# Patient Record
Sex: Male | Born: 1960
Health system: Southern US, Community
[De-identification: ages and names within clinical notes are randomized; demographics above are authoritative.]

## PROBLEM LIST (undated history)

## (undated) DIAGNOSIS — IMO0001 Reserved for inherently not codable concepts without codable children: Secondary | ICD-10-CM

## (undated) DIAGNOSIS — F419 Anxiety disorder, unspecified: Secondary | ICD-10-CM

## (undated) DIAGNOSIS — Z21 Asymptomatic human immunodeficiency virus [HIV] infection status: Secondary | ICD-10-CM

## (undated) DIAGNOSIS — R0600 Dyspnea, unspecified: Secondary | ICD-10-CM

## (undated) DIAGNOSIS — D649 Anemia, unspecified: Secondary | ICD-10-CM

## (undated) DIAGNOSIS — F32A Depression, unspecified: Secondary | ICD-10-CM

## (undated) DIAGNOSIS — W3400XA Accidental discharge from unspecified firearms or gun, initial encounter: Secondary | ICD-10-CM

## (undated) DIAGNOSIS — S7291XA Unspecified fracture of right femur, initial encounter for closed fracture: Secondary | ICD-10-CM

## (undated) DIAGNOSIS — L03039 Cellulitis of unspecified toe: Secondary | ICD-10-CM

## (undated) DIAGNOSIS — G009 Bacterial meningitis, unspecified: Secondary | ICD-10-CM

## (undated) DIAGNOSIS — I639 Cerebral infarction, unspecified: Secondary | ICD-10-CM

## (undated) DIAGNOSIS — I38 Endocarditis, valve unspecified: Secondary | ICD-10-CM

## (undated) DIAGNOSIS — B20 Human immunodeficiency virus [HIV] disease: Secondary | ICD-10-CM

## (undated) DIAGNOSIS — A4902 Methicillin resistant Staphylococcus aureus infection, unspecified site: Secondary | ICD-10-CM

## (undated) DIAGNOSIS — R05 Cough: Secondary | ICD-10-CM

## (undated) DIAGNOSIS — R21 Rash and other nonspecific skin eruption: Secondary | ICD-10-CM

## (undated) DIAGNOSIS — L02619 Cutaneous abscess of unspecified foot: Secondary | ICD-10-CM

## (undated) HISTORY — PX: FRACTURE SURGERY: SHX138

## (undated) HISTORY — PX: LEG SURGERY: SHX1003

## (undated) HISTORY — DX: Rash and other nonspecific skin eruption: R21

## (undated) HISTORY — DX: Cough: R05

## (undated) HISTORY — DX: Methicillin resistant Staphylococcus aureus infection, unspecified site: A49.02

## (undated) HISTORY — DX: Reserved for inherently not codable concepts without codable children: IMO0001

---

## 2007-08-06 LAB — CONVERTED CEMR LAB
CD4 Count: 1882 microliters
CD4 T Helper %: 36.9 %

## 2008-02-14 LAB — CONVERTED CEMR LAB
CD4 T Helper %: 1494 %
HIV 1 RNA Quant: <48 copies/mL
Platelets: 207 10*3/uL
WBC: 7.7 10*3/uL

## 2008-07-31 LAB — CONVERTED CEMR LAB: Platelets: 243 10*3/uL

## 2008-08-25 ENCOUNTER — Ambulatory Visit: Payer: Self-pay | Admitting: Infectious Disease

## 2008-08-25 DIAGNOSIS — B009 Herpesviral infection, unspecified: Secondary | ICD-10-CM

## 2008-08-25 DIAGNOSIS — B20 Human immunodeficiency virus [HIV] disease: Secondary | ICD-10-CM

## 2008-08-25 LAB — CONVERTED CEMR LAB
AST: 28 units/L (ref 0–37)
Albumin: 4.4 g/dL (ref 3.5–5.2)
Alkaline Phosphatase: 62 units/L (ref 39–117)
BUN: 16 mg/dL (ref 6–23)
Bilirubin Urine: NEGATIVE
Eosinophils Absolute: 0.1 10*3/uL (ref 0.0–0.7)
Eosinophils Relative: 2 % (ref 0–5)
GC Probe Amp, Urine: NEGATIVE
HCT: 39.7 % (ref 39.0–52.0)
HDL: 34 mg/dL — ABNORMAL LOW (ref >39)
HIV 1 RNA Quant: 135 copies/mL — ABNORMAL HIGH (ref <48)
HIV-1 antibody: POSITIVE — AB
HIV-2 Ab: UNDETERMINED — AB
Hemoglobin: 13.6 g/dL (ref 13.0–17.0)
Hepatitis B Surface Ag: NEGATIVE
LDL Cholesterol: 9 mg/dL (ref 0–99)
Lymphocytes Relative: 45 % (ref 12–46)
Lymphs Abs: 3.5 10*3/uL (ref 0.7–4.0)
MCV: 104.5 fL — ABNORMAL HIGH (ref 78.0–100.0)
Monocytes Absolute: 0.4 10*3/uL (ref 0.1–1.0)
Nitrite: NEGATIVE
Platelets: 287 10*3/uL (ref 150–400)
Potassium: 4.4 meq/L (ref 3.5–5.3)
Protein, ur: NEGATIVE mg/dL
Sodium: 143 meq/L (ref 135–145)
Specific Gravity, Urine: 1.03 (ref 1.005–1.030)
Total Bilirubin: 0.4 mg/dL (ref 0.3–1.2)
Total CHOL/HDL Ratio: 2.8
Total Protein: 6.9 g/dL (ref 6.0–8.3)
Triglycerides: 259 mg/dL — ABNORMAL HIGH (ref <150)
Urobilinogen, UA: 0.2 (ref 0.0–1.0)
VLDL: 52 mg/dL — ABNORMAL HIGH (ref 0–40)
WBC: 7.8 10*3/uL (ref 4.0–10.5)

## 2008-09-23 ENCOUNTER — Encounter (INDEPENDENT_AMBULATORY_CARE_PROVIDER_SITE_OTHER): Payer: Self-pay | Admitting: *Deleted

## 2008-09-29 ENCOUNTER — Ambulatory Visit: Payer: Self-pay | Admitting: Infectious Disease

## 2008-09-29 DIAGNOSIS — F17200 Nicotine dependence, unspecified, uncomplicated: Secondary | ICD-10-CM | POA: Insufficient documentation

## 2008-09-29 LAB — CONVERTED CEMR LAB
AST: 23 units/L (ref 0–37)
Albumin: 4.6 g/dL (ref 3.5–5.2)
Alkaline Phosphatase: 57 units/L (ref 39–117)
Basophils Absolute: 0 10*3/uL (ref 0.0–0.1)
Basophils Relative: 0 % (ref 0–1)
Eosinophils Absolute: 0.1 10*3/uL (ref 0.0–0.7)
Glucose, Bld: 102 mg/dL — ABNORMAL HIGH (ref 70–99)
HIV 1 RNA Quant: 14500 copies/mL — ABNORMAL HIGH (ref <48)
HIV-1 RNA Quant, Log: 4.16 — ABNORMAL HIGH (ref <1.68)
Hemoglobin: 14.8 g/dL (ref 13.0–17.0)
MCHC: 32.6 g/dL (ref 30.0–36.0)
MCV: 103.7 fL — ABNORMAL HIGH (ref 78.0–100.0)
Monocytes Absolute: 0.7 10*3/uL (ref 0.1–1.0)
Monocytes Relative: 10 % (ref 3–12)
Neutrophils Relative %: 53 % (ref 43–77)
Potassium: 4 meq/L (ref 3.5–5.3)
RBC: 4.38 M/uL (ref 4.22–5.81)
RDW: 13.5 % (ref 11.5–15.5)
Sodium: 140 meq/L (ref 135–145)
Total Bilirubin: 0.3 mg/dL (ref 0.3–1.2)
Total CHOL/HDL Ratio: 2.2
Total Protein: 6.9 g/dL (ref 6.0–8.3)
VLDL: 10 mg/dL (ref 0–40)

## 2008-11-17 ENCOUNTER — Ambulatory Visit: Payer: Self-pay | Admitting: Infectious Disease

## 2008-11-17 LAB — CONVERTED CEMR LAB
ALT: 20 units/L (ref 0–53)
AST: 25 units/L (ref 0–37)
Albumin: 4.5 g/dL (ref 3.5–5.2)
Alkaline Phosphatase: 71 units/L (ref 39–117)
BUN: 21 mg/dL (ref 6–23)
Basophils Absolute: 0 10*3/uL (ref 0.0–0.1)
Basophils Relative: 0 % (ref 0–1)
CO2: 22 meq/L (ref 19–32)
Calcium: 9.3 mg/dL (ref 8.4–10.5)
Chloride: 105 meq/L (ref 96–112)
Creatinine, Ser: 1.01 mg/dL (ref 0.40–1.50)
Eosinophils Absolute: 0.1 10*3/uL (ref 0.0–0.7)
Eosinophils Relative: 1 % (ref 0–5)
Glucose, Bld: 87 mg/dL (ref 70–99)
HCT: 40.2 % (ref 39.0–52.0)
Hemoglobin: 14.5 g/dL (ref 13.0–17.0)
Lymphocytes Relative: 41 % (ref 12–46)
Lymphs Abs: 3.6 10*3/uL (ref 0.7–4.0)
MCHC: 36.1 g/dL — ABNORMAL HIGH (ref 30.0–36.0)
MCV: 94.1 fL (ref >=78.0)
Monocytes Absolute: 0.5 10*3/uL (ref 0.1–1.0)
Monocytes Relative: 6 % (ref 3–12)
Neutro Abs: 4.5 10*3/uL (ref 1.7–7.7)
Neutrophils Relative %: 52 % (ref 43–77)
Platelets: 233 10*3/uL (ref 150–400)
Potassium: 4 meq/L (ref 3.5–5.3)
RBC: 4.27 M/uL (ref 4.22–5.81)
RDW: 13.6 % (ref 11.5–15.5)
Sodium: 142 meq/L (ref 135–145)
Total Bilirubin: 0.3 mg/dL (ref 0.3–1.2)
Total Protein: 7.1 g/dL (ref 6.0–8.3)
WBC: 8.7 10*3/uL (ref 4.0–10.5)

## 2008-12-01 ENCOUNTER — Encounter: Payer: Self-pay | Admitting: Licensed Clinical Social Worker

## 2008-12-01 ENCOUNTER — Ambulatory Visit: Payer: Self-pay | Admitting: Infectious Disease

## 2008-12-01 DIAGNOSIS — IMO0001 Reserved for inherently not codable concepts without codable children: Secondary | ICD-10-CM

## 2008-12-01 HISTORY — DX: Reserved for inherently not codable concepts without codable children: IMO0001

## 2009-03-26 ENCOUNTER — Ambulatory Visit: Payer: Self-pay | Admitting: Infectious Disease

## 2009-03-27 ENCOUNTER — Encounter: Payer: Self-pay | Admitting: Infectious Disease

## 2009-03-27 LAB — CONVERTED CEMR LAB
AST: 17 units/L (ref 0–37)
BUN: 12 mg/dL (ref 6–23)
Basophils Relative: 0 % (ref 0–1)
CO2: 26 meq/L (ref 19–32)
Calcium: 9.2 mg/dL (ref 8.4–10.5)
Chloride: 108 meq/L (ref 96–112)
Cholesterol: 99 mg/dL (ref 0–200)
Creatinine, Ser: 1.01 mg/dL (ref 0.40–1.50)
Eosinophils Absolute: 0.1 10*3/uL (ref 0.0–0.7)
Eosinophils Relative: 1 % (ref 0–5)
HCT: 45.1 % (ref 39.0–52.0)
HDL: 43 mg/dL (ref >39)
Lymphs Abs: 3.8 10*3/uL (ref 0.7–4.0)
MCHC: 32.8 g/dL (ref 30.0–36.0)
MCV: 95.8 fL (ref >=78.0)
Platelets: 267 10*3/uL (ref 150–400)
Total Bilirubin: 0.3 mg/dL (ref 0.3–1.2)
Total CHOL/HDL Ratio: 2.3
VLDL: 52 mg/dL — ABNORMAL HIGH (ref 0–40)
WBC: 8.8 10*3/uL (ref 4.0–10.5)

## 2009-04-09 ENCOUNTER — Ambulatory Visit: Payer: Self-pay | Admitting: Infectious Disease

## 2009-04-22 ENCOUNTER — Encounter: Payer: Self-pay | Admitting: Infectious Disease

## 2009-05-27 ENCOUNTER — Encounter (INDEPENDENT_AMBULATORY_CARE_PROVIDER_SITE_OTHER): Payer: Self-pay | Admitting: *Deleted

## 2009-07-09 ENCOUNTER — Ambulatory Visit: Payer: Self-pay | Admitting: Infectious Disease

## 2009-07-09 LAB — CONVERTED CEMR LAB
BUN: 15 mg/dL (ref 6–23)
Basophils Relative: 0 % (ref 0–1)
CO2: 25 meq/L (ref 19–32)
Cholesterol: 113 mg/dL (ref 0–200)
Creatinine, Ser: 1.1 mg/dL (ref 0.40–1.50)
Eosinophils Absolute: 0.1 10*3/uL (ref 0.0–0.7)
Eosinophils Relative: 1 % (ref 0–5)
Glucose, Bld: 117 mg/dL — ABNORMAL HIGH (ref 70–99)
Hemoglobin: 14 g/dL (ref 13.0–17.0)
MCHC: 32.6 g/dL (ref 30.0–36.0)
MCV: 93.9 fL (ref 78.0–100.0)
Monocytes Absolute: 0.3 10*3/uL (ref 0.1–1.0)
Monocytes Relative: 3 % (ref 3–12)
RBC: 4.58 M/uL (ref 4.22–5.81)
Total Bilirubin: 0.2 mg/dL — ABNORMAL LOW (ref 0.3–1.2)
Total Protein: 6.6 g/dL (ref 6.0–8.3)
Triglycerides: 105 mg/dL (ref <150)
VLDL: 21 mg/dL (ref 0–40)

## 2009-07-20 ENCOUNTER — Ambulatory Visit: Payer: Self-pay | Admitting: Infectious Disease

## 2009-08-25 ENCOUNTER — Encounter (INDEPENDENT_AMBULATORY_CARE_PROVIDER_SITE_OTHER): Payer: Self-pay | Admitting: *Deleted

## 2009-10-19 ENCOUNTER — Telehealth (INDEPENDENT_AMBULATORY_CARE_PROVIDER_SITE_OTHER): Payer: Self-pay | Admitting: *Deleted

## 2009-12-08 ENCOUNTER — Ambulatory Visit: Payer: Self-pay | Admitting: Infectious Disease

## 2009-12-08 LAB — CONVERTED CEMR LAB
Alkaline Phosphatase: 82 units/L (ref 39–117)
BUN: 12 mg/dL (ref 6–23)
Creatinine, Ser: 0.87 mg/dL (ref 0.40–1.50)
Eosinophils Absolute: 0.1 10*3/uL (ref 0.0–0.7)
Eosinophils Relative: 1 % (ref 0–5)
Glucose, Bld: 103 mg/dL — ABNORMAL HIGH (ref 70–99)
HCT: 42.8 % (ref 39.0–52.0)
HDL: 53 mg/dL (ref >39)
LDL Cholesterol: 38 mg/dL (ref 0–99)
Lymphs Abs: 4 10*3/uL (ref 0.7–4.0)
MCV: 94.5 fL (ref 78.0–100.0)
Monocytes Relative: 6 % (ref 3–12)
Platelets: 272 10*3/uL (ref 150–400)
Sodium: 142 meq/L (ref 135–145)
Total Bilirubin: 0.3 mg/dL (ref 0.3–1.2)
Total CHOL/HDL Ratio: 2
Triglycerides: 75 mg/dL (ref <150)
VLDL: 15 mg/dL (ref 0–40)
WBC: 10.8 10*3/uL — ABNORMAL HIGH (ref 4.0–10.5)

## 2009-12-22 ENCOUNTER — Encounter: Payer: Self-pay | Admitting: Infectious Disease

## 2009-12-22 ENCOUNTER — Ambulatory Visit: Payer: Self-pay | Admitting: Internal Medicine

## 2010-04-06 NOTE — Letter (Signed)
Summary: Juanell Fairly: Income Verification  Juanell Fairly: Income Verification   Imported By: Florinda Marker 05/01/2009 16:07:44  _____________________________________________________________________  External Attachment:    Type:   Image     Comment:   External Document

## 2010-04-06 NOTE — Progress Notes (Signed)
Summary: [facility] can't reach pt to set up Aug delivery  Phone Note From Pharmacy   Caller: [facility] Summary of Call: received a phone call from [facility] trying to reach patient to set up their delivery for August medication.  The number we have in our system is the same number they have.  They said it just rings and you are unable to leave a message. Initial call taken by: Paulo Fruit  BS,CPht II,MPH,  October 19, 2009 12:17 PM

## 2010-04-06 NOTE — Assessment & Plan Note (Signed)
Summary: 3 MONTH RECHECK/CH   Visit Type:  Follow-up Primary Provider:  Paulette Blanch Dam MD  CC:  f/u.  History of Present Illness: 50 year old AA former prisoner, with HIV (dx in 1997)  perfectly suppressed on atripla and with healthy CD4 count. He denies being sexually active recently. 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 He denies any physical problems and feels his mood is "good" too. He is down to 7 cigarettes per day. He is currently living with his mother.   Problems Prior to Update: 1)  Inadequate Material Resources  (ICD-V60.2) 2)  Legal Circumstance  (ICD-V62.5) 3)  Encounter For Long-term Use of Other Medications  (ICD-V58.69) 4)  Smoker  (ICD-305.1) 5)  Hsv  (ICD-054.9) 6)  HIV Infection  (ICD-042)  Medications Prior to Update: 1)  Atripla 600-200-300 Mg Tabs (Efavirenz-Emtricitab-Tenofovir) .... Take 1 Tablet By Mouth Once A Day  Current Medications (verified): 1)  Atripla 600-200-300 Mg Tabs (Efavirenz-Emtricitab-Tenofovir) .... Take 1 Tablet By Mouth Once A Day  Allergies (verified): No Known Drug Allergies   Preventive Screening-Counseling & Management  Alcohol-Tobacco     Alcohol drinks/day: 1     Smoking Status: current     Packs/Day: 0.25      Drug Use:  No.     Current Allergies (reviewed today): No known allergies  Past History:  Past Medical History: Last updated: 10/26/08 HIV without AIDS per pt dx in 1997 started sustiva, combivir in 1999 HSV  Past Surgical History: Last updated: 10/26/2008 none  Family History: Last updated: 2008/10/26 Dad passed died two years ago from a cancer (throat) Mom healthy in 38s One brother and 3 sisters all healthy  Social History: Last updated: 04/09/2009 smoker 5 cigarettes per day, occ alcohol. Was incarcerated for murder (he claims was in self defense)  Risk Factors: Alcohol Use: 1 (07/20/2009) Caffeine Use: sodas (04/09/2009) Exercise: yes  (04/09/2009)  Risk Factors: Smoking Status: current (07/20/2009) Packs/Day: 0.25 (07/20/2009)  Review of Systems  The patient denies anorexia, fever, weight loss, weight gain, vision loss, decreased hearing, hoarseness, chest pain, syncope, dyspnea on exertion, peripheral edema, prolonged cough, headaches, hemoptysis, abdominal pain, melena, hematochezia, severe indigestion/heartburn, hematuria, incontinence, genital sores, muscle weakness, suspicious skin lesions, transient blindness, difficulty walking, depression, unusual weight change, abnormal bleeding, and enlarged lymph nodes.    Vital Signs:  Patient profile:   50 year old male Height:      67 inches (170.18 cm) Weight:      143 pounds (65 kg) BMI:     22.48 Temp:     98.5 degrees F (36.94 degrees C) oral BP sitting:   127 / 89  (left arm)  Vitals Entered By: Starleen Arms CMA (Jul 20, 2009 9:19 AM) CC: f/u Is Patient Diabetic? No Pain Assessment Patient in pain? no      Nutritional Status BMI of 19 -24 = normal Nutritional Status Detail nl  Does patient need assistance? Functional Status Self care Ambulation Normal   Physical Exam  General:  alert and well-developed.  well-nourished.   Head:  normocephalic and atraumatic.   Eyes:  vision grossly intact, pupils equal, and pupils round.   Ears:  no external deformities.   Nose:  no external deformity.  no external erythema.   Mouth:  pharynx pink and moist, no erythema, and no exudates.   Neck:  supple and full ROM.   Lungs:  normal respiratory effort, no crackles,  and no wheezes.   Heart:  normal rate, regular rhythm, no murmur, and no gallop.   Abdomen:  soft, non-tender, normal bowel sounds, no hepatomegaly, and no splenomegaly.   Extremities:  No clubbing, cyanosis, edema, or deformity noted with normal full range of motion of all joints.   Neurologic:  alert & oriented X3.  strength normal in all extremities and gait normal.   Skin:  no rashes and no  ecchymoses.   Psych:  Oriented X3Oriented X3, memory intact for recent and remote, normally interactive, and good eye contact.          Medication Adherence: 07/20/2009   Adherence to medications reviewed with patient. Counseling to provide adequate adherence provided   Prevention For Positives: 07/20/2009   Safe sex practices discussed with patient. Condoms offered.   Education Materials Provided: 07/20/2009 Safe sex practices discussed with patient. Condoms offered.                          Impression & Recommendations:  Problem # 1:  HIV INFECTION (ICD-042) Assessment Improved  Superb control! Orders: Est. Patient Level IV (99214)Future Orders: T-HIV Viral Load ([id number]) ... 11/09/2009 T-CBC w/Diff ([id number]) ... 11/09/2009 T-Comprehensive Metabolic Panel [id number]) ... 11/09/2009 T-Lipid Profile [phone]) ... 11/09/2009 T-RPR (Syphilis) [id number]) ... 11/09/2009 T-CD4SP ([facility]) (CD4SP) ... 11/09/2009  Diagnostics Reviewed:  HIV: CDC-defined AIDS (04/09/2009)   HIV-Western blot: Positive (08/25/2008)   CD4: 910 (07/09/2009)   WBC: 10.2 (07/09/2009)   Hgb: 14.0 (07/09/2009)   HCT: 43.0 (07/09/2009)   Platelets: 252 (07/09/2009) HIV-1 RNA: <48 copies/mL (07/09/2009)   HBSAg: NEG (08/25/2008)  Problem # 2:  INADEQUATE MATERIAL RESOURCES (ICD-V60.2)  referred to case management for continued support  Orders: Est. Patient Level IV (02725)  Problem # 3:  SMOKER (ICD-305.1)  still working on cessation  Orders: Est. Patient Level IV (36644)  Problem # 4:  HSV (ICD-054.9)  has not flared recently  Orders: Est. Patient Level IV (03474)  Patient Instructions: 1)  Please make appt to meet with one of our case managers 2)  Make sure that you are plugged in with Byrd Hesselbach as well (very important) 3)  Rtc to see Dr. Daiva Eves in 4 m onths    PPD Skin Test (to be given today)

## 2010-04-06 NOTE — Assessment & Plan Note (Signed)
Summary: F/U [MKJ]   Visit Type:  Follow-up Primary Christopher Farrell:  Christopher Blanch Dam MD  CC:  6 month follow up.  History of Present Illness: 50year old AA former prisoner, with HIV (dx in 1997)  perfectly suppressed on atripla and with healthy CD4 count. He is sexually active with girlfriend how is HIV negative and who knows his status. He claims to use condoms with all forms of sexual activity. He is smoking 5 cigarettes per day.  He is currently living with his mother. Pt has new problem. 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 SInce then he has had sever pain up to 10/10 when standing with a lump in his lower abdomen.  Problems Prior to Update: 1)  Inadequate Material Resources  (ICD-V60.2) 2)  Legal Circumstance  (ICD-V62.5) 3)  Encounter For Long-term Use of Other Medications  (ICD-V58.69) 4)  Smoker  (ICD-305.1) 5)  Hsv  (ICD-054.9) 6)  HIV Infection  (ICD-042)  Medications Prior to Update: 1)  Atripla 600-200-300 Mg Tabs (Efavirenz-Emtricitab-Tenofovir) .... Take 1 Tablet By Mouth Once A Day  Current Medications (verified): 1)  Atripla 600-200-300 Mg Tabs (Efavirenz-Emtricitab-Tenofovir) .... Take 1 Tablet By Mouth Once A Day 2)  Vicodin 5-500 Mg Tabs (Hydrocodone-Acetaminophen) .... Take One Tablet Up To Twice Daily As Needed For Pain  Allergies (verified): No Known Drug Allergies   Preventive Screening-Counseling & Management  Alcohol-Tobacco     Alcohol drinks/day: ocassionally     Smoking Status: current     Packs/Day: <0.25  Caffeine-Diet-Exercise     Caffeine use/day: sodas     Does Patient Exercise: yes     Type of exercise: lifting weights     Exercise (avg: min/session): >60     Times/week: 3  Safety-Violence-Falls     Seat Belt Use: yes   Current Allergies (reviewed today): No known allergies  Past History:  Past Medical History: Last updated: 2008/09/30 HIV without AIDS per pt dx in 1997  started sustiva, combivir in 1999 HSV  Past Surgical History: Last updated: 09/30/08 none  Family History: Last updated: 09/30/08 Dad passed died two years ago from a cancer (throat) Mom healthy in 78s One brother and 3 sisters all healthy  Social History: Last updated: 04/09/2009 smoker 5 cigarettes per day, occ alcohol. Was incarcerated for murder (he claims was in self defense)  Risk Factors: Alcohol Use: ocassionally (12/22/2009) Caffeine Use: sodas (12/22/2009) Exercise: yes (12/22/2009)  Risk Factors: Smoking Status: current (12/22/2009) Packs/Day: <0.25 (12/22/2009)  Family History: Reviewed history from 2008-09-30 and no changes required. Dad passed died two years ago from a cancer (throat) Mom healthy in 33s One brother and 3 sisters all healthy  Social History: Reviewed history from 04/09/2009 and no changes required. smoker 5 cigarettes per day, occ alcohol. Was incarcerated for murder (he claims was in self defense)  Review of Systems  The patient denies anorexia, fever, weight loss, weight gain, vision loss, decreased hearing, hoarseness, chest pain, syncope, dyspnea on exertion, peripheral edema, prolonged cough, headaches, hemoptysis, abdominal pain, melena, hematochezia, severe indigestion/heartburn, hematuria, incontinence, genital sores, muscle weakness, suspicious skin lesions, transient blindness, difficulty walking, depression, unusual weight change, abnormal bleeding, and enlarged lymph nodes.    Vital Signs:  Patient profile:   50 year old male Height:      67 inches (170.18 cm) Weight:      133.0 pounds (60.45 kg) BMI:     20.91 Temp:  98.4 degrees F (36.89 degrees C) oral Pulse rate:   93 / minute BP sitting:   123 / 80  (right arm)  Vitals Entered By: Baxter Hire) (December 22, 2009 8:36 AM) CC: 6 month follow up Pain Assessment Patient in pain? no      Nutritional Status BMI of 19 -24 = normal Nutritional Status Detail  appetite is so-so per patient  Have you ever been in a relationship where you felt threatened, hurt or afraid?No   Does patient need assistance? Functional Status Self care Ambulation Normal   Physical Exam  General:  alert and well-developed.  well-nourished.   Head:  normocephalic and atraumatic.   Eyes:  vision grossly intact, pupils equal, and pupils round.   Ears:  no external deformities.   Nose:  no external deformity.  no external erythema.   Mouth:  pharynx pink and moist, no erythema, and no exudates.   Neck:  supple and full ROM.   Lungs:  normal respiratory effort, no crackles, and no wheezes.   Heart:  normal rate, regular rhythm, no murmur, and no gallop.   Abdomen:  soft, he has reducible hernia in left inguinal area that is tender to palption when he stands Genitalia:  circumcised and no cutaneous lesions.   Msk:  No deformity or scoliosis noted of thoracic or lumbar spine.   Extremities:  No clubbing, cyanosis, edema, or deformity noted with normal full range of motion of all joints.   Neurologic:  alert & oriented X3.  strength normal in all extremities and gait normal.   Skin:  no rashes and no ecchymoses.  color normal and no rashes.   Psych:  Oriented X3, memory intact for recent and remote, and normally interactive.          Medication Adherence: 12/21/2009   Adherence to medications reviewed with patient. Counseling to provide adequate adherence provided                                Impression & Recommendations:  Problem # 1:  HIV INFECTION (ICD-042) Excellent control! Orders: T-GC Probe, urine [id number]) T-Chlamydia  Probe, urine ([id number] Orders: T-CD4SP ([facility]) (CD4SP) ... 06/20/2010 T-HIV Viral Load [id number]) ... 06/20/2010 T-CBC w/Diff ([id number]) ... 06/20/2010 T-Comprehensive Metabolic Panel [id number]) ... 06/20/2010 T-Lipid Profile [id number]) ... 06/20/2010 T-RPR (Syphilis) [id number]) ...  06/20/2010  Diagnostics Reviewed:  HIV: CDC-defined AIDS (04/09/2009)   HIV-Western blot: Positive (08/25/2008)   CD4: 1380 (12/10/2009)   WBC: 10.8 (12/08/2009)   Hgb: 14.3 (12/08/2009)   HCT: 42.8 (12/08/2009)   Platelets: 272 (12/08/2009) HIV-1 RNA: <20 copies/mL (12/08/2009)   HBSAg: NEG (08/25/2008)  Problem # 2:  SMOKER (ICD-305.1) still cutting down on cigarettes  Problem # 3:  HSV (ICD-054.9) not active  Problem # 4:  INGUINAL HERNIA, LEFT (ICD-550.90) referring to [facility] and gave vicodin for pain if becomes severe Orders: Surgical Referral (Surgery) Est. Patient Level IV (33295)  Medications Added to Medication List This Visit: 1)  Vicodin 5-500 Mg Tabs (Hydrocodone-acetaminophen) .... Take one tablet up to twice daily as needed for pain  Patient Instructions: 1)  Please schedule a follow-up appointment in 6 months. 2)  Advised not to eat any food or drink any liquids after 10 PM the night before procedure. 3)  Be sure to return for lab work one (1) week before your next appointment as scheduled.    PPD Skin Test (to be given  today) Prescriptions: VICODIN 5-500 MG TABS (HYDROCODONE-ACETAMINOPHEN) take one tablet up to twice daily as needed for pain  #60 x 0   Entered and Authorized by:   Acey Lav MD   Signed by:   Christopher Blanch Dam MD on 12/22/2009   Method used:   Print then Give to Patient   RxID:   [id number]

## 2010-04-06 NOTE — Miscellaneous (Signed)
Summary: clinical update/ryan white  Clinical Lists Changes  Observations: Added new observation of PCTFPL: 90.75  (08/25/2009 11:20) Added new observation of INCOMESOURCE: wages  (08/25/2009 11:20) Added new observation of HOUSEINCOME: 9828  (08/25/2009 11:20) Added new observation of FINASSESSDT: 08/25/2009  (08/25/2009 11:20)

## 2010-04-06 NOTE — Miscellaneous (Signed)
Summary: Orders Update  Clinical Lists Changes  Orders: Added new Test order of T-CD4SP ([facility]) (CD4SP) - Signed 

## 2010-04-06 NOTE — Miscellaneous (Signed)
Summary: clinical update/ryan white NCADAP appr til 06/05/10  Clinical Lists Changes  Observations: Added new observation of AIDSDAP: Yes 2011 (05/27/2009 9:12)

## 2010-04-06 NOTE — Assessment & Plan Note (Signed)
Summary: EST-CK/FU/MEDS/CFB   CC:  f/u labs.  History of Present Illness: 50 year old AA former prisoner, with HIV (dx in 1997) currently relatively well suppressed on atripla and with healthy CD4 count. He endorses being occasionally sexually active 1.5 months ago but claims to have informed recent partner and to have used condoms with intercourse. He is currently living with his mother. He is seeking employment. IHe denies depression or any other significant symptoms.  Problems Prior to Update: 1)  Legal Circumstance  (ICD-V62.5) 2)  Encounter For Long-term Use of Other Medications  (ICD-V58.69) 3)  Smoker  (ICD-305.1) 4)  Hsv  (ICD-054.9) 5)  HIV Infection  (ICD-042)  Medications Prior to Update: 1)  Atripla 600-200-300 Mg Tabs (Efavirenz-Emtricitab-Tenofovir) .... Take 1 Tablet By Mouth Once A Day  Current Medications (verified): 1)  Atripla 600-200-300 Mg Tabs (Efavirenz-Emtricitab-Tenofovir) .... Take 1 Tablet By Mouth Once A Day  Allergies (verified): No Known Drug Allergies   Preventive Screening-Counseling & Management  Alcohol-Tobacco     Alcohol drinks/day: 0     Smoking Status: current     Packs/Day: 0.25  Caffeine-Diet-Exercise     Caffeine use/day: sodas     Does Patient Exercise: yes     Type of exercise: lifting weights     Exercise (avg: min/session): >60     Times/week: 3      Drug Use:  No.     Current Allergies (reviewed today): No known allergies  Past History:  Past Medical History: Last updated: 10/08/08 HIV without AIDS per pt dx in 1997 started sustiva, combivir in 1999 HSV  Past Surgical History: Last updated: 2008-10-08 none  Family History: Last updated: 10-08-08 Dad passed died two years ago from a cancer (throat) Mom healthy in 32s One brother and 3 sisters all healthy  Social History: Last updated: 04/09/2009 smoker 5 cigarettes per day, occ alcohol. Was incarcerated for murder (he claims was in self defense)  Risk  Factors: Alcohol Use: 0 (04/09/2009) Caffeine Use: sodas (04/09/2009) Exercise: yes (04/09/2009)  Risk Factors: Smoking Status: current (04/09/2009) Packs/Day: 0.25 (04/09/2009)  Family History: Reviewed history from Oct 08, 2008 and no changes required. Dad passed died two years ago from a cancer (throat) Mom healthy in 61s One brother and 3 sisters all healthy  Social History: Reviewed history from 10/08/08 and no changes required. smoker 5 cigarettes per day, occ alcohol. Was incarcerated for murder (he claims was in self defense)  Review of Systems  The patient denies anorexia, fever, weight loss, weight gain, vision loss, decreased hearing, hoarseness, chest pain, syncope, dyspnea on exertion, peripheral edema, prolonged cough, headaches, hemoptysis, abdominal pain, melena, hematochezia, severe indigestion/heartburn, hematuria, incontinence, genital sores, muscle weakness, suspicious skin lesions, transient blindness, difficulty walking, depression, unusual weight change, abnormal bleeding, enlarged lymph nodes, angioedema, and breast masses.    Vital Signs:  Patient profile:   50 year old male Height:      67 inches (170.18 cm) Weight:      150 pounds (68.18 kg) BMI:     23.58 Temp:     98.3 degrees F (36.83 degrees C) oral Pulse rate:   79 / minute BP sitting:   122 / 87  (left arm)  Vitals Entered By: Starleen Arms CMA (April 09, 2009 3:50 PM) CC: f/u labs Is Patient Diabetic? No Pain Assessment Patient in pain? no      Nutritional Status BMI of 19 -24 = normal Nutritional Status Detail nl  Does patient need assistance? Functional Status  Self care Ambulation Normal   Physical Exam  General:  alert and well-developed.   Head:  normocephalic and atraumatic.   Eyes:  vision grossly intact, pupils equal, and pupils round.   Ears:  no external deformities.   Nose:  no external deformity.   Mouth:  pharynx pink and moist, no erythema, and no exudates.    Neck:  supple and full ROM.   Lungs:  normal respiratory effort, no crackles, and no wheezes.   Heart:  normal rate, regular rhythm, no murmur, and no gallop.   Abdomen:  soft, non-tender, normal bowel sounds, no hepatomegaly, and no splenomegaly.   Msk:  normal ROM.  no joint deformities.   Extremities:  No clubbing, cyanosis, edema, or deformity noted with normal full range of motion of all joints.   Neurologic:  alert & oriented X3.  strength normal in all extremities and gait normal.   Skin:  no rashes and no ecchymoses.   Psych:  Oriented X3Oriented X3, memory intact for recent and remote, normally interactive, and good eye contact.          Medication Adherence: 04/09/2009   Adherence to medications reviewed with patient. Counseling to provide adequate adherence provided   Prevention For Positives: 04/09/2009   Safe sex practices discussed with patient. Condoms offered.   Education Materials Provided: 04/09/2009 Safe sex practices discussed with patient. Condoms offered.                          Impression & Recommendations:  Problem # 1:  HIV INFECTION (ICD-042) Assessment Comment Only Fairly good virological suppression, emphasized need for strict adherence and issues of sustivas low threshold to resistance if compliance became a problem Orders: Est. Patient Level IV (99214)Future Orders: T-CD4SP ([facility]) (CD4SP) ... 06/08/2009 T-HIV Viral Load [id number]) ... 06/08/2009 T-CBC w/Diff ([id number]) ... 06/08/2009 T-Comprehensive Metabolic Panel [id number]) ... 06/08/2009  Problem # 2:  SMOKER (ICD-305.1) Assessment: Comment Only working on quitting, counselled gain to quit. Orders: Est. Patient Level IV (99214)Future Orders: T-Lipid Profile ([id number]) ... 06/08/2009  Problem # 3:  HSV (ICD-054.9) Assessment: Comment Only  Not active at present  Orders: Est. Patient Level IV (28413)  Problem # 4:  INADEQUATE MATERIAL RESOURCES  (ICD-V60.2)  Referred to Jamison Neighbor again for assistance. Currently out of work again.  Orders: Est. Patient Level IV (24401)  Other Orders: Hepatitis B Vaccine >20yrs (02725) Admin 1st Vaccine (36644) Pneumococcal Vaccine (03474) Admin of Any Addtl Vaccine (25956)  Patient Instructions: 1)  fu appt in 2-3 months  2)  come in fasting for labs    Immunizations Administered:  Hepatitis B Vaccine # 3:    Vaccine Type: HepB Adult    Site: left deltoid    Mfr: Merck    Dose: 0.5 ml    Route: IM    Given by: Starleen Arms CMA    Exp. Date: 02/16/2012    Lot #: [id number]    VIS given: 09/21/05 version given April 09, 2009.  Pneumonia Vaccine:    Vaccine Type: Pneumovax    Site: left deltoid    Mfr: Merck    Dose: 0.5 ml    Route: IM    Given by: Starleen Arms CMA    Exp. Date: 06/25/2010    Lot #: [id number]    VIS given: 10/03/95 version given April 09, 2009.  PPD Skin Test (to be given today) Process Orders Check Orders Results:  [facility]: ABN not required for this insurance Tests Sent for requisitioning (April 10, 2009 9:12 AM):     06/08/2009: [facility] -- T-HIV Viral Load [id number] (signed)     06/08/2009: [facility] -- T-CBC w/Diff [[id number]] (signed)     06/08/2009: [facility] -- T-Lipid Profile [id number] (signed)     06/08/2009: [facility] -- T-Comprehensive Metabolic Panel [phone] (signed)

## 2010-04-21 ENCOUNTER — Encounter (INDEPENDENT_AMBULATORY_CARE_PROVIDER_SITE_OTHER): Payer: Self-pay | Admitting: *Deleted

## 2010-04-28 NOTE — Miscellaneous (Signed)
Summary: ADAP Update   Clinical Lists Changes  Observations: Added new observation of AIDSDAP: Pending-approval 2012 (04/21/2010 11:23)

## 2010-05-11 ENCOUNTER — Encounter (INDEPENDENT_AMBULATORY_CARE_PROVIDER_SITE_OTHER): Payer: Self-pay | Admitting: *Deleted

## 2010-05-18 NOTE — Miscellaneous (Signed)
Summary: ADAP APPROVED TIL 12/05/10  Clinical Lists Changes  Observations: Added new observation of FINASSESSDT: 04/13/2009 (05/11/2010 17:19) Added new observation of PCTFPL: 59.70  (05/11/2010 17:19) Added new observation of HOUSEINCOME: 6465  (05/11/2010 17:19) Added new observation of AIDSDAP: Yes 2012  (05/11/2010 17:19)

## 2010-05-20 LAB — T-HELPER CELL (CD4) - (RCID CLINIC ONLY): CD4 % Helper T Cell: 38 % (ref 33–55)

## 2010-05-23 LAB — T-HELPER CELL (CD4) - (RCID CLINIC ONLY): CD4 T Cell Abs: 1090 uL (ref 400–2700)

## 2010-05-25 LAB — T-HELPER CELL (CD4) - (RCID CLINIC ONLY)
CD4 % Helper T Cell: 31 % — ABNORMAL LOW (ref 33–55)
CD4 T Cell Abs: 910 uL (ref 400–2700)

## 2010-06-03 ENCOUNTER — Other Ambulatory Visit: Payer: Self-pay | Admitting: *Deleted

## 2010-06-03 DIAGNOSIS — B20 Human immunodeficiency virus [HIV] disease: Secondary | ICD-10-CM

## 2010-06-03 MED ORDER — EFAVIRENZ-EMTRICITAB-TENOFOVIR 600-200-300 MG PO TABS: 1.0000 | ORAL_TABLET | Freq: Every day | ORAL | Status: DC

## 2010-06-11 LAB — T-HELPER CELL (CD4) - (RCID CLINIC ONLY): CD4 % Helper T Cell: 31 % — ABNORMAL LOW (ref 33–55)

## 2010-06-13 LAB — T-HELPER CELL (CD4) - (RCID CLINIC ONLY)
CD4 % Helper T Cell: 34 % (ref 33–55)
CD4 T Cell Abs: 840 uL (ref 400–2700)

## 2010-06-14 LAB — T-HELPER CELL (CD4) - (RCID CLINIC ONLY)
CD4 % Helper T Cell: 36 % (ref 33–55)
CD4 T Cell Abs: 1220 uL (ref 400–2700)

## 2010-10-20 DIAGNOSIS — Z79899 Other long term (current) drug therapy: Secondary | ICD-10-CM

## 2010-10-20 DIAGNOSIS — Z113 Encounter for screening for infections with a predominantly sexual mode of transmission: Secondary | ICD-10-CM

## 2010-10-20 DIAGNOSIS — B20 Human immunodeficiency virus [HIV] disease: Secondary | ICD-10-CM

## 2010-10-21 ENCOUNTER — Other Ambulatory Visit (INDEPENDENT_AMBULATORY_CARE_PROVIDER_SITE_OTHER): Payer: Self-pay

## 2010-10-21 DIAGNOSIS — Z79899 Other long term (current) drug therapy: Secondary | ICD-10-CM

## 2010-10-21 DIAGNOSIS — Z113 Encounter for screening for infections with a predominantly sexual mode of transmission: Secondary | ICD-10-CM

## 2010-10-21 DIAGNOSIS — B20 Human immunodeficiency virus [HIV] disease: Secondary | ICD-10-CM

## 2010-10-21 LAB — LIPID PANEL
Cholesterol: 116 mg/dL (ref 0–200)
HDL: 62 mg/dL (ref >39)
Triglycerides: 102 mg/dL (ref <150)

## 2010-10-21 LAB — CBC WITH DIFFERENTIAL/PLATELET
Eosinophils Absolute: 0.2 10*3/uL (ref 0.0–0.7)
Eosinophils Relative: 2 % (ref 0–5)
Hemoglobin: 13.7 g/dL (ref 13.0–17.0)
Lymphs Abs: 2.5 10*3/uL (ref 0.7–4.0)
MCH: 32.1 pg (ref 26.0–34.0)
MCHC: 34.3 g/dL (ref 30.0–36.0)
MCV: 93.7 fL (ref 78.0–100.0)
Monocytes Absolute: 0.6 10*3/uL (ref 0.1–1.0)
Monocytes Relative: 7 % (ref 3–12)
RBC: 4.27 MIL/uL (ref 4.22–5.81)

## 2010-10-21 LAB — RPR

## 2010-10-21 LAB — COMPLETE METABOLIC PANEL WITH GFR
BUN: 26 mg/dL — ABNORMAL HIGH (ref 6–23)
CO2: 27 mEq/L (ref 19–32)
Creat: 1.03 mg/dL (ref 0.50–1.35)
GFR, Est African American: >60 mL/min (ref >60)
GFR, Est Non African American: >60 mL/min (ref >60)
Glucose, Bld: 90 mg/dL (ref 70–99)
Total Bilirubin: 0.3 mg/dL (ref 0.3–1.2)
Total Protein: 6.5 g/dL (ref 6.0–8.3)

## 2010-10-22 LAB — T-HELPER CELL (CD4) - (RCID CLINIC ONLY): CD4 T Cell Abs: 1070 uL (ref 400–2700)

## 2010-10-25 ENCOUNTER — Ambulatory Visit: Payer: Self-pay

## 2010-11-01 ENCOUNTER — Ambulatory Visit: Payer: Self-pay

## 2010-11-03 ENCOUNTER — Encounter: Payer: Self-pay | Admitting: Infectious Disease

## 2010-11-03 ENCOUNTER — Ambulatory Visit (INDEPENDENT_AMBULATORY_CARE_PROVIDER_SITE_OTHER): Payer: Self-pay | Admitting: Infectious Disease

## 2010-11-03 VITALS — BP 109/71 | HR 123 | Temp 99.0°F | Wt 129.2 lb

## 2010-11-03 DIAGNOSIS — Z23 Encounter for immunization: Secondary | ICD-10-CM

## 2010-11-03 DIAGNOSIS — F17200 Nicotine dependence, unspecified, uncomplicated: Secondary | ICD-10-CM

## 2010-11-03 DIAGNOSIS — B20 Human immunodeficiency virus [HIV] disease: Secondary | ICD-10-CM

## 2010-11-03 NOTE — Progress Notes (Signed)
  Subjective:    Patient ID: Christopher Farrell, male    DOB: [birth year], 50 y.o.   MRN: [id number]  HPI  50 year old [place] American man with perfectly suppressed HIV undetecable viral load and cd4 >1000, returns for followup. He has absolutely no complaints. He is smoking one pack every 3 days. Emphasized smoking cessation.  Review of Systems  Constitutional: Negative for fever, chills, diaphoresis, activity change, appetite change, fatigue and unexpected weight change.  HENT: Negative for congestion, sore throat, rhinorrhea, sneezing, trouble swallowing and sinus pressure.   Eyes: Negative for photophobia and visual disturbance.  Respiratory: Negative for cough, chest tightness, shortness of breath, wheezing and stridor.   Cardiovascular: Negative for chest pain, palpitations and leg swelling.  Gastrointestinal: Negative for nausea, vomiting, abdominal pain, diarrhea, constipation, blood in stool, abdominal distention and anal bleeding.  Genitourinary: Negative for dysuria, hematuria, flank pain and difficulty urinating.  Musculoskeletal: Negative for myalgias, back pain, joint swelling, arthralgias and gait problem.  Skin: Negative for color change, pallor, rash and wound.  Neurological: Negative for dizziness, tremors, weakness and light-headedness.  Hematological: Negative for adenopathy. Does not bruise/bleed easily.  Psychiatric/Behavioral: Negative for behavioral problems, confusion, sleep disturbance, dysphoric mood, decreased concentration and agitation.       Objective:   Physical Exam  Constitutional: He is oriented to person, place, and time. He appears well-developed and well-nourished. No distress.  HENT:  Head: Normocephalic and atraumatic.  Mouth/Throat: Oropharynx is clear and moist. No oropharyngeal exudate.  Eyes: Conjunctivae and EOM are normal. Pupils are equal, round, and reactive to light. No scleral icterus.  Neck: Normal range of motion. Neck supple. No JVD  present.  Cardiovascular: Normal rate, regular rhythm and normal heart sounds.  Exam reveals no gallop and no friction rub.   No murmur heard. Pulmonary/Chest: Effort normal and breath sounds normal. No respiratory distress. He has no wheezes. He has no rales. He exhibits no tenderness.  Abdominal: He exhibits no distension and no mass. There is no tenderness. There is no rebound and no guarding.  Musculoskeletal: He exhibits no edema and no tenderness.  Lymphadenopathy:    He has no cervical adenopathy.  Neurological: He is alert and oriented to person, place, and time. He has normal reflexes. He exhibits normal muscle tone. Coordination normal.  Skin: Skin is warm and dry. He is not diaphoretic. No erythema. No pallor.  Psychiatric: He has a normal mood and affect. His behavior is normal. Judgment and thought content normal.          Assessment & Plan:

## 2010-11-03 NOTE — Patient Instructions (Signed)
Let Tamika know you want to be seen in Dental clinic

## 2010-11-03 NOTE — Assessment & Plan Note (Signed)
Perfect control 

## 2010-11-03 NOTE — Assessment & Plan Note (Signed)
Continue to work on cessation, suggested electronic cigarettes nicotine patch

## 2011-01-12 ENCOUNTER — Other Ambulatory Visit: Payer: Self-pay | Admitting: *Deleted

## 2011-01-12 DIAGNOSIS — B20 Human immunodeficiency virus [HIV] disease: Secondary | ICD-10-CM

## 2011-01-12 MED ORDER — EFAVIRENZ-EMTRICITAB-TENOFOVIR 600-200-300 MG PO TABS: 1.0000 | ORAL_TABLET | Freq: Every day | ORAL | Status: DC

## 2011-05-09 ENCOUNTER — Ambulatory Visit: Payer: Self-pay

## 2011-05-11 ENCOUNTER — Ambulatory Visit: Payer: Self-pay

## 2011-08-22 ENCOUNTER — Telehealth: Payer: Self-pay | Admitting: *Deleted

## 2011-08-22 NOTE — Telephone Encounter (Signed)
I left a message on his phone asking him to call & make an appt

## 2011-09-14 ENCOUNTER — Other Ambulatory Visit: Payer: Self-pay | Admitting: *Deleted

## 2011-09-14 DIAGNOSIS — B20 Human immunodeficiency virus [HIV] disease: Secondary | ICD-10-CM

## 2011-09-14 MED ORDER — EFAVIRENZ-EMTRICITAB-TENOFOVIR 600-200-300 MG PO TABS: 1.0000 | ORAL_TABLET | Freq: Every day | ORAL | Status: DC

## 2011-10-20 ENCOUNTER — Other Ambulatory Visit: Payer: Self-pay | Admitting: Infectious Disease

## 2011-10-20 DIAGNOSIS — Z79899 Other long term (current) drug therapy: Secondary | ICD-10-CM

## 2011-10-20 DIAGNOSIS — Z113 Encounter for screening for infections with a predominantly sexual mode of transmission: Secondary | ICD-10-CM

## 2011-10-31 ENCOUNTER — Other Ambulatory Visit (INDEPENDENT_AMBULATORY_CARE_PROVIDER_SITE_OTHER): Payer: Self-pay

## 2011-10-31 DIAGNOSIS — Z79899 Other long term (current) drug therapy: Secondary | ICD-10-CM

## 2011-10-31 DIAGNOSIS — B20 Human immunodeficiency virus [HIV] disease: Secondary | ICD-10-CM

## 2011-10-31 DIAGNOSIS — Z113 Encounter for screening for infections with a predominantly sexual mode of transmission: Secondary | ICD-10-CM

## 2011-10-31 LAB — CBC WITH DIFFERENTIAL/PLATELET
Basophils Absolute: 0.1 10*3/uL (ref 0.0–0.1)
Eosinophils Absolute: 0.2 10*3/uL (ref 0.0–0.7)
Eosinophils Relative: 3 % (ref 0–5)
HCT: 39.4 % (ref 39.0–52.0)
MCH: 31.9 pg (ref 26.0–34.0)
MCV: 89.1 fL (ref 78.0–100.0)
Monocytes Absolute: 0.6 10*3/uL (ref 0.1–1.0)
Platelets: 239 10*3/uL (ref 150–400)
RDW: 13.2 % (ref 11.5–15.5)

## 2011-10-31 LAB — COMPLETE METABOLIC PANEL WITH GFR
AST: 19 U/L (ref 0–37)
Albumin: 4.2 g/dL (ref 3.5–5.2)
BUN: 14 mg/dL (ref 6–23)
Calcium: 8.9 mg/dL (ref 8.4–10.5)
Chloride: 108 mEq/L (ref 96–112)
Creat: 0.86 mg/dL (ref 0.50–1.35)
GFR, Est Non African American: >89 mL/min
Glucose, Bld: 95 mg/dL (ref 70–99)

## 2011-10-31 LAB — LIPID PANEL
HDL: 59 mg/dL (ref >39)
LDL Cholesterol: 35 mg/dL (ref 0–99)

## 2011-11-01 LAB — HIV-1 RNA QUANT-NO REFLEX-BLD
HIV 1 RNA Quant: 41 copies/mL — ABNORMAL HIGH (ref <20)
HIV-1 RNA Quant, Log: 1.61 {Log} — ABNORMAL HIGH (ref <1.30)

## 2011-11-01 LAB — T-HELPER CELL (CD4) - (RCID CLINIC ONLY)
CD4 % Helper T Cell: 39 % (ref 33–55)
CD4 T Cell Abs: 1070 uL (ref 400–2700)

## 2011-11-14 ENCOUNTER — Ambulatory Visit (INDEPENDENT_AMBULATORY_CARE_PROVIDER_SITE_OTHER): Payer: Self-pay | Admitting: Infectious Disease

## 2011-11-14 ENCOUNTER — Encounter: Payer: Self-pay | Admitting: Infectious Disease

## 2011-11-14 ENCOUNTER — Ambulatory Visit: Payer: Self-pay

## 2011-11-14 VITALS — BP 110/76 | HR 73 | Temp 98.1°F | Ht 66.0 in | Wt 128.0 lb

## 2011-11-14 DIAGNOSIS — F17200 Nicotine dependence, unspecified, uncomplicated: Secondary | ICD-10-CM

## 2011-11-14 DIAGNOSIS — Z21 Asymptomatic human immunodeficiency virus [HIV] infection status: Secondary | ICD-10-CM

## 2011-11-14 DIAGNOSIS — B20 Human immunodeficiency virus [HIV] disease: Secondary | ICD-10-CM

## 2011-11-14 MED ORDER — EFAVIRENZ-EMTRICITAB-TENOFOVIR 600-200-300 MG PO TABS: 1.0000 | ORAL_TABLET | Freq: Every day | ORAL | Status: DC

## 2011-11-14 MED ORDER — VARENICLINE TARTRATE 1 MG PO TABS: 1.0000 mg | ORAL_TABLET | Freq: Two times a day (BID) | ORAL | Status: DC

## 2011-11-14 MED ORDER — VARENICLINE TARTRATE 0.5 MG X 11 & 1 MG X 42 PO MISC: ORAL | Status: DC

## 2011-11-14 NOTE — Progress Notes (Signed)
  Subjective:    Patient ID: Christopher Farrell, male    DOB: [birth year], 51 y.o.   MRN: [id number]  HPI  Christopher Farrell is a 51 y.o. male who is doing superbly well on his  antiviral regimen, atripla  with nearly undetectable viral load,-40 and health cd4 count. He is still smoking a half a pack per day and is going to try Chantix to help him with his smoking cessation. He has had a fair amount of stress related to his nephew's legal problems. Otherwise he is doing well.   Review of Systems  Constitutional: Negative for fever, chills, diaphoresis, activity change, appetite change, fatigue and unexpected weight change.  HENT: Negative for congestion, sore throat, rhinorrhea, sneezing, trouble swallowing and sinus pressure.   Eyes: Negative for photophobia and visual disturbance.  Respiratory: Negative for cough, chest tightness, shortness of breath, wheezing and stridor.   Cardiovascular: Negative for chest pain, palpitations and leg swelling.  Gastrointestinal: Negative for nausea, vomiting, abdominal pain, diarrhea, constipation, blood in stool, abdominal distention and anal bleeding.  Genitourinary: Negative for dysuria, hematuria, flank pain and difficulty urinating.  Musculoskeletal: Negative for myalgias, back pain, joint swelling, arthralgias and gait problem.  Skin: Negative for color change, pallor, rash and wound.  Neurological: Negative for dizziness, tremors, weakness and light-headedness.  Hematological: Negative for adenopathy. Does not bruise/bleed easily.  Psychiatric/Behavioral: Negative for behavioral problems, confusion, disturbed wake/sleep cycle, dysphoric mood, decreased concentration and agitation.       Objective:   Physical Exam  Constitutional: He is oriented to person, place, and time. He appears well-developed and well-nourished. No distress.  HENT:  Head: Normocephalic and atraumatic.  Mouth/Throat: Oropharynx is clear and moist. No oropharyngeal exudate.    Eyes: Conjunctivae and EOM are normal. Pupils are equal, round, and reactive to light. No scleral icterus.  Neck: Normal range of motion. Neck supple. No JVD present.  Cardiovascular: Normal rate, regular rhythm and normal heart sounds.  Exam reveals no gallop and no friction rub.   No murmur heard. Pulmonary/Chest: Effort normal and breath sounds normal. No respiratory distress. He has no wheezes. He has no rales. He exhibits no tenderness.  Abdominal: He exhibits no distension and no mass. There is no tenderness. There is no rebound and no guarding.  Musculoskeletal: He exhibits no edema and no tenderness.  Lymphadenopathy:    He has no cervical adenopathy.  Neurological: He is alert and oriented to person, place, and time. He has normal reflexes. He exhibits normal muscle tone. Coordination normal.  Skin: Skin is warm and dry. He is not diaphoretic. No erythema. No pallor.  Psychiatric: He has a normal mood and affect. His behavior is normal. Judgment and thought content normal.          Assessment & Plan:  HIV INFECTION Near Perfect control. VL of 40 likely a blip  SMOKER Try Chantix

## 2011-11-14 NOTE — Patient Instructions (Signed)
Excellent job!  I have also sent rx for chantix starter pack to help you stop smoking  After you finish the "starter pack" proceed to the continuation pack

## 2011-11-14 NOTE — Assessment & Plan Note (Signed)
Try Chantix 

## 2011-11-14 NOTE — Assessment & Plan Note (Signed)
Near Perfect control. VL of 40 likely a blip

## 2011-11-20 ENCOUNTER — Encounter (HOSPITAL_COMMUNITY): Payer: Self-pay | Admitting: Physical Medicine and Rehabilitation

## 2011-11-20 ENCOUNTER — Emergency Department (HOSPITAL_COMMUNITY)
Admission: EM | Admit: 2011-11-20 | Discharge: 2011-11-20 | Disposition: A | Discharge status: Home / Self Care | Payer: Self-pay | Attending: Emergency Medicine | Admitting: Emergency Medicine

## 2011-11-20 ENCOUNTER — Emergency Department (HOSPITAL_COMMUNITY): Payer: Self-pay

## 2011-11-20 DIAGNOSIS — J189 Pneumonia, unspecified organism: Secondary | ICD-10-CM

## 2011-11-20 DIAGNOSIS — F17200 Nicotine dependence, unspecified, uncomplicated: Secondary | ICD-10-CM | POA: Insufficient documentation

## 2011-11-20 DIAGNOSIS — D72829 Elevated white blood cell count, unspecified: Secondary | ICD-10-CM

## 2011-11-20 DIAGNOSIS — R509 Fever, unspecified: Secondary | ICD-10-CM

## 2011-11-20 DIAGNOSIS — B20 Human immunodeficiency virus [HIV] disease: Secondary | ICD-10-CM | POA: Insufficient documentation

## 2011-11-20 HISTORY — DX: Human immunodeficiency virus (HIV) disease: B20

## 2011-11-20 HISTORY — DX: Asymptomatic human immunodeficiency virus (hiv) infection status: Z21

## 2011-11-20 LAB — BASIC METABOLIC PANEL
Chloride: 98 mEq/L (ref 96–112)
Creatinine, Ser: 0.77 mg/dL (ref 0.50–1.35)
GFR calc Af Amer: >90 mL/min (ref >90)

## 2011-11-20 LAB — CBC
MCV: 91.6 fL (ref 78.0–100.0)
Platelets: 247 10*3/uL (ref 150–400)
RDW: 12.8 % (ref 11.5–15.5)
WBC: 16.4 10*3/uL — ABNORMAL HIGH (ref 4.0–10.5)

## 2011-11-20 MED ORDER — AZITHROMYCIN 250 MG PO TABS
500.0000 mg | ORAL_TABLET | Freq: Once | ORAL | Status: AC
  Administered 2011-11-20: 500 mg via ORAL
  Filled 2011-11-20: qty 2

## 2011-11-20 MED ORDER — LEVOFLOXACIN 500 MG PO TABS: 500.0000 mg | ORAL_TABLET | Freq: Every day | ORAL | Status: AC

## 2011-11-20 MED ORDER — CEFTRIAXONE SODIUM 1 G IJ SOLR
1.0000 g | INTRAMUSCULAR | Status: DC
  Administered 2011-11-20: 1 g via INTRAVENOUS
  Filled 2011-11-20: qty 10

## 2011-11-20 MED ORDER — ALBUTEROL SULFATE HFA 108 (90 BASE) MCG/ACT IN AERS
2.0000 | INHALATION_SPRAY | RESPIRATORY_TRACT | Status: DC | PRN
  Administered 2011-11-20: 2 via RESPIRATORY_TRACT
  Filled 2011-11-20: qty 6.7

## 2011-11-20 NOTE — ED Notes (Signed)
Pt presents to department for evaluation of sinus congestion, dry cough and nausea. Ongoing x4 days. Pt states pain to chest with coughing. Denies pain at the time. Respirations unlabored. Pt is alert and oriented x4. No signs of distress noted.

## 2011-11-20 NOTE — Consult Note (Signed)
Requesting physician: Dr. Jerelyn Scott, EDP  Primary Care Physician: Acey Lav, MD  Reason for consultation: Cough, chest pain   History of Present Illness: 51 y/o man with h/o well controlled HIV (CD4 >1000), who last saw Dr. Daiva Eves on 11/14/11, presents to the hospital with about a 5 day history of cough, chest pain with coughing and sinus congestion. CXR in the ED is c/w PNA and we are asked to assess him for possible admission. He has not had temps at home but was found to have a temp of 100.5 in the ED.  Allergies:  No Known Allergies    Past Medical History  Diagnosis Date  . Legal circumstances 12/01/2008    felony murder conviction on his record  . HIV (human immunodeficiency virus infection)     No past surgical history on file.  Scheduled Meds:    . azithromycin  500 mg Oral Once  . cefTRIAXone (ROCEPHIN)  IV  1 g Intravenous Q24H   Continuous Infusions:  PRN Meds:.albuterol  Social History:  reports that he has been smoking Cigarettes.  He has been smoking about .5 packs per day. He does not have any smokeless tobacco history on file. He reports that he does not drink alcohol or use illicit drugs.  History reviewed. No pertinent family history.  Review of Systems:  Constitutional: Denies chills, diaphoresis, appetite change and fatigue.  HEENT: Denies photophobia, eye pain, redness, hearing loss, ear pain, rhinorrhea, sneezing, mouth sores, trouble swallowing, neck pain, neck stiffness and tinnitus.   Respiratory: Denies chest tightness,  and wheezing.   Cardiovascular: Denies chest pain, palpitations and leg swelling.  Gastrointestinal: Denies nausea, vomiting, abdominal pain, diarrhea, constipation, blood in stool and abdominal distention.  Genitourinary: Denies dysuria, urgency, frequency, hematuria, flank pain and difficulty urinating.  Musculoskeletal: Denies myalgias, back pain, joint swelling, arthralgias and gait problem.  Skin: Denies pallor,  rash and wound.  Neurological: Denies dizziness, seizures, syncope, weakness, light-headedness, numbness and headaches.  Hematological: Denies adenopathy. Easy bruising, personal or family bleeding history  Psychiatric/Behavioral: Denies suicidal ideation, mood changes, confusion, nervousness, sleep disturbance and agitation   Physical Exam: Blood pressure 109/74, pulse 93, temperature 100.7 F (38.2 C), temperature source Oral, resp. rate 20, SpO2 95.00%. Gen: AA Ox3 HEENt: [place]/AT/ PERRL/EOMI, moist mucous membranes. Neck: supple, no JVD, no LAD, no bruits, no goiter. CV: RRR, no M/R/G Lungs: CTA B Abd: S/NT/ND/+BS/ no masses or organomegaly noted. Ext: no C/C/E Neuro: intact and non-focal Skin: no rashes seen.  Labs on Admission:  Results for orders placed during the hospital encounter of 11/20/11 (from the past 48 hour(s))  CBC     Status: Abnormal   Collection Time   11/20/11  3:52 PM      Component Value Range Comment   WBC 16.4 (*) 4.0 - 10.5 K/uL    RBC 4.27  4.22 - 5.81 MIL/uL    Hemoglobin 14.0  13.0 - 17.0 g/dL    HCT 16.1  09.6 - 04.5 %    MCV 91.6  78.0 - 100.0 fL    MCH 32.8  26.0 - 34.0 pg    MCHC 35.8  30.0 - 36.0 g/dL    RDW 40.9  81.1 - 91.4 %    Platelets 247  150 - 400 K/uL   BASIC METABOLIC PANEL     Status: Abnormal   Collection Time   11/20/11  3:52 PM      Component Value Range Comment   Sodium 138  135 - 145 mEq/L    Potassium 4.1  3.5 - 5.1 mEq/L    Chloride 98  96 - 112 mEq/L    CO2 29  19 - 32 mEq/L    Glucose, Bld 120 (*) 70 - 99 mg/dL    BUN 7  6 - 23 mg/dL    Creatinine, Ser 1.61  0.50 - 1.35 mg/dL    Calcium 9.7  8.4 - 09.6 mg/dL    GFR calc non Af Amer >90  >90 mL/min    GFR calc Af Amer >90  >90 mL/min     Radiological Exams on Admission: Dg Chest 2 View  11/20/2011  *RADIOLOGY REPORT*  Clinical Data: Cough.  Congestion.  Upper respiratory infection.  CHEST - 2 VIEW  Comparison: None.  Findings: Abnormal confluent airspace opacity in  the right middle lobe is best visualized on the lateral projection.  Large lung volumes noted with flattening of hemidiaphragms suggesting air trapping.  Cardiac and mediastinal contours appear unremarkable.  IMPRESSION:  1.  Confluent but ill-defined density in the right middle lobe favoring pneumonia.  Given the patient's age, I recommend follow-up two-view chest radiography in 4 weeks time in order to ensure complete resolution and exclude underlying lung cancer. 2.  Large lung volumes favoring emphysema.   Original Report Authenticated By: Dellia Cloud, M.D.     Assessment/Plan Principal Problem:  *CAP (community acquired pneumonia) Active Problems:  HIV INFECTION  Leukocytosis  Fever   CAP -Fever/leukocytosis/cough plus an infiltrate on CXR. -He has an oxygen sat of 95% on RA. -Seems reliable and is willing to try a trial of PO antibiotics at home. -I think it is safe to discharge him with levaquin 500 mg PO daily for 7 days. -This has been discussed with EDP Dr. Karma Ganja.  HIV -Follows with Dr. Daiva Eves. -Well controlled with a recent CD4 count of >1000. -Continue HAART.  Disposition -Discharge home from ED with prescription for 7 days of Levaquin. -Have asked him to try and follow up with Dr. Daiva Eves in 2-3 weeks. -Has been advised to return to the ED if he develops high fever, or inability to tolerate PO antibiotic.   Time Spent on Consultation: 40 minutes  HERNANDEZ ACOSTA,ESTELA [facility]  [phone] 11/20/2011, 6:22 PM

## 2011-11-20 NOTE — ED Notes (Signed)
Admitting @ bedside

## 2011-11-20 NOTE — ED Notes (Signed)
MD at bedside. 

## 2011-11-20 NOTE — ED Provider Notes (Signed)
History  This chart was scribed for Ethelda Chick, MD by Ladona Ridgel Day. This patient was seen in room TR04C/TR04C and the patient's care was started at 1251.   CSN: [id number]  Arrival date & time 11/20/11  1251   None     Chief Complaint  Patient presents with  . Nasal Congestion  . URI  . Cough   The history is provided by the patient. No language interpreter was used.   Christopher Farrell is a 51 y.o. male who presents to the Emergency Department complaining of constant gradually worsening sinus congestion and dry cough for the past 4 days. He states some chest tightness from coughing and had 1 emesis episode last PM, 2 days ago and 3 days ago with some associated abdominal pain. He had fever of 100.5 at triage but did not check his temperature at home. He denies any urinary symptoms and is a smoker.  Past Medical History  Diagnosis Date  . Legal circumstances 12/01/2008    felony murder conviction on his record  . HIV (human immunodeficiency virus infection)     No past surgical history on file.  History reviewed. No pertinent family history.  History  Substance Use Topics  . Smoking status: Current Every Day Smoker -- 0.5 packs/day    Types: Cigarettes  . Smokeless tobacco: Not on file  . Alcohol Use: No      Review of Systems  Constitutional: Positive for fever. Negative for chills.  HENT: Negative for nosebleeds.   Respiratory: Positive for cough. Negative for shortness of breath.   Gastrointestinal: Positive for vomiting and abdominal pain. Negative for nausea.  Genitourinary: Negative for dysuria and difficulty urinating.  Musculoskeletal: Negative for back pain.  Neurological: Negative for weakness.  All other systems reviewed and are negative.    Allergies  Review of patient's allergies indicates no known allergies.  Home Medications   Current Outpatient Rx  Name Route Sig Dispense Refill  . EFAVIRENZ-EMTRICITAB-TENOFOVIR 600-200-300 MG PO TABS  Oral Take 1 tablet by mouth daily.    [name] LEVOFLOXACIN 500 MG PO TABS Oral Take 1 tablet (500 mg total) by mouth daily. 7 tablet 0    Triage Vitals: BP 104/67  Pulse 114  Temp 100.5 F (38.1 C) (Oral)  Resp 16  SpO2 93%  Physical Exam  Nursing note and vitals reviewed. Constitutional: He is oriented to person, place, and time. He appears well-developed and well-nourished. No distress.  HENT:  Head: Normocephalic and atraumatic.  Eyes: EOM are normal.  Neck: Neck supple. No tracheal deviation present.  Cardiovascular: Normal rate, regular rhythm and normal heart sounds.   Pulmonary/Chest: Effort normal. No respiratory distress. He has wheezes (bilateral faint expiratory wheezing). He has no rales.  Abdominal: Soft. He exhibits no distension. There is no tenderness. There is no rebound and no guarding.  Musculoskeletal: Normal range of motion.  Neurological: He is alert and oriented to person, place, and time.  Skin: Skin is warm and dry.  Psychiatric: He has a normal mood and affect. His behavior is normal.    ED Course  Procedures (including critical care time) DIAGNOSTIC STUDIES: Oxygen Saturation is 93% on room air, adequate by my interpretation.    COORDINATION OF CARE: At 250 PM Discussed treatment plan with patient which includes CXR, ibuprofen, and breathing treatment. Patient agrees.   At 550 PM Spoke with hospitalist who feels that patient may be well enough for outpatient care but will provide consultation here in ED to  further assess patients condition.   6:18 PM  [facility] has evaluated patient in the ED.  She recommends outpatient treatment- pt has received zithromax and rocephin in ED.  She recommends levaquin 500mg  po qD x 7 days, pt to f/u with Dr. Daiva Eves.    Labs Reviewed  CBC - Abnormal; Notable for the following:    WBC 16.4 (*)     All other components within normal limits  BASIC METABOLIC PANEL - Abnormal; Notable for the following:    Glucose,  Bld 120 (*)     All other components within normal limits  LAB REPORT - SCANNED   No results found.   1. Community acquired pneumonia   2. Fever   3. Human immunodeficiency virus (HIV) disease   4. Leukocytosis       MDM  Chart reviewed- he has had recent evaluation by ID for his HIV with low viral load and good CD4 counts.  Pt has been seen in consultation by hospitalist service- they advise outpatient management, started on antiboitocs in ED, given strict return precautions.  Pt is agreeable with the plan for discharge.     I personally performed the services described in this documentation, which was scribed in my presence. The recorded information has been reviewed and considered.          Ethelda Chick, MD 11/23/11 [phone]

## 2011-11-20 NOTE — ED Notes (Signed)
Pt to xray at this time.

## 2011-11-21 ENCOUNTER — Ambulatory Visit: Payer: Self-pay

## 2011-11-22 ENCOUNTER — Ambulatory Visit: Payer: Self-pay

## 2011-11-24 ENCOUNTER — Ambulatory Visit (INDEPENDENT_AMBULATORY_CARE_PROVIDER_SITE_OTHER): Payer: Self-pay | Admitting: Infectious Disease

## 2011-11-24 ENCOUNTER — Encounter: Payer: Self-pay | Admitting: Infectious Disease

## 2011-11-24 VITALS — BP 108/75 | HR 92 | Temp 98.1°F | Wt 125.0 lb

## 2011-11-24 DIAGNOSIS — B20 Human immunodeficiency virus [HIV] disease: Secondary | ICD-10-CM

## 2011-11-24 DIAGNOSIS — F17200 Nicotine dependence, unspecified, uncomplicated: Secondary | ICD-10-CM

## 2011-11-24 DIAGNOSIS — J189 Pneumonia, unspecified organism: Secondary | ICD-10-CM

## 2011-11-24 DIAGNOSIS — Z23 Encounter for immunization: Secondary | ICD-10-CM

## 2011-11-24 NOTE — Progress Notes (Signed)
  Subjective:    Patient ID: Christopher Farrell, male    DOB: [birth year], 51 y.o.   MRN: [id number]  HPI  Christopher Farrell is a 51 y.o. male who is doing superbly well on his antiviral regimen, atripla with nearly undetectable viral load,-40 and health cd4 count whom I saw on the 9th of this month. That visit patient was seen in emergent department cough and fever and found on chest x-ray to have a possible right middle lobe pneumonia. He was treated with azithromycin and ceftriaxone in house and given 7days of levofloxacin.he feels much improved and he is stop smoking. We gave him an influenza vaccination today and will have patient followup with [place] at the clinic visit but is ordered arranged.   Review of Systems  Constitutional: Negative for fever, chills, diaphoresis, activity change, appetite change, fatigue and unexpected weight change.  HENT: Negative for congestion, sore throat, rhinorrhea, sneezing, trouble swallowing and sinus pressure.   Eyes: Negative for photophobia and visual disturbance.  Respiratory: Positive for cough. Negative for chest tightness, shortness of breath, wheezing and stridor.   Cardiovascular: Negative for chest pain, palpitations and leg swelling.  Gastrointestinal: Negative for nausea, vomiting, abdominal pain, diarrhea, constipation, blood in stool, abdominal distention and anal bleeding.  Genitourinary: Negative for dysuria, hematuria, flank pain and difficulty urinating.  Musculoskeletal: Negative for myalgias, back pain, joint swelling, arthralgias and gait problem.  Skin: Negative for color change, pallor, rash and wound.  Neurological: Negative for dizziness, tremors, weakness and light-headedness.  Hematological: Negative for adenopathy. Does not bruise/bleed easily.  Psychiatric/Behavioral: Negative for behavioral problems, confusion, disturbed wake/sleep cycle, dysphoric mood, decreased concentration and agitation.       Objective:   Physical Exam    Constitutional: He is oriented to person, place, and time. He appears well-developed and well-nourished. No distress.  HENT:  Head: Normocephalic and atraumatic.  Mouth/Throat: Oropharynx is clear and moist. No oropharyngeal exudate.  Eyes: Conjunctivae normal and EOM are normal. Pupils are equal, round, and reactive to light. No scleral icterus.  Neck: Normal range of motion. Neck supple. No JVD present.  Cardiovascular: Normal rate, regular rhythm and normal heart sounds.  Exam reveals no gallop and no friction rub.   No murmur heard. Pulmonary/Chest: Effort normal and breath sounds normal. No respiratory distress. He has no wheezes. He has no rales. He exhibits no tenderness.  Abdominal: He exhibits no distension and no mass. There is no tenderness. There is no rebound and no guarding.  Musculoskeletal: He exhibits no edema and no tenderness.  Lymphadenopathy:    He has no cervical adenopathy.  Neurological: He is alert and oriented to person, place, and time. He has normal reflexes. He exhibits normal muscle tone. Coordination normal.  Skin: Skin is warm and dry. He is not diaphoretic. No erythema. No pallor.  Psychiatric: He has a normal mood and affect. His behavior is normal. Judgment and thought content normal.          Assessment & Plan:  CAP (community acquired pneumonia) Resolving. Radiology recommended followup chest x-ray we will do so.  HIV INFECTION Excellent control continue Atripla.  SMOKER hopefullyhe will continue to abstain from tobacco

## 2011-11-24 NOTE — Assessment & Plan Note (Signed)
Excellent control continue Atripla.

## 2011-11-24 NOTE — Assessment & Plan Note (Signed)
Resolving. Radiology recommended followup chest x-ray we will do so.

## 2011-11-24 NOTE — Assessment & Plan Note (Signed)
hopefullyhe will continue to abstain from tobacco

## 2011-11-25 DIAGNOSIS — Z23 Encounter for immunization: Secondary | ICD-10-CM

## 2011-12-23 ENCOUNTER — Encounter (HOSPITAL_COMMUNITY): Payer: Self-pay | Admitting: Unknown Physician Specialty

## 2011-12-23 ENCOUNTER — Inpatient Hospital Stay (HOSPITAL_COMMUNITY)
Admission: EM | Admit: 2011-12-23 | Discharge: 2011-12-26 | DRG: 602 | Disposition: A | Discharge status: Home / Self Care | Payer: MEDICAID | Attending: Infectious Diseases | Admitting: Infectious Diseases

## 2011-12-23 DIAGNOSIS — B353 Tinea pedis: Secondary | ICD-10-CM | POA: Diagnosis present

## 2011-12-23 DIAGNOSIS — L02619 Cutaneous abscess of unspecified foot: Principal | ICD-10-CM | POA: Diagnosis present

## 2011-12-23 DIAGNOSIS — Z8701 Personal history of pneumonia (recurrent): Secondary | ICD-10-CM

## 2011-12-23 DIAGNOSIS — Z87891 Personal history of nicotine dependence: Secondary | ICD-10-CM

## 2011-12-23 DIAGNOSIS — L03039 Cellulitis of unspecified toe: Principal | ICD-10-CM | POA: Diagnosis present

## 2011-12-23 DIAGNOSIS — L03115 Cellulitis of right lower limb: Secondary | ICD-10-CM

## 2011-12-23 DIAGNOSIS — Z791 Long term (current) use of non-steroidal anti-inflammatories (NSAID): Secondary | ICD-10-CM

## 2011-12-23 DIAGNOSIS — A4902 Methicillin resistant Staphylococcus aureus infection, unspecified site: Secondary | ICD-10-CM | POA: Diagnosis present

## 2011-12-23 DIAGNOSIS — B20 Human immunodeficiency virus [HIV] disease: Secondary | ICD-10-CM | POA: Diagnosis present

## 2011-12-23 LAB — CBC WITH DIFFERENTIAL/PLATELET
Basophils Absolute: 0 10*3/uL (ref 0.0–0.1)
HCT: 37 % — ABNORMAL LOW (ref 39.0–52.0)
Lymphocytes Relative: 24 % (ref 12–46)
Monocytes Absolute: 0.5 10*3/uL (ref 0.1–1.0)
Neutro Abs: 6.8 10*3/uL (ref 1.7–7.7)
Neutrophils Relative %: 68 % (ref 43–77)
Platelets: 208 10*3/uL (ref 150–400)
RDW: 13.2 % (ref 11.5–15.5)
WBC: 9.9 10*3/uL (ref 4.0–10.5)

## 2011-12-23 LAB — BASIC METABOLIC PANEL
CO2: 26 mEq/L (ref 19–32)
Chloride: 104 mEq/L (ref 96–112)
Potassium: 3.9 mEq/L (ref 3.5–5.1)
Sodium: 140 mEq/L (ref 135–145)

## 2011-12-23 MED ORDER — ACETAMINOPHEN 325 MG PO TABS: 650.0000 mg | ORAL_TABLET | Freq: Four times a day (QID) | ORAL | Status: DC | PRN

## 2011-12-23 MED ORDER — CLINDAMYCIN PHOSPHATE 600 MG/50ML IV SOLN
600.0000 mg | Freq: Three times a day (TID) | INTRAVENOUS | Status: DC
  Administered 2011-12-23 – 2011-12-24 (×3): 600 mg via INTRAVENOUS
  Filled 2011-12-23 (×5): qty 50

## 2011-12-23 MED ORDER — TRAMADOL HCL 50 MG PO TABS: 100.0000 mg | ORAL_TABLET | Freq: Four times a day (QID) | ORAL | Status: DC | PRN

## 2011-12-23 MED ORDER — TOLNAFTATE 1 % EX POWD
Freq: Two times a day (BID) | CUTANEOUS | Status: DC
  Filled 2011-12-23 (×2): qty 45

## 2011-12-23 MED ORDER — SODIUM CHLORIDE 0.9 % IV SOLN
INTRAVENOUS | Status: DC
  Administered 2011-12-23 – 2011-12-25 (×4): via INTRAVENOUS

## 2011-12-23 MED ORDER — ALUM SULFATE-CA ACETATE EX PACK
1.0000 | PACK | Freq: Three times a day (TID) | CUTANEOUS | Status: DC
  Administered 2011-12-23: 1 via TOPICAL
  Filled 2011-12-23 (×7): qty 1

## 2011-12-23 NOTE — ED Notes (Signed)
Rt. Foot is red and swollen on the top of the foot. Area is marked  Yellowish drainage noted between his toes.

## 2011-12-23 NOTE — ED Provider Notes (Signed)
Cellulitis protocol, on clinda.  Give 2 dose and recheck.  Go home on same. Hx HIV, well controlled.  May need case management to help with medication cost prior to discharge.    5:45 PM Pt currently resting comfortably, on exam pt has cellulitic skin changes to dorsum of R feet extending up toward mid lower leg anteriorly.  Pustular exudates noted between 4th-5th toes, and evidence of skin maceration noted between webspace of toes.  Pedal pulse 2+.  R ankle with normal ROM.    Since pt has hx of HIV, will ensure pt has adequate IV abx here prior to d/c.  Pt sts he will be able to afford clindamycin abx at discharge.  Currently no significant worsening of erythema from drawn margin.  Pt is afebrile.    8:39 PM Pt resting comfortably.  No significant skin changes.  VSS, afebrile.  Will continue protocol.   12:12 AM Pt report given to Dr. Patria Mane, who will continue care over night and dispo tomorrow.  BP 125/74  Pulse 70  Temp 98.9 F (37.2 C) (Oral)  Resp 18  SpO2 98%  Nursing notes reviewed and considered in documentation  Previous records reviewed and considered  All labs/vitals reviewed and considered  xrays reviewed and considered  Results for orders placed during the hospital encounter of 12/23/11  CBC WITH DIFFERENTIAL      Component Value Range   WBC 9.9  4.0 - 10.5 K/uL   RBC 3.98 (*) 4.22 - 5.81 MIL/uL   Hemoglobin 12.8 (*) 13.0 - 17.0 g/dL   HCT 40.9 (*) 81.1 - 91.4 %   MCV 93.0  78.0 - 100.0 fL   MCH 32.2  26.0 - 34.0 pg   MCHC 34.6  30.0 - 36.0 g/dL   RDW 78.2  95.6 - 21.3 %   Platelets 208  150 - 400 K/uL   Neutrophils Relative 68  43 - 77 %   Neutro Abs 6.8  1.7 - 7.7 K/uL   Lymphocytes Relative 24  12 - 46 %   Lymphs Abs 2.4  0.7 - 4.0 K/uL   Monocytes Relative 5  3 - 12 %   Monocytes Absolute 0.5  0.1 - 1.0 K/uL   Eosinophils Relative 2  0 - 5 %   Eosinophils Absolute 0.2  0.0 - 0.7 K/uL   Basophils Relative 0  0 - 1 %   Basophils Absolute 0.0  0.0 - 0.1  K/uL  BASIC METABOLIC PANEL      Component Value Range   Sodium 140  135 - 145 mEq/L   Potassium 3.9  3.5 - 5.1 mEq/L   Chloride 104  96 - 112 mEq/L   CO2 26  19 - 32 mEq/L   Glucose, Bld 102 (*) 70 - 99 mg/dL   BUN 9  6 - 23 mg/dL   Creatinine, Ser 0.86  0.50 - 1.35 mg/dL   Calcium 8.8  8.4 - 57.8 mg/dL   GFR calc non Af Amer >90  >90 mL/min   GFR calc Af Amer >90  >90 mL/min   No results found.    Fayrene Helper, PA-C 12/24/11 0013

## 2011-12-23 NOTE — ED Provider Notes (Signed)
Patient seen by me and followed by PA in CDU, see CDU notes  Devoria Albe, MD, Franz Dell, MD 12/23/11 [phone]

## 2011-12-23 NOTE — ED Notes (Signed)
Patient states he has had swelling from his right foot since Sunday. Last night his right foot from between his forth and fifth digit busted and started draining.

## 2011-12-23 NOTE — ED Notes (Addendum)
Rt. Lower leg and foot swelling. Took ibuprofen. Did not fall. Believes it is r/t boots. Now, puss coming from the middle and 4th digit toe.

## 2011-12-23 NOTE — ED Provider Notes (Signed)
Patient to move to CDU under observation, cellulitis protocol.  This is a shared visit with Dr Charline Bills. Lynelle Doctor.   Pt with hx HIV, tinea pedis, now with cellulitis.    12:46 PM Patient reports pain is currently under control, no needs at this time.  Right foot is erythematous, edematous, warm over dorsal aspect, drainage from between all toes, there is an erythematous streak traveling up patient's right shin.  Patient currently wearing jeans, I have asked him to remove them and will continue exam and mark the erythematous area.  Per recent note from Dr Synthia Innocent (11/24/11), patient with excellent control of his HIV at this time.    2:11 PM Cellulitis is marked.  The obvious erythema is marked with a solid line while the very light suggestion of erythema is marked with dotted lines.    3:12 PM Discussed patient with Fayrene Helper, PA-C, who assumes care of patient at change of shift.    Results for orders placed during the hospital encounter of 12/23/11  CBC WITH DIFFERENTIAL      Component Value Range   WBC 9.9  4.0 - 10.5 K/uL   RBC 3.98 (*) 4.22 - 5.81 MIL/uL   Hemoglobin 12.8 (*) 13.0 - 17.0 g/dL   HCT 04.5 (*) 40.9 - 81.1 %   MCV 93.0  78.0 - 100.0 fL   MCH 32.2  26.0 - 34.0 pg   MCHC 34.6  30.0 - 36.0 g/dL   RDW 91.4  78.2 - 95.6 %   Platelets 208  150 - 400 K/uL   Neutrophils Relative 68  43 - 77 %   Neutro Abs 6.8  1.7 - 7.7 K/uL   Lymphocytes Relative 24  12 - 46 %   Lymphs Abs 2.4  0.7 - 4.0 K/uL   Monocytes Relative 5  3 - 12 %   Monocytes Absolute 0.5  0.1 - 1.0 K/uL   Eosinophils Relative 2  0 - 5 %   Eosinophils Absolute 0.2  0.0 - 0.7 K/uL   Basophils Relative 0  0 - 1 %   Basophils Absolute 0.0  0.0 - 0.1 K/uL  BASIC METABOLIC PANEL      Component Value Range   Sodium 140  135 - 145 mEq/L   Potassium 3.9  3.5 - 5.1 mEq/L   Chloride 104  96 - 112 mEq/L   CO2 26  19 - 32 mEq/L   Glucose, Bld 102 (*) 70 - 99 mg/dL   BUN 9  6 - 23 mg/dL   Creatinine, Ser 2.13  0.50 - 1.35 mg/dL   Calcium 8.8  8.4 - 08.6 mg/dL   GFR calc non Af Amer >90  >90 mL/min   GFR calc Af Amer >90  >90 mL/min   No results found.    [place], [place] 12/23/11 1514

## 2011-12-23 NOTE — ED Provider Notes (Cosign Needed)
History     CSN: [id number]  Arrival date & time 12/23/11  4098   First MD Initiated Contact with Patient 12/23/11 1059      Chief Complaint  Patient presents with  . Leg Swelling    (Consider location/radiation/quality/duration/timing/severity/associated sxs/prior treatment) The history is provided by the spouse.    5 days ago he walked to work from his house which took 2 hours. Patient states he has been wearing the same boots for several months. He wears white cotton socks. States that night he started having swelling in his right foot and he's been putting Neosporin on his foot. He states last night it started draining and his foot started getting more swollen and painful. He denies any fever or chills. He states he has had athlete's foot once before but that was 20 years ago and not as bad as today.  PCP Dr. Zenaida Niece dam  Past Medical History  Diagnosis Date  . Legal circumstances 12/01/2008    felony murder conviction on his record  . HIV (human immunodeficiency virus infection)     History reviewed. No pertinent past surgical history.  History reviewed. No pertinent family history.  History  Substance Use Topics  . Smoking status: Current Every Day Smoker -- 0.5 packs/day    Types: Cigarettes  . Smokeless tobacco: Not on file  . Alcohol Use: 3.6 oz/week    6 Cans of beer per day     Daily   Employed at [employer]   Review of Systems  All other systems reviewed and are negative.    Allergies  Review of patient's allergies indicates no known allergies.  Home Medications   Current Outpatient Rx  Name Route Sig Dispense Refill  . EFAVIRENZ-EMTRICITAB-TENOFOVIR 600-200-300 MG PO TABS Oral Take 1 tablet by mouth daily.    . IBUPROFEN 200 MG PO TABS Oral Take 400 mg by mouth every 8 (eight) hours as needed. For pain      BP 101/66  Temp 98.6 F (37 C) (Oral)  Resp 12  SpO2 99%  Vital signs normal      Physical Exam  Nursing note and vitals  reviewed. Constitutional: He is oriented to person, place, and time. He appears well-developed and well-nourished.  Non-toxic appearance. He does not appear ill. No distress.  HENT:  Head: Normocephalic and atraumatic.  Right Ear: External ear normal.  Left Ear: External ear normal.  Nose: Nose normal. No mucosal edema or rhinorrhea.  Mouth/Throat: Oropharynx is clear and moist and mucous membranes are normal. No dental abscesses or uvula swelling.  Eyes: Conjunctivae normal and EOM are normal. Pupils are equal, round, and reactive to light.  Neck: Normal range of motion and full passive range of motion without pain. Neck supple.  Pulmonary/Chest: Effort normal. No respiratory distress. He has no rhonchi. He exhibits no crepitus.  Abdominal: Normal appearance.  Musculoskeletal: Normal range of motion. He exhibits no edema and no tenderness.       Moves all extremities well.   On exam of patient's left foot he is noted to have white fluffy material in his web spaces between his toes. There is no swelling, cracked skin, redness in his left foot consistent with tenia pedis.  On exam of patient's right foot is noted to have diffuse swelling and redness of the dorsum of his foot that goes up to the malleoli. There is a red streak that goes up the anterior portion of the lower leg about three fourths of the way  up the leg. He's noted to have diffuse swelling of all of his toes with an area where the skin is cracked on the fourth toe laterally. Is also noted to have white material in his web spaces of these toes consistent with tinea pedis  Neurological: He is alert and oriented to person, place, and time. He has normal strength. No cranial nerve deficit.  Skin: Skin is warm, dry and intact. No rash noted. No erythema. No pallor.  Psychiatric: He has a normal mood and affect. His speech is normal and behavior is normal. His mood appears not anxious.    ED Course  Procedures (including critical care  time)  Medications  0.9 %  sodium chloride infusion (  Intravenous New Bag/Given 12/23/11 1215)  clindamycin (CLEOCIN) IVPB 600 mg (600 mg Intravenous New Bag/Given 12/23/11 1408)  acetaminophen (TYLENOL) tablet 650 mg (not administered)  traMADol (ULTRAM) tablet 100 mg (not administered)  aluminum sulfate-calcium acetate (DOMEBORO) packet 1 packet (not administered)  tolnaftate (TINACTIN) 1 % powder (not administered)    11:22 Trixie Dredge PA accepts to CDU   Results for orders placed during the hospital encounter of 12/23/11  CBC WITH DIFFERENTIAL      Component Value Range   WBC 9.9  4.0 - 10.5 K/uL   RBC 3.98 (*) 4.22 - 5.81 MIL/uL   Hemoglobin 12.8 (*) 13.0 - 17.0 g/dL   HCT 16.1 (*) 09.6 - 04.5 %   MCV 93.0  78.0 - 100.0 fL   MCH 32.2  26.0 - 34.0 pg   MCHC 34.6  30.0 - 36.0 g/dL   RDW 40.9  81.1 - 91.4 %   Platelets 208  150 - 400 K/uL   Neutrophils Relative 68  43 - 77 %   Neutro Abs 6.8  1.7 - 7.7 K/uL   Lymphocytes Relative 24  12 - 46 %   Lymphs Abs 2.4  0.7 - 4.0 K/uL   Monocytes Relative 5  3 - 12 %   Monocytes Absolute 0.5  0.1 - 1.0 K/uL   Eosinophils Relative 2  0 - 5 %   Eosinophils Absolute 0.2  0.0 - 0.7 K/uL   Basophils Relative 0  0 - 1 %   Basophils Absolute 0.0  0.0 - 0.1 K/uL  BASIC METABOLIC PANEL      Component Value Range   Sodium 140  135 - 145 mEq/L   Potassium 3.9  3.5 - 5.1 mEq/L   Chloride 104  96 - 112 mEq/L   CO2 26  19 - 32 mEq/L   Glucose, Bld 102 (*) 70 - 99 mg/dL   BUN 9  6 - 23 mg/dL   Creatinine, Ser 7.82  0.50 - 1.35 mg/dL   Calcium 8.8  8.4 - 95.6 mg/dL   GFR calc non Af Amer >90  >90 mL/min   GFR calc Af Amer >90  >90 mL/min   Laboratory interpretation all normal except mild anemia      1. Tinea pedis of left foot   2. Tinea pedis, right   3. Cellulitis of foot, right     Plan Cellulitis protocol and hopefully discharge  Devoria Albe, MD, FACEP   MDM          Ward Givens, MD 12/23/11 2130  Ward Givens,  MD 12/28/11 1501

## 2011-12-23 NOTE — ED Notes (Signed)
Family at bedside. 

## 2011-12-24 ENCOUNTER — Inpatient Hospital Stay (HOSPITAL_COMMUNITY): Payer: Self-pay

## 2011-12-24 ENCOUNTER — Encounter (HOSPITAL_COMMUNITY): Payer: Self-pay | Admitting: *Deleted

## 2011-12-24 DIAGNOSIS — L02619 Cutaneous abscess of unspecified foot: Secondary | ICD-10-CM

## 2011-12-24 DIAGNOSIS — L03039 Cellulitis of unspecified toe: Secondary | ICD-10-CM

## 2011-12-24 DIAGNOSIS — B353 Tinea pedis: Secondary | ICD-10-CM

## 2011-12-24 HISTORY — DX: Cutaneous abscess of unspecified foot: L02.619

## 2011-12-24 HISTORY — DX: Cutaneous abscess of unspecified foot: L03.039

## 2011-12-24 MED ORDER — EFAVIRENZ-EMTRICITAB-TENOFOVIR 600-200-300 MG PO TABS
1.0000 | ORAL_TABLET | Freq: Every day | ORAL | Status: DC
  Administered 2011-12-26 (×2): 1 via ORAL
  Filled 2011-12-24 (×4): qty 1

## 2011-12-24 MED ORDER — VANCOMYCIN HCL 1000 MG IV SOLR
750.0000 mg | Freq: Two times a day (BID) | INTRAVENOUS | Status: DC
  Administered 2011-12-24 – 2011-12-26 (×4): 750 mg via INTRAVENOUS
  Filled 2011-12-24 (×6): qty 750

## 2011-12-24 MED ORDER — EFAVIRENZ-EMTRICITAB-TENOFOVIR 600-200-300 MG PO TABS: 1.0000 | ORAL_TABLET | Freq: Every day | ORAL | Status: DC

## 2011-12-24 MED ORDER — ENOXAPARIN SODIUM 40 MG/0.4ML ~~LOC~~ SOLN
40.0000 mg | Freq: Every day | SUBCUTANEOUS | Status: DC
  Administered 2011-12-24 – 2011-12-25 (×2): 40 mg via SUBCUTANEOUS
  Filled 2011-12-24 (×3): qty 0.4

## 2011-12-24 NOTE — ED Provider Notes (Signed)
Pt with cellulitis of foot, extending to lower leg. Hiv. Iv abx. Unassigned med service called to see/admit.   Suzi Roots, MD 12/24/11 [phone]

## 2011-12-24 NOTE — H&P (Signed)
Hospital Admission Note Date: 12/24/2011  Patient name: Christopher Farrell Medical record number: [id number] Date of birth: [birth year] Age: 51 y.o. Gender: male PCP: Acey Lav, MD  Medical Service: Internal Medicine Teaching Service  Attending physician:  Dr. Ninetta Lights    1st Contact: Dr. Sherrine Maples   Pager: [phone] 2nd Contact: Dr. Anselm Jungling    Pager: [phone] After 5 pm or weekends: 1st Contact:      Pager: [phone] 2nd Contact:      Pager: [phone]  Chief Complaint: Right foot pain  History of Present Illness: 51yo M w/ h/p HIV, on Atripla with last CD4 count of 1070 and viral load 41, treated for CAP in September by Dr. Daiva Eves, who presents with cellulitis in his right foot x 1 week and recent abscess formation with rupture encompassing his 4-5th right toes since yesterday. He states that he was having some redness on the dorsum of his foot with some tenderness. He denies any pain in his toes until Friday, when he noticed blood and pus oozing from his right 4th and 5th toes. He states that the redness was beginning travel up his leg, so he decided to come to the ED to have his foot evaluated.  He denies any trauma to the foot, but endorses wearing steeled toed boots for work, which he walks 2 miles to and from. He does always wear socks. He denies any fevers, chills, other areas of erythema, joint pain, headaches, chest pain, difficulty breathing, SOB, N/V, diarrhea, or trouble urinating.  Meds: Current Outpatient Rx  Name Route Sig Dispense Refill  . EFAVIRENZ-EMTRICITAB-TENOFOVIR 600-200-300 MG PO TABS Oral Take 1 tablet by mouth daily.    . IBUPROFEN 200 MG PO TABS Oral Take 400 mg by mouth every 8 (eight) hours as needed. For pain      Allergies: Allergies as of 12/23/2011  . (No Known Allergies)   Past Medical History  Diagnosis Date  . Legal circumstances 12/01/2008    felony murder conviction on his record  . HIV (human immunodeficiency virus infection)    History reviewed.  No pertinent past surgical history. History reviewed. No pertinent family history. History   Social History  . Marital Status: Single    Spouse Name: N/A    Number of Children: N/A  . Years of Education: N/A   Occupational History  . Not on file.   Social History Main Topics  . Smoking status: Current Every Day Smoker -- 0.5 packs/day    Types: Cigarettes  . Smokeless tobacco: Not on file  . Alcohol Use: 3.6 oz/week    6 Cans of beer per week     Daily  . Drug Use: No  . Sexually Active: Not on file   Other Topics Concern  . Not on file   Social History Narrative  . No narrative on file    Review of Systems: Review of Systems  Constitutional: Denies fever, chills, diaphoresis, appetite change or fatigue.  HEENT: Denies photophobia, hearing loss, ear pain, congestion, sore throat, rhinorrhea, sneezing, mouth sores, trouble swallowing, or neck pain. Respiratory: Denies SOB, DOE, cough, chest tightness, or wheezing.  Cardiovascular: Denies chest pain, palpitations or leg swelling.  Gastrointestinal: Denies nausea, vomiting, abdominal pain, diarrhea, constipation, blood in stool and abdominal distention.  Genitourinary: Denies dysuria, urgency, frequency, hematuria, flank pain or difficulty urinating.  Musculoskeletal: Pain in right foot with ambulation. Denies myalgias, back pain, joint swelling, or arthralgias.  Skin: Erythema and pain on dorsum of right foot.  Right 4-5th open wound with purulent, sanguinous drainage.  Neurological: Denies dizziness, seizures, syncope, weakness, light-headedness, numbness or headaches.  Psychiatric/Behavioral: Denies suicidal ideation, mood changes, or agitation.  Physical Exam: Blood pressure 115/65, pulse 72, temperature 98.8 F (37.1 C), temperature source Oral, resp. rate 18, SpO2 99.00%. General: Well-developed, and cooperative to examination.  Head: Normocephalic and atraumatic.  Eyes: Vision grossly intact, PERRL, EOMI, and  anicteric.  Mouth: Pharynx pink and moist, no erythema, and no exudates.  Neck: Supple, no thyromegaly, no JVD, and no carotid bruits.  Lungs: CTAB, normal respiratory effort. Heart: RRR, no murmur, no gallop, and no rub.  Abdomen: Soft, non-tender, normal bowel sounds, no distention, no guarding, no rebound tenderness, no hepatosplenomegaly.  Msk: No joint swelling, edema, erythema, or pain.  Pulses: 2+ DP/PT pulses bilaterally Extremities: No cyanosis, clubbing, edema Neurologic: Alert & oriented X3, cranial nerves II-XII intact, strength normal in all extremities, sensation intact to light touch, and gait normal.  Skin: Erythema and warmth to dorsum of right foot, esp at the base of the 3-5th toes, extending superiorly just proximal to the ankle. Right 4th toe with open wound on the lateral portion with purulent, sanguious drainage. Right 5th toe also with purulent sanginous drainage, appears to have open area on dorsum of the toe.   Psych: Alert and oriented X3, memory intact for recent and remote, normally interactive, good eye contact, not anxious or depressed appearing.   Lab results: Basic Metabolic Panel:  Basename 12/23/11 1129  NA 140  K 3.9  CL 104  CO2 26  GLUCOSE 102*  BUN 9  CREATININE 0.73  CALCIUM 8.8  MG --  PHOS --   CBC:  Basename 12/23/11 1129  WBC 9.9  NEUTROABS 6.8  HGB 12.8*  HCT 37.0*  MCV 93.0  PLT 208   Imaging results:  Right foot X-ray pending  Other results: CXR pending  Assessment & Plan by Problem: 51yo M with h/o HIV who presents with right foot cellulitis x 1 week  and 4-5th toe abscesses noticed last night.  1. Cellulitis and abscess of toe: Cellulitis x 1 week with subsequent abscess development. Possible from trauma due to walking distances in steel toes boots, denies any other trauma. Abscesses possible from the 4th and 5th toes rubbing together. Pt started on Clindamycin in the ED, which did seem to improve the cellulitis.  Cultures of the wound were sent. Checking blood cultures to rule out dissemination. Starting Vancomycin, and will adjust abx once cultures return.  - IV Vancomycin per Pharmacy - F/u wound and blood cx - Wound care consult - Toradol for pain control  2. HIV: On Atripla, and endorses compliance with the medication. Last CD4 count 1070 and viral load 41.  - Continue Atripla daily  3. Previous CAP: Treated with Levaquin x 7 days, starting 11/20/11. Pt is a smoker, and was smoking 10 cigs.day, but states that he has not smoked in 2 weeks. He denies any cough, fever, or respiratory sx. Will obtain CXR to check for resolution of CAP. - CXR  4. F/E/N: - NS @ 75cc/hr - Regular diet  5. DVT PPx: [place] Lovenox    Signed: Genelle Gather 12/24/2011, 12:10 PM

## 2011-12-24 NOTE — ED Provider Notes (Signed)
Pt seen by me, followed by PA in CDU, see CDU notes  Ward Givens, MD 12/24/11 1503

## 2011-12-24 NOTE — Progress Notes (Signed)
ANTIBIOTIC CONSULT NOTE - INITIAL  Pharmacy Consult for vancomycin Indication: cellulits/abscess  No Known Allergies  Vital Signs: Temp: 98.8 F (37.1 C) (10/19 1127) Temp src: Oral (10/19 1127) BP: 115/65 mmHg (10/19 1127) Pulse Rate: 72  (10/19 1127) Intake/Output from previous day:   Intake/Output from this shift: Total I/O In: 1000 [I.V.:1000] Out: -   Labs:  Basename 12/23/11 1129  WBC 9.9  HGB 12.8*  PLT 208  LABCREA --  CREATININE 0.73   The CrCl is unknown because both a height and weight (above a minimum accepted value) are required for this calculation. No results found for this basename: VANCOTROUGH:2,VANCOPEAK:2,VANCORANDOM:2,GENTTROUGH:2,GENTPEAK:2,GENTRANDOM:2,TOBRATROUGH:2,TOBRAPEAK:2,TOBRARND:2,AMIKACINPEAK:2,AMIKACINTROU:2,AMIKACIN:2, in the last 72 hours   Microbiology: Recent Results (from the past 720 hour(s))  WOUND CULTURE     Status: Normal (Preliminary result)   Collection Time   12/23/11 12:04 PM      Component Value Range Status Comment   Specimen Description WOUND RIGHT FOOT   Final    Special Requests NONE   Final    Gram Stain     Final    Value: RARE WBC PRESENT, PREDOMINANTLY PMN     RARE SQUAMOUS EPITHELIAL CELLS PRESENT     MODERATE GRAM POSITIVE COCCI IN CLUSTERS     IN PAIRS   Culture Culture reincubated for better growth   Final    Report Status PENDING   Incomplete     Medical History: Past Medical History  Diagnosis Date  . Legal circumstances 12/01/2008    felony murder conviction on his record  . HIV (human immunodeficiency virus infection)     Medications:  Prescriptions prior to admission  Medication Sig Dispense Refill  . efavirenz-emtricitabine-tenofovir (ATRIPLA) 600-200-300 MG per tablet Take 1 tablet by mouth daily.      [name] ibuprofen (ADVIL,MOTRIN) 200 MG tablet Take 400 mg by mouth every 8 (eight) hours as needed. For pain       Assessment: 78 yom with history of HIV presented to the ED with right foot  pain. Pt is afebrile and WBC is WNL. He received 1x dose of clindamycin in the ED.   Vanc 10/19>>  10/18 Wound - pending  Goal of Therapy:  Vancomycin trough level 10-15 mcg/ml  Plan:  1. Vancomycin 750mg  IV Q12H 2. F/u renal fxn, C&S, clinical status and trough at [facility]  Carinne Brandenburger, Drake Leach 12/24/2011,1:36 PM

## 2011-12-25 LAB — CBC
HCT: 36.3 % — ABNORMAL LOW (ref 39.0–52.0)
MCHC: 34.2 g/dL (ref 30.0–36.0)
Platelets: 215 10*3/uL (ref 150–400)
RDW: 12.8 % (ref 11.5–15.5)
WBC: 8.2 10*3/uL (ref 4.0–10.5)

## 2011-12-25 MED ORDER — KETOROLAC TROMETHAMINE 10 MG PO TABS
10.0000 mg | ORAL_TABLET | Freq: Four times a day (QID) | ORAL | Status: DC
  Administered 2011-12-25 – 2011-12-26 (×5): 10 mg via ORAL
  Filled 2011-12-25 (×11): qty 1

## 2011-12-25 MED ORDER — KETOROLAC TROMETHAMINE 15 MG/ML IJ SOLN: 15.0000 mg | Freq: Four times a day (QID) | INTRAMUSCULAR | Status: DC

## 2011-12-25 NOTE — H&P (Signed)
Internal Medicine Attending Admission Note Date: 12/25/2011  Patient name: [name] Medical record number: [id number] Date of birth: 09/04/1960 Age: 51 y.o. Gender: male  I saw and evaluated the patient. I reviewed the resident's note and I agree with the resident's findings and plan as documented in the resident's note.  Chief Complaint(s): Right foot pain and redness  History - key components related to admission: Patient is a 51 year old man with past medical history most significant for well-controlled HIV who presented with right foot pain. Patient started having pain and redness about one week ago. The pain and redness progressively got worse over the week. One day prior to admission, he noticed discharge(pus and blood) oozing out from the right fourth and fifth toes and decided to come to ER. Currently he has pain only when he gets up on his feet.  Patient denies any fever, chills, similar infections in the past, joint pain, headaches, shortness of breath, nausea vomiting, diarrhea, difficulty urination.  12 point review of systems was done. Review of systems as per history of present illness.  Physical Exam - key components related to admission:  Filed Vitals:   12/24/11 1500 12/24/11 1819 12/24/11 2201 12/25/11 0622  BP:  109/70 116/67 100/55  Pulse:  71 68 69  Temp:  98.7 F (37.1 C) 98.9 F (37.2 C) 98.1 F (36.7 C)  TempSrc:  Oral Oral   Resp:  18 18 18   Height: 5\' 6"  (1.676 m)     Weight: 130 lb (58.968 kg)     SpO2:  99% 100% 100%   patient is oriented to time place and person and lying in bed comfortably. Cardiovascular exam: S1-S2 normal, no murmurs rubs or gallops Respiratory: Clear to auscultation bilaterally without any wheezing rhonchi or rales Abdomen: Soft nontender normal bowel sounds in all 4 quadrants Local exam: Patient's foot was covered with bandage. The cellulitis/redness seems much shrunken as compared to the outline drawn around the  cellulitis.   Lab results:   Basic Metabolic Panel:  [facility] 12/23/11 1129  NA 140  K 3.9  CL 104  CO2 26  GLUCOSE 102*  BUN 9  CREATININE 0.73  CALCIUM 8.8  MG --  PHOS --   CBC:  Basename 12/25/11 0720 12/23/11 1129  WBC 8.2 9.9  NEUTROABS -- 6.8  HGB 12.4* 12.8*  HCT 36.3* 37.0*  MCV 91.4 93.0  PLT 215 208     Imaging results:  X-ray Chest Pa And Lateral   12/24/2011  IMPRESSION: Pulmonary hyperinflation.  Complete clearing of right lobe middle lobe pneumonia since the prior study.   Original Report Authenticated By: Camelia Phenes, M.D.    Dg Foot Complete Right  12/24/2011  IMPRESSION: Negative for fracture or osteomyelitis.   Original Report Authenticated By: Camelia Phenes, M.D.       Assessment & Plan by Problem:  Principal Problem:  *Cellulitis and abscess of toe  HIV  Patient is a 51 year old man with past medical history most significant for well-controlled HIV who comes in with right foot cellulitis. Patient was given clindamycin in the ER which we have switched to vancomycin. There are no exacerbating/contributing factors to his infection at this time other than the mechanical injury by his steel toed boots. Patient will need to be on antibiotics for a total of 7-10 days along with frequent dressing changes. Patient's foot is already beginning to improve and I foresee patient to go home by tomorrow on oral antibiotics for MRSA coverage(  Bactrim or doxycycline). Patient does not want to go home today and requested to stay for one more day for pain control.  Rest of the medical management as per resident's note.  Lars Mage MD Pager [phone]

## 2011-12-25 NOTE — Progress Notes (Signed)
Subjective: Pt doing well today. Some pain with ambulation.   Objective: Vital signs in last 24 hours: Filed Vitals:   12/24/11 1500 12/24/11 1819 12/24/11 2201 12/25/11 0622  BP:  109/70 116/67 100/55  Pulse:  71 68 69  Temp:  98.7 F (37.1 C) 98.9 F (37.2 C) 98.1 F (36.7 C)  TempSrc:  Oral Oral   Resp:  18 18 18   Height: 5\' 6"  (1.676 m)     Weight: 130 lb (58.968 kg)     SpO2:  99% 100% 100%   Weight change:   Intake/Output Summary (Last 24 hours) at 12/25/11 1054 Last data filed at 12/25/11 0802  Gross per 24 hour  Intake    360 ml  Output    700 ml  Net   -340 ml   Vitals reviewed. General: Resting comfortably bed, NAD HEENT: PERRL, EOMI, no scleral icterus Cardiac: RRR, no rubs, murmurs or gallops Pulm: Clear to auscultation bilaterally, no wheezes, rales, or rhonchi Abd: Soft, nontender, nondistended Ext: Warm and well perfused, no pedal edema. Right foot bandaged, c/d/i Neuro: Alert and oriented X3, cranial nerves II-XII grossly intact, strength and sensation to light touch equal in bilateral upper and lower extremities. Lab Results: Basic Metabolic Panel:  Lab 12/23/11 1610  NA 140  K 3.9  CL 104  CO2 26  GLUCOSE 102*  BUN 9  CREATININE 0.73  CALCIUM 8.8  MG --  PHOS --   CBC:  Lab 12/25/11 0720 12/23/11 1129  WBC 8.2 9.9  NEUTROABS -- 6.8  HGB 12.4* 12.8*  HCT 36.3* 37.0*  MCV 91.4 93.0  PLT 215 208    Micro Results: Recent Results (from the past 240 hour(s))  WOUND CULTURE     Status: Normal (Preliminary result)   Collection Time   12/23/11 12:04 PM      Component Value Range Status Comment   Specimen Description WOUND RIGHT FOOT   Final    Special Requests NONE   Final    Gram Stain     Final    Value: RARE WBC PRESENT, PREDOMINANTLY PMN     RARE SQUAMOUS EPITHELIAL CELLS PRESENT     MODERATE GRAM POSITIVE COCCI IN CLUSTERS     IN PAIRS   Culture     Final    Value: ABUNDANT STAPHYLOCOCCUS AUREUS     Note: RIFAMPIN AND  GENTAMICIN SHOULD NOT BE USED AS SINGLE DRUGS FOR TREATMENT OF STAPH INFECTIONS.   Report Status PENDING   Incomplete    Studies/Results: X-ray Chest Pa And Lateral   12/24/2011  *RADIOLOGY REPORT*  Clinical Data: Follow up pneumonia  CHEST - 2 VIEW  Comparison: 11/20/2011  Findings: Right middle lobe pneumonia has completely cleared.  No residual infiltrate or effusion.  Hyperinflation of the lungs suggesting asthma/COPD.  Negative for mass lesion.  IMPRESSION: Pulmonary hyperinflation.  Complete clearing of right lobe middle lobe pneumonia since the prior study.   Original Report Authenticated By: Camelia Phenes, M.D.    Dg Foot Complete Right  12/24/2011  *RADIOLOGY REPORT*  Clinical Data: Cellulitis fourth and fifth toes  RIGHT FOOT COMPLETE - 3+ VIEW  Comparison: None.  Findings: Negative for fracture.  Negative for osteomyelitis.  No cortical erosion.  No significant arthropathy.  There is soft tissue swelling of the fourth and fifth toes.  IMPRESSION: Negative for fracture or osteomyelitis.   Original Report Authenticated By: Camelia Phenes, M.D.    Medications: I have reviewed the patient's current medications.  Scheduled Meds:   . efavirenz-emtricitabine-tenofovir  1 tablet Oral QHS  . enoxaparin (LOVENOX) injection  40 mg Subcutaneous Daily  . ketorolac  10 mg Oral Q6H  . vancomycin  750 mg Intravenous Q12H  . DISCONTD: aluminum sulfate-calcium acetate  1 packet Topical TID  . DISCONTD: clindamycin (CLEOCIN) IV  600 mg Intravenous Q8H  . DISCONTD: efavirenz-emtricitabine-tenofovir  1 tablet Oral Daily  . DISCONTD: ketorolac  15 mg Intravenous Q6H  . DISCONTD: tolnaftate   Topical BID   Continuous Infusions:   . sodium chloride 75 mL/hr at 12/25/11 0359   PRN Meds:.acetaminophen, DISCONTD: traMADol Assessment/Plan: 51yo M with h/o HIV who presents with right foot cellulitis x 1 week and 4-5th toe abscesses noticed last night.   1. Cellulitis and abscess of toe: Cellulitis x 1  week with subsequent abscess development. Possible from trauma due to walking distances in steel toes boots, denies any other trauma. Abscesses possible from the 4th and 5th toes rubbing together. Pt started on Clindamycin in the ED, which did seem to improve the cellulitis. Cultures of the wound were sent and + for abundant S. aureus. Blood cultures are pending. Pt on Vancomycin, and cellulitis appears to be improving.  - Continue IV Vancomycin per Pharmacy  - F/u wound and blood cx  - Wound care  - Toradol for pain control  2. HIV: On Atripla, and endorses compliance with the medication. Last CD4 count 1070 and viral load 41.  - Continue Atripla daily   3. Previous CAP: Treated with Levaquin x 7 days, starting 11/20/11. Pt is a smoker, and was smoking 10 cigs.day, but states that he has not smoked in 2 weeks. He denies any cough, fever, or respiratory sx. CXR shows complete resolution of CAP.   4. F/E/N:  - NS @ 75cc/hr  - Regular diet   5. DVT PPx: [place] Lovenox     LOS: 2 days   Genelle Gather 12/25/2011, 10:54 AM

## 2011-12-26 DIAGNOSIS — L02619 Cutaneous abscess of unspecified foot: Principal | ICD-10-CM

## 2011-12-26 DIAGNOSIS — A4902 Methicillin resistant Staphylococcus aureus infection, unspecified site: Secondary | ICD-10-CM

## 2011-12-26 DIAGNOSIS — B20 Human immunodeficiency virus [HIV] disease: Secondary | ICD-10-CM

## 2011-12-26 LAB — WOUND CULTURE

## 2011-12-26 LAB — CBC
Hemoglobin: 11.8 g/dL — ABNORMAL LOW (ref 13.0–17.0)
MCH: 31.3 pg (ref 26.0–34.0)
Platelets: 230 10*3/uL (ref 150–400)
RBC: 3.77 MIL/uL — ABNORMAL LOW (ref 4.22–5.81)
WBC: 8 10*3/uL (ref 4.0–10.5)

## 2011-12-26 MED ORDER — CLINDAMYCIN HCL 300 MG PO CAPS: 300.0000 mg | ORAL_CAPSULE | Freq: Three times a day (TID) | ORAL | Status: DC

## 2011-12-26 MED ORDER — KETOROLAC TROMETHAMINE 10 MG PO TABS: 10.0000 mg | ORAL_TABLET | Freq: Four times a day (QID) | ORAL | Status: DC

## 2011-12-26 MED ORDER — IBUPROFEN 600 MG PO TABS: 600.0000 mg | ORAL_TABLET | Freq: Four times a day (QID) | ORAL | Status: DC | PRN

## 2011-12-26 MED ORDER — DOXYCYCLINE HYCLATE 100 MG PO TABS: 100.0000 mg | ORAL_TABLET | Freq: Two times a day (BID) | ORAL | Status: DC

## 2011-12-26 MED ORDER — SULFAMETHOXAZOLE-TMP DS 800-160 MG PO TABS: 1.0000 | ORAL_TABLET | Freq: Two times a day (BID) | ORAL | Status: DC

## 2011-12-26 NOTE — Progress Notes (Addendum)
Subjective: Feeling much better, pain is significantly improved. No other complaints this morning.   Objective: Vital signs in last 24 hours: Filed Vitals:   12/25/11 0622 12/25/11 1413 12/25/11 2105 12/26/11 0613  BP: 100/55 121/75 113/75 106/66  Pulse: 69 62 73 61  Temp: 98.1 F (36.7 C) 98.6 F (37 C) 98.6 F (37 C) 98 F (36.7 C)  TempSrc:  Oral Oral   Resp: 18 16 18 18   Height:      Weight:      SpO2: 100% 100% 99% 100%   Weight change:   Intake/Output Summary (Last 24 hours) at 12/26/11 0806 Last data filed at 12/26/11 1610  Gross per 24 hour  Intake    480 ml  Output   1600 ml  Net  -1120 ml   Vitals reviewed.  General: Resting comfortably bed, NAD  HEENT: PERRL, EOMI, no scleral icterus  Cardiac: RRR, no rubs, murmurs or gallops  Pulm: Clear to auscultation bilaterally, no wheezes, rales, or rhonchi  Abd: Soft, nontender, nondistended  Ext: Warm and well perfused, no pedal edema. Right foot wrapped in curlex, clean and dry, no abscess noted between right 4 & 5th toes  Neuro: Alert and oriented X3, cranial nerves II-XII grossly intact, strength and sensation to light touch equal in bilateral upper and lower extremities.  Lab Results: Basic Metabolic Panel:  Lab 12/23/11 9604  NA 140  K 3.9  CL 104  CO2 26  GLUCOSE 102*  BUN 9  CREATININE 0.73  CALCIUM 8.8  MG --  PHOS --   CBC:  Lab 12/26/11 0542 12/25/11 0720 12/23/11 1129  WBC 8.0 8.2 --  NEUTROABS -- -- 6.8  HGB 11.8* 12.4* --  HCT 34.2* 36.3* --  MCV 90.7 91.4 --  PLT 230 215 --   Micro Results: Recent Results (from the past 240 hour(s))  WOUND CULTURE     Status: Normal (Preliminary result)   Collection Time   12/23/11 12:04 PM      Component Value Range Status Comment   Specimen Description WOUND RIGHT FOOT   Final    Special Requests NONE   Final    Gram Stain     Final    Value: RARE WBC PRESENT, PREDOMINANTLY PMN     RARE SQUAMOUS EPITHELIAL CELLS PRESENT     MODERATE GRAM  POSITIVE COCCI IN CLUSTERS     IN PAIRS   Culture     Final    Value: ABUNDANT STAPHYLOCOCCUS AUREUS     Note: RIFAMPIN AND GENTAMICIN SHOULD NOT BE USED AS SINGLE DRUGS FOR TREATMENT OF STAPH INFECTIONS.   Report Status PENDING   Incomplete   CULTURE, BLOOD (ROUTINE X 2)     Status: Normal (Preliminary result)   Collection Time   12/24/11  1:30 PM      Component Value Range Status Comment   Specimen Description BLOOD ARM RIGHT   Final    Special Requests BOTTLES DRAWN AEROBIC AND ANAEROBIC 10CC   Final    Culture  Setup Time 12/24/2011 18:52   Final    Culture     Final    Value:        BLOOD CULTURE RECEIVED NO GROWTH TO DATE CULTURE WILL BE HELD FOR 5 DAYS BEFORE ISSUING A FINAL NEGATIVE REPORT   Report Status PENDING   Incomplete   CULTURE, BLOOD (ROUTINE X 2)     Status: Normal (Preliminary result)   Collection Time   12/24/11  1:40  PM      Component Value Range Status Comment   Specimen Description BLOOD ARM RIGHT   Final    Special Requests     Final    Value: BOTTLES DRAWN AEROBIC AND ANAEROBIC 8CC BLUE, 4CC RED   Culture  Setup Time 12/24/2011 18:52   Final    Culture     Final    Value:        BLOOD CULTURE RECEIVED NO GROWTH TO DATE CULTURE WILL BE HELD FOR 5 DAYS BEFORE ISSUING A FINAL NEGATIVE REPORT   Report Status PENDING   Incomplete    Studies/Results: X-ray Chest Pa And Lateral   12/24/2011  *RADIOLOGY REPORT*  Clinical Data: Follow up pneumonia  CHEST - 2 VIEW  Comparison: 11/20/2011  Findings: Right middle lobe pneumonia has completely cleared.  No residual infiltrate or effusion.  Hyperinflation of the lungs suggesting asthma/COPD.  Negative for mass lesion.  IMPRESSION: Pulmonary hyperinflation.  Complete clearing of right lobe middle lobe pneumonia since the prior study.   Original Report Authenticated By: Camelia Phenes, M.D.    Dg Foot Complete Right  12/24/2011  *RADIOLOGY REPORT*  Clinical Data: Cellulitis fourth and fifth toes  RIGHT FOOT COMPLETE - 3+  VIEW  Comparison: None.  Findings: Negative for fracture.  Negative for osteomyelitis.  No cortical erosion.  No significant arthropathy.  There is soft tissue swelling of the fourth and fifth toes.  IMPRESSION: Negative for fracture or osteomyelitis.   Original Report Authenticated By: Camelia Phenes, M.D.    Medications: reviewed Scheduled Meds:   . efavirenz-emtricitabine-tenofovir  1 tablet Oral QHS  . enoxaparin (LOVENOX) injection  40 mg Subcutaneous Daily  . ketorolac  10 mg Oral Q6H  . vancomycin  750 mg Intravenous Q12H   Continuous Infusions:   . sodium chloride 75 mL/hr at 12/25/11 1924   PRN Meds:.acetaminophen Assessment/Plan: 51yo M with h/o HIV who presents with right foot cellulitis x 1 week and 4-5th toe abscesses noticed day prior to admission.   1. Cellulitis and abscess of toe: Cellulitis x 1 week with subsequent abscess development. Possible from trauma due to walking distances in steel toes boots, denies any other trauma. Abscesses possible from the 4th and 5th toes rubbing together. Pt started on Clindamycin in the ED, which did seem to improve the cellulitis. Xray of right foot neg for osteomyelitis.  Cultures of the wound were sent and + for abundant S. Aureus (MRSA). Blood cultures prelim result is neg. Pt on Vancomycin x 2 days, and cellulitis continues to improve.  - Will change to PO abx: doxycycline  x 10 more days to cover for MRSA since he wound culture showed resistant to Bactrim but is sensitive to tetracycline. - Wound care consult but they have not seen patient - Give Ibuprofen for pain as outpatient - He will follow up with outpatient clinic on Friday 10/25 @ 2:30PM   2. HIV: On Atripla, and endorses compliance with the medication. Last CD4 count 1070 and viral load 41 in 11/2011.  - Continue Atripla daily   3. Previous CAP: Treated with Levaquin x 7 days, starting 11/20/11. Pt is a smoker, and was smoking 10 cigs.day, but states that he has not smoked in  2 weeks. He denies any cough, fever, or respiratory sx. CXR shows complete resolution of CAP.   4. F/E/N:  - Regular diet   5. DVT PPx: [place] Lovenox   Dispo: home today and follow up with Dr. Manson Passey  on 10/25 @ 2:30PM Internal Medicine outpatient clinic    LOS: 3 days   Christopher Farrell 12/26/2011, 8:06 AM

## 2011-12-26 NOTE — Progress Notes (Signed)
Patient discharged to home in care of self. Medications provided to patient and reviewed as well as instructions and foot care. Assessment unchanged from this am. IV d/c'd with cath intact. Patient is to follow up with Dr. Manson Passey in the IM clinic on 10.25.13.

## 2011-12-26 NOTE — Consult Note (Signed)
Wound care:  Pt discharged prior to [facility] consult.  No longer in hospital at this time. Will not plan to follow further unless re-consulted.  [address] RN, MSN, [employer]  [phone]

## 2011-12-26 NOTE — Discharge Summary (Signed)
Internal Medicine Teaching [facility] Discharge Note  Name: Christopher Farrell MRN: [id number] DOB: 1961-01-14 51 y.o.  Date of Admission: 12/23/2011 10:41 AM Date of Discharge: 12/26/2011 Attending Physician: No att. providers found  Discharge Diagnosis: Principal Problem: Cellulitis and abscess of right 4 & 5th toes HIV   Discharge Medications:   Medication List     As of 12/26/2011  5:26 PM    TAKE these medications         doxycycline 100 MG tablet   Commonly known as: VIBRA-TABS   Take 1 tablet (100 mg total) by mouth 2 (two) times daily.      efavirenz-emtricitabine-tenofovir 600-200-300 MG per tablet   Commonly known as: ATRIPLA   Take 1 tablet by mouth daily.      ibuprofen 600 MG tablet   Commonly known as: ADVIL,MOTRIN   Take 1 tablet (600 mg total) by mouth every 6 (six) hours as needed for pain.        Disposition and follow-up:   Christopher Farrell was discharged from [facility] in improved and stable condition.  At the hospital follow up visit please address  1. Right LE Cellulitis and abscess of 4&5th toes- he needs to complete 10 days of Doxycycline for MRSA.  If no improvement or worsen, may need longer course of abx or MRI to look for osteomyelitis.     Follow-up Appointments:     Follow-up Information    Follow up with Janalyn Harder, MD. On 12/30/2011. (@ 2:30PM)    Contact information:   [address] [place] [address] [phone]         Discharge Orders    Future Appointments: Provider: Department: Dept Phone: Center:   12/30/2011 2:30 PM Linward Headland, MD [facility] [phone] [facility]   03/05/2012 10:00 AM [facility] [phone] [facility]   03/19/2012 10:00 AM Randall Hiss, MD [facility] [phone] [facility]     Future Orders Please Complete By Expires   Increase activity slowly      Discharge wound care:      Comments:   Keep wound clean and  change dressing daily, wet to dry      Consultations:    Procedures Performed:  X-ray Chest Pa And Lateral   12/24/2011  *RADIOLOGY REPORT*  Clinical Data: Follow up pneumonia  CHEST - 2 VIEW  Comparison: 11/20/2011  Findings: Right middle lobe pneumonia has completely cleared.  No residual infiltrate or effusion.  Hyperinflation of the lungs suggesting asthma/COPD.  Negative for mass lesion.  IMPRESSION: Pulmonary hyperinflation.  Complete clearing of right lobe middle lobe pneumonia since the prior study.   Original Report Authenticated By: Camelia Phenes, M.D.    Dg Foot Complete Right  12/24/2011  *RADIOLOGY REPORT*  Clinical Data: Cellulitis fourth and fifth toes  RIGHT FOOT COMPLETE - 3+ VIEW  Comparison: None.  Findings: Negative for fracture.  Negative for osteomyelitis.  No cortical erosion.  No significant arthropathy.  There is soft tissue swelling of the fourth and fifth toes.  IMPRESSION: Negative for fracture or osteomyelitis.   Original Report Authenticated By: Camelia Phenes, M.D.    Admission HPI: [name] is a 52yo M w/ h/p HIV, on Atripla with last CD4 count of 1070 and viral load 41, treated for CAP in September by Dr. Daiva Eves, who presents with cellulitis in his right foot x 1 week and recent  abscess formation with rupture encompassing his 4-5th right toes since yesterday. He states that he was having some redness on the dorsum of his foot with some tenderness. He denies any pain in his toes until Friday, when he noticed blood and pus oozing from his right 4th and 5th toes. He states that the redness was beginning travel up his leg, so he decided to come to the ED to have his foot evaluated. He denies any trauma to the foot, but endorses wearing steeled toed boots for work, which he walks 2 hours back home from work because his mother is unable to give him a ride late at night. He does always wear socks. He denies any fevers, chills, other areas of erythema, joint pain,  headaches, chest pain, difficulty breathing, SOB, N/V, diarrhea, or trouble urinating.  Hospital Course by problem list:  1. Cellulitis and abscess of toe: Cellulitis x 1 week with subsequent abscess development with spontaneous drainage of purulent material. Possible from trauma due to walking distances in steel toes boots, denies any other trauma. Abscesses possible from the 4th and 5th toes rubbing together. Pt started on Clindamycin in the ED, which did seem to improve the cellulitis.  Xray of right foot neg for osteomyelitis. Cultures of the wound were sent and + for abundant S. Aureus (MRSA) . Blood cultures prelim result is neg.  Pt on Vancomycin x 3 days. He was discharged on PO regimen of doxycycline x 10 more days to cover for MRSA since he wound culture showed resistant to Bactrim but is sensitive to tetracycline. We consulted wound care but they did not see the patient but his abscess and cellulitis improved significantly so nursing staff taught him how to do wet to dry dressing at home and to keep it clean.  I also asked him to not wear his old steel toed boot because he would get re-infected.  He will ask his work if they can buy plastic boot for him to wear at work.  As for pain control, we gave him Toradol for pain as well as inflammation which worked well for him.  I will send him home with 5 days of Toradol and also ibuprofen 600mg  PRN thereafter. He will follow up with outpatient clinic on Friday 10/25 @ 2:30PM with Dr. Manson Passey.  2. HIV: On Atripla, and endorses compliance with the medication. Last CD4 count 1070 and viral load 41 in 11/2011.  - Continue Atripla daily  3. Previous CAP: Treated with Levaquin x 7 days, starting 11/20/11. Pt is a smoker, and was smoking 10 cigs.day, but states that he has not smoked in 2 weeks. He denies any cough, fever, or respiratory sx. CXR shows complete resolution of CAP.   4. F/E/N:  - Regular diet    Discharge Vitals:  BP 116/82  Pulse 80  Temp  98 F (36.7 C) (Oral)  Resp 18  Ht 5\' 6"  (1.676 m)  Wt 130 lb (58.968 kg)  BMI 20.98 kg/m2  SpO2 100%  Discharge Labs:  Results for orders placed during the hospital encounter of 12/23/11 (from the past 24 hour(s))  CBC     Status: Abnormal   Collection Time   12/26/11  5:42 AM      Component Value Range   WBC 8.0  4.0 - 10.5 K/uL   RBC 3.77 (*) 4.22 - 5.81 MIL/uL   Hemoglobin 11.8 (*) 13.0 - 17.0 g/dL   HCT 16.1 (*) 09.6 - 04.5 %   MCV 90.7  78.0 - 100.0 fL   MCH 31.3  26.0 - 34.0 pg   MCHC 34.5  30.0 - 36.0 g/dL   RDW 16.1  09.6 - 04.5 %   Platelets 230  150 - 400 K/uL    Signed: Lavontay Farrell 12/26/2011, 5:26 PM   Time Spent on Discharge: <30 minutes

## 2011-12-26 NOTE — Progress Notes (Addendum)
Internal Medicine Teaching Service Attending Note Date: 12/26/2011  Patient name: Christopher Farrell  Medical record number: [id number]  Date of birth: [birth year]   I have seen and evaluated [name] and discussed their care with the Residency Team.   Without complaints HIV 1 RNA Quant (copies/mL)  Date Value  10/31/2011 41*  10/21/2010 <20   12/08/2009 <20 copies/mL      CD4 T Cell Abs (cmm)  Date Value  10/31/2011 1070   10/21/2010 1070   12/08/2009 1380        . efavirenz-emtricitabine-tenofovir  1 tablet Oral QHS  . enoxaparin (LOVENOX) injection  40 mg Subcutaneous Daily  . ketorolac  10 mg Oral Q6H  . vancomycin  750 mg Intravenous Q12H     Physical Exam: Blood pressure 125/74, pulse 73, temperature 98.4 F (36.9 C), temperature source Oral, resp. rate 19, height 5\' 6"  (1.676 m), weight 58.968 kg (130 lb), SpO2 99.00%. General appearance: alert, cooperative and no distress Resp: clear to auscultation bilaterally Cardio: regular rate and rhythm GI: normal findings: bowel sounds normal and soft, non-tender Extremities: wound between R 4th and 5th toes. clean. no d/c. minimal pain.  Lab results: Results for orders placed during the hospital encounter of 12/23/11 (from the past 24 hour(s))  CBC     Status: Abnormal   Collection Time   12/26/11  5:42 AM      Component Value Range   WBC 8.0  4.0 - 10.5 K/uL   RBC 3.77 (*) 4.22 - 5.81 MIL/uL   Hemoglobin 11.8 (*) 13.0 - 17.0 g/dL   HCT 40.9 (*) 81.1 - 91.4 %   MCV 90.7  78.0 - 100.0 fL   MCH 31.3  26.0 - 34.0 pg   MCHC 34.5  30.0 - 36.0 g/dL   RDW 78.2  95.6 - 21.3 %   Platelets 230  150 - 400 K/uL    Imaging results:  X-ray Chest Pa And Lateral   12/24/2011  *RADIOLOGY REPORT*  Clinical Data: Follow up pneumonia  CHEST - 2 VIEW  Comparison: 11/20/2011  Findings: Right middle lobe pneumonia has completely cleared.  No residual infiltrate or effusion.  Hyperinflation of the lungs suggesting asthma/COPD.   Negative for mass lesion.  IMPRESSION: Pulmonary hyperinflation.  Complete clearing of right lobe middle lobe pneumonia since the prior study.   Original Report Authenticated By: Camelia Phenes, M.D.    Dg Foot Complete Right  12/24/2011  *RADIOLOGY REPORT*  Clinical Data: Cellulitis fourth and fifth toes  RIGHT FOOT COMPLETE - 3+ VIEW  Comparison: None.  Findings: Negative for fracture.  Negative for osteomyelitis.  No cortical erosion.  No significant arthropathy.  There is soft tissue swelling of the fourth and fifth toes.  IMPRESSION: Negative for fracture or osteomyelitis.   Original Report Authenticated By: Camelia Phenes, M.D.     Assessment and Plan: I agree with the formulated Assessment and Plan with the following changes:   MRSA Abscess (R- erythro, Ox, Pen, Bactrim; I-lvq) HIV+  Will plan for him to be switched to oral doxy. His plain films did not show osteo.  He has f/u in IM clinic later this week, my great appreciation to them.

## 2011-12-27 ENCOUNTER — Telehealth: Payer: Self-pay | Admitting: Dietician

## 2011-12-27 NOTE — Telephone Encounter (Signed)
Left message

## 2011-12-28 NOTE — Telephone Encounter (Addendum)
Discharge date:12-26-11 Call date: 12-27-11 and 12-28-11 Hospital follow up appointment date: Friday 12-30-11 with Dr. Manson Passey at 2:30 PM  Calling to assist with transition of care from hospital to home.  Discharge medications reviewed:yes  Able to fill all prescriptions?had not run out and has them all and is taking them  Patient aware of hospital follow up appointments. Yes- stated it back   No problems with transportation. Patient denied  Other problems/concerns: patient denied

## 2011-12-30 ENCOUNTER — Ambulatory Visit (INDEPENDENT_AMBULATORY_CARE_PROVIDER_SITE_OTHER): Payer: Self-pay | Admitting: Internal Medicine

## 2011-12-30 ENCOUNTER — Encounter: Payer: Self-pay | Admitting: Internal Medicine

## 2011-12-30 VITALS — BP 102/66 | HR 91 | Temp 98.3°F | Ht 64.0 in | Wt 134.1 lb

## 2011-12-30 DIAGNOSIS — L03039 Cellulitis of unspecified toe: Secondary | ICD-10-CM

## 2011-12-30 LAB — CULTURE, BLOOD (ROUTINE X 2): Culture: NO GROWTH

## 2011-12-30 MED ORDER — KETOROLAC TROMETHAMINE 10 MG PO TABS: 10.0000 mg | ORAL_TABLET | Freq: Four times a day (QID) | ORAL | Status: DC | PRN

## 2011-12-30 NOTE — Patient Instructions (Signed)
Your foot is healing well.  I've renewed your prescription for Ketorolac. -continue to take doxycycline, twice per day  I'd like to see you back after you've completed your course of doxycycline, to make sure your wound has resolved.  Please return sometime Nov 4-8th for another 1-time visit.

## 2011-12-30 NOTE — Assessment & Plan Note (Signed)
The patient presents for a 1-time clinic hospital follow-up (otherwise followed at ID clinic).  Based on prior descriptions, his foot appears much improved, though still with some erythema and exudate present. -patient instructed to continue full course of doxycycline -I'd like to be cautious and have the patient return after completing his course of antibiotics, to ensure that the cellulitis/abscess has completely resolved.  If not, patient may warrant a longer course of antibiotics.  Patient to return for another 1-time visit in 1-2 weeks -toradol prescription refilled

## 2011-12-30 NOTE — Progress Notes (Signed)
HPI The patient is a 51 y.o. yo male with a history of HIV, presenting for a 1-time visit for hospital follow-up.  The patient was recently hospitalized 10/18-10/21 with acute cellulitis of the 4th-5th right toes with abscess formation, culture-positive for MRSA.  The patient was discharged on a 10-day course of doxycycline, currently day 4/10.  The patient notes that his foot is feeling better, and that it is no longer painful to walk on his foot.  The swelling and redness of his foot have also gone down.  He still notes exudate from the lesion between his toes.  No fevers/chills.  He asks for a refill of toradol for pain.  ROS: General: no fevers, chills, changes in weight, changes in appetite Skin: no rash HEENT: no blurry vision, hearing changes, sore throat Pulm: no dyspnea, coughing, wheezing CV: no chest pain, palpitations, shortness of breath Abd: no abdominal pain, nausea/vomiting, diarrhea/constipation GU: no dysuria, hematuria, polyuria Ext: see HPI Neuro: no weakness, numbness, or tingling  Filed Vitals:   12/30/11 1444  BP: 102/66  Pulse: 91  Temp: 98.3 F (36.8 C)    PEX General: alert, cooperative, and in no apparent distress HEENT: pupils equal round and reactive to light, vision grossly intact, oropharynx clear and non-erythematous  Neck: supple, no lymphadenopathy Lungs: clear to ascultation bilaterally, normal work of respiration, no wheezes, rales, ronchi Heart: regular rate and rhythm, no murmurs, gallops, or rubs Abdomen: soft, non-tender, non-distended, normal bowel sounds Extremities: right foot with no edema.  Mild erythema noted in interdigit space between right 4th and 5th toes, with white exudate present on toes appearing to originate from inter-toe web space.  No pain elicited on movement of 4th and 5th toes. Neurologic: alert & oriented X3, cranial nerves II-XII intact, strength grossly intact, sensation intact to light touch  Current Outpatient  Prescriptions on File Prior to Visit  Medication Sig Dispense Refill  . doxycycline (VIBRA-TABS) 100 MG tablet Take 1 tablet (100 mg total) by mouth 2 (two) times daily.  20 tablet  0  . efavirenz-emtricitabine-tenofovir (ATRIPLA) 600-200-300 MG per tablet Take 1 tablet by mouth daily.      [name] ibuprofen (ADVIL,MOTRIN) 600 MG tablet Take 1 tablet (600 mg total) by mouth every 6 (six) hours as needed for pain.  30 tablet  0    Assessment/Plan

## 2012-01-12 ENCOUNTER — Encounter: Payer: Self-pay | Admitting: Internal Medicine

## 2012-01-12 ENCOUNTER — Ambulatory Visit (INDEPENDENT_AMBULATORY_CARE_PROVIDER_SITE_OTHER): Payer: Self-pay | Admitting: Internal Medicine

## 2012-01-12 VITALS — BP 111/77 | HR 97 | Temp 98.2°F | Ht 64.0 in | Wt 130.8 lb

## 2012-01-12 DIAGNOSIS — L02619 Cutaneous abscess of unspecified foot: Secondary | ICD-10-CM

## 2012-01-12 DIAGNOSIS — B20 Human immunodeficiency virus [HIV] disease: Secondary | ICD-10-CM

## 2012-01-12 NOTE — Patient Instructions (Signed)
Your toe abscess and infection is totally healed. You do not need any more antibiotics or further treatment. Continue followup with Dr. Algis Liming at the ID clinic.

## 2012-01-12 NOTE — Progress Notes (Signed)
  Subjective:    Patient ID: Christopher Farrell, male    DOB: 1960-08-16, 51 y.o.   MRN: [id number]  HPI patient is a pleasant 51 year old man with past history of HIV infection, recent cellulitis and abscess of right fourth and fifth toe comes to the clinic for her second followup visit. His infection and abscess are completely resolved. There is no redness or drainage. He does not have any pain and has no problem walking. He denies any fever, chills, nausea vomiting, abdominal pain, chest pain. He is followed by Dr. Algis Liming at ID clinic.    Review of Systems    as per history of present illness, all other systems reviewed and negative. Objective:   Physical Exam  General: NAD HEENT: PERRL, EOMI, no scleral icterus Cardiac: S1, S2, RRR, no rubs, murmurs or gallops Pulm: clear to auscultation bilaterally, moving normal volumes of air Abd: soft, nontender, nondistended, BS present Ext: warm and well perfused, no pedal edema. Right fifth and fourth toe infection completely healed. Dry skin noted but no redness or discharge or pain. Neuro: alert and oriented X3, cranial nerves II-XII grossly intact       Assessment & Plan:

## 2012-01-12 NOTE — Assessment & Plan Note (Signed)
Completed resolved. No more intervention needed at this point of time.

## 2012-01-12 NOTE — Assessment & Plan Note (Signed)
Extremely well controlled. On Atripla. Continue followup with Dr. Algis Liming at ID clinic.

## 2012-03-05 ENCOUNTER — Other Ambulatory Visit (INDEPENDENT_AMBULATORY_CARE_PROVIDER_SITE_OTHER): Payer: Self-pay

## 2012-03-05 DIAGNOSIS — B20 Human immunodeficiency virus [HIV] disease: Secondary | ICD-10-CM

## 2012-03-05 LAB — COMPLETE METABOLIC PANEL WITH GFR
ALT: 16 U/L (ref 0–53)
Albumin: 4.6 g/dL (ref 3.5–5.2)
CO2: 24 mEq/L (ref 19–32)
Chloride: 107 mEq/L (ref 96–112)
GFR, Est African American: >89 mL/min
GFR, Est Non African American: >89 mL/min
Glucose, Bld: 87 mg/dL (ref 70–99)
Potassium: 4.1 mEq/L (ref 3.5–5.3)
Sodium: 140 mEq/L (ref 135–145)
Total Protein: 6.6 g/dL (ref 6.0–8.3)

## 2012-03-05 LAB — CBC WITH DIFFERENTIAL/PLATELET
Basophils Absolute: 0.1 10*3/uL (ref 0.0–0.1)
Eosinophils Relative: 8 % — ABNORMAL HIGH (ref 0–5)
HCT: 40.8 % (ref 39.0–52.0)
Lymphocytes Relative: 33 % (ref 12–46)
Lymphs Abs: 2.8 10*3/uL (ref 0.7–4.0)
MCV: 92.3 fL (ref 78.0–100.0)
Monocytes Absolute: 0.5 10*3/uL (ref 0.1–1.0)
Neutro Abs: 4.3 10*3/uL (ref 1.7–7.7)
RBC: 4.42 MIL/uL (ref 4.22–5.81)
RDW: 14.4 % (ref 11.5–15.5)
WBC: 8.3 10*3/uL (ref 4.0–10.5)

## 2012-03-05 LAB — LIPID PANEL
HDL: 58 mg/dL (ref >39)
LDL Cholesterol: 51 mg/dL (ref 0–99)

## 2012-03-05 LAB — RPR

## 2012-03-06 LAB — HIV-1 RNA QUANT-NO REFLEX-BLD: HIV-1 RNA Quant, Log: <1.3 {Log} (ref <1.30)

## 2012-03-06 LAB — T-HELPER CELL (CD4) - (RCID CLINIC ONLY): CD4 T Cell Abs: 640 uL (ref 400–2700)

## 2012-03-19 ENCOUNTER — Ambulatory Visit (INDEPENDENT_AMBULATORY_CARE_PROVIDER_SITE_OTHER): Payer: Self-pay | Admitting: Infectious Disease

## 2012-03-19 ENCOUNTER — Encounter: Payer: Self-pay | Admitting: Infectious Disease

## 2012-03-19 VITALS — BP 96/65 | HR 75 | Temp 98.4°F | Wt 128.0 lb

## 2012-03-19 DIAGNOSIS — B20 Human immunodeficiency virus [HIV] disease: Secondary | ICD-10-CM

## 2012-03-19 DIAGNOSIS — B9562 Methicillin resistant Staphylococcus aureus infection as the cause of diseases classified elsewhere: Secondary | ICD-10-CM

## 2012-03-19 DIAGNOSIS — A4902 Methicillin resistant Staphylococcus aureus infection, unspecified site: Secondary | ICD-10-CM

## 2012-03-19 DIAGNOSIS — Z87891 Personal history of nicotine dependence: Secondary | ICD-10-CM

## 2012-03-19 NOTE — Progress Notes (Signed)
  Subjective:    Patient ID: Christopher Farrell, male    DOB: [birth year], 52 y.o.   MRN: [id number]  HPI  Christopher Farrell is a 52 y.o. male who is doing superbly well on his  antiviral regimen, atripla with undetectable viral load and health cd4 count. He has quit smoking since October. He is not currently sexually active with anyone but counselled to use condoms to inform partners and know his partner's status. He has had no recurrence of MRSA infectionin th efoot or elsewhere.   Review of Systems  Constitutional: Negative for fever, chills, diaphoresis, activity change, appetite change, fatigue and unexpected weight change.  HENT: Negative for congestion, sore throat, rhinorrhea, sneezing, trouble swallowing and sinus pressure.   Eyes: Negative for photophobia and visual disturbance.  Respiratory: Negative for cough, chest tightness, shortness of breath, wheezing and stridor.   Cardiovascular: Negative for chest pain, palpitations and leg swelling.  Gastrointestinal: Negative for nausea, vomiting, abdominal pain, diarrhea, constipation, blood in stool, abdominal distention and anal bleeding.  Genitourinary: Negative for dysuria, hematuria, flank pain and difficulty urinating.  Musculoskeletal: Negative for myalgias, back pain, joint swelling, arthralgias and gait problem.  Skin: Negative for color change, pallor, rash and wound.  Neurological: Negative for dizziness, tremors, weakness and light-headedness.  Hematological: Negative for adenopathy. Does not bruise/bleed easily.  Psychiatric/Behavioral: Negative for behavioral problems, confusion, sleep disturbance, dysphoric mood, decreased concentration and agitation.       Objective:   Physical Exam  Constitutional: He is oriented to person, place, and time. He appears well-developed and well-nourished. No distress.  HENT:  Head: Normocephalic and atraumatic.  Mouth/Throat: Oropharynx is clear and moist. No oropharyngeal exudate.    Eyes: Conjunctivae normal and EOM are normal. Pupils are equal, round, and reactive to light. No scleral icterus.  Neck: Normal range of motion. Neck supple. No JVD present.  Cardiovascular: Normal rate, regular rhythm and normal heart sounds.  Exam reveals no gallop and no friction rub.   No murmur heard. Pulmonary/Chest: Effort normal and breath sounds normal. No respiratory distress. He has no wheezes. He has no rales. He exhibits no tenderness.  Abdominal: He exhibits no distension and no mass. There is no tenderness. There is no rebound and no guarding.  Musculoskeletal: He exhibits no edema and no tenderness.  Lymphadenopathy:    He has no cervical adenopathy.  Neurological: He is alert and oriented to person, place, and time. He exhibits normal muscle tone. Coordination normal.  Skin: Skin is warm and dry. He is not diaphoretic. No erythema. No pallor.  Psychiatric: He has a normal mood and affect. His behavior is normal. Judgment and thought content normal.          Assessment & Plan:  HIV: perfect control  Former smoker: quit since October  MRSA infection: no recurrence recently. If recurs decolonization regimen

## 2012-06-06 ENCOUNTER — Ambulatory Visit: Payer: Self-pay

## 2012-11-12 ENCOUNTER — Ambulatory Visit: Payer: Self-pay

## 2012-11-12 ENCOUNTER — Other Ambulatory Visit (INDEPENDENT_AMBULATORY_CARE_PROVIDER_SITE_OTHER): Payer: Self-pay

## 2012-11-12 DIAGNOSIS — B20 Human immunodeficiency virus [HIV] disease: Secondary | ICD-10-CM

## 2012-11-12 DIAGNOSIS — Z113 Encounter for screening for infections with a predominantly sexual mode of transmission: Secondary | ICD-10-CM

## 2012-11-12 DIAGNOSIS — Z79899 Other long term (current) drug therapy: Secondary | ICD-10-CM

## 2012-11-12 LAB — COMPLETE METABOLIC PANEL WITH GFR
AST: 19 U/L (ref 0–37)
Albumin: 3.9 g/dL (ref 3.5–5.2)
Alkaline Phosphatase: 64 U/L (ref 39–117)
BUN: 15 mg/dL (ref 6–23)
Potassium: 4.4 mEq/L (ref 3.5–5.3)
Sodium: 144 mEq/L (ref 135–145)
Total Bilirubin: 0.2 mg/dL — ABNORMAL LOW (ref 0.3–1.2)
Total Protein: 6.1 g/dL (ref 6.0–8.3)

## 2012-11-12 LAB — CBC WITH DIFFERENTIAL/PLATELET
Basophils Absolute: 0 10*3/uL (ref 0.0–0.1)
Eosinophils Relative: 2 % (ref 0–5)
HCT: 38 % — ABNORMAL LOW (ref 39.0–52.0)
Hemoglobin: 13 g/dL (ref 13.0–17.0)
Lymphocytes Relative: 36 % (ref 12–46)
MCV: 92.2 fL (ref 78.0–100.0)
Monocytes Absolute: 0.4 10*3/uL (ref 0.1–1.0)
Monocytes Relative: 5 % (ref 3–12)
Neutro Abs: 4.6 10*3/uL (ref 1.7–7.7)
RDW: 14 % (ref 11.5–15.5)
WBC: 8.1 10*3/uL (ref 4.0–10.5)

## 2012-11-12 LAB — LIPID PANEL
HDL: 64 mg/dL (ref >39)
Triglycerides: 52 mg/dL (ref <150)

## 2012-11-12 LAB — RPR

## 2012-11-13 LAB — HIV-1 RNA QUANT-NO REFLEX-BLD: HIV 1 RNA Quant: <20 copies/mL (ref <20)

## 2012-11-13 LAB — T-HELPER CELL (CD4) - (RCID CLINIC ONLY): CD4 % Helper T Cell: 37 % (ref 33–55)

## 2012-11-26 ENCOUNTER — Encounter: Payer: Self-pay | Admitting: Infectious Disease

## 2012-11-26 ENCOUNTER — Ambulatory Visit (INDEPENDENT_AMBULATORY_CARE_PROVIDER_SITE_OTHER): Payer: Self-pay | Admitting: Infectious Disease

## 2012-11-26 VITALS — BP 127/78 | HR 73 | Temp 98.5°F | Wt 127.0 lb

## 2012-11-26 DIAGNOSIS — B20 Human immunodeficiency virus [HIV] disease: Secondary | ICD-10-CM

## 2012-11-26 DIAGNOSIS — Z23 Encounter for immunization: Secondary | ICD-10-CM

## 2012-11-26 DIAGNOSIS — A4902 Methicillin resistant Staphylococcus aureus infection, unspecified site: Secondary | ICD-10-CM | POA: Insufficient documentation

## 2012-11-26 NOTE — Progress Notes (Signed)
  Subjective:    Patient ID: Christopher Farrell, male    DOB: 04-21-1960, 52 y.o.   MRN: [id number]  HPI   Christopher Farrell is a 52 y.o. male who is doing superbly well on his  antiviral regimen, atripla with undetectable viral load and health cd4 count. He has quit smoking since October 2013 He is not currently sexually active with anyone , He has started lifting weights again.  Review of Systems  Constitutional: Negative for fever, chills, diaphoresis, activity change, appetite change, fatigue and unexpected weight change.  HENT: Negative for congestion, sore throat, rhinorrhea, sneezing, trouble swallowing and sinus pressure.   Eyes: Negative for photophobia and visual disturbance.  Respiratory: Negative for cough, chest tightness, shortness of breath, wheezing and stridor.   Cardiovascular: Negative for chest pain, palpitations and leg swelling.  Gastrointestinal: Negative for nausea, vomiting, abdominal pain, diarrhea, constipation, blood in stool, abdominal distention and anal bleeding.  Genitourinary: Negative for dysuria, hematuria, flank pain and difficulty urinating.  Musculoskeletal: Negative for myalgias, back pain, joint swelling, arthralgias and gait problem.  Skin: Negative for color change, pallor, rash and wound.  Neurological: Negative for dizziness, tremors, weakness and light-headedness.  Hematological: Negative for adenopathy. Does not bruise/bleed easily.  Psychiatric/Behavioral: Negative for behavioral problems, confusion, sleep disturbance, dysphoric mood, decreased concentration and agitation.       Objective:   Physical Exam  Constitutional: He is oriented to person, place, and time. He appears well-developed and well-nourished. No distress.  HENT:  Head: Normocephalic and atraumatic.  Mouth/Throat: Oropharynx is clear and moist. No oropharyngeal exudate.  Eyes: Conjunctivae and EOM are normal. Pupils are equal, round, and reactive to light. No scleral  icterus.  Neck: Normal range of motion. Neck supple. No JVD present.  Cardiovascular: Normal rate, regular rhythm and normal heart sounds.  Exam reveals no gallop and no friction rub.   No murmur heard. Pulmonary/Chest: Effort normal and breath sounds normal. No respiratory distress. He has no wheezes. He has no rales. He exhibits no tenderness.  Abdominal: He exhibits no distension and no mass. There is no tenderness. There is no rebound and no guarding.  Musculoskeletal: He exhibits no edema and no tenderness.  Lymphadenopathy:    He has no cervical adenopathy.  Neurological: He is alert and oriented to person, place, and time. He exhibits normal muscle tone. Coordination normal.  Skin: Skin is warm and dry. He is not diaphoretic. No erythema. No pallor.  Psychiatric: He has a normal mood and affect. His behavior is normal. Judgment and thought content normal.          Assessment & Plan:  HIV: perfect control  Former smoker: quit since October 2013 commended him on this  MRSA infection: no recurrence recently. If recurs decolonization regimen  HCM: flu shot

## 2012-11-27 ENCOUNTER — Other Ambulatory Visit: Payer: Self-pay | Admitting: *Deleted

## 2012-11-27 DIAGNOSIS — B20 Human immunodeficiency virus [HIV] disease: Secondary | ICD-10-CM

## 2012-11-27 MED ORDER — EFAVIRENZ-EMTRICITAB-TENOFOVIR 600-200-300 MG PO TABS: 1.0000 | ORAL_TABLET | Freq: Every day | ORAL | Status: DC

## 2013-03-26 ENCOUNTER — Emergency Department (HOSPITAL_COMMUNITY): Payer: Self-pay

## 2013-03-26 ENCOUNTER — Encounter (HOSPITAL_COMMUNITY): Payer: Self-pay | Admitting: Emergency Medicine

## 2013-03-26 ENCOUNTER — Emergency Department (HOSPITAL_COMMUNITY)
Admission: EM | Admit: 2013-03-26 | Discharge: 2013-03-27 | Disposition: A | Discharge status: Home / Self Care | Payer: Self-pay | Attending: Emergency Medicine | Admitting: Emergency Medicine

## 2013-03-26 DIAGNOSIS — S7291XB Unspecified fracture of right femur, initial encounter for open fracture type I or II: Secondary | ICD-10-CM

## 2013-03-26 DIAGNOSIS — S0100XA Unspecified open wound of scalp, initial encounter: Secondary | ICD-10-CM | POA: Insufficient documentation

## 2013-03-26 DIAGNOSIS — Z8614 Personal history of Methicillin resistant Staphylococcus aureus infection: Secondary | ICD-10-CM | POA: Insufficient documentation

## 2013-03-26 DIAGNOSIS — S0191XA Laceration without foreign body of unspecified part of head, initial encounter: Secondary | ICD-10-CM

## 2013-03-26 DIAGNOSIS — F17200 Nicotine dependence, unspecified, uncomplicated: Secondary | ICD-10-CM | POA: Insufficient documentation

## 2013-03-26 DIAGNOSIS — S1093XA Contusion of unspecified part of neck, initial encounter: Secondary | ICD-10-CM

## 2013-03-26 DIAGNOSIS — Z21 Asymptomatic human immunodeficiency virus [HIV] infection status: Secondary | ICD-10-CM | POA: Insufficient documentation

## 2013-03-26 DIAGNOSIS — S7290XB Unspecified fracture of unspecified femur, initial encounter for open fracture type I or II: Secondary | ICD-10-CM | POA: Insufficient documentation

## 2013-03-26 DIAGNOSIS — S0083XA Contusion of other part of head, initial encounter: Secondary | ICD-10-CM

## 2013-03-26 DIAGNOSIS — Z79899 Other long term (current) drug therapy: Secondary | ICD-10-CM | POA: Insufficient documentation

## 2013-03-26 DIAGNOSIS — S0003XA Contusion of scalp, initial encounter: Secondary | ICD-10-CM | POA: Insufficient documentation

## 2013-03-26 MED ORDER — CEFAZOLIN SODIUM 1 G IJ SOLR
2.0000 g | Freq: Once | INTRAMUSCULAR | Status: AC
  Administered 2013-03-27: 2 g via INTRAMUSCULAR
  Filled 2013-03-26: qty 20
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
## 2013-03-26 NOTE — ED Notes (Signed)
Pt moved from hall to room 25  Pt clothes removed and complete assessment done  Pt has a 1inch lac noted to the left side of his head  Pt has bruising and abrasions noted to his right knee area  Pt has a small circular wound noted to his right anterior thigh

## 2013-03-26 NOTE — ED Notes (Signed)
Patient transported to X-ray 

## 2013-03-27 MED ORDER — CEPHALEXIN 500 MG PO CAPS: 500.0000 mg | ORAL_CAPSULE | Freq: Three times a day (TID) | ORAL | Status: DC

## 2013-03-27 MED ORDER — TRAMADOL HCL 50 MG PO TABS: 50.0000 mg | ORAL_TABLET | Freq: Four times a day (QID) | ORAL | Status: DC | PRN

## 2013-03-27 MED ORDER — STERILE WATER FOR INJECTION IJ SOLN
INTRAMUSCULAR | Status: AC
  Administered 2013-03-27: 5 mL
  Filled 2013-03-27: qty 10

## 2013-03-27 MED ORDER — OXYCODONE-ACETAMINOPHEN 5-325 MG PO TABS
2.0000 | ORAL_TABLET | Freq: Once | ORAL | Status: AC
  Administered 2013-03-27: 2 via ORAL
  Filled 2013-03-27: qty 2
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
## 2013-03-27 NOTE — Discharge Instructions (Signed)
Cast or Splint Care Casts and splints support injured limbs and keep bones from moving while they heal.  HOME CARE  Keep the cast or splint uncovered during the drying period.  A plaster cast can take 24 to 48 hours to dry.  A fiberglass cast will dry in less than 1 hour.  Do not rest the cast on anything harder than a pillow for 24 hours.  Do not put weight on your injured limb. Do not put pressure on the cast. Wait for your doctor's approval.  Keep the cast or splint dry.  Cover the cast or splint with a plastic bag during baths or wet weather.  If you have a cast over your chest and belly (trunk), take sponge baths until the cast is taken off.  If your cast gets wet, dry it with a towel or blow dryer. Use the cool setting on the blow dryer.  Keep your cast or splint clean. Wash a dirty cast with a damp cloth.  Do not put any objects under your cast or splint.  Do not scratch the skin under the cast with an object. If itching is a problem, use a blow dryer on a cool setting over the itchy area.  Do not trim or cut your cast.  Do not take out the padding from inside your cast.  Exercise your joints near the cast as told by your doctor.  Raise (elevate) your injured limb on 1 or 2 pillows for the first 1 to 3 days. GET HELP IF:  Your cast or splint cracks.  Your cast or splint is too tight or too loose.  You itch badly under the cast.  Your cast gets wet or has a soft spot.  You have a bad smell coming from the cast.  You get an object stuck under the cast.  Your skin around the cast becomes red or sore.  You have new or more pain after the cast is put on. GET HELP RIGHT AWAY IF:  You have fluid leaking through the cast.  You cannot move your fingers or toes.  Your fingers or toes turn blue or white or are cool, painful, or puffy (swollen).  You have tingling or lose feeling (numbness) around the injured area.  You have bad pain or pressure under the  cast.  You have trouble breathing or have shortness of breath.  You have chest pain. Document Released: 06/23/2010 Document Revised: 10/24/2012 Document Reviewed: 08/30/2012 [facility] Patient Information 2014 [place].  Assault, General Assault includes any behavior, whether intentional or reckless, which results in bodily injury to another person and/or damage to property. Included in this would be any behavior, intentional or reckless, that by its nature would be understood (interpreted) by a reasonable person as intent to harm another person or to damage his/her property. Threats may be oral or written. They may be communicated through regular mail, computer, fax, or phone. These threats may be direct or implied. FORMS OF ASSAULT INCLUDE:  Physically assaulting a person. This includes physical threats to inflict physical harm as well as:  Slapping.  Hitting.  Poking.  Kicking.  Punching.  Pushing.  Arson.  Sabotage.  Equipment vandalism.  Damaging or destroying property.  Throwing or hitting objects.  Displaying a weapon or an object that appears to be a weapon in a threatening manner.  Carrying a firearm of any kind.  Using a weapon to harm someone.  Using greater physical size/strength to intimidate another.  Making intimidating  or threatening gestures.  Bullying.  Hazing.  Intimidating, threatening, hostile, or abusive language directed toward another person.  It communicates the intention to engage in violence against that person. And it leads a reasonable person to expect that violent behavior may occur.  Stalking another person. IF IT HAPPENS AGAIN:  Immediately call for emergency help (911 in [place]  If someone poses clear and immediate danger to you, seek legal authorities to have a protective or restraining order put in place.  Less threatening assaults can at least be reported to authorities. STEPS TO TAKE IF A SEXUAL ASSAULT HAS  HAPPENED  Go to an area of safety. This may include a shelter or staying with a friend. Stay away from the area where you have been attacked. A large percentage of sexual assaults are caused by a friend, relative or associate.  If medications were given by your caregiver, take them as directed for the full length of time prescribed.  Only take over-the-counter or prescription medicines for pain, discomfort, or fever as directed by your caregiver.  If you have come in contact with a sexual disease, find out if you are to be tested again. If your caregiver is concerned about the HIV/AIDS virus, he/she may require you to have continued testing for several months.  For the protection of your privacy, test results can not be given over the phone. Make sure you receive the results of your test. If your test results are not back during your visit, make an appointment with your caregiver to find out the results. Do not assume everything is normal if you have not heard from your caregiver or the medical facility. It is important for you to follow up on all of your test results.  File appropriate papers with authorities. This is important in all assaults, even if it has occurred in a family or by a friend. SEEK MEDICAL CARE IF:  You have new problems because of your injuries.  You have problems that may be because of the medicine you are taking, such as:  Rash.  Itching.  Swelling.  Trouble breathing.  You develop belly (abdominal) pain, feel sick to your stomach (nausea) or are vomiting.  You begin to run a temperature.  You need supportive care or referral to a rape crisis center. These are centers with trained personnel who can help you get through this ordeal. SEEK IMMEDIATE MEDICAL CARE IF:  You are afraid of being threatened, beaten, or abused. In [place] call 911.  You receive new injuries related to abuse.  You develop severe pain in any area injured in the assault or have any  change in your condition that concerns you.  You faint or lose consciousness.  You develop chest pain or shortness of breath. Document Released: 02/21/2005 Document Revised: 05/16/2011 Document Reviewed: 10/10/2007 [facility] Patient Information 2014 [place].

## 2013-04-01 ENCOUNTER — Ambulatory Visit (INDEPENDENT_AMBULATORY_CARE_PROVIDER_SITE_OTHER): Payer: Self-pay | Admitting: Infectious Disease

## 2013-04-01 ENCOUNTER — Encounter: Payer: Self-pay | Admitting: Infectious Disease

## 2013-04-01 ENCOUNTER — Ambulatory Visit: Payer: Self-pay | Admitting: Infectious Disease

## 2013-04-01 VITALS — BP 127/77 | HR 92 | Temp 98.8°F | Wt 127.0 lb

## 2013-04-01 DIAGNOSIS — W3400XA Accidental discharge from unspecified firearms or gun, initial encounter: Secondary | ICD-10-CM

## 2013-04-01 DIAGNOSIS — S7290XA Unspecified fracture of unspecified femur, initial encounter for closed fracture: Secondary | ICD-10-CM

## 2013-04-01 DIAGNOSIS — S7291XA Unspecified fracture of right femur, initial encounter for closed fracture: Secondary | ICD-10-CM

## 2013-04-01 DIAGNOSIS — Z113 Encounter for screening for infections with a predominantly sexual mode of transmission: Secondary | ICD-10-CM

## 2013-04-01 DIAGNOSIS — B20 Human immunodeficiency virus [HIV] disease: Secondary | ICD-10-CM

## 2013-04-01 DIAGNOSIS — T148XXA Other injury of unspecified body region, initial encounter: Secondary | ICD-10-CM

## 2013-04-01 DIAGNOSIS — F17200 Nicotine dependence, unspecified, uncomplicated: Secondary | ICD-10-CM

## 2013-04-01 HISTORY — DX: Accidental discharge from unspecified firearms or gun, initial encounter: W34.00XA

## 2013-04-01 HISTORY — DX: Unspecified fracture of right femur, initial encounter for closed fracture: S72.91XA

## 2013-04-01 LAB — CBC WITH DIFFERENTIAL/PLATELET
Basophils Absolute: 0.1 10*3/uL (ref 0.0–0.1)
Basophils Relative: 1 % (ref 0–1)
EOS ABS: 0.1 10*3/uL (ref 0.0–0.7)
EOS PCT: 2 % (ref 0–5)
HCT: 35.4 % — ABNORMAL LOW (ref 39.0–52.0)
HEMOGLOBIN: 12 g/dL — AB (ref 13.0–17.0)
LYMPHS ABS: 2.7 10*3/uL (ref 0.7–4.0)
Lymphocytes Relative: 38 % (ref 12–46)
MCH: 31.3 pg (ref 26.0–34.0)
MCHC: 33.9 g/dL (ref 30.0–36.0)
MCV: 92.2 fL (ref 78.0–100.0)
MONOS PCT: 5 % (ref 3–12)
Monocytes Absolute: 0.4 10*3/uL (ref 0.1–1.0)
Neutro Abs: 3.8 10*3/uL (ref 1.7–7.7)
Neutrophils Relative %: 54 % (ref 43–77)
PLATELETS: 255 10*3/uL (ref 150–400)
RBC: 3.84 MIL/uL — ABNORMAL LOW (ref 4.22–5.81)
RDW: 14 % (ref 11.5–15.5)
WBC: 7.1 10*3/uL (ref 4.0–10.5)

## 2013-04-01 LAB — COMPLETE METABOLIC PANEL WITH GFR
ALT: 13 U/L (ref 0–53)
AST: 16 U/L (ref 0–37)
Albumin: 4.1 g/dL (ref 3.5–5.2)
Alkaline Phosphatase: 62 U/L (ref 39–117)
BUN: 13 mg/dL (ref 6–23)
CO2: 30 meq/L (ref 19–32)
CREATININE: 0.72 mg/dL (ref 0.50–1.35)
Calcium: 8.8 mg/dL (ref 8.4–10.5)
Chloride: 106 mEq/L (ref 96–112)
Glucose, Bld: 105 mg/dL — ABNORMAL HIGH (ref 70–99)
Potassium: 4 mEq/L (ref 3.5–5.3)
Sodium: 142 mEq/L (ref 135–145)
Total Bilirubin: 0.3 mg/dL (ref 0.3–1.2)
Total Protein: 6.4 g/dL (ref 6.0–8.3)

## 2013-04-01 LAB — RPR

## 2013-04-01 MED ORDER — EFAVIRENZ-EMTRICITAB-TENOFOVIR 600-200-300 MG PO TABS: 1.0000 | ORAL_TABLET | Freq: Every day | ORAL | Status: DC

## 2013-04-01 MED ORDER — TRAMADOL HCL 50 MG PO TABS: 50.0000 mg | ORAL_TABLET | Freq: Four times a day (QID) | ORAL | Status: DC | PRN

## 2013-04-01 NOTE — Addendum Note (Signed)
Addended by: Dolan Amen D on: 04/01/2013 02:38 PM   Modules accepted: Orders
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
## 2013-04-02 LAB — T-HELPER CELL (CD4) - (RCID CLINIC ONLY)
CD4 % Helper T Cell: 43 % (ref 33–55)
CD4 T Cell Abs: 1090 /uL (ref 400–2700)

## 2013-04-02 LAB — HEPATITIS C ANTIBODY: HCV Ab: NEGATIVE

## 2013-04-03 LAB — HIV-1 RNA QUANT-NO REFLEX-BLD
HIV 1 RNA Quant: <20 copies/mL (ref <20)
HIV-1 RNA Quant, Log: <1.3 {Log} (ref <1.30)

## 2013-04-17 ENCOUNTER — Inpatient Hospital Stay: Payer: Self-pay

## 2013-04-24 ENCOUNTER — Ambulatory Visit: Payer: Self-pay | Admitting: Infectious Disease

## 2013-04-24 ENCOUNTER — Ambulatory Visit (INDEPENDENT_AMBULATORY_CARE_PROVIDER_SITE_OTHER): Payer: Self-pay | Admitting: Infectious Disease

## 2013-04-24 ENCOUNTER — Ambulatory Visit: Payer: Self-pay

## 2013-04-24 ENCOUNTER — Encounter: Payer: Self-pay | Admitting: Infectious Disease

## 2013-04-24 ENCOUNTER — Telehealth: Payer: Self-pay | Admitting: *Deleted

## 2013-04-24 VITALS — BP 101/68 | HR 96 | Temp 98.2°F | Wt 133.0 lb

## 2013-04-24 DIAGNOSIS — B20 Human immunodeficiency virus [HIV] disease: Secondary | ICD-10-CM

## 2013-04-24 DIAGNOSIS — T148XXA Other injury of unspecified body region, initial encounter: Secondary | ICD-10-CM

## 2013-04-24 DIAGNOSIS — W3400XA Accidental discharge from unspecified firearms or gun, initial encounter: Secondary | ICD-10-CM

## 2013-04-24 DIAGNOSIS — F17200 Nicotine dependence, unspecified, uncomplicated: Secondary | ICD-10-CM

## 2013-04-24 MED ORDER — EFAVIRENZ-EMTRICITAB-TENOFOVIR 600-200-300 MG PO TABS: 1.0000 | ORAL_TABLET | Freq: Every day | ORAL | Status: DC

## 2013-04-24 NOTE — Telephone Encounter (Signed)
ADAP application 
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
## 2013-05-08 ENCOUNTER — Ambulatory Visit: Payer: Self-pay | Admitting: Internal Medicine

## 2013-11-27 ENCOUNTER — Other Ambulatory Visit: Payer: Self-pay | Admitting: *Deleted

## 2013-11-27 DIAGNOSIS — B20 Human immunodeficiency virus [HIV] disease: Secondary | ICD-10-CM

## 2013-11-27 MED ORDER — EFAVIRENZ-EMTRICITAB-TENOFOVIR 600-200-300 MG PO TABS: 1.0000 | ORAL_TABLET | Freq: Every day | ORAL | Status: DC

## 2014-01-06 ENCOUNTER — Other Ambulatory Visit: Payer: Self-pay

## 2014-01-20 ENCOUNTER — Ambulatory Visit: Payer: Self-pay | Admitting: Infectious Disease

## 2014-01-29 ENCOUNTER — Other Ambulatory Visit: Payer: Self-pay | Admitting: Infectious Disease

## 2014-01-29 DIAGNOSIS — B20 Human immunodeficiency virus [HIV] disease: Secondary | ICD-10-CM

## 2014-02-27 ENCOUNTER — Other Ambulatory Visit: Payer: Self-pay | Admitting: Infectious Disease

## 2014-02-27 DIAGNOSIS — B20 Human immunodeficiency virus [HIV] disease: Secondary | ICD-10-CM

## 2014-03-28 ENCOUNTER — Other Ambulatory Visit: Payer: Self-pay | Admitting: Infectious Disease

## 2014-04-28 ENCOUNTER — Other Ambulatory Visit: Payer: Self-pay | Admitting: Infectious Disease

## 2014-05-05 ENCOUNTER — Ambulatory Visit: Payer: Self-pay

## 2014-05-05 ENCOUNTER — Other Ambulatory Visit: Payer: Self-pay | Admitting: *Deleted

## 2014-05-05 ENCOUNTER — Other Ambulatory Visit (INDEPENDENT_AMBULATORY_CARE_PROVIDER_SITE_OTHER): Payer: Self-pay

## 2014-05-05 DIAGNOSIS — Z113 Encounter for screening for infections with a predominantly sexual mode of transmission: Secondary | ICD-10-CM

## 2014-05-05 DIAGNOSIS — B20 Human immunodeficiency virus [HIV] disease: Secondary | ICD-10-CM

## 2014-05-05 DIAGNOSIS — Z79899 Other long term (current) drug therapy: Secondary | ICD-10-CM

## 2014-05-05 LAB — CBC WITH DIFFERENTIAL/PLATELET
Basophils Absolute: 0 10*3/uL (ref 0.0–0.1)
Basophils Relative: 0 % (ref 0–1)
Eosinophils Absolute: 0.1 10*3/uL (ref 0.0–0.7)
Eosinophils Relative: 1 % (ref 0–5)
HEMATOCRIT: 43.8 % (ref 39.0–52.0)
Hemoglobin: 14.6 g/dL (ref 13.0–17.0)
LYMPHS ABS: 2.6 10*3/uL (ref 0.7–4.0)
Lymphocytes Relative: 22 % (ref 12–46)
MCH: 31.6 pg (ref 26.0–34.0)
MCHC: 33.3 g/dL (ref 30.0–36.0)
MCV: 94.8 fL (ref 78.0–100.0)
MONOS PCT: 4 % (ref 3–12)
MPV: 8.7 fL (ref 8.6–12.4)
Monocytes Absolute: 0.5 10*3/uL (ref 0.1–1.0)
NEUTROS ABS: 8.6 10*3/uL — AB (ref 1.7–7.7)
NEUTROS PCT: 73 % (ref 43–77)
Platelets: 270 10*3/uL (ref 150–400)
RBC: 4.62 MIL/uL (ref 4.22–5.81)
RDW: 14.1 % (ref 11.5–15.5)
WBC: 11.8 10*3/uL — AB (ref 4.0–10.5)

## 2014-05-05 LAB — LIPID PANEL
Cholesterol: 115 mg/dL (ref 0–200)
HDL: 62 mg/dL (ref >=40)
LDL CALC: 33 mg/dL (ref 0–99)
Total CHOL/HDL Ratio: 1.9 Ratio
Triglycerides: 99 mg/dL (ref <150)
VLDL: 20 mg/dL (ref 0–40)

## 2014-05-05 LAB — COMPLETE METABOLIC PANEL WITH GFR
ALT: 15 U/L (ref 0–53)
AST: 18 U/L (ref 0–37)
Albumin: 4.6 g/dL (ref 3.5–5.2)
Alkaline Phosphatase: 74 U/L (ref 39–117)
BILIRUBIN TOTAL: 0.3 mg/dL (ref 0.2–1.2)
BUN: 13 mg/dL (ref 6–23)
CHLORIDE: 105 meq/L (ref 96–112)
CO2: 27 mEq/L (ref 19–32)
Calcium: 9.4 mg/dL (ref 8.4–10.5)
Creat: 0.9 mg/dL (ref 0.50–1.35)
GFR, Est Non African American: >89 mL/min
Glucose, Bld: 87 mg/dL (ref 70–99)
Potassium: 4.2 mEq/L (ref 3.5–5.3)
SODIUM: 144 meq/L (ref 135–145)
Total Protein: 6.9 g/dL (ref 6.0–8.3)

## 2014-05-05 LAB — RPR

## 2014-05-05 MED ORDER — EFAVIRENZ-EMTRICITAB-TENOFOVIR 600-200-300 MG PO TABS: ORAL_TABLET | ORAL | Status: DC

## 2014-05-05 NOTE — Telephone Encounter (Signed)
ADAP Application 

## 2014-05-06 LAB — MICROALBUMIN / CREATININE URINE RATIO
Creatinine, Urine: 230.7 mg/dL
MICROALB UR: 1.8 mg/dL (ref <2.0)
Microalb Creat Ratio: 7.8 mg/g (ref 0.0–30.0)

## 2014-05-06 LAB — T-HELPER CELL (CD4) - (RCID CLINIC ONLY)
CD4 % Helper T Cell: 40 % (ref 33–55)
CD4 T CELL ABS: 1080 /uL (ref 400–2700)

## 2014-05-06 LAB — HIV-1 RNA QUANT-NO REFLEX-BLD
HIV 1 RNA Quant: <20 copies/mL (ref <20)
HIV-1 RNA Quant, Log: <1.3 {Log} (ref <1.30)

## 2014-05-06 LAB — URINE CYTOLOGY ANCILLARY ONLY
Chlamydia: NEGATIVE
Neisseria Gonorrhea: NEGATIVE

## 2014-05-27 ENCOUNTER — Ambulatory Visit: Payer: Self-pay | Admitting: Infectious Disease

## 2014-05-28 ENCOUNTER — Ambulatory Visit: Payer: Self-pay | Admitting: Infectious Disease

## 2014-06-03 ENCOUNTER — Ambulatory Visit: Payer: Self-pay | Admitting: Infectious Disease

## 2014-06-04 ENCOUNTER — Other Ambulatory Visit: Payer: Self-pay | Admitting: Infectious Diseases

## 2014-06-04 DIAGNOSIS — B20 Human immunodeficiency virus [HIV] disease: Secondary | ICD-10-CM

## 2014-06-23 ENCOUNTER — Ambulatory Visit (INDEPENDENT_AMBULATORY_CARE_PROVIDER_SITE_OTHER): Payer: Self-pay | Admitting: Infectious Disease

## 2014-06-23 ENCOUNTER — Encounter: Payer: Self-pay | Admitting: Infectious Disease

## 2014-06-23 VITALS — BP 113/78 | HR 90 | Temp 98.1°F | Wt 127.0 lb

## 2014-06-23 DIAGNOSIS — W3400XA Accidental discharge from unspecified firearms or gun, initial encounter: Secondary | ICD-10-CM

## 2014-06-23 DIAGNOSIS — S7291XF Unspecified fracture of right femur, subsequent encounter for open fracture type IIIA, IIIB, or IIIC with routine healing: Secondary | ICD-10-CM

## 2014-06-23 DIAGNOSIS — Z720 Tobacco use: Secondary | ICD-10-CM

## 2014-06-23 DIAGNOSIS — B20 Human immunodeficiency virus [HIV] disease: Secondary | ICD-10-CM

## 2014-06-23 DIAGNOSIS — F17200 Nicotine dependence, unspecified, uncomplicated: Secondary | ICD-10-CM

## 2014-06-23 DIAGNOSIS — T148 Other injury of unspecified body region: Secondary | ICD-10-CM

## 2014-06-23 MED ORDER — EMTRICITAB-RILPIVIR-TENOFOV AF 200-25-25 MG PO TABS: 1.0000 | ORAL_TABLET | Freq: Every day | ORAL | Status: DC

## 2014-06-23 NOTE — Patient Instructions (Signed)
Your new regimen   Is   ODEFSEY one pill ONCE A DAY with a 400 calorie meal  NO Proton Pump Inhibitors and let me know if you need antacids
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
## 2014-08-11 ENCOUNTER — Other Ambulatory Visit: Payer: Self-pay

## 2014-08-25 ENCOUNTER — Ambulatory Visit: Payer: Self-pay | Admitting: Infectious Disease

## 2014-09-10 ENCOUNTER — Encounter (HOSPITAL_COMMUNITY): Payer: Self-pay | Admitting: Emergency Medicine

## 2014-09-10 ENCOUNTER — Emergency Department (HOSPITAL_COMMUNITY): Payer: Self-pay

## 2014-09-10 ENCOUNTER — Emergency Department (HOSPITAL_COMMUNITY)
Admission: EM | Admit: 2014-09-10 | Discharge: 2014-09-10 | Disposition: A | Discharge status: Home / Self Care | Payer: Self-pay | Attending: Emergency Medicine | Admitting: Emergency Medicine

## 2014-09-10 DIAGNOSIS — Z720 Tobacco use: Secondary | ICD-10-CM | POA: Insufficient documentation

## 2014-09-10 DIAGNOSIS — Z8614 Personal history of Methicillin resistant Staphylococcus aureus infection: Secondary | ICD-10-CM | POA: Insufficient documentation

## 2014-09-10 DIAGNOSIS — Z21 Asymptomatic human immunodeficiency virus [HIV] infection status: Secondary | ICD-10-CM | POA: Insufficient documentation

## 2014-09-10 DIAGNOSIS — K859 Acute pancreatitis, unspecified: Secondary | ICD-10-CM | POA: Insufficient documentation

## 2014-09-10 DIAGNOSIS — Z79899 Other long term (current) drug therapy: Secondary | ICD-10-CM | POA: Insufficient documentation

## 2014-09-10 DIAGNOSIS — R1013 Epigastric pain: Secondary | ICD-10-CM

## 2014-09-10 LAB — CBC WITH DIFFERENTIAL/PLATELET
Basophils Absolute: 0 10*3/uL (ref 0.0–0.1)
Basophils Relative: 0 % (ref 0–1)
Eosinophils Absolute: 0 10*3/uL (ref 0.0–0.7)
Eosinophils Relative: 0 % (ref 0–5)
HEMATOCRIT: 42.1 % (ref 39.0–52.0)
Hemoglobin: 14.8 g/dL (ref 13.0–17.0)
Lymphocytes Relative: 25 % (ref 12–46)
Lymphs Abs: 2.4 10*3/uL (ref 0.7–4.0)
MCH: 31.9 pg (ref 26.0–34.0)
MCHC: 35.2 g/dL (ref 30.0–36.0)
MCV: 90.7 fL (ref 78.0–100.0)
Monocytes Absolute: 0.5 10*3/uL (ref 0.1–1.0)
Monocytes Relative: 6 % (ref 3–12)
NEUTROS ABS: 6.7 10*3/uL (ref 1.7–7.7)
NEUTROS PCT: 69 % (ref 43–77)
Platelets: 221 10*3/uL (ref 150–400)
RBC: 4.64 MIL/uL (ref 4.22–5.81)
RDW: 12.9 % (ref 11.5–15.5)
WBC: 9.6 10*3/uL (ref 4.0–10.5)

## 2014-09-10 LAB — COMPREHENSIVE METABOLIC PANEL
ALBUMIN: 3.9 g/dL (ref 3.5–5.0)
ALT: 21 U/L (ref 17–63)
AST: 21 U/L (ref 15–41)
Alkaline Phosphatase: 58 U/L (ref 38–126)
Anion gap: 12 (ref 5–15)
BUN: 13 mg/dL (ref 6–20)
CALCIUM: 8.9 mg/dL (ref 8.9–10.3)
CO2: 27 mmol/L (ref 22–32)
CREATININE: 0.99 mg/dL (ref 0.61–1.24)
Chloride: 99 mmol/L — ABNORMAL LOW (ref 101–111)
GFR calc Af Amer: >60 mL/min (ref >60)
Glucose, Bld: 139 mg/dL — ABNORMAL HIGH (ref 65–99)
Potassium: 3.6 mmol/L (ref 3.5–5.1)
Sodium: 138 mmol/L (ref 135–145)
Total Bilirubin: 1 mg/dL (ref 0.3–1.2)
Total Protein: 6.8 g/dL (ref 6.5–8.1)

## 2014-09-10 LAB — LIPASE, BLOOD: LIPASE: 72 U/L — AB (ref 22–51)

## 2014-09-10 MED ORDER — MORPHINE SULFATE 4 MG/ML IJ SOLN
4.0000 mg | Freq: Once | INTRAMUSCULAR | Status: AC
  Administered 2014-09-10: 4 mg via INTRAVENOUS
  Filled 2014-09-10: qty 1

## 2014-09-10 MED ORDER — SODIUM CHLORIDE 0.9 % IV BOLUS (SEPSIS)
1000.0000 mL | Freq: Once | INTRAVENOUS | Status: AC
  Administered 2014-09-10: 1000 mL via INTRAVENOUS

## 2014-09-10 MED ORDER — HYDROCODONE-ACETAMINOPHEN 5-325 MG PO TABS: 1.0000 | ORAL_TABLET | Freq: Four times a day (QID) | ORAL | Status: DC | PRN

## 2014-09-10 MED ORDER — ONDANSETRON HCL 4 MG/2ML IJ SOLN
4.0000 mg | Freq: Once | INTRAMUSCULAR | Status: AC
  Administered 2014-09-10: 4 mg via INTRAVENOUS
  Filled 2014-09-10: qty 2

## 2014-09-10 NOTE — ED Notes (Signed)
Dr. Stark Jock in to assess patient.
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
## 2014-09-10 NOTE — Discharge Instructions (Signed)
Adhere to a clear liquid diet for the next 2 days, then slowly advance to normal.  Significantly cut down your alcohol intake.  Hydrocodone as prescribed as needed for pain.  Omeprazole 20 mg twice daily for the next 2 weeks   Acute Pancreatitis Acute pancreatitis is a disease in which the pancreas becomes suddenly inflamed. The pancreas is a large gland located behind your stomach. The pancreas produces enzymes that help digest food. The pancreas also releases the hormones glucagon and insulin that help regulate blood sugar. Damage to the pancreas occurs when the digestive enzymes from the pancreas are activated and begin attacking the pancreas before being released into the intestine. Most acute attacks last a couple of days and can cause serious complications. Some people become dehydrated and develop low blood pressure. In severe cases, bleeding into the pancreas can lead to shock and can be life-threatening. The lungs, heart, and kidneys may fail. CAUSES  Pancreatitis can happen to anyone. In some cases, the cause is unknown. Most cases are caused by:  Alcohol abuse.  Gallstones. Other less common causes are:  Certain medicines.  Exposure to certain chemicals.  Infection.  Damage caused by an accident (trauma).  Abdominal surgery. SYMPTOMS   Pain in the upper abdomen that may radiate to the back.  Tenderness and swelling of the abdomen.  Nausea and vomiting. DIAGNOSIS  Your caregiver will perform a physical exam. Blood and stool tests may be done to confirm the diagnosis. Imaging tests may also be done, such as X-rays, CT scans, or an ultrasound of the abdomen. TREATMENT  Treatment usually requires a stay in the hospital. Treatment may include:  Pain medicine.  Fluid replacement through an intravenous line (IV).  Placing a tube in the stomach to remove stomach contents and control vomiting.  Not eating for 3 or 4 days. This gives your pancreas a rest, because  enzymes are not being produced that can cause further damage.  Antibiotic medicines if your condition is caused by an infection.  Surgery of the pancreas or gallbladder. HOME CARE INSTRUCTIONS   Follow the diet advised by your caregiver. This may involve avoiding alcohol and decreasing the amount of fat in your diet.  Eat smaller, more frequent meals. This reduces the amount of digestive juices the pancreas produces.  Drink enough fluids to keep your urine clear or pale yellow.  Only take over-the-counter or prescription medicines as directed by your caregiver.  Avoid drinking alcohol if it caused your condition.  Do not smoke.  Get plenty of rest.  Check your blood sugar at home as directed by your caregiver.  Keep all follow-up appointments as directed by your caregiver. SEEK MEDICAL CARE IF:   You do not recover as quickly as expected.  You develop new or worsening symptoms.  You have persistent pain, weakness, or nausea.  You recover and then have another episode of pain. SEEK IMMEDIATE MEDICAL CARE IF:   You are unable to eat or keep fluids down.  Your pain becomes severe.  You have a fever or persistent symptoms for more than 2 to 3 days.  You have a fever and your symptoms suddenly get worse.  Your skin or the white part of your eyes turn yellow (jaundice).  You develop vomiting.  You feel dizzy, or you faint.  Your blood sugar is high (over 300 mg/dL). MAKE SURE YOU:   Understand these instructions.  Will watch your condition.  Will get help right away if you are not  doing well or get worse. Document Released: 02/21/2005 Document Revised: 08/23/2011 Document Reviewed: 06/02/2011 [facility] Patient Information 2015 [place], [place]. This information is not intended to replace advice given to you by your health care provider. Make sure you discuss any questions you have with your health care provider.

## 2014-09-10 NOTE — ED Notes (Signed)
Patient c/o epigastric pain with vomiting since Sunday; patient states when he vomits, it is bile.

## 2014-10-14 ENCOUNTER — Telehealth: Payer: Self-pay | Admitting: *Deleted

## 2014-10-14 NOTE — Telephone Encounter (Signed)
Call for appt for RW/ADAP renewal.

## 2014-10-20 ENCOUNTER — Ambulatory Visit: Payer: Self-pay

## 2015-07-08 ENCOUNTER — Encounter (HOSPITAL_COMMUNITY): Payer: Self-pay | Admitting: Emergency Medicine

## 2015-07-08 ENCOUNTER — Emergency Department (HOSPITAL_COMMUNITY)
Admission: EM | Admit: 2015-07-08 | Discharge: 2015-07-08 | Disposition: A | Discharge status: Home / Self Care | Payer: BLUE CROSS/BLUE SHIELD | Attending: Emergency Medicine | Admitting: Emergency Medicine

## 2015-07-08 DIAGNOSIS — K4090 Unilateral inguinal hernia, without obstruction or gangrene, not specified as recurrent: Secondary | ICD-10-CM | POA: Insufficient documentation

## 2015-07-08 DIAGNOSIS — L720 Epidermal cyst: Secondary | ICD-10-CM | POA: Insufficient documentation

## 2015-07-08 DIAGNOSIS — D2330 Other benign neoplasm of skin of unspecified part of face: Secondary | ICD-10-CM

## 2015-07-08 DIAGNOSIS — Z79899 Other long term (current) drug therapy: Secondary | ICD-10-CM | POA: Diagnosis not present

## 2015-07-08 DIAGNOSIS — R1032 Left lower quadrant pain: Secondary | ICD-10-CM | POA: Diagnosis present

## 2015-07-08 DIAGNOSIS — F17210 Nicotine dependence, cigarettes, uncomplicated: Secondary | ICD-10-CM | POA: Insufficient documentation

## 2015-07-08 NOTE — Discharge Instructions (Signed)
Hernia, Adult A hernia is the bulging of an organ or tissue through a weak spot in the muscles of the abdomen (abdominal wall). Hernias develop most often near the navel or groin. There are many kinds of hernias. Common kinds include:  Femoral hernia. This kind of hernia develops under the groin in the upper thigh area.  Inguinal hernia. This kind of hernia develops in the groin or scrotum.  Umbilical hernia. This kind of hernia develops near the navel.  Hiatal hernia. This kind of hernia causes part of the stomach to be pushed up into the chest.  Incisional hernia. This kind of hernia bulges through a scar from an abdominal surgery. CAUSES This condition may be caused by:  Heavy lifting.  Coughing over a long period of time.  Straining to have a bowel movement.  An incision made during an abdominal surgery.  A birth defect (congenital defect).  Excess weight or obesity.  Smoking.  Poor nutrition.  Cystic fibrosis.  Excess fluid in the abdomen.  Undescended testicles. SYMPTOMS Symptoms of a hernia include:  A lump on the abdomen. This is the first sign of a hernia. The lump may become more obvious with standing, straining, or coughing. It may get bigger over time if it is not treated or if the condition causing it is not treated.  Pain. A hernia is usually painless, but it may become painful over time if treatment is delayed. The pain is usually dull and may get worse with standing or lifting heavy objects. Sometimes a hernia gets tightly squeezed in the weak spot (strangulated) or stuck there (incarcerated) and causes additional symptoms. These symptoms may include:  Vomiting.  Nausea.  Constipation.  Irritability. DIAGNOSIS A hernia may be diagnosed with:  A physical exam. During the exam your health care provider may ask you to cough or to make a specific movement, because a hernia is usually more visible when you move.  Imaging tests. These can  include:  X-rays.  Ultrasound.  CT scan. TREATMENT A hernia that is small and painless may not need to be treated. A hernia that is large or painful may be treated with surgery. Inguinal hernias may be treated with surgery to prevent incarceration or strangulation. Strangulated hernias are always treated with surgery, because lack of blood to the trapped organ or tissue can cause it to die. Surgery to treat a hernia involves pushing the bulge back into place and repairing the weak part of the abdomen. HOME CARE INSTRUCTIONS  Avoid straining.  Do not lift anything heavier than 10 lb (4.5 kg).  Lift with your leg muscles, not your back muscles. This helps avoid strain.  When coughing, try to cough gently.  Prevent constipation. Constipation leads to straining with bowel movements, which can make a hernia worse or cause a hernia repair to break down. You can prevent constipation by:  Eating a high-fiber diet that includes plenty of fruits and vegetables.  Drinking enough fluids to keep your urine clear or pale yellow. Aim to drink 6-8 glasses of water per day.  Using a stool softener as directed by your health care provider.  Lose weight, if you are overweight.  Do not use any tobacco products, including cigarettes, chewing tobacco, or electronic cigarettes. If you need help quitting, ask your health care provider.  Keep all follow-up visits as directed by your health care provider. This is important. Your health care provider may need to monitor your condition. SEEK MEDICAL CARE IF:  You have   swelling, redness, and pain in the affected area.  Your bowel habits change. SEEK IMMEDIATE MEDICAL CARE IF:  You have a fever.  You have abdominal pain that is getting worse.  You feel nauseous or you vomit.  You cannot push the hernia back in place by gently pressing on it while you are lying down.  The hernia:  Changes in shape or size.  Is stuck outside the  abdomen.  Becomes discolored.  Feels hard or tender.   This information is not intended to replace advice given to you by your health care provider. Make sure you discuss any questions you have with your health care provider.   Document Released: 02/21/2005 Document Revised: 03/14/2014 Document Reviewed: 01/01/2014 Elsevier Interactive Patient Education 2016 [employer]  

## 2015-07-08 NOTE — ED Notes (Signed)
Report there is no pain to the groin or the cyst on his head. Reports he just wants the hernia and cyst removed. States he has a PCP that referred him to [facility], but he could not go because he did not have insurance. States he has insurance now and called them back and they requested his records to see him. He requested records from PCP last week and called today and [facility] had not received them yet. States he was tired of waiting, so came here to see if the cyst and hernia could be removed any faster.

## 2015-07-08 NOTE — ED Provider Notes (Signed)
CSN: [id number]     Arrival date & time 07/08/15  1135 History   First MD Initiated Contact with Patient 07/08/15 1521     Chief Complaint  Patient presents with  . Hernia  . Cyst     (Consider location/radiation/quality/duration/timing/severity/associated sxs/prior Treatment) Patient is a 55 y.o. male presenting with abdominal pain. The history is provided by the patient. No language interpreter was used.  Abdominal Pain Pain location:  LLQ Pain quality: pressure   Pain radiates to:  Does not radiate Pain severity:  Mild Duration: years. Timing:  Constant Progression:  Unchanged Chronicity:  Chronic Context: not sick contacts   Relieved by:  Nothing Worsened by:  Nothing tried Ineffective treatments:  None tried Associated symptoms: no nausea   Pt complains of a hernia for the past 2 years.  Pt also has a cyst on his scalp.  Cyst has been there for over 2 years.  Pt has called [facility].  He states that he can not be seen until they review his medical records.  Pt here because he thinks he needs surgery now.   Past Medical History  Diagnosis Date  . Legal circumstances 12/01/2008    felony murder conviction on his record  . HIV (human immunodeficiency virus infection) ([place])   . Methicillin resistant Staphylococcus aureus infection    History reviewed. No pertinent past surgical history. Family History  Problem Relation Age of Onset  . Cancer Father    Social History  Substance Use Topics  . Smoking status: Current Every Day Smoker -- 0.50 packs/day    Types: Cigarettes  . Smokeless tobacco: None     Comment: 5 week  . Alcohol Use: 3.6 oz/week    6 Cans of beer per week     Comment: Daily    Review of Systems  Gastrointestinal: Positive for abdominal pain. Negative for nausea.  All other systems reviewed and are negative.     Allergies  Review of patient's allergies indicates no known allergies.  Home Medications   Prior to Admission  medications   Medication Sig Start Date End Date Taking? Authorizing Provider  Emtricitab-Rilpivir-Tenofov AF (ODEFSEY) 200-25-25 MG TABS Take 1 tablet by mouth daily with supper. 06/23/14  Yes Truman Hayward, MD  HYDROcodone-acetaminophen [facility]) 5-325 MG per tablet Take 1-2 tablets by mouth every 6 (six) hours as needed. 09/10/14   Veryl Speak, MD   BP 116/82 mmHg  Pulse 68  Temp(Src) 98 F (36.7 C) (Oral)  Resp 18  SpO2 100% Physical Exam  Constitutional: He is oriented to person, place, and time. He appears well-developed and well-nourished.  HENT:  Head: Normocephalic and atraumatic.  Right Ear: External ear normal.  Left Ear: External ear normal.  Nose: Nose normal.  Mouth/Throat: Oropharynx is clear and moist.  8cm area of swelling left forehead no erythema   Eyes: EOM are normal. Pupils are equal, round, and reactive to light.  Neck: Normal range of motion.  Cardiovascular: Normal rate and normal heart sounds.   Pulmonary/Chest: Effort normal.  Abdominal: Soft. He exhibits mass. He exhibits no distension.  Hernia left lower abdomen,  Reduces easily,     Musculoskeletal: Normal range of motion.  Neurological: He is alert and oriented to person, place, and time.  Psychiatric: He has a normal mood and affect.  Nursing note and vitals reviewed.   ED Course  Procedures (including critical care time) Labs Review Labs Reviewed - No data to display  Imaging Review No  results found. I have personally reviewed and evaluated these images and lab results as part of my medical decision-making.   EKG Interpretation None      MDM   Final diagnoses:  Left groin hernia  Cyst, dermoid, face    Pt advised he does not need acute surgery.   Pt advised he does need to follow up with [facility].   An After Visit Summary was printed and given to the patient.    Hollace Kinnier [place], PA-C 07/08/15 [place], MD 07/16/15 1034

## 2015-07-08 NOTE — ED Notes (Signed)
Pt sts hernia in left groin x 2 years and cyst to left side of head x 2 years; pt here to have checked today

## 2015-07-08 NOTE — ED Notes (Signed)
Sofia, PA at bedside.  

## 2015-07-20 ENCOUNTER — Ambulatory Visit: Payer: Self-pay | Admitting: Surgery

## 2015-07-20 NOTE — H&P (Signed)
Christopher Farrell 07/20/2015 9:27 AM Location: [facility] Patient #: [id number] DOB: 03-26-1960 Single / Language: [name] / Race: Black or African American Male  Patient Care Team: Christopher Hayward, MD as PCP - Infectious Diseases (Infectious Diseases) Christopher Pear, MD as Consulting Physician (Orthopedic Surgery) Christopher Boston, MD as Consulting Physician (General Surgery) Christopher Hayward, MD as Consulting Physician (Infectious Diseases)   History of Present Illness Christopher Hector MD; 07/20/2015 11:58 AM) The patient is a 55 year old male who presents for an evaluation of a hernia. Note for "Hernia": Patient sent by his infectious disease doctor, Dr. Roderic Farrell "Christopher Farrell" [facility], concern for symptomatic left inguinal hernia as well as growing lump on his scalp. Patient also notes he's been having some anorectal pain and wonders if he has hemorrhoids.  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 Struggled with getting in keeping insurance. I better financial place. Wished to be evaluated for concerns. He's had a lump on his left scalp for many years. He is notices, larger more recently. Occasionally bothersome with headaches. Wondered if it could be removed. Patient also noted a lump in the left groin concerning for hernia a couple of years ago. Used to be intermittent pain and bulging. Now it is out all the time. I discussed enlarger. Now going into the scrotum. Was concerned. Discuss with his infectious disease doctor. Recommendation made to consider at least hernia repair if not removal of the enlarging scalp mass. He does smoke half pack cigarettes a day. Occasional beer and marijuana. No other drugs. Never had any abdominal or anorectal surgery. I don't think he's had a colonoscopy yet. Can walk a half hour without difficulty.  Patient also notes for the past couple weeks he's had pain with bowel movements and occasional bleeding. Very sore to wife. Uncomfortable to sit.  Does not recall any history of hemorrhoids. No history of condyloma. History of fissure or fistula or pilonidal problems. Usually moves his bowels about twice a day. No personal nor family history of GI/colon cancer, inflammatory bowel disease, irritable bowel syndrome, allergy such as Celiac Sprue, dietary/dairy problems, colitis, ulcers nor gastritis. No recent sick contacts/gastroenteritis. No travel outside the country. No changes in diet. No dysphagia to solids or liquids. No significant heartburn or reflux. No hematochezia, hematemesis, coffee ground emesis. No evidence of prior gastric/peptic ulceration.   Other Problems Christopher Farrell, CMA; 07/20/2015 9:28 AM) Alcohol Abuse HIV-positive Pancreatitis  Diagnostic Studies History Christopher Farrell, CMA; 07/20/2015 9:28 AM) Colonoscopy never  Allergies Christopher Farrell, CMA; 07/20/2015 9:29 AM) No Known Drug Allergies 07/20/2015  Medication History (Christopher Farrell, CMA; 07/20/2015 9:29 AM) No Current Medications Medications Reconciled  Social History Christopher Farrell, CMA; 07/20/2015 9:28 AM) Alcohol use Occasional alcohol use. Illicit drug use Uses weekly. No caffeine use Tobacco use Former smoker.  Family History Christopher Farrell, Christopher Farrell; 07/20/2015 9:28 AM) Cancer Christopher Farrell.     Review of Systems Christopher Farrell CMA; 07/20/2015 9:28 AM) General Present- Weight Loss. Not Present- Appetite Loss, Chills, Fatigue, Fever, Night Sweats and Weight Gain. Skin Not Present- Change in Wart/Mole, Dryness, Hives, Jaundice, New Lesions, Non-Healing Wounds, Rash and Ulcer. HEENT Not Present- Earache, Hearing Loss, Hoarseness, Nose Bleed, Oral Ulcers, Ringing in the Ears, Seasonal Allergies, Sinus Pain, Sore Throat, Visual Disturbances, Wears glasses/contact lenses and Yellow Eyes. Respiratory Present- Difficulty Breathing. Not Present- Bloody sputum, Chronic Cough, Snoring and Wheezing. Breast Not Present- Breast Mass,  Breast Pain, Nipple Discharge and Skin Changes. Gastrointestinal Not Present- Abdominal Pain, Bloating, Bloody  Stool, Change in Bowel Habits, Chronic diarrhea, Constipation, Difficulty Swallowing, Excessive gas, Gets full quickly at meals, Hemorrhoids, Indigestion, Nausea, Rectal Pain and Vomiting. Male Genitourinary Not Present- Blood in Urine, Change in Urinary Stream, Frequency, Impotence, Nocturia, Painful Urination, Urgency and Urine Leakage.  Vitals (Christopher Farrell CMA; 07/20/2015 9:29 AM) 07/20/2015 9:28 AM Weight: 130 lb Height: 64in Body Surface Area: 1.63 m Body Mass Index: 22.31 kg/m  Pulse: 78 (Regular)  BP: 114/70 (Sitting, Left Arm, Standard)      Physical Exam Christopher Hector MD; 07/20/2015 9:52 AM)  General Mental Status-Alert. General Appearance-Not in acute distress, Not Sickly. Orientation-Oriented X3. Hydration-Well hydrated. Voice-Normal.  Integumentary Global Assessment Upon inspection and palpation of skin surfaces of the - Axillae: non-tender, no inflammation or ulceration, no drainage. and Distribution of scalp and body hair is normal. General Characteristics Temperature - normal warmth is noted.  Head and Neck Head-normocephalic, atraumatic with no lesions or palpable masses. Face Global Assessment - atraumatic, no absence of expression. Neck Global Assessment - no abnormal movements, no bruit auscultated on the right, no bruit auscultated on the left, no decreased range of motion, non-tender. Trachea-midline. Thyroid Gland Characteristics - non-tender. Note: Soft subcutaneous mass in the left frontal temporal region. 4 x 3 x 2 cm. Fixed to the scalp  Eye Eyeball - Left-Extraocular movements intact, No Nystagmus. Eyeball - Right-Extraocular movements intact, No Nystagmus. Cornea - Left-No Hazy. Cornea - Right-No Hazy. Sclera/Conjunctiva - Left-No scleral icterus, No Discharge. Sclera/Conjunctiva - Right-No  scleral icterus, No Discharge. Pupil - Left-Direct reaction to light normal. Pupil - Right-Direct reaction to light normal.  ENMT Ears Pinna - Left - no drainage observed, no generalized tenderness observed. Right - no drainage observed, no generalized tenderness observed. Nose and Sinuses External Inspection of the Nose - no destructive lesion observed. Inspection of the nares - Left - quiet respiration. Right - quiet respiration. Mouth and Throat Lips - Upper Lip - no fissures observed, no pallor noted. Lower Lip - no fissures observed, no pallor noted. Nasopharynx - no discharge present. Oral Cavity/Oropharynx - Tongue - no dryness observed. Oral Mucosa - no cyanosis observed. Hypopharynx - no evidence of airway distress observed.  Chest and Lung Exam Inspection Movements - Normal and Symmetrical. Accessory muscles - No use of accessory muscles in breathing. Palpation Palpation of the chest reveals - Non-tender. Auscultation Breath sounds - Normal and Clear.  Cardiovascular Auscultation Rhythm - Regular. Murmurs & Other Heart Sounds - Auscultation of the heart reveals - No Murmurs and No Systolic Clicks.  Abdomen Inspection Inspection of the abdomen reveals - No Visible peristalsis and No Abnormal pulsations. Umbilicus - No Bleeding, No Urine drainage. Palpation/Percussion Palpation and Percussion of the abdomen reveal - Soft, Non Tender, No Rebound tenderness, No Rigidity (guarding) and No Cutaneous hyperesthesia. Note: Soft and flat. Small umbilical hernia through the stalk. 5 mm  Male Genitourinary Sexual Maturity Tanner 5 - Adult hair pattern and Adult penile size and shape. Note: Large left inguinal hernia going down to scrotum reduces. No inguinal lymphadenopathy. No obvious hernia on the right side. Normal external genitalia. Epididymi, testes, and spermatic cords normal without any masses. No condyloma.  Rectal Note: Perianal skin clear. Hypopigmented elevated  very tender nodules with some beefiness associated with them. No purulence. Some more beefiness of the nodules at the anal verge. No fluctuance. No fissure. No fistula. Not consistent with hemorrhoids. Too tender be consistent with condyloma. Suspicious for herpes lesions. Examined with Dr. Marcello Moores, my colorectal partner. She  agrees.  Peripheral Vascular Upper Extremity Inspection - Left - No Cyanotic nailbeds, Not Ischemic. Right - No Cyanotic nailbeds, Not Ischemic.  Neurologic Neurologic evaluation reveals -normal attention span and ability to concentrate, able to name objects and repeat phrases. Appropriate fund of knowledge , normal sensation and normal coordination. Mental Status Affect - not angry, not paranoid. Cranial Nerves-Normal Bilaterally. Gait-Normal.  Neuropsychiatric Mental status exam performed with findings of-able to articulate well with normal speech/language, rate, volume and coherence, thought content normal with ability to perform basic computations and apply abstract reasoning and no evidence of hallucinations, delusions, obsessions or homicidal/suicidal ideation.  Musculoskeletal Global Assessment Spine, Ribs and Pelvis - no instability, subluxation or laxity. Right Upper Extremity - no instability, subluxation or laxity.  Lymphatic Head & Neck  General Head & Neck Lymphatics: Bilateral - Description - No Localized lymphadenopathy. Axillary  General Axillary Region: Bilateral - Description - No Localized lymphadenopathy. Femoral & Inguinal  Generalized Femoral & Inguinal Lymphatics: Left - Description - No Localized lymphadenopathy. Right - Description - No Localized lymphadenopathy.    Assessment & Plan Christopher Hector MD; 07/20/2015 11:59 AM)  LEFT INGUINAL HERNIA (K40.90) Impression: Large inguinal hernia going down the left scrotum but small defect. Would benefit from repair. We do laparoscopic underlay reproach. That way position one the ports  periumbilically primary repair of the umbilical hernia as well. Also provides after to make sure there is no evidence of any hernias on the right side that would warrant repair as well.  ENCOUNTER FOR PRE-OP - GROIN HERNIA (Z01.818)  Current Plans You are being scheduled for surgery - Our schedulers will call you.  You should hear from our office's scheduling department within 5 working days about the location, date, and time of surgery. We try to make accommodations for patient's preferences in scheduling surgery, but sometimes the OR schedule or the surgeon's schedule prevents [place] from making those accommodations.  If you have not heard from our office [phone]) in 5 working days, call the office and ask for your surgeon's nurse.  If you have other questions about your diagnosis, plan, or surgery, call the office and ask for your surgeon's nurse.  Pt Education - Pamphlet Given - Laparoscopic Hernia Repair: discussed with patient and provided information. The anatomy & physiology of the abdominal wall and pelvic floor was discussed. The pathophysiology of hernias in the inguinal and pelvic region was discussed. Natural history risks such as progressive enlargement, pain, incarceration, and strangulation was discussed. Contributors to complications such as smoking, obesity, diabetes, prior surgery, etc were discussed.  I feel the risks of no intervention will lead to serious problems that outweigh the operative risks; therefore, I recommended surgery to reduce and repair the hernia. I explained laparoscopic techniques with possible need for an open approach. I noted usual use of mesh to patch and/or buttress hernia repair  Risks such as bleeding, infection, abscess, need for further treatment, heart attack, death, and other risks were discussed. I noted a good likelihood this will help address the problem. Goals of post-operative recovery were discussed as well. Possibility that this will not  correct all symptoms was explained. I stressed the importance of low-impact activity, aggressive pain control, avoiding constipation, & not pushing through pain to minimize risk of post-operative chronic pain or injury. Possibility of reherniation was discussed. We will work to minimize complications.  An educational handout further explaining the pathology & treatment options was given as well. Questions were answered. The patient expresses understanding &  wishes to proceed with surgery.  Pt Education - [facility] Hernia Post-Op HCI (Jorje Vanatta): discussed with patient and provided information. Pt Education - [facility] Pain Control (Defne Gerling) UMBILICAL HERNIA WITHOUT OBSTRUCTION AND WITHOUT GANGRENE (K42.9) Impression: Small enough that sensitive. Most likely can be just primarily repaired at the time of the inguinal hernia surgery.  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 Not consistent with condyloma, hemorrhoid, fissure, fistula, pilonidal disease. I would think he would benefit from at least in an intermittent short course of antiviral therapy. Follow up with infectious disease to make sure that CD4 counts etc. are good & stable. See if he needs to stay on Valtrex indefinitely given his immunosuppressed HIV positive status. Don't know if this HSV outbreak now pushes it into the AIDS category.  Current Plans Started ValACYclovir HCl 500MG , 1 (one) Tablet daily, #20, 07/20/2015, Ref. x2. SCALP MASS (R22.0) Impression: Ellipsoid soft subcutaneous mass on scalp most likely consistent with lipoma. Doubt epidermal cyst.  Reasonable to excise since it is getting larger and occasionally uncomfortable. Hopefully could just excise and avoid drain. Absorbable sutures. Due to procedure at the same time of hernia repairs  Current Plans Schedule for Surgery The pathophysiology of skin & subcutaneous masses was discussed.  Natural history risks without surgery were discussed. I recommended surgery to remove the mass. I explained the technique of removal with use of local anesthesia & possible need for more aggressive sedation/anesthesia for patient comfort.  Risks such as bleeding, infection, wound breakdown, heart attack, death, and other risks were discussed. I noted a good likelihood this will help address the problem. Possibility that this will not correct all symptoms was explained. Possibility of regrowth/recurrence of the mass was discussed. We will work to minimize complications. Questions were answered. The patient expresses understanding & wishes to proceed with surgery.  TOBACCO ABUSE (Z72.0)  Current Plans Pt Education - [facility] STOP SMOKING!  Christopher Farrell, M.D., F.A.C.S. Gastrointestinal and Minimally Invasive Surgery [facility] [address], [place], [place] [address] [phone] Main / Paging

## 2015-08-10 ENCOUNTER — Other Ambulatory Visit (INDEPENDENT_AMBULATORY_CARE_PROVIDER_SITE_OTHER): Payer: BLUE CROSS/BLUE SHIELD

## 2015-08-10 DIAGNOSIS — Z113 Encounter for screening for infections with a predominantly sexual mode of transmission: Secondary | ICD-10-CM | POA: Diagnosis not present

## 2015-08-10 DIAGNOSIS — B20 Human immunodeficiency virus [HIV] disease: Secondary | ICD-10-CM

## 2015-08-10 LAB — COMPLETE METABOLIC PANEL WITH GFR
ALT: 12 U/L (ref 9–46)
AST: 15 U/L (ref 10–35)
Albumin: 4 g/dL (ref 3.6–5.1)
Alkaline Phosphatase: 47 U/L (ref 40–115)
BUN: 14 mg/dL (ref 7–25)
CHLORIDE: 104 mmol/L (ref 98–110)
CO2: 29 mmol/L (ref 20–31)
CREATININE: 0.85 mg/dL (ref 0.70–1.33)
Calcium: 9.1 mg/dL (ref 8.6–10.3)
GFR, Est African American: >89 mL/min (ref >=60)
GFR, Est Non African American: >89 mL/min (ref >=60)
GLUCOSE: 103 mg/dL — AB (ref 65–99)
Potassium: 3.9 mmol/L (ref 3.5–5.3)
SODIUM: 141 mmol/L (ref 135–146)
Total Bilirubin: 0.4 mg/dL (ref 0.2–1.2)
Total Protein: 6.8 g/dL (ref 6.1–8.1)

## 2015-08-10 LAB — CBC WITH DIFFERENTIAL/PLATELET
BASOS ABS: 0 {cells}/uL (ref 0–200)
Basophils Relative: 0 %
EOS PCT: 3 %
Eosinophils Absolute: 255 cells/uL (ref 15–500)
HCT: 39.5 % (ref 38.5–50.0)
Hemoglobin: 13.1 g/dL — ABNORMAL LOW (ref 13.2–17.1)
LYMPHS PCT: 35 %
Lymphs Abs: 2975 cells/uL (ref 850–3900)
MCH: 29.4 pg (ref 27.0–33.0)
MCHC: 33.2 g/dL (ref 32.0–36.0)
MCV: 88.6 fL (ref 80.0–100.0)
MPV: 8.4 fL (ref 7.5–12.5)
Monocytes Absolute: 595 cells/uL (ref 200–950)
Monocytes Relative: 7 %
Neutro Abs: 4675 cells/uL (ref 1500–7800)
Neutrophils Relative %: 55 %
PLATELETS: 257 10*3/uL (ref 140–400)
RBC: 4.46 MIL/uL (ref 4.20–5.80)
RDW: 14.8 % (ref 11.0–15.0)
WBC: 8.5 10*3/uL (ref 3.8–10.8)

## 2015-08-11 LAB — URINE CYTOLOGY ANCILLARY ONLY
CHLAMYDIA, DNA PROBE: NEGATIVE
NEISSERIA GONORRHEA: NEGATIVE

## 2015-08-11 LAB — T-HELPER CELL (CD4) - (RCID CLINIC ONLY)
CD4 T CELL HELPER: 40 % (ref 33–55)
CD4 T Cell Abs: 1210 /uL (ref 400–2700)

## 2015-08-11 LAB — HIV-1 RNA QUANT-NO REFLEX-BLD: HIV-1 RNA Quant, Log: <1.3 Log copies/mL (ref <1.30)

## 2015-08-12 ENCOUNTER — Telehealth: Payer: Self-pay | Admitting: *Deleted

## 2015-08-12 ENCOUNTER — Encounter: Payer: Self-pay | Admitting: Infectious Disease

## 2015-08-12 LAB — RPR TITER

## 2015-08-12 LAB — FLUORESCENT TREPONEMAL AB(FTA)-IGG-BLD: Fluorescent Treponemal ABS: REACTIVE — AB

## 2015-08-12 LAB — RPR: RPR: REACTIVE — AB

## 2015-08-12 NOTE — Telephone Encounter (Signed)
Received shipment of bicillin today, we have the supply available - would you prefer the 3 doses? Will call patient with your orders. Landis Gandy, RN

## 2015-08-12 NOTE — Telephone Encounter (Signed)
-----   Message from Truman Hayward, MD sent at 08/12/2015  8:52 AM EDT ----- Patient with syphilis. He at minimum of one shot of PCN unless the shortage of PCN has stopped in which case  He needs weekly PCN 2.4 MU x THREE

## 2015-08-12 NOTE — Telephone Encounter (Signed)
Left message asking patient to call for lab results.

## 2015-08-12 NOTE — Telephone Encounter (Signed)
Yes I would prefer the 3 doses

## 2015-08-13 NOTE — Telephone Encounter (Signed)
Thanks Michelle

## 2015-08-18 ENCOUNTER — Other Ambulatory Visit: Payer: Self-pay | Admitting: Infectious Disease

## 2015-08-18 DIAGNOSIS — B20 Human immunodeficiency virus [HIV] disease: Secondary | ICD-10-CM

## 2015-08-18 LAB — HLA B*5701: HLA-B*5701 w/rflx HLA-B High: NEGATIVE

## 2015-08-20 ENCOUNTER — Other Ambulatory Visit (HOSPITAL_COMMUNITY): Payer: Self-pay | Admitting: Surgery

## 2015-08-20 ENCOUNTER — Telehealth: Payer: Self-pay

## 2015-08-20 NOTE — Progress Notes (Signed)
Cbc, cmp 08/10/15 epic

## 2015-08-20 NOTE — Telephone Encounter (Signed)
Larene Beach from [facility] called wanting to verify patient's medication regimen. Verified that patient is currently on [place]. Larene Beach advised LPN that pharmacy had not filled medication for 2 months. Will notify Dr. Tommy Medal of information. Rodman Key, LPN

## 2015-08-20 NOTE — Patient Instructions (Addendum)
HERNESTO GURRY  08/20/2015   Your procedure is scheduled on: 08/28/15  Report to [facility] Main  Entrance take [place]  elevators to 3rd floor to  [place] at 1145 AM  Call this number if you have problems the morning of surgery [phone]   Remember: ONLY 1 PERSON MAY GO WITH YOU TO SHORT STAY TO GET  READY MORNING OF YOUR SURGERY.  Do not eat food  :After Midnight.THURSDAY NIGHT---MAY HAVE CLEAR LIQUIDS Friday AM UNTIL 0645AM-THEN NOTHING      Take these medicines the morning of surgery with A SIP OF WATER:  NONE DO NOT TAKE ANY DIABETIC MEDICATIONS DAY OF YOUR SURGERY                               You may not have any metal on your body including hair pins and              piercings  Do not wear jewelry, make-up, lotions, powders or perfumes, deodorant             Do not wear nail polish.  Do not shave  48 hours prior to surgery.              Men may shave face and neck.   Do not bring valuables to the hospital. [place].  Contacts, dentures or bridgework may not be worn into surgery.  Leave suitcase in the car. After surgery it may be brought to your room.     Patients discharged the day of surgery will not be allowed to drive home.  Name and phone number of your driver:  Special Instructions: MOM              Please read over the following fact sheets you were given: _____________________________________________________________________             [facility] - Preparing for Surgery Before surgery, you can play an important role.  Because skin is not sterile, your skin needs to be as free of germs as possible.  You can reduce the number of germs on your skin by washing with CHG (chlorahexidine gluconate) soap before surgery.  CHG is an antiseptic cleaner which kills germs and bonds with the skin to continue killing germs even after washing. Please DO NOT use if you have an allergy to CHG  or antibacterial soaps.  If your skin becomes reddened/irritated stop using the CHG and inform your nurse when you arrive at Short Stay. Do not shave (including legs and underarms) for at least 48 hours prior to the first CHG shower.  You may shave your face/neck. Please follow these instructions carefully:  1.  Shower with CHG Soap the night before surgery and the  morning of Surgery.  2.  If you choose to wash your hair, wash your hair first as usual with your  normal  shampoo.  3.  After you shampoo, rinse your hair and body thoroughly to remove the  shampoo.                           4.  Use CHG as you would any other liquid soap.  You can apply chg  directly  to the skin and wash                       Gently with a scrungie or clean washcloth.  5.  Apply the CHG Soap to your body ONLY FROM THE NECK DOWN.   Do not use on face/ open                           Wound or open sores. Avoid contact with eyes, ears mouth and genitals (private parts).                       Wash face,  Genitals (private parts) with your normal soap.             6.  Wash thoroughly, paying special attention to the area where your surgery  will be performed.  7.  Thoroughly rinse your body with warm water from the neck down.  8.  DO NOT shower/wash with your normal soap after using and rinsing off  the CHG Soap.                9.  Pat yourself dry with a clean towel.            10.  Wear clean pajamas.            11.  Place clean sheets on your bed the night of your first shower and do not  sleep with pets. Day of Surgery : Do not apply any lotions/deodorants the morning of surgery.  Please wear clean clothes to the hospital/surgery center.  FAILURE TO FOLLOW THESE INSTRUCTIONS MAY RESULT IN THE CANCELLATION OF YOUR SURGERY PATIENT SIGNATURE_________________________________  NURSE SIGNATURE__________________________________  ________________________________________________________________________    CLEAR LIQUID  DIET   Foods Allowed                                                                     Foods Excluded  Coffee and tea, regular and decaf                             liquids that you cannot  Plain Jell-O in any flavor                                             see through such as: Fruit ices (not with fruit pulp)                                     milk, soups, orange juice  Iced Popsicles                                    All solid food Carbonated beverages, regular and diet  Cranberry, grape and apple juices Sports drinks like Gatorade Lightly seasoned clear broth or consume(fat free) Sugar, honey syrup  Sample Menu Breakfast                                Lunch                                     Supper Cranberry juice                    Beef broth                            Chicken broth Jell-O                                     Grape juice                           Apple juice Coffee or tea                        Jell-O                                      Popsicle                                                Coffee or tea                        Coffee or tea  _____________________________________________________________________

## 2015-08-20 NOTE — Telephone Encounter (Signed)
I would check with Sharyn Lull. He probably did [employer] and got meds from them his viral load is undetectable THIS month

## 2015-08-21 ENCOUNTER — Encounter (HOSPITAL_COMMUNITY)
Admission: RE | Admit: 2015-08-21 | Discharge: 2015-08-21 | Disposition: A | Discharge status: Home / Self Care | Payer: BLUE CROSS/BLUE SHIELD | Source: Ambulatory Visit | Attending: Surgery | Admitting: Surgery

## 2015-08-21 ENCOUNTER — Encounter (HOSPITAL_COMMUNITY): Payer: Self-pay

## 2015-08-21 DIAGNOSIS — K4090 Unilateral inguinal hernia, without obstruction or gangrene, not specified as recurrent: Secondary | ICD-10-CM | POA: Insufficient documentation

## 2015-08-21 DIAGNOSIS — Z01812 Encounter for preprocedural laboratory examination: Secondary | ICD-10-CM | POA: Insufficient documentation

## 2015-08-21 DIAGNOSIS — R220 Localized swelling, mass and lump, head: Secondary | ICD-10-CM | POA: Diagnosis not present

## 2015-08-21 LAB — SURGICAL PCR SCREEN
MRSA, PCR: NEGATIVE
STAPHYLOCOCCUS AUREUS: NEGATIVE

## 2015-08-24 ENCOUNTER — Ambulatory Visit: Payer: Self-pay | Admitting: Infectious Disease

## 2015-08-28 ENCOUNTER — Encounter (HOSPITAL_COMMUNITY): Payer: Self-pay | Admitting: Anesthesiology

## 2015-08-28 ENCOUNTER — Ambulatory Visit (HOSPITAL_COMMUNITY): Payer: BLUE CROSS/BLUE SHIELD | Admitting: Anesthesiology

## 2015-08-28 ENCOUNTER — Ambulatory Visit (HOSPITAL_COMMUNITY)
Admission: RE | Admit: 2015-08-28 | Discharge: 2015-08-28 | Disposition: A | Discharge status: Home / Self Care | Payer: BLUE CROSS/BLUE SHIELD | Source: Ambulatory Visit | Attending: Surgery | Admitting: Surgery

## 2015-08-28 ENCOUNTER — Encounter (HOSPITAL_COMMUNITY)
Admission: RE | Disposition: A | Discharge status: Home / Self Care | Payer: Self-pay | Source: Ambulatory Visit | Attending: Surgery

## 2015-08-28 DIAGNOSIS — D1739 Benign lipomatous neoplasm of skin and subcutaneous tissue of other sites: Secondary | ICD-10-CM | POA: Insufficient documentation

## 2015-08-28 DIAGNOSIS — F17210 Nicotine dependence, cigarettes, uncomplicated: Secondary | ICD-10-CM | POA: Diagnosis not present

## 2015-08-28 DIAGNOSIS — B20 Human immunodeficiency virus [HIV] disease: Secondary | ICD-10-CM | POA: Diagnosis present

## 2015-08-28 DIAGNOSIS — K429 Umbilical hernia without obstruction or gangrene: Secondary | ICD-10-CM | POA: Insufficient documentation

## 2015-08-28 DIAGNOSIS — A601 Herpesviral infection of perianal skin and rectum: Secondary | ICD-10-CM | POA: Diagnosis not present

## 2015-08-28 DIAGNOSIS — N433 Hydrocele, unspecified: Secondary | ICD-10-CM | POA: Diagnosis not present

## 2015-08-28 DIAGNOSIS — Z21 Asymptomatic human immunodeficiency virus [HIV] infection status: Secondary | ICD-10-CM | POA: Insufficient documentation

## 2015-08-28 DIAGNOSIS — K4090 Unilateral inguinal hernia, without obstruction or gangrene, not specified as recurrent: Secondary | ICD-10-CM | POA: Diagnosis present

## 2015-08-28 DIAGNOSIS — K4091 Unilateral inguinal hernia, without obstruction or gangrene, recurrent: Secondary | ICD-10-CM | POA: Insufficient documentation

## 2015-08-28 DIAGNOSIS — B009 Herpesviral infection, unspecified: Secondary | ICD-10-CM | POA: Diagnosis present

## 2015-08-28 DIAGNOSIS — R220 Localized swelling, mass and lump, head: Secondary | ICD-10-CM

## 2015-08-28 HISTORY — DX: Accidental discharge from unspecified firearms or gun, initial encounter: W34.00XA

## 2015-08-28 HISTORY — DX: Cellulitis of unspecified toe: L03.039

## 2015-08-28 HISTORY — DX: Unspecified fracture of right femur, initial encounter for closed fracture: S72.91XA

## 2015-08-28 HISTORY — PX: INGUINAL HERNIA REPAIR: SHX194

## 2015-08-28 HISTORY — DX: Cutaneous abscess of unspecified foot: L02.619

## 2015-08-28 HISTORY — PX: LESION EXCISION: SHX5167

## 2015-08-28 SURGERY — REPAIR, HERNIA, INGUINAL, LAPAROSCOPIC
Anesthesia: General | Site: Head | Laterality: Left

## 2015-08-28 MED ORDER — PROPOFOL 10 MG/ML IV BOLUS
INTRAVENOUS | Status: DC | PRN
  Administered 2015-08-28: 150 mg via INTRAVENOUS

## 2015-08-28 MED ORDER — CEFAZOLIN SODIUM-DEXTROSE 2-4 GM/100ML-% IV SOLN
2.0000 g | INTRAVENOUS | Status: AC
  Administered 2015-08-28: 2 g via INTRAVENOUS

## 2015-08-28 MED ORDER — DEXAMETHASONE SODIUM PHOSPHATE 10 MG/ML IJ SOLN
INTRAMUSCULAR | Status: AC
  Filled 2015-08-28: qty 1

## 2015-08-28 MED ORDER — PROPOFOL 10 MG/ML IV BOLUS
INTRAVENOUS | Status: AC
  Filled 2015-08-28: qty 20

## 2015-08-28 MED ORDER — SUGAMMADEX SODIUM 200 MG/2ML IV SOLN
INTRAVENOUS | Status: DC | PRN
  Administered 2015-08-28: 200 mg via INTRAVENOUS

## 2015-08-28 MED ORDER — 0.9 % SODIUM CHLORIDE (POUR BTL) OPTIME
TOPICAL | Status: DC | PRN
  Administered 2015-08-28: 1000 mL

## 2015-08-28 MED ORDER — ACETAMINOPHEN 650 MG RE SUPP
650.0000 mg | RECTAL | Status: DC | PRN
  Filled 2015-08-28: qty 1

## 2015-08-28 MED ORDER — FENTANYL CITRATE (PF) 250 MCG/5ML IJ SOLN
INTRAMUSCULAR | Status: AC
  Filled 2015-08-28: qty 5

## 2015-08-28 MED ORDER — ONDANSETRON HCL 4 MG/2ML IJ SOLN
INTRAMUSCULAR | Status: AC
Start: 2015-08-28 — End: 2015-08-28
  Filled 2015-08-28: qty 2

## 2015-08-28 MED ORDER — LACTATED RINGERS IR SOLN
Status: DC | PRN
  Administered 2015-08-28: 1000 mL

## 2015-08-28 MED ORDER — SODIUM CHLORIDE 0.9 % IV SOLN: 250.0000 mL | INTRAVENOUS | Status: DC | PRN

## 2015-08-28 MED ORDER — ROCURONIUM BROMIDE 100 MG/10ML IV SOLN
INTRAVENOUS | Status: AC
Start: 2015-08-28 — End: 2015-08-28
  Filled 2015-08-28: qty 1

## 2015-08-28 MED ORDER — DEXAMETHASONE SODIUM PHOSPHATE 10 MG/ML IJ SOLN
INTRAMUSCULAR | Status: DC | PRN
Start: 2015-08-28 — End: 2015-08-28
  Administered 2015-08-28: 10 mg via INTRAVENOUS

## 2015-08-28 MED ORDER — LIDOCAINE HCL (PF) 2 % IJ SOLN
INTRAMUSCULAR | Status: DC | PRN
  Administered 2015-08-28: 30 mg via INTRADERMAL

## 2015-08-28 MED ORDER — SUGAMMADEX SODIUM 200 MG/2ML IV SOLN
INTRAVENOUS | Status: AC
  Filled 2015-08-28: qty 2

## 2015-08-28 MED ORDER — BUPIVACAINE-EPINEPHRINE 0.25% -1:200000 IJ SOLN
INTRAMUSCULAR | Status: DC | PRN
  Administered 2015-08-28: 60 mL

## 2015-08-28 MED ORDER — ONDANSETRON HCL 4 MG/2ML IJ SOLN
INTRAMUSCULAR | Status: DC | PRN
  Administered 2015-08-28: 4 mg via INTRAVENOUS

## 2015-08-28 MED ORDER — MEPERIDINE HCL 50 MG/ML IJ SOLN: 6.2500 mg | INTRAMUSCULAR | Status: DC | PRN

## 2015-08-28 MED ORDER — SODIUM CHLORIDE 0.9% FLUSH: 3.0000 mL | Freq: Two times a day (BID) | INTRAVENOUS | Status: DC

## 2015-08-28 MED ORDER — HYDROMORPHONE HCL 1 MG/ML IJ SOLN
0.2500 mg | INTRAMUSCULAR | Status: DC | PRN
  Administered 2015-08-28 (×2): 0.5 mg via INTRAVENOUS

## 2015-08-28 MED ORDER — SODIUM CHLORIDE 0.9% FLUSH: 3.0000 mL | INTRAVENOUS | Status: DC | PRN

## 2015-08-28 MED ORDER — ROCURONIUM BROMIDE 100 MG/10ML IV SOLN
INTRAVENOUS | Status: DC | PRN
  Administered 2015-08-28: 40 mg via INTRAVENOUS
  Administered 2015-08-28 (×3): 10 mg via INTRAVENOUS

## 2015-08-28 MED ORDER — BUPIVACAINE-EPINEPHRINE 0.25% -1:200000 IJ SOLN
INTRAMUSCULAR | Status: AC
  Filled 2015-08-28: qty 1

## 2015-08-28 MED ORDER — MIDAZOLAM HCL 2 MG/2ML IJ SOLN
INTRAMUSCULAR | Status: AC
  Filled 2015-08-28: qty 2

## 2015-08-28 MED ORDER — NAPROXEN 500 MG PO TABS: 500.0000 mg | ORAL_TABLET | Freq: Two times a day (BID) | ORAL | Status: DC

## 2015-08-28 MED ORDER — ONDANSETRON HCL 4 MG/2ML IJ SOLN: 4.0000 mg | Freq: Once | INTRAMUSCULAR | Status: DC | PRN

## 2015-08-28 MED ORDER — MIDAZOLAM HCL 5 MG/5ML IJ SOLN
INTRAMUSCULAR | Status: DC | PRN
  Administered 2015-08-28: 2 mg via INTRAVENOUS

## 2015-08-28 MED ORDER — OXYCODONE HCL 5 MG PO TABS: 5.0000 mg | ORAL_TABLET | Freq: Four times a day (QID) | ORAL | Status: DC | PRN

## 2015-08-28 MED ORDER — OXYCODONE HCL 5 MG PO TABS
5.0000 mg | ORAL_TABLET | Freq: Four times a day (QID) | ORAL | Status: AC | PRN
Start: 2015-08-28 — End: 2015-08-28
  Administered 2015-08-28: 5 mg via ORAL
  Filled 2015-08-28: qty 1

## 2015-08-28 MED ORDER — FENTANYL CITRATE (PF) 100 MCG/2ML IJ SOLN
INTRAMUSCULAR | Status: DC | PRN
  Administered 2015-08-28 (×2): 50 ug via INTRAVENOUS
  Administered 2015-08-28: 100 ug via INTRAVENOUS
  Administered 2015-08-28: 50 ug via INTRAVENOUS

## 2015-08-28 MED ORDER — OXYCODONE HCL 5 MG PO TABS: 5.0000 mg | ORAL_TABLET | ORAL | Status: DC | PRN

## 2015-08-28 MED ORDER — ACETAMINOPHEN 325 MG PO TABS: 650.0000 mg | ORAL_TABLET | ORAL | Status: DC | PRN

## 2015-08-28 MED ORDER — VALACYCLOVIR HCL 1 G PO TABS: 1000.0000 mg | ORAL_TABLET | Freq: Two times a day (BID) | ORAL | Status: DC

## 2015-08-28 MED ORDER — HYDROMORPHONE HCL 1 MG/ML IJ SOLN
INTRAMUSCULAR | Status: AC
  Filled 2015-08-28: qty 1

## 2015-08-28 MED ORDER — FENTANYL CITRATE (PF) 100 MCG/2ML IJ SOLN: 25.0000 ug | INTRAMUSCULAR | Status: DC | PRN

## 2015-08-28 MED ORDER — CEFAZOLIN SODIUM-DEXTROSE 2-4 GM/100ML-% IV SOLN
INTRAVENOUS | Status: AC
  Filled 2015-08-28: qty 100

## 2015-08-28 MED ORDER — LACTATED RINGERS IV SOLN
INTRAVENOUS | Status: DC
  Administered 2015-08-28: 12:00:00 via INTRAVENOUS

## 2015-08-28 MED ORDER — LIDOCAINE HCL (CARDIAC) 20 MG/ML IV SOLN
INTRAVENOUS | Status: AC
  Filled 2015-08-28: qty 5

## 2015-08-28 SURGICAL SUPPLY — 38 items
CABLE HIGH FREQUENCY MONO STRZ (ELECTRODE) ×4 IMPLANT
CHLORAPREP W/TINT 26ML (MISCELLANEOUS) ×8 IMPLANT
CLOSURE WOUND 1/2 X4 (GAUZE/BANDAGES/DRESSINGS) ×2
COVER SURGICAL LIGHT HANDLE (MISCELLANEOUS) ×4 IMPLANT
DECANTER SPIKE VIAL GLASS SM (MISCELLANEOUS) ×4 IMPLANT
DEVICE SECURE STRAP 25 ABSORB (INSTRUMENTS) IMPLANT
DRAPE LAPAROSCOPIC ABDOMINAL (DRAPES) ×4 IMPLANT
DRAPE WARM FLUID 44X44 (DRAPE) ×4 IMPLANT
DRSG TEGADERM 2-3/8X2-3/4 SM (GAUZE/BANDAGES/DRESSINGS) ×8 IMPLANT
DRSG TEGADERM 4X4.75 (GAUZE/BANDAGES/DRESSINGS) ×8 IMPLANT
ELECT REM PT RETURN 9FT ADLT (ELECTROSURGICAL) ×4
ELECTRODE REM PT RTRN 9FT ADLT (ELECTROSURGICAL) ×2 IMPLANT
GAUZE SPONGE 2X2 8PLY STRL LF (GAUZE/BANDAGES/DRESSINGS) ×4 IMPLANT
GLOVE ECLIPSE 8.0 STRL XLNG CF (GLOVE) ×8 IMPLANT
GLOVE INDICATOR 8.0 STRL GRN (GLOVE) ×8 IMPLANT
GOWN STRL REUS W/TWL XL LVL3 (GOWN DISPOSABLE) ×16 IMPLANT
KIT BASIN OR (CUSTOM PROCEDURE TRAY) ×4 IMPLANT
MESH ULTRAPRO 6X6 15CM15CM (Mesh General) ×12 IMPLANT
PAD POSITIONING PINK XL (MISCELLANEOUS) ×4 IMPLANT
SCISSORS LAP 5X35 DISP (ENDOMECHANICALS) ×4 IMPLANT
SET IRRIG TUBING LAPAROSCOPIC (IRRIGATION / IRRIGATOR) IMPLANT
SLEEVE ADV FIXATION 5X100MM (TROCAR) ×4 IMPLANT
SPONGE GAUZE 2X2 STER 10/PKG (GAUZE/BANDAGES/DRESSINGS) ×4
STRIP CLOSURE SKIN 1/2X4 (GAUZE/BANDAGES/DRESSINGS) ×6 IMPLANT
SUCTION FRAZIER 12FR DISP (SUCTIONS) ×4 IMPLANT
SUT MNCRL AB 4-0 PS2 18 (SUTURE) ×8 IMPLANT
SUT PDS AB 1 CTX 36 (SUTURE) ×8 IMPLANT
SUT VIC AB 3-0 SH 18 (SUTURE) ×4 IMPLANT
SUT VIC AB 3-0 SH 27 (SUTURE) ×2
SUT VIC AB 3-0 SH 27XBRD (SUTURE) ×2 IMPLANT
TACKER 5MM HERNIA 3.5CML NAB (ENDOMECHANICALS) IMPLANT
TOWEL OR 17X26 10 PK STRL BLUE (TOWEL DISPOSABLE) ×4 IMPLANT
TRAY LAPAROSCOPIC (CUSTOM PROCEDURE TRAY) ×4 IMPLANT
TROCAR ADV FIXATION 5X100MM (TROCAR) ×4 IMPLANT
TROCAR XCEL BLUNT TIP 100MML (ENDOMECHANICALS) ×4 IMPLANT
TUBING CONNECTING 10 (TUBING) ×3 IMPLANT
TUBING CONNECTING 10' (TUBING) ×1
TUBING INSUF HEATED (TUBING) ×4 IMPLANT

## 2015-08-28 NOTE — Progress Notes (Signed)
Pt met all criteria for discharge at 1725nd was resting comfortably in bed. Mom was late returning to pick him up and she had his clothing. Pt was discharged at [place] via w/c accompanied by staff and Mom to her car.

## 2015-08-28 NOTE — Anesthesia Preprocedure Evaluation (Addendum)
Anesthesia Evaluation  Patient identified by MRN, date of birth, ID band Patient awake    Reviewed: Allergy & Precautions, NPO status , Patient's Chart, lab work & pertinent test results  Airway Mallampati: I  TM Distance: >3 FB Neck ROM: Full    Dental  (+) Edentulous Lower, Chipped,    Pulmonary Current Smoker,    Pulmonary exam normal breath sounds clear to auscultation       Cardiovascular negative cardio ROS Normal cardiovascular exam Rhythm:Regular Rate:Normal     Neuro/Psych PSYCHIATRIC DISORDERS    GI/Hepatic negative GI ROS, (+)     substance abuse  marijuana use,   Endo/Other  negative endocrine ROS  Renal/GU   negative genitourinary   Musculoskeletal Left Inguinal Hernia   Abdominal   Peds  Hematology negative hematology ROS (+) HIV +   Anesthesia Other Findings   Reproductive/Obstetrics                            Anesthesia Physical Anesthesia Plan  ASA: II  Anesthesia Plan: General   Post-op Pain Management:    Induction: Intravenous  Airway Management Planned: Oral ETT  Additional Equipment:   Intra-op Plan:   Post-operative Plan: Extubation in OR  Informed Consent: I have reviewed the patients History and Physical, chart, labs and discussed the procedure including the risks, benefits and alternatives for the proposed anesthesia with the patient or authorized representative who has indicated his/her understanding and acceptance.   Dental advisory given  Plan Discussed with: CRNA, Anesthesiologist and Surgeon  Anesthesia Plan Comments:         Anesthesia Quick Evaluation

## 2015-08-28 NOTE — Discharge Instructions (Signed)
HERNIA REPAIR: POST OP INSTRUCTIONS ° °###################################################################### ° °EAT °Gradually transition to a high fiber diet with a fiber supplement over the next few weeks after discharge.  Start with a pureed / full liquid diet (see below) ° °WALK °Walk an hour a day.  Control your pain to do that.   ° °CONTROL PAIN °Control pain so that you can walk, sleep, tolerate sneezing/coughing, go up/down stairs. ° °HAVE A BOWEL MOVEMENT DAILY °Keep your bowels regular to avoid problems.  OK to try a laxative to override constipation.  OK to use an antidairrheal to slow down diarrhea.  Call if not better after 2 tries ° °CALL IF YOU HAVE PROBLEMS/CONCERNS °Call if you are still struggling despite following these instructions. °Call if you have concerns not answered by these instructions ° °###################################################################### ° ° ° °1. DIET: Follow a light bland diet the first 24 hours after arrival home, such as soup, liquids, crackers, etc.  Be sure to include lots of fluids daily.  Avoid fast food or heavy meals as your are more likely to get nauseated.  Eat a low fat the next few days after surgery. °2. Take your usually prescribed home medications unless otherwise directed. °3. PAIN CONTROL: °a. Pain is best controlled by a usual combination of three different methods TOGETHER: °i. Ice/Heat °ii. Over the counter pain medication °iii. Prescription pain medication °b. Most patients will experience some swelling and bruising around the hernia(s) such as the bellybutton, groins, or old incisions.  Ice packs or heating pads (30-60 minutes up to 6 times a day) will help. Use ice for the first few days to help decrease swelling and bruising, then switch to heat to help relax tight/sore spots and speed recovery.  Some people prefer to use ice alone, heat alone, alternating between ice & heat.  Experiment to what works for you.  Swelling and bruising can take  several weeks to resolve.   °c. It is helpful to take an over-the-counter pain medication regularly for the first few weeks.  Choose one of the following that works best for you: °i. Naproxen (Aleve, etc)  Two 220mg tabs twice a day °ii. Ibuprofen (Advil, etc) Three 200mg tabs four times a day (every meal & bedtime) °iii. Acetaminophen (Tylenol, etc) 325-650mg four times a day (every meal & bedtime) °d. A  prescription for pain medication should be given to you upon discharge.  Take your pain medication as prescribed.  °i. If you are having problems/concerns with the prescription medicine (does not control pain, nausea, vomiting, rash, itching, etc), please call us [phone] to see if we need to switch you to a different pain medicine that will work better for you and/or control your side effect better. °ii. If you need a refill on your pain medication, please contact your pharmacy.  They will contact our office to request authorization. Prescriptions will not be filled after 5 pm or on week-ends. °4. Avoid getting constipated.  Between the surgery and the pain medications, it is common to experience some constipation.  Increasing fluid intake and taking a fiber supplement (such as Metamucil, Citrucel, FiberCon, MiraLax, etc) 1-2 times a day regularly will usually help prevent this problem from occurring.  A mild laxative (prune juice, Milk of Magnesia, MiraLax, etc) should be taken according to package directions if there are no bowel movements after 48 hours.   °5. Wash / shower every day.  You may shower over the dressings as they are waterproof.   °6. Remove   your waterproof bandages 5 days after surgery.  You may leave the incision open to air.  You may replace a dressing/Band-Aid to cover the incision for comfort if you wish.  Continue to shower over incision(s) after the dressing is off. ° ° ° °7. ACTIVITIES as tolerated:   °a. You may resume regular (light) daily activities beginning the next day--such  as daily self-care, walking, climbing stairs--gradually increasing activities as tolerated.  If you can walk 30 minutes without difficulty, it is safe to try more intense activity such as jogging, treadmill, bicycling, low-impact aerobics, swimming, etc. °b. Save the most intensive and strenuous activity for last such as sit-ups, heavy lifting, contact sports, etc  Refrain from any heavy lifting or straining until you are off narcotics for pain control.   °c. DO NOT PUSH THROUGH PAIN.  Let pain be your guide: If it hurts to do something, don't do it.  Pain is your body warning you to avoid that activity for another week until the pain goes down. °d. You may drive when you are no longer taking prescription pain medication, you can comfortably wear a seatbelt, and you can safely maneuver your car and apply brakes. °e. You may have sexual intercourse when it is comfortable.  °8. FOLLOW UP in our office °a. Please call [facility] at [phone] to set up an appointment to see your surgeon in the office for a follow-up appointment approximately 2-3 weeks after your surgery. °b. Make sure that you call for this appointment the day you arrive home to insure a convenient appointment time. °9.  IF YOU HAVE DISABILITY OR FAMILY LEAVE FORMS, BRING THEM TO THE OFFICE FOR PROCESSING.  DO NOT GIVE THEM TO YOUR DOCTOR. ° °WHEN TO CALL US [phone]: °1. Poor pain control °2. Reactions / problems with new medications (rash/itching, nausea, etc)  °3. Fever over 101.5 F (38.5 C) °4. Inability to urinate °5. Nausea and/or vomiting °6. Worsening swelling or bruising °7. Continued bleeding from incision. °8. Increased pain, redness, or drainage from the incision ° ° The clinic staff is available to answer your questions during regular business hours (8:30am-5pm).  Please don’t hesitate to call and ask to speak to one of our nurses for clinical concerns.  ° If you have a medical emergency, go to the nearest emergency room or call  911. ° A surgeon from [facility] is always on call at the hospitals in [place] ° °[facility] °[address], [address], [place], [place]  [address] ? ° [address] [address], [place], [place]   [address] °MAIN: [phone] ? TOLL FREE: [phone] ? FAX: [phone] °[email or web address] ° ° °

## 2015-08-28 NOTE — H&P (Addendum)
Jodi Geralds  Location: [facility] Patient #: [id number] DOB: 1961/02/10 Single / Language: [name] / Race: Black or African American Male  Patient Care Team: Truman Hayward, MD as PCP - General (Infectious Diseases) Truman Hayward, MD as PCP - Infectious Diseases (Infectious Diseases) Frederik Pear, MD as Consulting Physician (Orthopedic Surgery) Michael Boston, MD as Consulting Physician (General Surgery) Truman Hayward, MD as Consulting Physician (Infectious Diseases)   History of Present Illness  The patient is a 55 year old male who presents for an evaluation of a hernia. Note for "Hernia": Patient sent by his infectious disease doctor, Dr. Roderic Scarce "Kees" [facility], concern for symptomatic left inguinal hernia as well as growing lump on his scalp. Patient also notes he's been having some anorectal pain and wonders if he has hemorrhoids.  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 Struggled with getting in keeping insurance. I better financial place. Wished to be evaluated for concerns. He's had a lump on his left scalp for many years. He is notices, larger more recently. Occasionally bothersome with headaches. Wondered if it could be removed. Patient also noted a lump in the left groin concerning for hernia a couple of years ago. Used to be intermittent pain and bulging. Now it is out all the time. I discussed enlarger. Now going into the scrotum. Was concerned. Discuss with his infectious disease doctor. Recommendation made to consider at least hernia repair if not removal of the enlarging scalp mass. He does smoke half pack cigarettes a day. Occasional beer and marijuana. No other drugs. Never had any abdominal or anorectal surgery. I don't think he's had a colonoscopy yet. Can walk a half hour without difficulty.  Patient also notes for the past couple weeks he's had pain with bowel movements and occasional bleeding. Very sore to wife. Uncomfortable  to sit. Does not recall any history of hemorrhoids. No history of condyloma. History of fissure or fistula or pilonidal problems. Usually moves his bowels about twice a day. No personal nor family history of GI/colon cancer, inflammatory bowel disease, irritable bowel syndrome, allergy such as Celiac Sprue, dietary/dairy problems, colitis, ulcers nor gastritis. No recent sick contacts/gastroenteritis. No travel outside the country. No changes in diet. No dysphagia to solids or liquids. No significant heartburn or reflux. No hematochezia, hematemesis, coffee ground emesis. No evidence of prior gastric/peptic ulceration.  No new events  Other Problems Illene Regulus, CMA; 07/20/2015 9:28 AM) Alcohol Abuse HIV-positive Pancreatitis  Diagnostic Studies History Lars Mage Spillers, CMA; 07/20/2015 9:28 AM) Colonoscopy never  Allergies Lars Mage Spillers, CMA; 07/20/2015 9:29 AM) No Known Drug Allergies05/15/2017  Medication History (Alisha Spillers, CMA; 07/20/2015 9:29 AM) No Current Medications Medications Reconciled  Social History Lars Mage Spillers, CMA; 07/20/2015 9:28 AM) Alcohol use Occasional alcohol use. Illicit drug use Uses weekly. No caffeine use Tobacco use Former smoker.  Family History Illene Regulus, [place]; 07/20/2015 9:28 AM) Cancer Father.    Review of Systems Lars Mage Spillers CMA; 07/20/2015 9:28 AM) General Present- Weight Loss. Not Present- Appetite Loss, Chills, Fatigue, Fever, Night Sweats and Weight Gain. Skin Not Present- Change in Wart/Mole, Dryness, Hives, Jaundice, New Lesions, Non-Healing Wounds, Rash and Ulcer. HEENT Not Present- Earache, Hearing Loss, Hoarseness, Nose Bleed, Oral Ulcers, Ringing in the Ears, Seasonal Allergies, Sinus Pain, Sore Throat, Visual Disturbances, Wears glasses/contact lenses and Yellow Eyes. Respiratory Present- Difficulty Breathing. Not Present- Bloody sputum, Chronic Cough, Snoring and Wheezing. Breast Not  Present- Breast Mass, Breast Pain, Nipple Discharge and Skin Changes. Gastrointestinal Not Present-  Abdominal Pain, Bloating, Bloody Stool, Change in Bowel Habits, Chronic diarrhea, Constipation, Difficulty Swallowing, Excessive gas, Gets full quickly at meals, Hemorrhoids, Indigestion, Nausea, Rectal Pain and Vomiting. Male Genitourinary Not Present- Blood in Urine, Change in Urinary Stream, Frequency, Impotence, Nocturia, Painful Urination, Urgency and Urine Leakage.  Vitals (Alisha Spillers CMA; 07/20/2015 9:29 AM) 07/20/2015 9:28 AM Weight: 130 lb Height: 64in Body Surface Area: 1.63 m Body Mass Index: 22.31 kg/m  Pulse: 78 (Regular)  BP: 114/70 (Sitting, Left Arm, Standard)       Physical Exam [name] MD; 07/20/2015 9:52 AM) General Mental Status-Alert. General Appearance-Not in acute distress, Not Sickly. Orientation-Oriented X3. Hydration-Well hydrated. Voice-Normal.  Integumentary Global Assessment Upon inspection and palpation of skin surfaces of the - Axillae: non-tender, no inflammation or ulceration, no drainage. and Distribution of scalp and body hair is normal. General Characteristics Temperature - normal warmth is noted.  Head and Neck Head-normocephalic, atraumatic with no lesions or palpable masses. Face Global Assessment - atraumatic, no absence of expression. Neck Global Assessment - no abnormal movements, no bruit auscultated on the right, no bruit auscultated on the left, no decreased range of motion, non-tender. Trachea-midline. Thyroid Gland Characteristics - non-tender. Note: Soft subcutaneous mass in the left frontal temporal region. 4 x 3 x 2 cm. Fixed to the scalp   Eye Eyeball - Left-Extraocular movements intact, No Nystagmus. Eyeball - Right-Extraocular movements intact, No Nystagmus. Cornea - Left-No Hazy. Cornea - Right-No Hazy. Sclera/Conjunctiva - Left-No scleral icterus, No  Discharge. Sclera/Conjunctiva - Right-No scleral icterus, No Discharge. Pupil - Left-Direct reaction to light normal. Pupil - Right-Direct reaction to light normal.  ENMT Ears Pinna - Left - no drainage observed, no generalized tenderness observed. Right - no drainage observed, no generalized tenderness observed. Nose and Sinuses External Inspection of the Nose - no destructive lesion observed. Inspection of the nares - Left - quiet respiration. Right - quiet respiration. Mouth and Throat Lips - Upper Lip - no fissures observed, no pallor noted. Lower Lip - no fissures observed, no pallor noted. Nasopharynx - no discharge present. Oral Cavity/Oropharynx - Tongue - no dryness observed. Oral Mucosa - no cyanosis observed. Hypopharynx - no evidence of airway distress observed.  Chest and Lung Exam Inspection Movements - Normal and Symmetrical. Accessory muscles - No use of accessory muscles in breathing. Palpation Palpation of the chest reveals - Non-tender. Auscultation Breath sounds - Normal and Clear.  Cardiovascular Auscultation Rhythm - Regular. Murmurs & Other Heart Sounds - Auscultation of the heart reveals - No Murmurs and No Systolic Clicks.  Abdomen Inspection Inspection of the abdomen reveals - No Visible peristalsis and No Abnormal pulsations. Umbilicus - No Bleeding, No Urine drainage. Palpation/Percussion Palpation and Percussion of the abdomen reveal - Soft, Non Tender, No Rebound tenderness, No Rigidity (guarding) and No Cutaneous hyperesthesia. Note: Soft and flat. Small umbilical hernia through the stalk. 5 mm   Male Genitourinary Sexual Maturity Tanner 5 - Adult hair pattern and Adult penile size and shape. Note: Large left inguinal hernia going down to scrotum reduces. No inguinal lymphadenopathy. No obvious hernia on the right side. Normal external genitalia. Epididymi, testes, and spermatic cords normal without any masses. No  condyloma.   Rectal Note: Perianal skin clear. Hypopigmented elevated very tender nodules with some beefiness associated with them. No purulence. Some more beefiness of the nodules at the anal verge. No fluctuance. No fissure. No fistula. Not consistent with hemorrhoids. Too tender be consistent with condyloma. Suspicious for herpes lesions. Examined  with Dr. Marcello Moores, my colorectal partner. She agrees.   Peripheral Vascular Upper Extremity Inspection - Left - No Cyanotic nailbeds, Not Ischemic. Right - No Cyanotic nailbeds, Not Ischemic.  Neurologic Neurologic evaluation reveals -normal attention span and ability to concentrate, able to name objects and repeat phrases. Appropriate fund of knowledge , normal sensation and normal coordination. Mental Status Affect - not angry, not paranoid. Cranial Nerves-Normal Bilaterally. Gait-Normal.  Neuropsychiatric Mental status exam performed with findings of-able to articulate well with normal speech/language, rate, volume and coherence, thought content normal with ability to perform basic computations and apply abstract reasoning and no evidence of hallucinations, delusions, obsessions or homicidal/suicidal ideation.  Musculoskeletal Global Assessment Spine, Ribs and Pelvis - no instability, subluxation or laxity. Right Upper Extremity - no instability, subluxation or laxity.  Lymphatic Head & Neck  General Head & Neck Lymphatics: Bilateral - Description - No Localized lymphadenopathy. Axillary  General Axillary Region: Bilateral - Description - No Localized lymphadenopathy. Femoral & Inguinal  Generalized Femoral & Inguinal Lymphatics: Left - Description - No Localized lymphadenopathy. Right - Description - No Localized lymphadenopathy.    Assessment & Plan  LEFT INGUINAL HERNIA (K40.90) Impression: Large inguinal hernia going down the left scrotum but small defect. Would benefit from repair. We do laparoscopic underlay  reproach. That way position one the ports periumbilically primary repair of the umbilical hernia as well. Also provides after to make sure there is no evidence of any hernias on the right side that would warrant repair as well.  ENCOUNTER FOR PRE-OP - GROIN HERNIA (Z01.818) Current Plans You are being scheduled for surgery - Our schedulers will call you.  You should hear from our office's scheduling department within 5 working days about the location, date, and time of surgery. We try to make accommodations for patient's preferences in scheduling surgery, but sometimes the OR schedule or the surgeon's schedule prevents [place] from making those accommodations.  If you have not heard from our office [phone]) in 5 working days, call the office and ask for your surgeon's nurse.  If you have other questions about your diagnosis, plan, or surgery, call the office and ask for your surgeon's nurse.  Pt Education - Pamphlet Given - Laparoscopic Hernia Repair: discussed with patient and provided information. The anatomy & physiology of the abdominal wall and pelvic floor was discussed. The pathophysiology of hernias in the inguinal and pelvic region was discussed. Natural history risks such as progressive enlargement, pain, incarceration, and strangulation was discussed. Contributors to complications such as smoking, obesity, diabetes, prior surgery, etc were discussed.  I feel the risks of no intervention will lead to serious problems that outweigh the operative risks; therefore, I recommended surgery to reduce and repair the hernia. I explained laparoscopic techniques with possible need for an open approach. I noted usual use of mesh to patch and/or buttress hernia repair  Risks such as bleeding, infection, abscess, need for further treatment, heart attack, death, and other risks were discussed. I noted a good likelihood this will help address the problem. Goals of post-operative recovery were discussed  as well. Possibility that this will not correct all symptoms was explained. I stressed the importance of low-impact activity, aggressive pain control, avoiding constipation, & not pushing through pain to minimize risk of post-operative chronic pain or injury. Possibility of reherniation was discussed. We will work to minimize complications.  An educational handout further explaining the pathology & treatment options was given as well. Questions were answered. The patient expresses understanding &  wishes to proceed with surgery.  Pt Education - [facility] Hernia Post-Op HCI (Reggie Bise): discussed with patient and provided information. Pt Education - [facility] Pain Control (Phillipa Morden)  UMBILICAL HERNIA WITHOUT OBSTRUCTION AND WITHOUT GANGRENE (K42.9) Impression: Small enough that sensitive. Most likely can be just primarily repaired at the time of the inguinal hernia surgery.  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 Not consistent with condyloma, hemorrhoid, fissure, fistula, pilonidal disease. I would think he would benefit from at least in an intermittent short course of antiviral therapy. Follow up with infectious disease to make sure that CD4 counts etc. are good & stable. See if he needs to stay on Valtrex indefinitely given his immunosuppressed HIV positive status. Don't know if this HSV outbreak now pushes it into the AIDS category. Current Plans Started ValACYclovir HCl 500MG , 1 (one) Tablet daily, #20, 07/20/2015, Ref. X2.  SCALP MASS (R22.0) Impression: Ellipsoid soft subcutaneous mass on scalp most likely consistent with lipoma. Doubt epidermal cyst.  Reasonable to excise since it is getting larger and occasionally uncomfortable. Hopefully could just excise and avoid drain. Absorbable sutures. Due to procedure at the same time of hernia repairs Current Plans Schedule for Surgery The pathophysiology of skin &  subcutaneous masses was discussed. Natural history risks without surgery were discussed. I recommended surgery to remove the mass. I explained the technique of removal with use of local anesthesia & possible need for more aggressive sedation/anesthesia for patient comfort.  Risks such as bleeding, infection, wound breakdown, heart attack, death, and other risks were discussed. I noted a good likelihood this will help address the problem. Possibility that this will not correct all symptoms was explained. Possibility of regrowth/recurrence of the mass was discussed. We will work to minimize complications. Questions were answered. The patient expresses understanding & wishes to proceed with surgery.  TOBACCO ABUSE (Z72.0) Current Plans Pt Education - [facility] STOP SMOKING!  [name], M.D., F.A.C.S. Gastrointestinal and Minimally Invasive Surgery [facility] [address] [place], [place] [address] [phone] Main / Paging

## 2015-08-28 NOTE — Anesthesia Procedure Notes (Signed)
Procedure Name: Intubation Date/Time: 08/28/2015 12:51 PM Performed by: Lajuana Carry E Pre-anesthesia Checklist: Patient identified, Emergency Drugs available, Suction available and Patient being monitored Patient Re-evaluated:Patient Re-evaluated prior to inductionOxygen Delivery Method: Circle system utilized Preoxygenation: Pre-oxygenation with 100% oxygen Intubation Type: IV induction Ventilation: Mask ventilation without difficulty Laryngoscope Size: Miller and 3 Grade View: Grade I Tube type: Oral Tube size: 7.5 mm Number of attempts: 1 Airway Equipment and Method: Stylet Placement Confirmation: ETT inserted through vocal cords under direct vision,  positive ETCO2 and breath sounds checked- equal and bilateral Secured at: 21 cm Tube secured with: Tape Dental Injury: Teeth and Oropharynx as per pre-operative assessment

## 2015-08-28 NOTE — Anesthesia Postprocedure Evaluation (Signed)
Anesthesia Post Note  Patient: SEMAJAY SINGLE  Procedure(s) Performed: Procedure(s) (LRB): LAPAROSCOPIC BILATERAL INGUINAL HERNIA WITH MESH AND UMBILICAL HERNIA REPAIR, 5x4x2 cm scalp mass (Left) ENLARGING SCALP MASS REMOVAL  Patient location during evaluation: PACU Anesthesia Type: General Level of consciousness: awake and alert and oriented Pain management: pain level controlled Vital Signs Assessment: post-procedure vital signs reviewed and stable Respiratory status: spontaneous breathing, nonlabored ventilation and respiratory function stable Cardiovascular status: blood pressure returned to baseline and stable Postop Assessment: no signs of nausea or vomiting Anesthetic complications: no    Last Vitals:  Filed Vitals:   08/28/15 1605 08/28/15 1618  BP: 133/85 125/83  Pulse: 65 62  Temp: 36.7 C 36.8 C  Resp: 18 15    Last Pain:  Filed Vitals:   08/28/15 1619  PainSc: 4                  Ailea Rhatigan A.

## 2015-08-28 NOTE — Op Note (Signed)
08/28/2015  3:20 PM  PATIENT:  Christopher Farrell  55 y.o. male  Patient Care Team: Truman Hayward, MD as PCP - General (Infectious Diseases) Truman Hayward, MD as PCP - Infectious Diseases (Infectious Diseases) Frederik Pear, MD as Consulting Physician (Orthopedic Surgery) Michael Boston, MD as Consulting Physician (General Surgery) Truman Hayward, MD as Consulting Physician (Infectious Diseases)  PRE-OPERATIVE DIAGNOSIS:    Left inguinal scrotal hernia Possible recurrent right inguinal hernia Umbilical hernia Enlarging scalp mass  POST-OPERATIVE DIAGNOSIS:    Left inguinal scrotal hernia with small hydrocele Recurrent right inguinal hernia Umbilical hernia Enlarging scalp mass  PROCEDURE:    LAPAROSCOPIC BILATERAL INGUINAL HERNIA REPAIR WITH MESH  Drainage & CLOSURE of left hydrocele PRIMARY UMBILICAL HERNIA REPAIR,  EXCISION OF SCALP MASS  SURGEON:  Surgeon(s): Michael Boston, MD  ASSISTANT: RN   ANESTHESIA:   Regional ilioinguinal and genitofemoral and spermatic cord nerve blocks with GETA  EBL:  Total I/O In: -  Out: 15 [Blood:50]  Delay start of Pharmacological VTE agent (>24hrs) due to surgical blood loss or risk of bleeding:  no  DRAINS: NONE  SPECIMEN:  SCALP MASS  DISPOSITION OF SPECIMEN:  N/A  COUNTS:  YES  PLAN OF CARE: Discharge to home after PACU  PATIENT DISPOSITION:  PACU - hemodynamically stable.  INDICATION: Patient with enlarging hernia down to the left scrotum.  Possible right along hernias well.  Small umbilical hernia.  I recommended hernia repair.  Left endoscopic process mesh.  Patient with a large mass left anterior scalp.  I offered removal as well.  The anatomy & physiology of the abdominal wall and pelvic floor was discussed.  The pathophysiology of hernias in the inguinal and pelvic region was discussed.  Natural history risks such as progressive enlargement, pain, incarceration & strangulation was discussed.    Contributors to complications such as smoking, obesity, diabetes, prior surgery, etc were discussed.    I feel the risks of no intervention will lead to serious problems that outweigh the operative risks; therefore, I recommended surgery to reduce and repair the hernia.  I explained laparoscopic techniques with possible need for an open approach.  I noted usual use of mesh to patch and/or buttress hernia repair  Risks such as bleeding, infection, abscess, need for further treatment, heart attack, death, and other risks were discussed.  I noted a good likelihood this will help address the problem.   Goals of post-operative recovery were discussed as well.  Possibility that this will not correct all symptoms was explained.  I stressed the importance of low-impact activity, aggressive pain control, avoiding constipation, & not pushing through pain to minimize risk of post-operative chronic pain or injury. Possibility of reherniation was discussed.  We will work to minimize complications.     An educational handout further explaining the pathology & treatment options was given as well.  Questions were answered.  The patient expresses understanding & wishes to proceed with surgery.  OR FINDINGS: Large indirect scrotal hernia.  Small indirect writing or hernia.  Small umbilical hernia through stalk.  5 x 4 x 2 cm fatty mass adherent to the scalp on the left frontotemporal region.  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  DESCRIPTION:   The patient was identified & brought into the operating room. The patient was positioned supine with arms tucked. SCDs were active during the entire case. The patient underwent general anesthesia without any difficulty.  The abdomen was prepped and draped in a sterile fashion.  The patient's bladder was emptied.  A Surgical Timeout confirmed our plan.  I made a transverse incision through the inferior umbilical fold.  I made a small transverse nick through the anterior rectus fascia  contralateral to the inguinal hernia side and placed a 0-vicryl stitch through the fascia.  I placed a Hasson trocar into the preperitoneal plane.  Entry was clean.  We induced carbon dioxide insufflation. Camera inspection revealed no injury.  I used a 47mm angled scope to bluntly free the peritoneum off the infraumbilical anterior abdominal wall.  I created enough of a preperitoneal pocket to place 58mm ports into the right & left mid-abdomen into this preperitoneal cavity.  I focused attention on the LEFT side since that was the dominant hernia side.   I used blunt & focused sharp dissection to free the peritoneum off the flank and down to the pubic rim.  I freed the anteriolateral bladder wall off the anteriolateral pelvic wall, sparing midline attachments.   I located a swath of peritoneum going into a hernia fascial defect at the indirect space consistent with an indirect inguinal hernia.  I gradually freed the peritoneal hernia sac off safely and reduced it into the preperitoneal space.  I freed the peritoneum off the spermatic vessels & vas deferens.  It was a giant hernia sac with hydrocele associated with it.  I freed peritoneum off the retroperitoneum along the psoas muscle.    I checked & assured hemostasis.  Proceeded with a high ligation of the sac with 3-0 Vicryl intracorporeal suturing.  I turned attention on the opposite side.  I did dissection in a similar, mirror-image fashion. The patient had a small but definite indirect right inguinal hernia.  Proceeded with a high ligation there as well.      I chose 15x15 cm sheets of ultra-lightweight polypropylene mesh (Ultrapro), one for each side.  I cut a single sigmoid-shaped slit ~6cm from a corner of each mesh.  I placed the meshes into the preperitoneal space & laid them as overlapping diamonds such that at the inferior points, a 6x6 cm corner flap rested in the true anterolateral pelvis, covering the obturator & femoral foramina.   I allowed  the bladder to return to the pubis, this helping tuck the corners of the mesh in the anteriolateral pelvis.  The medial corners overlapped each other across midline cephalad to the pubic rim.   This provided >2 inch coverage around the hernia.  I did lay a third 15X15cm mesh along the center given the giant hernia in the left indirect space for good overlap and mesh reinforcement.  Because the defects well covered and not particularly large, I did not place any tacks.  I held the hernia sacs cephalad & evacuated carbon dioxide.  I freed the umbilical stalk off the anterior fascia to expose the 8 mm umbilical hernia.  I closed that primarily with #1 PDS transverse suture.  I closed the infraumbilical pulmonary port site defect at the fat fascia with absorbable suture.  I closed the skin using 4-0 monocryl stitch.  Sterile dressings were applied.   I then proceeded with excision of left scalp mass.  Left scalp was prepped and draped in a sterile fashion.  I made a gentle oblique incision over the center part of the mass.  Came through the dermis encountered a encapsulated fatty mass.  It was very well vascularized especially medially.  Eventually freed it off the heart scalp and removed it intact.  I assured  hemostasis.  I closed it with 3-0 Vicryl deep dermal sutures and running 4 Monocryl suture.  Steri-Strips are placed.  Gauze and dressing placed.    The patient was extubated & arrived in the PACU in stable condition..   had discussed postoperative care with the patient in the holding area.   I did discuss operative findings and postoperative goals / instructions to the patient's hs mother as well.  Instructions are written in the chart.  [name], M.D., F.A.C.S. Gastrointestinal and Minimally Invasive Surgery [facility] [address] [place], [place] [address] [phone] Main / Paging

## 2015-08-28 NOTE — Transfer of Care (Signed)
Immediate Anesthesia Transfer of Care Note  Patient: Christopher Farrell  Procedure(s) Performed: Procedure(s): LAPAROSCOPIC BILATERAL INGUINAL HERNIA WITH MESH AND UMBILICAL HERNIA REPAIR, 5x4x2 cm scalp mass (Left) ENLARGING SCALP MASS REMOVAL  Patient Location: PACU  Anesthesia Type:General  Level of Consciousness:  sedated, patient cooperative and responds to stimulation  Airway & Oxygen Therapy:Patient Spontanous Breathing and Patient connected to face mask oxgen  Post-op Assessment:  Report given to PACU RN and Post -op Vital signs reviewed and stable  Post vital signs:  Reviewed and stable  Last Vitals:  Filed Vitals:   08/28/15 1102 08/28/15 1524  BP: 112/76   Pulse: 68 68  Temp: 36.7 C   Resp: 16 11    Complications: No apparent anesthesia complications

## 2015-08-31 ENCOUNTER — Encounter (HOSPITAL_COMMUNITY): Payer: Self-pay | Admitting: Surgery

## 2015-08-31 NOTE — Progress Notes (Signed)
Thanks Steven.

## 2015-09-14 ENCOUNTER — Encounter: Payer: Self-pay | Admitting: Infectious Disease

## 2015-09-14 ENCOUNTER — Ambulatory Visit (INDEPENDENT_AMBULATORY_CARE_PROVIDER_SITE_OTHER): Payer: BLUE CROSS/BLUE SHIELD | Admitting: Infectious Disease

## 2015-09-14 VITALS — BP 130/89 | HR 81 | Temp 98.4°F | Ht 64.0 in | Wt 133.0 lb

## 2015-09-14 DIAGNOSIS — K4091 Unilateral inguinal hernia, without obstruction or gangrene, recurrent: Secondary | ICD-10-CM

## 2015-09-14 DIAGNOSIS — R220 Localized swelling, mass and lump, head: Secondary | ICD-10-CM

## 2015-09-14 DIAGNOSIS — B20 Human immunodeficiency virus [HIV] disease: Secondary | ICD-10-CM | POA: Diagnosis not present

## 2015-09-14 DIAGNOSIS — F17200 Nicotine dependence, unspecified, uncomplicated: Secondary | ICD-10-CM

## 2015-09-14 DIAGNOSIS — R229 Localized swelling, mass and lump, unspecified: Secondary | ICD-10-CM

## 2015-09-14 DIAGNOSIS — A5149 Other secondary syphilitic conditions: Secondary | ICD-10-CM
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
## 2016-01-27 ENCOUNTER — Other Ambulatory Visit: Payer: Self-pay | Admitting: Infectious Disease

## 2016-01-27 DIAGNOSIS — B20 Human immunodeficiency virus [HIV] disease: Secondary | ICD-10-CM

## 2016-01-27 NOTE — Telephone Encounter (Signed)
Patient needs to make 6 month follow-up appt for labs/MD.

## 2016-02-19 ENCOUNTER — Other Ambulatory Visit: Payer: Self-pay | Admitting: *Deleted

## 2016-02-19 DIAGNOSIS — B20 Human immunodeficiency virus [HIV] disease: Secondary | ICD-10-CM

## 2016-02-19 MED ORDER — EMTRICITAB-RILPIVIR-TENOFOV AF 200-25-25 MG PO TABS: 1.0000 | ORAL_TABLET | Freq: Every day | ORAL | 1 refills | Status: DC

## 2016-03-04 ENCOUNTER — Telehealth: Payer: Self-pay | Admitting: Pharmacist Clinician (PhC)/ Clinical Pharmacy Specialist

## 2016-03-04 NOTE — Telephone Encounter (Signed)
No idea why Christopher Farrell was a non-formulary. The appeal was faxed.

## 2016-03-08 NOTE — Telephone Encounter (Addendum)
Approved for 12 months, valid 03/03/16 - 03/03/17. Pharmacy notified.Copay is $2500.  Pharmacy will activate copay assistance for the patient, will notify him that me MUST come in for patient assistance (ICAP??) Landis Gandy, RN

## 2016-03-09 ENCOUNTER — Telehealth: Payer: Self-pay | Admitting: Infectious Disease

## 2016-03-09 NOTE — Telephone Encounter (Signed)
Left vm for patient to return my call. Wanted to give patient my contact information so that he can reach me if he has any issues with his copay/deductible on his odefsey medication. Also wanted to have patient call the front desk and schedule an appointment to see Dr. Tommy Medal.

## 2016-03-14 ENCOUNTER — Ambulatory Visit: Payer: BLUE CROSS/BLUE SHIELD

## 2016-03-14 ENCOUNTER — Other Ambulatory Visit: Payer: BLUE CROSS/BLUE SHIELD

## 2016-03-14 ENCOUNTER — Other Ambulatory Visit (HOSPITAL_COMMUNITY)
Admission: RE | Admit: 2016-03-14 | Discharge: 2016-03-14 | Disposition: A | Discharge status: Home / Self Care | Payer: BLUE CROSS/BLUE SHIELD | Source: Ambulatory Visit | Attending: Infectious Disease | Admitting: Infectious Disease

## 2016-03-14 DIAGNOSIS — Z113 Encounter for screening for infections with a predominantly sexual mode of transmission: Secondary | ICD-10-CM | POA: Insufficient documentation

## 2016-03-14 DIAGNOSIS — B20 Human immunodeficiency virus [HIV] disease: Secondary | ICD-10-CM

## 2016-03-14 LAB — CBC WITH DIFFERENTIAL/PLATELET
Basophils Absolute: 0 cells/uL (ref 0–200)
Basophils Relative: 0 %
Eosinophils Absolute: 137 cells/uL (ref 15–500)
Eosinophils Relative: 1 %
HEMATOCRIT: 38.2 % — AB (ref 38.5–50.0)
Hemoglobin: 12.7 g/dL — ABNORMAL LOW (ref 13.2–17.1)
LYMPHS PCT: 18 %
Lymphs Abs: 2466 cells/uL (ref 850–3900)
MCH: 30.7 pg (ref 27.0–33.0)
MCHC: 33.2 g/dL (ref 32.0–36.0)
MCV: 92.3 fL (ref 80.0–100.0)
MONO ABS: 959 {cells}/uL — AB (ref 200–950)
MONOS PCT: 7 %
MPV: 8.7 fL (ref 7.5–12.5)
Neutro Abs: 10138 cells/uL — ABNORMAL HIGH (ref 1500–7800)
Neutrophils Relative %: 74 %
PLATELETS: 221 10*3/uL (ref 140–400)
RBC: 4.14 MIL/uL — AB (ref 4.20–5.80)
RDW: 14.7 % (ref 11.0–15.0)
WBC: 13.7 10*3/uL — AB (ref 3.8–10.8)

## 2016-03-14 LAB — COMPLETE METABOLIC PANEL WITH GFR
ALT: 12 U/L (ref 9–46)
AST: 17 U/L (ref 10–35)
Albumin: 4.1 g/dL (ref 3.6–5.1)
Alkaline Phosphatase: 43 U/L (ref 40–115)
BUN: 12 mg/dL (ref 7–25)
CALCIUM: 8.9 mg/dL (ref 8.6–10.3)
CHLORIDE: 105 mmol/L (ref 98–110)
CO2: 26 mmol/L (ref 20–31)
Creat: 0.78 mg/dL (ref 0.70–1.33)
GFR, Est Non African American: >89 mL/min (ref >=60)
Glucose, Bld: 89 mg/dL (ref 65–99)
POTASSIUM: 3.7 mmol/L (ref 3.5–5.3)
Sodium: 141 mmol/L (ref 135–146)
Total Bilirubin: 0.6 mg/dL (ref 0.2–1.2)
Total Protein: 6.5 g/dL (ref 6.1–8.1)

## 2016-03-15 LAB — HEPATITIS C ANTIBODY (REFLEX)

## 2016-03-15 LAB — RPR: RPR: REACTIVE — AB

## 2016-03-15 LAB — FLUORESCENT TREPONEMAL AB(FTA)-IGG-BLD: Fluorescent Treponemal ABS: REACTIVE — AB

## 2016-03-15 LAB — RPR TITER

## 2016-03-15 LAB — HCV COMMENT:

## 2016-03-15 LAB — HEPATITIS C ANTIBODY: HCV AB: NEGATIVE

## 2016-03-16 LAB — URINE CYTOLOGY ANCILLARY ONLY
Chlamydia: NEGATIVE
Neisseria Gonorrhea: NEGATIVE

## 2016-03-16 LAB — HIV-1 RNA,QN PCR W/REFLEX GENOTYPE
HIV-1 RNA, QN PCR: 1.37 {Log_copies}/mL — AB (ref <1.30)
HIV-1 RNA, QN PCR: 23 {copies}/mL — AB (ref <20)

## 2016-03-24 ENCOUNTER — Ambulatory Visit: Payer: BLUE CROSS/BLUE SHIELD | Admitting: Infectious Disease

## 2016-05-11 ENCOUNTER — Other Ambulatory Visit: Payer: Self-pay | Admitting: Licensed Clinical Social Worker

## 2016-05-11 ENCOUNTER — Ambulatory Visit: Payer: BLUE CROSS/BLUE SHIELD

## 2016-05-11 DIAGNOSIS — B20 Human immunodeficiency virus [HIV] disease: Secondary | ICD-10-CM

## 2016-06-08 ENCOUNTER — Other Ambulatory Visit: Payer: Self-pay | Admitting: Infectious Disease

## 2016-06-08 DIAGNOSIS — B20 Human immunodeficiency virus [HIV] disease: Secondary | ICD-10-CM

## 2016-06-28 ENCOUNTER — Ambulatory Visit (INDEPENDENT_AMBULATORY_CARE_PROVIDER_SITE_OTHER): Payer: BLUE CROSS/BLUE SHIELD | Admitting: Infectious Disease

## 2016-06-28 ENCOUNTER — Encounter: Payer: Self-pay | Admitting: Infectious Disease

## 2016-06-28 VITALS — BP 103/66 | HR 102 | Temp 98.3°F | Wt 132.0 lb

## 2016-06-28 DIAGNOSIS — Z113 Encounter for screening for infections with a predominantly sexual mode of transmission: Secondary | ICD-10-CM | POA: Diagnosis not present

## 2016-06-28 DIAGNOSIS — Z79899 Other long term (current) drug therapy: Secondary | ICD-10-CM

## 2016-06-28 DIAGNOSIS — B20 Human immunodeficiency virus [HIV] disease: Secondary | ICD-10-CM

## 2016-06-28 LAB — CBC WITH DIFFERENTIAL/PLATELET
Basophils Absolute: 86 cells/uL (ref 0–200)
Basophils Relative: 1 %
EOS PCT: 1 %
Eosinophils Absolute: 86 cells/uL (ref 15–500)
HEMATOCRIT: 42.2 % (ref 38.5–50.0)
Hemoglobin: 14 g/dL (ref 13.2–17.1)
LYMPHS ABS: 3698 {cells}/uL (ref 850–3900)
Lymphocytes Relative: 43 %
MCH: 31 pg (ref 27.0–33.0)
MCHC: 33.2 g/dL (ref 32.0–36.0)
MCV: 93.4 fL (ref 80.0–100.0)
MPV: 8.8 fL (ref 7.5–12.5)
Monocytes Absolute: 344 cells/uL (ref 200–950)
Monocytes Relative: 4 %
NEUTROS ABS: 4386 {cells}/uL (ref 1500–7800)
Neutrophils Relative %: 51 %
Platelets: 232 10*3/uL (ref 140–400)
RBC: 4.52 MIL/uL (ref 4.20–5.80)
RDW: 14 % (ref 11.0–15.0)
WBC: 8.6 10*3/uL (ref 3.8–10.8)
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
## 2016-06-29 LAB — COMPLETE METABOLIC PANEL WITH GFR
AG RATIO: 1.9 ratio (ref 1.0–2.5)
ALK PHOS: 42 U/L (ref 40–115)
ALT: 17 U/L (ref 9–46)
AST: 18 U/L (ref 10–35)
Albumin: 4.1 g/dL (ref 3.6–5.1)
BUN / CREAT RATIO: 14.3 ratio (ref 6–22)
BUN: 14 mg/dL (ref 7–25)
CHLORIDE: 104 mmol/L (ref 98–110)
CO2: 27 mmol/L (ref 20–31)
Calcium: 9 mg/dL (ref 8.6–10.3)
Creat: 0.98 mg/dL (ref 0.70–1.33)
GFR, EST NON AFRICAN AMERICAN: 86 mL/min (ref >=60)
GFR, Est African American: >89 mL/min (ref >=60)
Globulin: 2.2 g/dL (ref 1.9–3.7)
Glucose, Bld: 119 mg/dL — ABNORMAL HIGH (ref 65–99)
POTASSIUM: 4.3 mmol/L (ref 3.5–5.3)
SODIUM: 140 mmol/L (ref 135–146)
Total Bilirubin: 0.5 mg/dL (ref 0.2–1.2)
Total Protein: 6.3 g/dL (ref 6.1–8.1)

## 2016-06-29 LAB — LIPID PANEL
CHOLESTEROL: 115 mg/dL (ref <200)
HDL: 79 mg/dL (ref >40)
LDL CALC: 27 mg/dL (ref <100)
TRIGLYCERIDES: 44 mg/dL (ref <150)
Total CHOL/HDL Ratio: 1.5 Ratio (ref <5.0)
VLDL: 9 mg/dL (ref <30)

## 2016-06-29 LAB — FLUORESCENT TREPONEMAL AB(FTA)-IGG-BLD: FLUORESCENT TREPONEMAL ABS: REACTIVE — AB

## 2016-06-29 LAB — RPR TITER

## 2016-06-29 LAB — RPR: RPR Ser Ql: REACTIVE — AB

## 2016-06-30 LAB — HIV-1 RNA QUANT-NO REFLEX-BLD
HIV 1 RNA QUANT: 65 {copies}/mL — AB
HIV-1 RNA QUANT, LOG: 1.81 {Log_copies}/mL — AB

## 2016-06-30 LAB — T-HELPER CELL (CD4) - (RCID CLINIC ONLY)
CD4 % Helper T Cell: 41 % (ref 33–55)
CD4 T Cell Abs: 1560 /uL (ref 400–2700)

## 2016-07-12 ENCOUNTER — Other Ambulatory Visit: Payer: Self-pay | Admitting: Infectious Disease

## 2016-07-12 DIAGNOSIS — B20 Human immunodeficiency virus [HIV] disease: Secondary | ICD-10-CM

## 2016-08-16 ENCOUNTER — Other Ambulatory Visit: Payer: Self-pay | Admitting: Infectious Disease

## 2016-08-16 DIAGNOSIS — B20 Human immunodeficiency virus [HIV] disease: Secondary | ICD-10-CM

## 2016-11-16 ENCOUNTER — Ambulatory Visit (INDEPENDENT_AMBULATORY_CARE_PROVIDER_SITE_OTHER): Payer: Self-pay | Admitting: Infectious Disease

## 2016-11-16 ENCOUNTER — Encounter: Payer: Self-pay | Admitting: Infectious Disease

## 2016-11-16 VITALS — BP 117/70 | HR 73 | Temp 97.4°F | Wt 134.0 lb

## 2016-11-16 DIAGNOSIS — Z5989 Other problems related to housing and economic circumstances: Secondary | ICD-10-CM

## 2016-11-16 DIAGNOSIS — Z23 Encounter for immunization: Secondary | ICD-10-CM

## 2016-11-16 DIAGNOSIS — Z653 Problems related to other legal circumstances: Secondary | ICD-10-CM

## 2016-11-16 DIAGNOSIS — Z91199 Patient's noncompliance with other medical treatment and regimen due to unspecified reason: Secondary | ICD-10-CM

## 2016-11-16 DIAGNOSIS — Z598 Other problems related to housing and economic circumstances: Secondary | ICD-10-CM

## 2016-11-16 DIAGNOSIS — Z9119 Patient's noncompliance with other medical treatment and regimen: Secondary | ICD-10-CM

## 2016-11-16 DIAGNOSIS — B20 Human immunodeficiency virus [HIV] disease: Secondary | ICD-10-CM

## 2016-11-16 MED ORDER — DARUN-COBIC-EMTRICIT-TENOFAF 800-150-200-10 MG PO TABS: 1.0000 | ORAL_TABLET | Freq: Every day | ORAL | 11 refills | Status: DC

## 2016-11-16 MED ORDER — DARUN-COBIC-EMTRICIT-TENOFAF 800-150-200-10 MG PO TABS: 1.0000 | ORAL_TABLET | Freq: Every day | ORAL | 0 refills | Status: DC

## 2016-11-16 MED FILL — SYMTUZA 800-150-200-10 MG T: 800-150-200 | 30 days supply | Qty: 30 | Fill #0
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
## 2016-11-16 NOTE — Patient Instructions (Signed)
We will ENROLL you into [facility] as well as ADAP  You should STOP the ODEFSEY immediately  I want you to START SYMTUZA one tablet daily  I want you to come back in next 2 weeks and meet with ID pharmacy to make sure you are tolerating your medications  We will recheck blood work at that appt  I want to see you back in 6 weeks

## 2016-11-17 ENCOUNTER — Encounter: Payer: Self-pay | Admitting: Infectious Disease

## 2016-11-17 LAB — COMPLETE METABOLIC PANEL WITH GFR
AG Ratio: 1.6 (calc) (ref 1.0–2.5)
ALT: 14 U/L (ref 9–46)
AST: 18 U/L (ref 10–35)
Albumin: 3.9 g/dL (ref 3.6–5.1)
Alkaline phosphatase (APISO): 47 U/L (ref 40–115)
BUN: 15 mg/dL (ref 7–25)
CALCIUM: 8.7 mg/dL (ref 8.6–10.3)
CO2: 29 mmol/L (ref 20–32)
CREATININE: 0.84 mg/dL (ref 0.70–1.33)
Chloride: 105 mmol/L (ref 98–110)
GFR, EST NON AFRICAN AMERICAN: 98 mL/min/{1.73_m2} (ref >=60)
GFR, Est African American: 113 mL/min/{1.73_m2} (ref >=60)
Globulin: 2.4 g/dL (calc) (ref 1.9–3.7)
Glucose, Bld: 102 mg/dL — ABNORMAL HIGH (ref 65–99)
Potassium: 4.2 mmol/L (ref 3.5–5.3)
Sodium: 142 mmol/L (ref 135–146)
TOTAL PROTEIN: 6.3 g/dL (ref 6.1–8.1)
Total Bilirubin: 0.3 mg/dL (ref 0.2–1.2)

## 2016-11-17 LAB — T-HELPER CELL (CD4) - (RCID CLINIC ONLY)
CD4 % Helper T Cell: 40 % (ref 33–55)
CD4 T CELL ABS: 1090 /uL (ref 400–2700)

## 2016-11-18 LAB — HIV-1 RNA ULTRAQUANT REFLEX TO GENTYP+: HIV 1 RNA Quant: <20 Copies/mL — ABNORMAL HIGH

## 2016-12-07 IMAGING — US US ABDOMEN COMPLETE
1 series · 14 of 25 positions shown · non-contrast
Comparison: None.

CLINICAL DATA: Epigastric pain for 4 days.

EXAM:
ULTRASOUND ABDOMEN COMPLETE

[Series 1: us abdomen complete · 0.14mm/px · 14 of 55 slices shown]
[im 1/55]
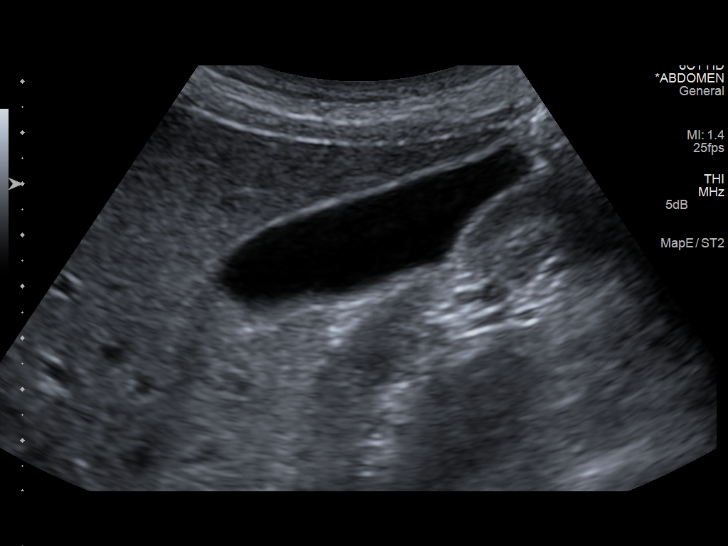
[im 5/55]
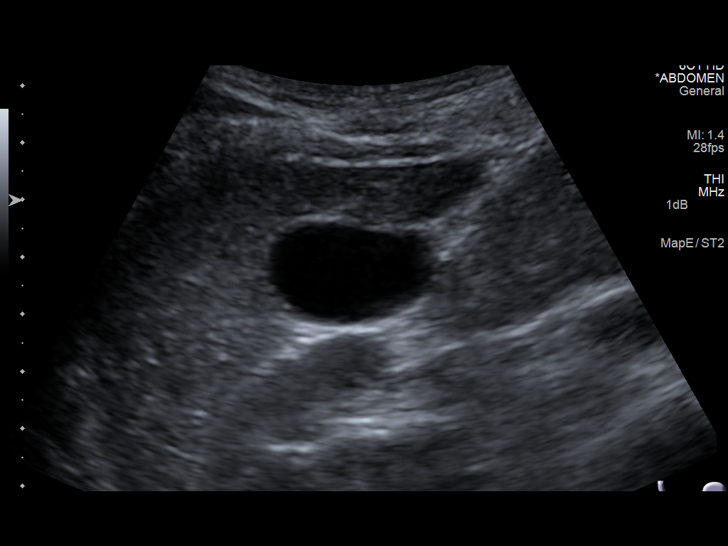
[im 10/55]
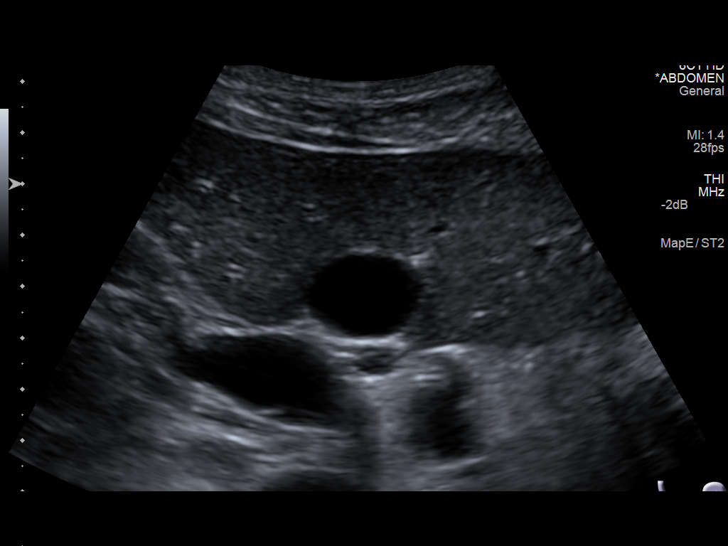
[im 14/55]
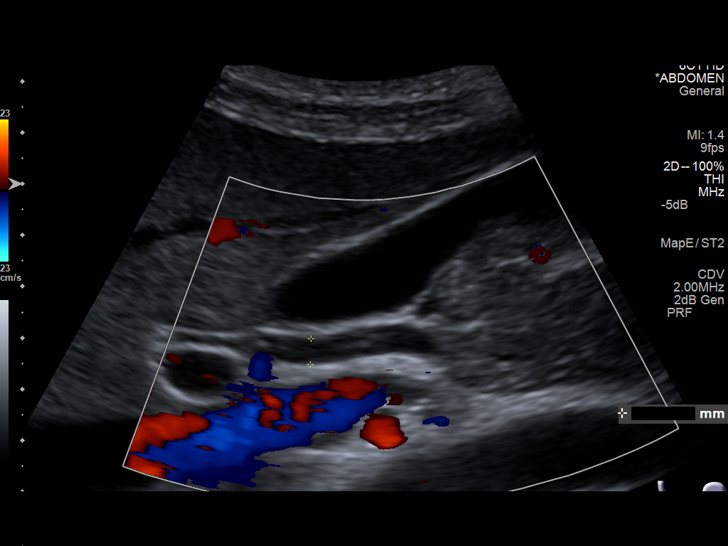
[im 19/55]
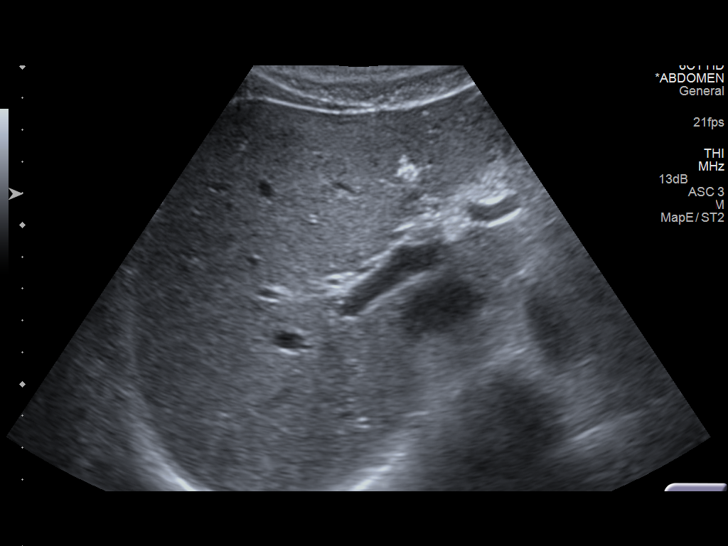
[im 21/55]
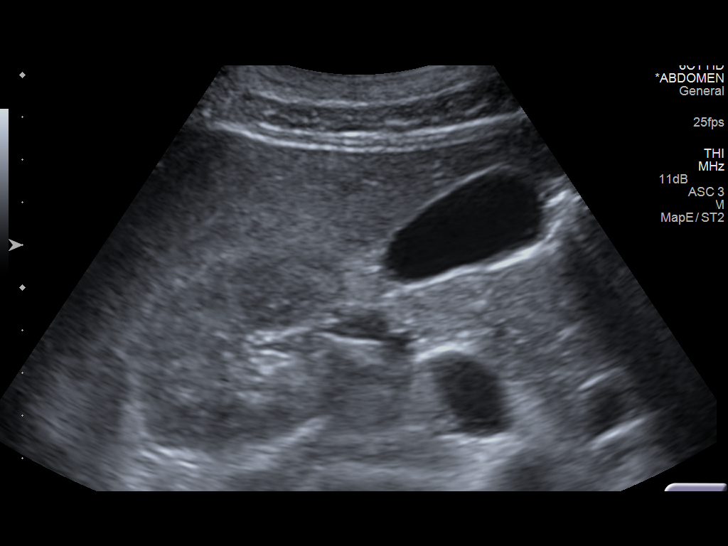
[im 25/55]
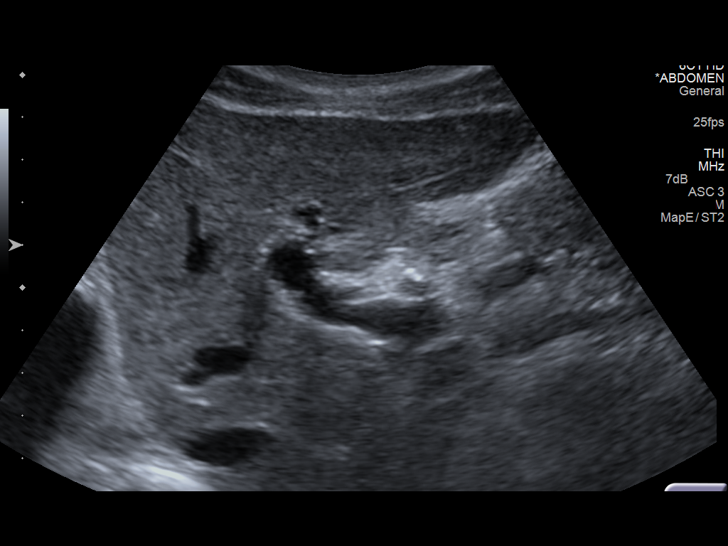
[im 30/55]
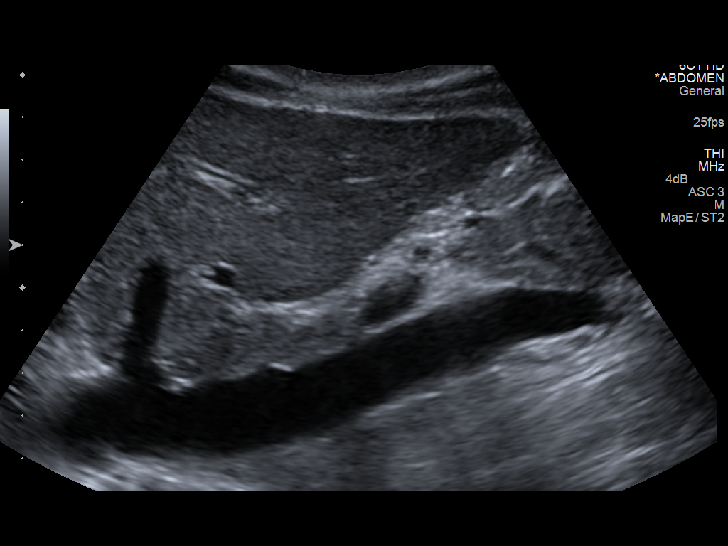
[im 34/55]
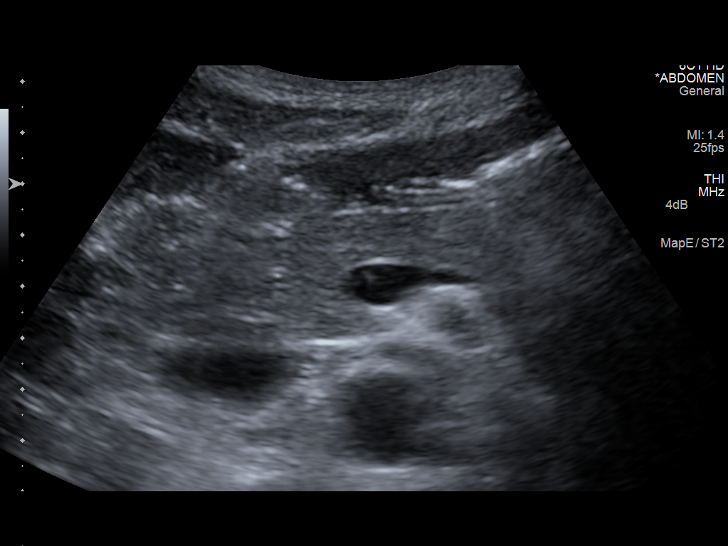
[im 37/55]
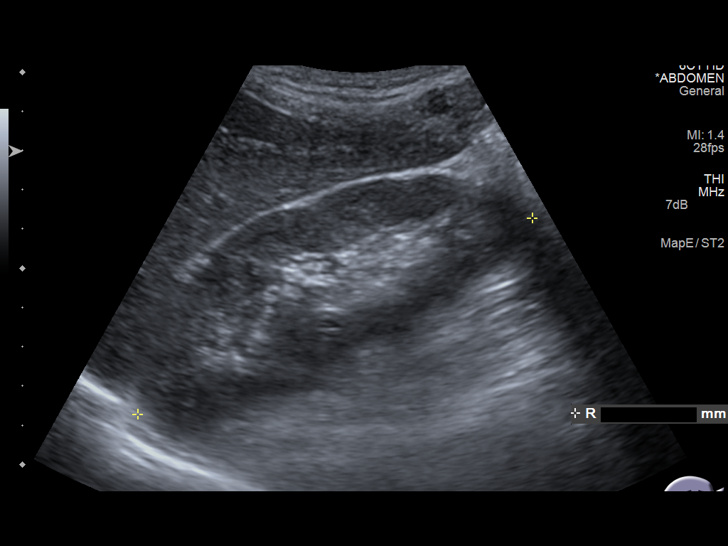
[im 41/55]
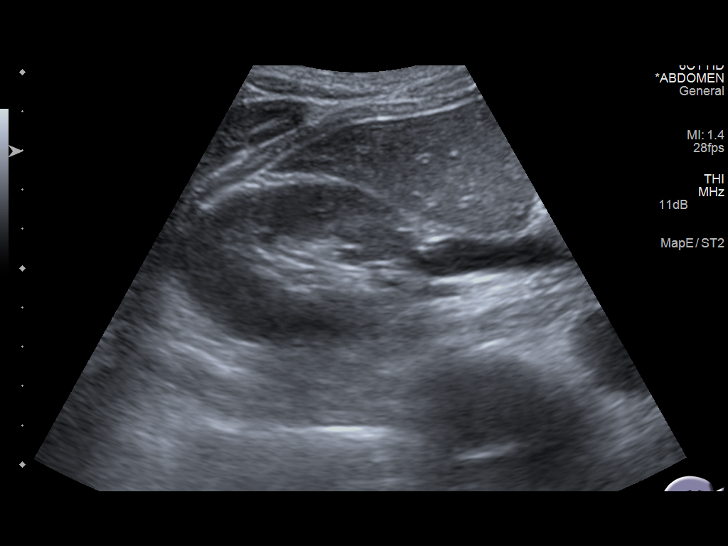
[im 46/55]
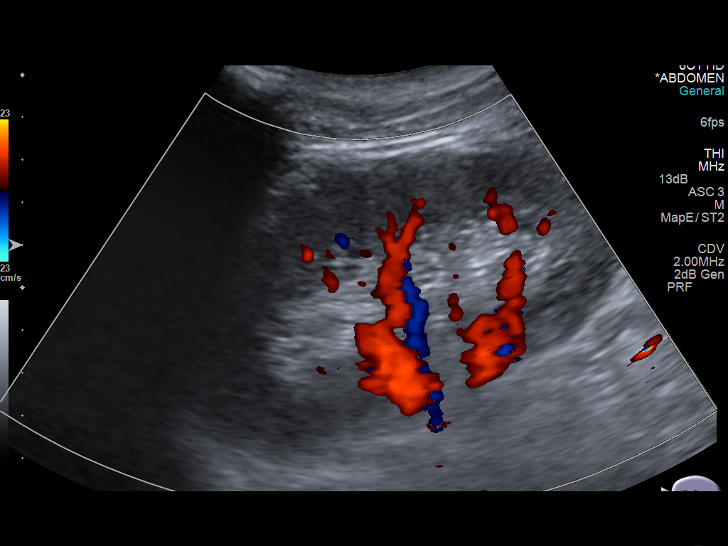
[im 50/55]
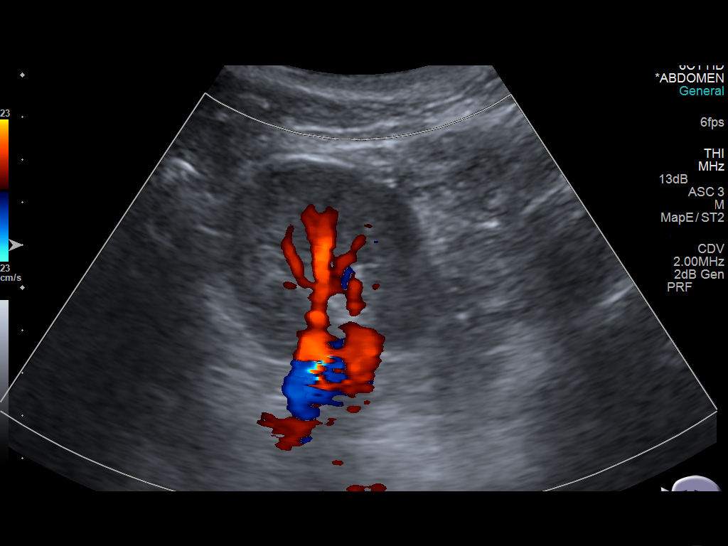
[im 55/55]
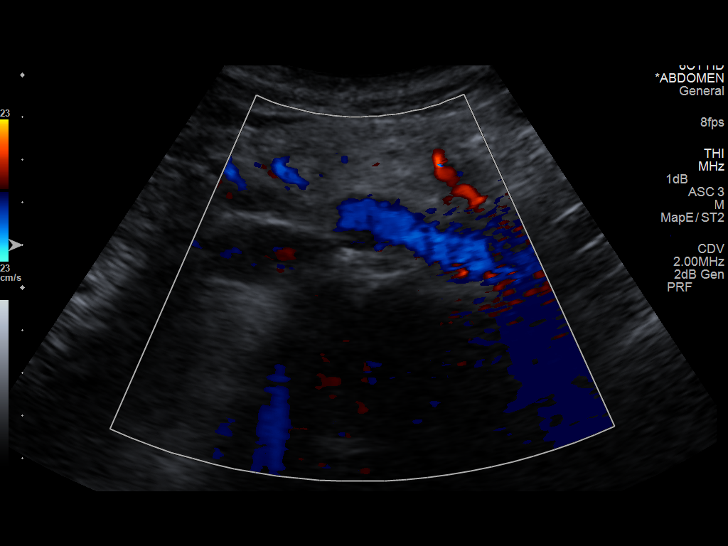

[14 of 25 positions shown; findings below may reference images not displayed]

FINDINGS: Gallbladder: No gallstones or wall thickening visualized. No
sonographic Murphy sign noted.

Common bile duct: Diameter: 5 mm

Liver: No focal lesion identified. Within normal limits in
parenchymal echogenicity.

IVC: No abnormality visualized.

Pancreas: Portions of the head and tail were obscured. Visualized
portion unremarkable.

Spleen: Size and appearance within normal limits.

Right Kidney: Length: 11.2 cm. Echogenicity within normal limits. No
mass or hydronephrosis visualized.

Left Kidney: Length: 11.1 cm. Echogenicity within normal limits. No
mass or hydronephrosis visualized.

Abdominal aorta: No aneurysm visualized.

Other findings: None.
IMPRESSION: Negative abdominal ultrasound.

## 2016-12-19 ENCOUNTER — Other Ambulatory Visit: Payer: Self-pay | Admitting: Pharmacist

## 2016-12-19 ENCOUNTER — Other Ambulatory Visit: Payer: Self-pay | Admitting: Infectious Disease

## 2016-12-19 ENCOUNTER — Other Ambulatory Visit: Payer: Self-pay | Admitting: Pharmacist Clinician (PhC)/ Clinical Pharmacy Specialist

## 2016-12-19 MED ORDER — EMTRICITABINE-TENOFOVIR AF 200-25 MG PO TABS: 1.0000 | ORAL_TABLET | Freq: Every day | ORAL | 6 refills | Status: DC

## 2016-12-19 MED ORDER — DARUNAVIR-COBICISTAT 800-150 MG PO TABS: 1.0000 | ORAL_TABLET | Freq: Every day | ORAL | 6 refills | Status: DC

## 2016-12-19 NOTE — Progress Notes (Signed)
Will change Symtuza to Prezcobix/Descovy due to ADAP.

## 2017-03-13 ENCOUNTER — Other Ambulatory Visit: Payer: Self-pay

## 2017-03-13 DIAGNOSIS — B20 Human immunodeficiency virus [HIV] disease: Secondary | ICD-10-CM

## 2017-03-14 LAB — COMPLETE METABOLIC PANEL WITH GFR
AG Ratio: 1.9 (calc) (ref 1.0–2.5)
ALBUMIN MSPROF: 4.4 g/dL (ref 3.6–5.1)
ALT: 11 U/L (ref 9–46)
AST: 18 U/L (ref 10–35)
Alkaline phosphatase (APISO): 45 U/L (ref 40–115)
BUN: 18 mg/dL (ref 7–25)
CO2: 28 mmol/L (ref 20–32)
CREATININE: 0.95 mg/dL (ref 0.70–1.33)
Calcium: 9.1 mg/dL (ref 8.6–10.3)
Chloride: 105 mmol/L (ref 98–110)
GFR, EST AFRICAN AMERICAN: 103 mL/min/{1.73_m2} (ref >=60)
GFR, Est Non African American: 89 mL/min/{1.73_m2} (ref >=60)
GLUCOSE: 104 mg/dL — AB (ref 65–99)
Globulin: 2.3 g/dL (calc) (ref 1.9–3.7)
Potassium: 4.4 mmol/L (ref 3.5–5.3)
Sodium: 139 mmol/L (ref 135–146)
Total Bilirubin: 0.6 mg/dL (ref 0.2–1.2)
Total Protein: 6.7 g/dL (ref 6.1–8.1)

## 2017-03-14 LAB — CBC WITH DIFFERENTIAL/PLATELET
BASOS ABS: 39 {cells}/uL (ref 0–200)
Basophils Relative: 0.6 %
Eosinophils Absolute: 202 cells/uL (ref 15–500)
Eosinophils Relative: 3.1 %
HEMATOCRIT: 39.6 % (ref 38.5–50.0)
HEMOGLOBIN: 13.5 g/dL (ref 13.2–17.1)
LYMPHS ABS: 2659 {cells}/uL (ref 850–3900)
MCH: 31.5 pg (ref 27.0–33.0)
MCHC: 34.1 g/dL (ref 32.0–36.0)
MCV: 92.3 fL (ref 80.0–100.0)
MPV: 9.4 fL (ref 7.5–12.5)
Monocytes Relative: 7.1 %
NEUTROS PCT: 48.3 %
Neutro Abs: 3140 cells/uL (ref 1500–7800)
Platelets: 218 10*3/uL (ref 140–400)
RBC: 4.29 10*6/uL (ref 4.20–5.80)
RDW: 13.3 % (ref 11.0–15.0)
Total Lymphocyte: 40.9 %
WBC: 6.5 10*3/uL (ref 3.8–10.8)
WBCMIX: 462 {cells}/uL (ref 200–950)

## 2017-03-14 LAB — T-HELPER CELL (CD4) - (RCID CLINIC ONLY)
CD4 T CELL HELPER: 37 % (ref 33–55)
CD4 T Cell Abs: 1100 /uL (ref 400–2700)

## 2017-03-14 LAB — FLUORESCENT TREPONEMAL AB(FTA)-IGG-BLD: FLUORESCENT TREPONEMAL ABS: REACTIVE — AB

## 2017-03-14 LAB — RPR: RPR: REACTIVE — AB

## 2017-03-14 LAB — RPR TITER

## 2017-03-18 LAB — HIV RNA, RTPCR W/R GT (RTI, PI,INT)
HIV 1 RNA QUANT: 32 {copies}/mL — AB
HIV-1 RNA QUANT, LOG: 1.51 {Log_copies}/mL — AB

## 2017-03-27 ENCOUNTER — Encounter: Payer: Self-pay | Admitting: Infectious Disease

## 2017-03-27 ENCOUNTER — Ambulatory Visit (INDEPENDENT_AMBULATORY_CARE_PROVIDER_SITE_OTHER): Payer: Self-pay | Admitting: Infectious Disease

## 2017-03-27 VITALS — BP 136/89 | HR 65 | Temp 98.0°F | Ht 63.0 in | Wt 129.0 lb

## 2017-03-27 DIAGNOSIS — B20 Human immunodeficiency virus [HIV] disease: Secondary | ICD-10-CM

## 2017-03-27 DIAGNOSIS — L270 Generalized skin eruption due to drugs and medicaments taken internally: Secondary | ICD-10-CM | POA: Insufficient documentation

## 2017-03-27 DIAGNOSIS — R21 Rash and other nonspecific skin eruption: Secondary | ICD-10-CM

## 2017-03-27 HISTORY — DX: Rash and other nonspecific skin eruption: R21

## 2017-03-27 MED ORDER — BICTEGRAVIR-EMTRICITAB-TENOFOV 50-200-25 MG PO TABS
1.0000 | ORAL_TABLET | Freq: Every day | ORAL | 11 refills | Status: DC
Start: 2017-03-27 — End: 2018-02-21

## 2017-03-27 MED ORDER — TRIAMCINOLONE ACETONIDE 0.5 % EX OINT: 1.0000 "application " | TOPICAL_OINTMENT | Freq: Two times a day (BID) | CUTANEOUS | 0 refills | Status: DC
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
## 2017-03-27 NOTE — Patient Instructions (Signed)
Christopher Farrell I am going to change you from Prezcobix (BIg pink pill) and Descovy (little blue pill)  To   BIKTARVY ONE PILL ONCE DAILY  I want you to come back in 2-4 weeks and meet with ID pharmacy and bring your medication with you   We will then recheck blood work   Make appt to see me in 6 weeks  I am also giving you a steroid cream you can try on the rash

## 2017-04-10 ENCOUNTER — Ambulatory Visit (INDEPENDENT_AMBULATORY_CARE_PROVIDER_SITE_OTHER): Payer: Self-pay | Admitting: Pharmacist Clinician (PhC)/ Clinical Pharmacy Specialist

## 2017-04-10 DIAGNOSIS — B20 Human immunodeficiency virus [HIV] disease: Secondary | ICD-10-CM

## 2017-04-10 LAB — BASIC METABOLIC PANEL
BUN: 20 mg/dL (ref 7–25)
CALCIUM: 9 mg/dL (ref 8.6–10.3)
CO2: 29 mmol/L (ref 20–32)
Chloride: 107 mmol/L (ref 98–110)
Creat: 1.04 mg/dL (ref 0.70–1.33)
GLUCOSE: 115 mg/dL — AB (ref 65–99)
Potassium: 3.8 mmol/L (ref 3.5–5.3)
Sodium: 142 mmol/L (ref 135–146)
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
## 2017-04-12 LAB — HIV-1 RNA QUANT-NO REFLEX-BLD
HIV 1 RNA QUANT: 21 {copies}/mL — AB
HIV-1 RNA QUANT, LOG: 1.32 {Log_copies}/mL — AB

## 2017-05-01 ENCOUNTER — Other Ambulatory Visit: Payer: Self-pay

## 2017-05-15 ENCOUNTER — Ambulatory Visit: Payer: Self-pay | Admitting: Infectious Disease

## 2017-12-04 ENCOUNTER — Other Ambulatory Visit: Payer: Self-pay | Admitting: Infectious Disease

## 2017-12-14 ENCOUNTER — Encounter: Payer: Self-pay | Admitting: Infectious Disease

## 2017-12-14 ENCOUNTER — Other Ambulatory Visit: Payer: Self-pay

## 2017-12-14 DIAGNOSIS — R21 Rash and other nonspecific skin eruption: Secondary | ICD-10-CM

## 2017-12-14 DIAGNOSIS — B20 Human immunodeficiency virus [HIV] disease: Secondary | ICD-10-CM

## 2017-12-15 LAB — URINE CYTOLOGY ANCILLARY ONLY
CHLAMYDIA, DNA PROBE: NEGATIVE
Neisseria Gonorrhea: NEGATIVE

## 2017-12-15 LAB — T-HELPER CELL (CD4) - (RCID CLINIC ONLY)
CD4 T CELL HELPER: 44 % (ref 33–55)
CD4 T Cell Abs: 830 /uL (ref 400–2700)

## 2017-12-18 LAB — COMPLETE METABOLIC PANEL WITH GFR
AG Ratio: 2 (calc) (ref 1.0–2.5)
ALBUMIN MSPROF: 4.3 g/dL (ref 3.6–5.1)
ALT: 17 U/L (ref 9–46)
AST: 19 U/L (ref 10–35)
Alkaline phosphatase (APISO): 46 U/L (ref 40–115)
BUN: 16 mg/dL (ref 7–25)
CALCIUM: 9.4 mg/dL (ref 8.6–10.3)
CO2: 32 mmol/L (ref 20–32)
CREATININE: 1.04 mg/dL (ref 0.70–1.33)
Chloride: 106 mmol/L (ref 98–110)
GFR, EST NON AFRICAN AMERICAN: 79 mL/min/{1.73_m2} (ref >=60)
GFR, Est African American: 92 mL/min/{1.73_m2} (ref >=60)
Globulin: 2.2 g/dL (calc) (ref 1.9–3.7)
Glucose, Bld: 120 mg/dL — ABNORMAL HIGH (ref 65–99)
Potassium: 4.8 mmol/L (ref 3.5–5.3)
Sodium: 142 mmol/L (ref 135–146)
Total Bilirubin: 0.4 mg/dL (ref 0.2–1.2)
Total Protein: 6.5 g/dL (ref 6.1–8.1)

## 2017-12-18 LAB — HIV-1 RNA QUANT-NO REFLEX-BLD
HIV 1 RNA Quant: <20 copies/mL
HIV-1 RNA QUANT, LOG: NOT DETECTED {Log_copies}/mL

## 2017-12-18 LAB — CBC WITH DIFFERENTIAL/PLATELET
BASOS PCT: 0.7 %
Basophils Absolute: 48 cells/uL (ref 0–200)
EOS ABS: 82 {cells}/uL (ref 15–500)
Eosinophils Relative: 1.2 %
HEMATOCRIT: 38.9 % (ref 38.5–50.0)
Hemoglobin: 13.1 g/dL — ABNORMAL LOW (ref 13.2–17.1)
LYMPHS ABS: 1877 {cells}/uL (ref 850–3900)
MCH: 31.4 pg (ref 27.0–33.0)
MCHC: 33.7 g/dL (ref 32.0–36.0)
MCV: 93.3 fL (ref 80.0–100.0)
MONOS PCT: 5.3 %
MPV: 9.2 fL (ref 7.5–12.5)
NEUTROS ABS: 4434 {cells}/uL (ref 1500–7800)
Neutrophils Relative %: 65.2 %
Platelets: 254 10*3/uL (ref 140–400)
RBC: 4.17 10*6/uL — ABNORMAL LOW (ref 4.20–5.80)
RDW: 12.6 % (ref 11.0–15.0)
Total Lymphocyte: 27.6 %
WBC mixed population: 360 cells/uL (ref 200–950)
WBC: 6.8 10*3/uL (ref 3.8–10.8)

## 2017-12-18 LAB — RPR TITER: RPR Titer: 1:2 {titer} — ABNORMAL HIGH

## 2017-12-18 LAB — RPR: RPR Ser Ql: REACTIVE — AB

## 2017-12-18 LAB — FLUORESCENT TREPONEMAL AB(FTA)-IGG-BLD: FLUORESCENT TREPONEMAL ABS: REACTIVE — AB

## 2018-01-24 ENCOUNTER — Ambulatory Visit (INDEPENDENT_AMBULATORY_CARE_PROVIDER_SITE_OTHER): Payer: Self-pay | Admitting: Infectious Disease

## 2018-01-24 ENCOUNTER — Encounter: Payer: Self-pay | Admitting: Infectious Disease

## 2018-01-24 VITALS — BP 121/81 | HR 82 | Temp 99.0°F | Wt 137.0 lb

## 2018-01-24 DIAGNOSIS — R7309 Other abnormal glucose: Secondary | ICD-10-CM

## 2018-01-24 DIAGNOSIS — F17200 Nicotine dependence, unspecified, uncomplicated: Secondary | ICD-10-CM

## 2018-01-24 DIAGNOSIS — B20 Human immunodeficiency virus [HIV] disease: Secondary | ICD-10-CM

## 2018-01-24 DIAGNOSIS — R05 Cough: Secondary | ICD-10-CM

## 2018-01-24 DIAGNOSIS — Z23 Encounter for immunization: Secondary | ICD-10-CM

## 2018-01-24 DIAGNOSIS — L270 Generalized skin eruption due to drugs and medicaments taken internally: Secondary | ICD-10-CM

## 2018-01-24 DIAGNOSIS — R059 Cough, unspecified: Secondary | ICD-10-CM | POA: Insufficient documentation

## 2018-01-24 HISTORY — DX: Cough, unspecified: R05.9
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
## 2018-02-21 ENCOUNTER — Other Ambulatory Visit: Payer: Self-pay | Admitting: Infectious Disease

## 2018-02-21 DIAGNOSIS — B20 Human immunodeficiency virus [HIV] disease: Secondary | ICD-10-CM

## 2018-03-21 ENCOUNTER — Other Ambulatory Visit: Payer: Self-pay | Admitting: Infectious Disease

## 2018-03-21 DIAGNOSIS — B20 Human immunodeficiency virus [HIV] disease: Secondary | ICD-10-CM
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
## 2018-04-26 ENCOUNTER — Ambulatory Visit: Payer: Self-pay | Admitting: Infectious Diseases

## 2018-05-24 ENCOUNTER — Other Ambulatory Visit: Payer: Self-pay | Admitting: Infectious Disease

## 2018-05-24 DIAGNOSIS — B20 Human immunodeficiency virus [HIV] disease: Secondary | ICD-10-CM

## 2018-05-25 ENCOUNTER — Telehealth: Payer: Self-pay

## 2018-05-25 NOTE — Telephone Encounter (Signed)
Called patient to schedule overdue appointment for lab work/office visit. Patient was able to take my call and schedule both appointment for April. Patient does not have any complaints about medication/ or concerns at this time. [place]

## 2018-05-28 ENCOUNTER — Other Ambulatory Visit: Payer: Self-pay

## 2018-06-11 ENCOUNTER — Telehealth: Payer: Self-pay | Admitting: Infectious Disease

## 2018-06-11 NOTE — Telephone Encounter (Signed)
COVID-19 Pre-Screening Questions:  Do you currently have a fever (>100 F), chills or unexplained body aches? NO   Are you currently experiencing new cough, shortness of breath, sore throat, runny nose?NO .  Have you recently travelled outside the state of [place] in the last 14 days? NO .  Have you been in contact with someone that is currently pending confirmation of Covid19 testing or has been confirmed to have the Covid19 virus?  NO  **If the patient answers NO to ALL questions -  advise the patient to please call the clinic before coming to the office should any symptoms develop.     

## 2018-06-12 ENCOUNTER — Other Ambulatory Visit: Payer: Self-pay

## 2018-06-12 ENCOUNTER — Ambulatory Visit: Payer: Self-pay

## 2018-06-12 ENCOUNTER — Encounter: Payer: Self-pay | Admitting: Infectious Disease

## 2018-06-12 DIAGNOSIS — R05 Cough: Secondary | ICD-10-CM

## 2018-06-12 DIAGNOSIS — L270 Generalized skin eruption due to drugs and medicaments taken internally: Secondary | ICD-10-CM

## 2018-06-12 DIAGNOSIS — R059 Cough, unspecified: Secondary | ICD-10-CM

## 2018-06-12 DIAGNOSIS — F17200 Nicotine dependence, unspecified, uncomplicated: Secondary | ICD-10-CM

## 2018-06-12 DIAGNOSIS — B20 Human immunodeficiency virus [HIV] disease: Secondary | ICD-10-CM

## 2018-06-12 DIAGNOSIS — R7309 Other abnormal glucose: Secondary | ICD-10-CM

## 2018-06-13 LAB — T-HELPER CELL (CD4) - (RCID CLINIC ONLY)
CD4 % Helper T Cell: 44 % (ref 33–55)
CD4 T Cell Abs: 880 /uL (ref 400–2700)

## 2018-06-14 LAB — COMPLETE METABOLIC PANEL WITH GFR
AG Ratio: 1.9 (calc) (ref 1.0–2.5)
ALT: 19 U/L (ref 9–46)
AST: 24 U/L (ref 10–35)
Albumin: 4.5 g/dL (ref 3.6–5.1)
Alkaline phosphatase (APISO): 44 U/L (ref 35–144)
BUN: 15 mg/dL (ref 7–25)
CO2: 27 mmol/L (ref 20–32)
Calcium: 9.1 mg/dL (ref 8.6–10.3)
Chloride: 106 mmol/L (ref 98–110)
Creat: 0.92 mg/dL (ref 0.70–1.33)
GFR, Est African American: 107 mL/min/{1.73_m2} (ref >=60)
GFR, Est Non African American: 92 mL/min/{1.73_m2} (ref >=60)
Globulin: 2.4 g/dL (calc) (ref 1.9–3.7)
Glucose, Bld: 120 mg/dL — ABNORMAL HIGH (ref 65–99)
Potassium: 4.4 mmol/L (ref 3.5–5.3)
Sodium: 142 mmol/L (ref 135–146)
Total Bilirubin: 0.5 mg/dL (ref 0.2–1.2)
Total Protein: 6.9 g/dL (ref 6.1–8.1)

## 2018-06-14 LAB — CBC WITH DIFFERENTIAL/PLATELET
Absolute Monocytes: 380 cells/uL (ref 200–950)
Basophils Absolute: 83 cells/uL (ref 0–200)
Basophils Relative: 1.2 %
Eosinophils Absolute: 117 cells/uL (ref 15–500)
Eosinophils Relative: 1.7 %
HCT: 41.2 % (ref 38.5–50.0)
Hemoglobin: 14.2 g/dL (ref 13.2–17.1)
Lymphs Abs: 2132 cells/uL (ref 850–3900)
MCH: 32.3 pg (ref 27.0–33.0)
MCHC: 34.5 g/dL (ref 32.0–36.0)
MCV: 93.8 fL (ref 80.0–100.0)
MPV: 9.4 fL (ref 7.5–12.5)
Monocytes Relative: 5.5 %
Neutro Abs: 4188 cells/uL (ref 1500–7800)
Neutrophils Relative %: 60.7 %
Platelets: 194 10*3/uL (ref 140–400)
RBC: 4.39 10*6/uL (ref 4.20–5.80)
RDW: 12.8 % (ref 11.0–15.0)
Total Lymphocyte: 30.9 %
WBC: 6.9 10*3/uL (ref 3.8–10.8)

## 2018-06-14 LAB — RPR TITER: RPR Titer: 1:1 {titer} — ABNORMAL HIGH

## 2018-06-14 LAB — HEMOGLOBIN A1C
Hgb A1c MFr Bld: 5.5 % of total Hgb (ref <5.7)
Mean Plasma Glucose: 111 (calc)
eAG (mmol/L): 6.2 (calc)

## 2018-06-14 LAB — RPR: RPR Ser Ql: REACTIVE — AB

## 2018-06-14 LAB — FLUORESCENT TREPONEMAL AB(FTA)-IGG-BLD: Fluorescent Treponemal ABS: REACTIVE — AB

## 2018-06-14 LAB — HEPATITIS B SURFACE ANTIBODY, QUANTITATIVE: Hepatitis B-Post: 56 m[IU]/mL (ref >=10)

## 2018-06-18 ENCOUNTER — Other Ambulatory Visit: Payer: Self-pay | Admitting: Infectious Disease

## 2018-06-18 DIAGNOSIS — B20 Human immunodeficiency virus [HIV] disease: Secondary | ICD-10-CM

## 2018-06-18 LAB — HIV-1 RNA QUANT-NO REFLEX-BLD
HIV 1 RNA Quant: 33 copies/mL — ABNORMAL HIGH
HIV-1 RNA Quant, Log: 1.52 Log copies/mL — ABNORMAL HIGH

## 2018-06-27 ENCOUNTER — Ambulatory Visit (INDEPENDENT_AMBULATORY_CARE_PROVIDER_SITE_OTHER): Payer: Self-pay | Admitting: Infectious Disease

## 2018-06-27 ENCOUNTER — Encounter: Payer: Self-pay | Admitting: Infectious Disease

## 2018-06-27 ENCOUNTER — Other Ambulatory Visit: Payer: Self-pay

## 2018-06-27 VITALS — BP 106/74 | HR 82 | Temp 98.4°F | Wt 138.0 lb

## 2018-06-27 DIAGNOSIS — B20 Human immunodeficiency virus [HIV] disease: Secondary | ICD-10-CM

## 2018-06-27 DIAGNOSIS — Z23 Encounter for immunization: Secondary | ICD-10-CM

## 2018-06-27 DIAGNOSIS — F17200 Nicotine dependence, unspecified, uncomplicated: Secondary | ICD-10-CM
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
## 2018-07-11 ENCOUNTER — Other Ambulatory Visit: Payer: Self-pay | Admitting: Infectious Disease

## 2018-07-11 DIAGNOSIS — B20 Human immunodeficiency virus [HIV] disease: Secondary | ICD-10-CM

## 2018-10-22 ENCOUNTER — Encounter: Payer: Self-pay | Admitting: Infectious Disease

## 2018-10-22 ENCOUNTER — Ambulatory Visit (INDEPENDENT_AMBULATORY_CARE_PROVIDER_SITE_OTHER): Payer: Self-pay | Admitting: Infectious Disease

## 2018-10-22 ENCOUNTER — Other Ambulatory Visit: Payer: Self-pay

## 2018-10-22 VITALS — BP 146/77 | HR 71 | Temp 98.5°F

## 2018-10-22 DIAGNOSIS — B20 Human immunodeficiency virus [HIV] disease: Secondary | ICD-10-CM

## 2018-10-22 DIAGNOSIS — F17200 Nicotine dependence, unspecified, uncomplicated: Secondary | ICD-10-CM
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
## 2018-11-21 ENCOUNTER — Other Ambulatory Visit: Payer: Self-pay | Admitting: Infectious Disease

## 2018-11-21 DIAGNOSIS — B20 Human immunodeficiency virus [HIV] disease: Secondary | ICD-10-CM

## 2019-02-13 ENCOUNTER — Ambulatory Visit: Payer: Self-pay

## 2019-12-07 ENCOUNTER — Other Ambulatory Visit: Payer: Self-pay

## 2019-12-07 ENCOUNTER — Ambulatory Visit (HOSPITAL_COMMUNITY)
Admission: EM | Admit: 2019-12-07 | Discharge: 2019-12-07 | Disposition: A | Discharge status: Home / Self Care | Payer: Self-pay | Attending: Emergency Medicine | Admitting: Emergency Medicine

## 2019-12-07 ENCOUNTER — Encounter (HOSPITAL_COMMUNITY): Payer: Self-pay

## 2019-12-07 ENCOUNTER — Ambulatory Visit (INDEPENDENT_AMBULATORY_CARE_PROVIDER_SITE_OTHER): Payer: Self-pay

## 2019-12-07 DIAGNOSIS — R1310 Dysphagia, unspecified: Secondary | ICD-10-CM

## 2019-12-07 DIAGNOSIS — R079 Chest pain, unspecified: Secondary | ICD-10-CM

## 2019-12-07 DIAGNOSIS — R0789 Other chest pain: Secondary | ICD-10-CM

## 2019-12-07 MED ORDER — ALUM & MAG HYDROXIDE-SIMETH 200-200-20 MG/5ML PO SUSP
ORAL | Status: AC
  Filled 2019-12-07: qty 30

## 2019-12-07 MED ORDER — OMEPRAZOLE 20 MG PO CPDR: 20.0000 mg | DELAYED_RELEASE_CAPSULE | Freq: Two times a day (BID) | ORAL | 0 refills | Status: DC

## 2019-12-07 MED ORDER — LIDOCAINE VISCOUS HCL 2 % MT SOLN
OROMUCOSAL | Status: AC
  Filled 2019-12-07: qty 15

## 2019-12-07 MED ORDER — LIDOCAINE VISCOUS HCL 2 % MT SOLN
15.0000 mL | Freq: Once | OROMUCOSAL | Status: AC
  Administered 2019-12-07: 15 mL via ORAL

## 2019-12-07 MED ORDER — ALUM & MAG HYDROXIDE-SIMETH 200-200-20 MG/5ML PO SUSP
30.0000 mL | Freq: Once | ORAL | Status: AC
  Administered 2019-12-07: 30 mL via ORAL

## 2019-12-07 NOTE — ED Triage Notes (Signed)
Pt present trouble swallowing with chest pain, pt states that when he eats or drinks it hurts his chest. Certain movements hurts his chest. Everytime he swallow or attempts to eat that's when he fell the pain.
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
## 2019-12-07 NOTE — Discharge Instructions (Signed)
EKG and chest x-ray normal Referral placed to gastroenterology-they will contact you for follow-up Begin omeprazole twice daily over the next 2 weeks, please use consistently to help prevent underlying acid/inflammation contributing to symptoms If symptoms progressing or worsening, having difficulty breathing or increased pain please return for reevaluation

## 2019-12-12 ENCOUNTER — Ambulatory Visit (INDEPENDENT_AMBULATORY_CARE_PROVIDER_SITE_OTHER): Payer: Self-pay | Admitting: Nurse Practitioner

## 2019-12-12 ENCOUNTER — Encounter: Payer: Self-pay | Admitting: Nurse Practitioner

## 2019-12-12 VITALS — BP 90/60 | HR 84 | Ht 63.0 in | Wt 122.6 lb

## 2019-12-12 DIAGNOSIS — R1310 Dysphagia, unspecified: Secondary | ICD-10-CM

## 2019-12-12 DIAGNOSIS — B370 Candidal stomatitis: Secondary | ICD-10-CM

## 2019-12-12 DIAGNOSIS — B20 Human immunodeficiency virus [HIV] disease: Secondary | ICD-10-CM

## 2019-12-12 MED ORDER — FLUCONAZOLE 100 MG PO TABS: ORAL_TABLET | ORAL | 0 refills | Status: DC

## 2019-12-12 NOTE — Progress Notes (Signed)
Reviewed and agree with management plan.  Savreen Gebhardt T. Jakeira Seeman, MD FACG [facility]  

## 2019-12-12 NOTE — Patient Instructions (Addendum)
We have sent the following medications to your pharmacy for you to pick up at your convenience: Diflucan 200 mg (2 tablets) on day 1  Then, take 100 mg (1 tablet) on day 2-20  Then, STOP  ________________________________________________________________  Follow up with Carl Best, NP on Friday, 01/17/20 at 1:30 pm.  ________________________________________________________________  Eat a soft diet for now until your swallowing improves.  ________________________________________________________________  Call our office if your symptoms worsen. Phone [phone].  ________________________________________________________________  Dennis Bast have been scheduled for a lab appointment on Tuesday, 12/17/19 at [facility], [place], [place] [address].  You have been scheduled for an appointment with Dr Laurence Compton on 12/31/19 at 4:15 pm. It is VERY important that you go to BOTH of these appointments! ________________________________________________________________ If you are age 59 or older, your body mass index should be between 23-30. Your Body mass index is 21.72 kg/m. If this is out of the aforementioned range listed, please consider follow up with your Primary Care Provider.  If you are age 59 or younger, your body mass index should be between 19-25. Your Body mass index is 21.72 kg/m. If this is out of the aformentioned range listed, please consider follow up with your Primary Care Provider.   Due to recent changes in healthcare laws, you may see the results of your imaging and laboratory studies on MyChart before your provider has had a chance to review them.  We understand that in some cases there may be results that are confusing or concerning to you. Not all laboratory results come back in the same time frame and the provider may be waiting for multiple results in order to interpret others.  Please give [place] 48 hours in order for your provider to thoroughly review all the results before  contacting the office for clarification of your results.
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
## 2019-12-17 ENCOUNTER — Encounter: Payer: Self-pay | Admitting: Internal Medicine

## 2019-12-17 ENCOUNTER — Ambulatory Visit: Payer: Self-pay

## 2019-12-17 ENCOUNTER — Other Ambulatory Visit: Payer: Self-pay

## 2019-12-17 DIAGNOSIS — B20 Human immunodeficiency virus [HIV] disease: Secondary | ICD-10-CM

## 2019-12-17 DIAGNOSIS — F17200 Nicotine dependence, unspecified, uncomplicated: Secondary | ICD-10-CM

## 2019-12-18 LAB — T-HELPER CELL (CD4) - (RCID CLINIC ONLY)
CD4 % Helper T Cell: 23 % — ABNORMAL LOW (ref 33–65)
CD4 T Cell Abs: 371 /uL — ABNORMAL LOW (ref 400–1790)

## 2019-12-19 LAB — CBC WITH DIFFERENTIAL/PLATELET
Absolute Monocytes: 314 cells/uL (ref 200–950)
Basophils Absolute: 49 cells/uL (ref 0–200)
Basophils Relative: 1 %
Eosinophils Absolute: 29 cells/uL (ref 15–500)
Eosinophils Relative: 0.6 %
HCT: 36.4 % — ABNORMAL LOW (ref 38.5–50.0)
Hemoglobin: 12.8 g/dL — ABNORMAL LOW (ref 13.2–17.1)
Lymphs Abs: 1906 cells/uL (ref 850–3900)
MCH: 32 pg (ref 27.0–33.0)
MCHC: 35.2 g/dL (ref 32.0–36.0)
MCV: 91 fL (ref 80.0–100.0)
MPV: 9.4 fL (ref 7.5–12.5)
Monocytes Relative: 6.4 %
Neutro Abs: 2602 cells/uL (ref 1500–7800)
Neutrophils Relative %: 53.1 %
Platelets: 239 10*3/uL (ref 140–400)
RBC: 4 10*6/uL — ABNORMAL LOW (ref 4.20–5.80)
RDW: 12.5 % (ref 11.0–15.0)
Total Lymphocyte: 38.9 %
WBC: 4.9 10*3/uL (ref 3.8–10.8)

## 2019-12-19 LAB — HIV-1 RNA QUANT-NO REFLEX-BLD
HIV 1 RNA Quant: 21700 Copies/mL — ABNORMAL HIGH
HIV-1 RNA Quant, Log: 4.34 Log cps/mL — ABNORMAL HIGH

## 2019-12-19 LAB — COMPLETE METABOLIC PANEL WITH GFR
AG Ratio: 0.9 (calc) — ABNORMAL LOW (ref 1.0–2.5)
ALT: 24 U/L (ref 9–46)
AST: 30 U/L (ref 10–35)
Albumin: 3.5 g/dL — ABNORMAL LOW (ref 3.6–5.1)
Alkaline phosphatase (APISO): 55 U/L (ref 35–144)
BUN: 10 mg/dL (ref 7–25)
CO2: 27 mmol/L (ref 20–32)
Calcium: 8.8 mg/dL (ref 8.6–10.3)
Chloride: 105 mmol/L (ref 98–110)
Creat: 0.79 mg/dL (ref 0.70–1.33)
GFR, Est African American: 114 mL/min/{1.73_m2} (ref >=60)
GFR, Est Non African American: 98 mL/min/{1.73_m2} (ref >=60)
Globulin: 4 g/dL (calc) — ABNORMAL HIGH (ref 1.9–3.7)
Glucose, Bld: 88 mg/dL (ref 65–99)
Potassium: 4.2 mmol/L (ref 3.5–5.3)
Sodium: 138 mmol/L (ref 135–146)
Total Bilirubin: 0.3 mg/dL (ref 0.2–1.2)
Total Protein: 7.5 g/dL (ref 6.1–8.1)

## 2019-12-19 LAB — FLUORESCENT TREPONEMAL AB(FTA)-IGG-BLD: Fluorescent Treponemal ABS: REACTIVE — AB

## 2019-12-19 LAB — RPR: RPR Ser Ql: REACTIVE — AB

## 2019-12-19 LAB — RPR TITER: RPR Titer: 1:2 {titer} — ABNORMAL HIGH

## 2019-12-24 ENCOUNTER — Other Ambulatory Visit: Payer: Self-pay | Admitting: Infectious Disease

## 2019-12-24 DIAGNOSIS — B20 Human immunodeficiency virus [HIV] disease: Secondary | ICD-10-CM

## 2019-12-24 MED ORDER — BIKTARVY 50-200-25 MG PO TABS: 1.0000 | ORAL_TABLET | Freq: Every day | ORAL | 11 refills | Status: DC

## 2019-12-25 ENCOUNTER — Telehealth: Payer: Self-pay

## 2019-12-25 NOTE — Telephone Encounter (Signed)
-----   Message from Truman Hayward, MD sent at 12/24/2019  3:54 PM EDT ----- Has he been off meds? This is most viremic in years he has been. I am ordering genotype to add on

## 2019-12-25 NOTE — Telephone Encounter (Signed)
Attempted to contact patient. No voicemail left due to unidentifiable phone number. Will attempted to contact again. Christopher Farrell

## 2019-12-30 NOTE — Addendum Note (Signed)
Addended by: Caffie Pinto on: 12/30/2019 10:41 AM   Modules accepted: Orders

## 2019-12-30 NOTE — Addendum Note (Signed)
Addended by: Caffie Pinto on: 12/30/2019 09:58 AM   Modules accepted: Orders

## 2019-12-31 ENCOUNTER — Ambulatory Visit (INDEPENDENT_AMBULATORY_CARE_PROVIDER_SITE_OTHER): Payer: Self-pay | Admitting: Internal Medicine

## 2019-12-31 ENCOUNTER — Other Ambulatory Visit: Payer: Self-pay

## 2019-12-31 ENCOUNTER — Encounter: Payer: Self-pay | Admitting: Internal Medicine

## 2019-12-31 VITALS — BP 122/78 | HR 82 | Temp 98.7°F | Wt 126.0 lb

## 2019-12-31 DIAGNOSIS — B3781 Candidal esophagitis: Secondary | ICD-10-CM

## 2019-12-31 DIAGNOSIS — B20 Human immunodeficiency virus [HIV] disease: Secondary | ICD-10-CM

## 2019-12-31 DIAGNOSIS — Z23 Encounter for immunization: Secondary | ICD-10-CM

## 2019-12-31 DIAGNOSIS — Z720 Tobacco use: Secondary | ICD-10-CM

## 2019-12-31 NOTE — Patient Instructions (Signed)
Thank you for coming to our clinic   Will start biktarvy again. Please follow up and do blood work in 4 weeks   You don't need any more pills for your trouble with swallowing, but follow up with the GI doctor as planned  Please consider stopping smoking  We will also schedule you for your covid vaccine
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
## 2020-01-02 ENCOUNTER — Other Ambulatory Visit: Payer: Self-pay

## 2020-01-02 ENCOUNTER — Ambulatory Visit (INDEPENDENT_AMBULATORY_CARE_PROVIDER_SITE_OTHER): Payer: Self-pay

## 2020-01-02 DIAGNOSIS — Z23 Encounter for immunization: Secondary | ICD-10-CM

## 2020-01-02 NOTE — Progress Notes (Signed)
   Covid-19 Vaccination Clinic  Name:  Christopher Farrell    MRN: [id number] DOB: Nov 13, 1960  01/02/2020  [name] was observed post Covid-19 immunization for 15 minutes without incident. He was provided with Vaccine Information Sheet and instruction to access the V-Safe system.   [name] was instructed to call 911 with any severe reactions post vaccine: [name] Difficulty breathing  . Swelling of face and throat  . A fast heartbeat  . A bad rash all over body  . Dizziness and weakness   Immunizations Administered    Name Date Dose VIS Date Route   Pfizer COVID-19 Vaccine 01/02/2020 11:22 AM 0.3 mL 05/01/2018 Intramuscular   Manufacturer: [place]   Lot: [place]: [place]

## 2020-01-03 ENCOUNTER — Encounter (HOSPITAL_COMMUNITY): Payer: Self-pay | Admitting: Emergency Medicine

## 2020-01-03 ENCOUNTER — Other Ambulatory Visit: Payer: Self-pay

## 2020-01-03 ENCOUNTER — Ambulatory Visit (HOSPITAL_COMMUNITY)
Admission: EM | Admit: 2020-01-03 | Discharge: 2020-01-03 | Disposition: A | Discharge status: Home / Self Care | Payer: Self-pay | Attending: Family Medicine | Admitting: Family Medicine

## 2020-01-03 ENCOUNTER — Telehealth (HOSPITAL_COMMUNITY): Payer: Self-pay | Admitting: Family Medicine

## 2020-01-03 DIAGNOSIS — K047 Periapical abscess without sinus: Secondary | ICD-10-CM

## 2020-01-03 MED ORDER — CLINDAMYCIN HCL 150 MG PO CAPS: 150.0000 mg | ORAL_CAPSULE | Freq: Three times a day (TID) | ORAL | 0 refills | Status: DC

## 2020-01-03 MED ORDER — HYDROCODONE-ACETAMINOPHEN 5-325 MG PO TABS: 1.0000 | ORAL_TABLET | Freq: Four times a day (QID) | ORAL | 0 refills | Status: DC | PRN

## 2020-01-03 NOTE — Discharge Instructions (Addendum)
Take the antibiotic as directed Use warm compresses to area Take the pain medicine as needed Do not drive on the hydrocodone Follow up with a dentist
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
## 2020-01-03 NOTE — ED Triage Notes (Signed)
Patient c/o LFT sided facial swelling since yesterday.   Patient stated swelling has worsened this morning.   Patient endorses dental pain.   Patient stated one of his teeth upper row has "had a lot of pain" for the past 4 days.

## 2020-01-03 NOTE — Telephone Encounter (Signed)
error 

## 2020-01-14 LAB — HIV-1 GENOTYPING (RTI,PI,IN INHBTR): HIV-1 Genotype: DETECTED — AB

## 2020-01-17 ENCOUNTER — Ambulatory Visit (INDEPENDENT_AMBULATORY_CARE_PROVIDER_SITE_OTHER): Payer: Self-pay | Admitting: Nurse Practitioner

## 2020-01-17 ENCOUNTER — Encounter: Payer: Self-pay | Admitting: Nurse Practitioner

## 2020-01-17 VITALS — BP 100/68 | HR 88 | Ht 65.5 in | Wt 131.4 lb

## 2020-01-17 DIAGNOSIS — Z1211 Encounter for screening for malignant neoplasm of colon: Secondary | ICD-10-CM

## 2020-01-17 DIAGNOSIS — B370 Candidal stomatitis: Secondary | ICD-10-CM

## 2020-01-17 DIAGNOSIS — R1310 Dysphagia, unspecified: Secondary | ICD-10-CM

## 2020-01-17 MED ORDER — PLENVU 140 G PO SOLR: 1.0000 | ORAL | 0 refills | Status: DC

## 2020-01-17 NOTE — Progress Notes (Signed)
01/17/2020 KARTEL WOLBERT [id number] 06-14-1960   Chief Complaint: follow up difficulty swallowing   History of Present Illness: Christopher Farrell is a 59 year old male with a history of being HIV positive since 1992. He re-established ID management with Dr. Gale Journey in the process to restart [facility]. He continues to smoke cigarettes 1/2 ppd. I initially saw the patient in the office on 12/12/2019 for further evaluation for dysphagia/odynophagia. He was assessed to have oral candidiasis and most likely candidiasis esophagitis. He was prescribed Diflucan for 3 weeks. He stated he completed 2 1/2 weeks of the Diflucan then stopped. His dysphagia/odynophagia resolved within 3 to 4 days after taking the Diflucan. He is eating a regular diet. He has gained 5 lbs over the past 2 to 3 weeks. BMs are normal. No rectal bleeding or black stools. He went to the ED on 01/03/2020 due to having a dental infection. He was prescribed Clindamycin 150mg  tid x 7 days and his dental pain abated. He is in the process of searching for an affordable dentis. No other complaints today.   CBC Latest Ref Rng & Units 12/17/2019 06/12/2018 12/14/2017  WBC 3.8 - 10.8 Thousand/uL 4.9 6.9 6.8  Hemoglobin 13.2 - 17.1 g/dL 12.8(L) 14.2 13.1(L)  Hematocrit 38 - 50 % 36.4(L) 41.2 38.9  Platelets 140 - 400 Thousand/uL 239 194 254  MCV 91.0.  CMP Latest Ref Rng & Units 12/17/2019 06/12/2018 12/14/2017  Glucose 65 - 99 mg/dL 88 120(H) 120(H)  BUN 7 - 25 mg/dL 10 15 16   Creatinine 0.70 - 1.33 mg/dL 0.79 0.92 1.04  Sodium 135 - 146 mmol/L 138 142 142  Potassium 3.5 - 5.3 mmol/L 4.2 4.4 4.8  Chloride 98 - 110 mmol/L 105 106 106  CO2 20 - 32 mmol/L 27 27 32  Calcium 8.6 - 10.3 mg/dL 8.8 9.1 9.4  Total Protein 6.1 - 8.1 g/dL 7.5 6.9 6.5  Total Bilirubin 0.2 - 1.2 mg/dL 0.3 0.5 0.4  Alkaline Phos 40 - 115 U/L - - -  AST 10 - 35 U/L 30 24 19   ALT 9 - 46 U/L 24 19 17     Current Medications, Allergies, Past Medical History,  Past Surgical History, Family History and Social History were reviewed in [employer] record.   Review of Systems:   Constitutional: Negative for fever, sweats, chills. No further  Respiratory: Negative for shortness of breath.   Cardiovascular: Negative for chest pain, palpitations and leg swelling.  Gastrointestinal: See HPI.  Musculoskeletal: Negative for back pain or muscle aches.  Neurological: Negative for dizziness, headaches or paresthesias.    Physical Exam: BP 100/68 (BP Location: Left Arm, Patient Position: Sitting, Cuff Size: Normal)   Pulse 88   Ht 5' 5.5" (1.664 m) Comment: height measured without shoes  Wt 131 lb 6 oz (59.6 kg)   BMI 21.53 kg/m    Wt Readings from Last 3 Encounters:  01/17/20 131 lb 6 oz (59.6 kg)  01/03/20 126 lb 1.7 oz (57.2 kg)  12/31/19 126 lb (57.2 kg)   General: 59 year old male in no acute distress. Head: Normocephalic and atraumatic. Eyes: No scleral icterus. Conjunctiva pink . Ears: Normal auditory acuity. Mouth: Left upper tooth broken with decay no purulent drainage or edema. No ulcers or lesions.  Lungs: Clear throughout to auscultation. Heart: Regular rate and rhythm, no murmur. Abdomen: Soft, nontender and nondistended. No masses or hepatomegaly. Normal bowel sounds x 4 quadrants.  Rectal: Deferred.  Musculoskeletal:  Symmetrical with no gross deformities. Extremities: No edema. Neurological: Alert oriented x 4. No focal deficits.  Psychological: Alert and cooperative. Normal mood and affect  Assessment and Recommendations:  1. Dysphagia, most likely due to candidiasis esophagitis resolved after taking Diflucan for 2 1/2 weeks (was prescribed 3 weeks). -EGD benefits and risks discussed including risk with sedation, risk of bleeding, perforation and infection  -Smoking cessation recommended   2. Colon cancer screening -Colonoscopy benefits and risks discussed including risk with sedation, risk of  bleeding, perforation and infection   3. Normocytic anemia.  -See plan in # 1 and # 2 -Repeat CBC after financial assistance obtained   4. Weight loss, improving   5. HIV +   6. Dental caries -recommended dental evaluation prior to proceeding with EGD and colonoscopy   Patient to apply for [place]  Patient is uninsured at this time, however, he wishes to proceed with EGD and colonoscopy

## 2020-01-17 NOTE — Patient Instructions (Addendum)
If you are age 59 or older, your body mass index should be between 23-30. Your Body mass index is 21.53 kg/m. If this is out of the aforementioned range listed, please consider follow up with your Primary Care Provider.  If you are age 37 or younger, your body mass index should be between 19-25. Your Body mass index is 21.53 kg/m. If this is out of the aformentioned range listed, please consider follow up with your Primary Care Provider.   You have been scheduled for an endoscopy and colonoscopy. Please follow the written instructions given to you at your visit today. Please pick up your prep supplies at the pharmacy within the next 1-3 days. If you use inhalers (even only as needed), please bring them with you on the day of your procedure.  Christopher Farrell recommends a dental evaluation prior to your EGD/Colonoscopy.  Further evaluation will be determined after EGD/Colonoscopy has been completed.

## 2020-01-17 NOTE — Progress Notes (Signed)
Reviewed and agree with management plan.  Tziporah Knoke T. Keashia Haskins, MD FACG [facility]  

## 2020-01-20 NOTE — Progress Notes (Signed)
Thanks for seeing Shant!

## 2020-01-24 ENCOUNTER — Ambulatory Visit (INDEPENDENT_AMBULATORY_CARE_PROVIDER_SITE_OTHER): Payer: Self-pay

## 2020-01-24 ENCOUNTER — Other Ambulatory Visit: Payer: Self-pay

## 2020-01-24 DIAGNOSIS — Z23 Encounter for immunization: Secondary | ICD-10-CM

## 2020-01-24 NOTE — Progress Notes (Addendum)
   Covid-19 Vaccination Clinic  Name:  Christopher Farrell    MRN: [id number] DOB: 06/26/1960  01/24/2020  [name] was observed post Covid-19 immunization for 15 minutes without incident. He was provided with Vaccine Information Sheet and instruction to access the V-Safe system.   [name] was instructed to call 911 with any severe reactions post vaccine: [name] Difficulty breathing  . Swelling of face and throat  . A fast heartbeat  . A bad rash all over body  . Dizziness and weakness   Immunizations Administered    Name Date Dose VIS Date Route   Pfizer COVID-19 Vaccine 01/24/2020 10:26 AM 0.3 mL 12/25/2019 Intramuscular   Manufacturer: [place]   Lot: [id number]   NDC: [address]-8    Landis Gandy, RN

## 2020-02-13 ENCOUNTER — Ambulatory Visit (INDEPENDENT_AMBULATORY_CARE_PROVIDER_SITE_OTHER): Payer: Self-pay | Admitting: Internal Medicine

## 2020-02-13 ENCOUNTER — Encounter: Payer: Self-pay | Admitting: Internal Medicine

## 2020-02-13 ENCOUNTER — Other Ambulatory Visit: Payer: Self-pay

## 2020-02-13 VITALS — BP 126/83 | HR 76 | Temp 98.1°F | Ht 64.0 in | Wt 128.0 lb

## 2020-02-13 DIAGNOSIS — Z23 Encounter for immunization: Secondary | ICD-10-CM

## 2020-02-13 DIAGNOSIS — B20 Human immunodeficiency virus [HIV] disease: Secondary | ICD-10-CM

## 2020-02-13 NOTE — Patient Instructions (Addendum)
Thank you for seeing [place] today   You'll need a pneumonia vaccine today   I have ordered labs for hiv monitoring and hepatitis monitoring along with other routine labs  Follow up with [place] in 3 months    Egd/colonoscopy scheduled for 03/2020
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
## 2020-02-13 NOTE — Addendum Note (Signed)
Addended by: Landis Gandy on: 02/13/2020 12:01 PM   Modules accepted: Orders

## 2020-02-17 LAB — QUANTIFERON-TB GOLD PLUS
Mitogen-NIL: 3.81 IU/mL
NIL: 0.04 IU/mL
QuantiFERON-TB Gold Plus: NEGATIVE
TB1-NIL: <0 IU/mL
TB2-NIL: <0 IU/mL

## 2020-02-17 LAB — CBC
HCT: 39.1 % (ref 38.5–50.0)
Hemoglobin: 13.6 g/dL (ref 13.2–17.1)
MCH: 31.8 pg (ref 27.0–33.0)
MCHC: 34.8 g/dL (ref 32.0–36.0)
MCV: 91.4 fL (ref 80.0–100.0)
MPV: 9.1 fL (ref 7.5–12.5)
Platelets: 202 10*3/uL (ref 140–400)
RBC: 4.28 10*6/uL (ref 4.20–5.80)
RDW: 13.2 % (ref 11.0–15.0)
WBC: 3.8 10*3/uL (ref 3.8–10.8)

## 2020-02-17 LAB — COMPREHENSIVE METABOLIC PANEL
AG Ratio: 1.1 (calc) (ref 1.0–2.5)
ALT: 44 U/L (ref 9–46)
AST: 51 U/L — ABNORMAL HIGH (ref 10–35)
Albumin: 4 g/dL (ref 3.6–5.1)
Alkaline phosphatase (APISO): 63 U/L (ref 35–144)
BUN: 13 mg/dL (ref 7–25)
CO2: 28 mmol/L (ref 20–32)
Calcium: 8.7 mg/dL (ref 8.6–10.3)
Chloride: 108 mmol/L (ref 98–110)
Creat: 0.82 mg/dL (ref 0.70–1.33)
Globulin: 3.8 g/dL (calc) — ABNORMAL HIGH (ref 1.9–3.7)
Glucose, Bld: 93 mg/dL (ref 65–99)
Potassium: 4.2 mmol/L (ref 3.5–5.3)
Sodium: 142 mmol/L (ref 135–146)
Total Bilirubin: 0.4 mg/dL (ref 0.2–1.2)
Total Protein: 7.8 g/dL (ref 6.1–8.1)

## 2020-02-17 LAB — HEMOGLOBIN A1C
Hgb A1c MFr Bld: 5.6 % of total Hgb (ref <5.7)
Mean Plasma Glucose: 114 mg/dL
eAG (mmol/L): 6.3 mmol/L

## 2020-02-17 LAB — HEPATITIS B SURFACE ANTIBODY, QUANTITATIVE: Hep B S AB Quant (Post): 30 m[IU]/mL (ref >=10)

## 2020-02-17 LAB — HEPATITIS B SURFACE ANTIGEN: Hepatitis B Surface Ag: NONREACTIVE

## 2020-02-17 LAB — HIV-1 RNA QUANT-NO REFLEX-BLD
HIV 1 RNA Quant: 33300 Copies/mL — ABNORMAL HIGH
HIV-1 RNA Quant, Log: 4.52 Log cps/mL — ABNORMAL HIGH

## 2020-02-17 LAB — HEPATITIS C ANTIBODY
Hepatitis C Ab: NONREACTIVE
SIGNAL TO CUT-OFF: 0.08 (ref <1.00)

## 2020-02-17 LAB — LIPID PANEL
Cholesterol: 109 mg/dL (ref <200)
HDL: 60 mg/dL (ref >=40)
LDL Cholesterol (Calc): 36 mg/dL (calc)
Non-HDL Cholesterol (Calc): 49 mg/dL (calc) (ref <130)
Total CHOL/HDL Ratio: 1.8 (calc) (ref <5.0)
Triglycerides: 52 mg/dL (ref <150)

## 2020-02-17 LAB — VITAMIN D 25 HYDROXY (VIT D DEFICIENCY, FRACTURES): Vit D, 25-Hydroxy: 18 ng/mL — ABNORMAL LOW (ref 30–100)

## 2020-02-17 LAB — HEPATITIS B CORE ANTIBODY, TOTAL: Hep B Core Total Ab: REACTIVE — AB

## 2020-02-19 ENCOUNTER — Telehealth: Payer: Self-pay

## 2020-02-19 ENCOUNTER — Other Ambulatory Visit: Payer: Self-pay | Admitting: Internal Medicine

## 2020-02-19 DIAGNOSIS — B20 Human immunodeficiency virus [HIV] disease: Secondary | ICD-10-CM

## 2020-02-19 NOTE — Telephone Encounter (Signed)
Attempted to contact patient to coordinate 4 week follow up appointment with labs prior office office. Left patient a voicemail to return call. Christopher Farrell

## 2020-02-19 NOTE — Telephone Encounter (Signed)
-----   Message from Truman Hayward, MD sent at 02/19/2020  3:35 PM EST ----- I am not aware of any commerical TDM. I would consider putting him on symtuza if anxiety about adherence to his Biktarvy. We havent seen BIK R here YET but maybe we will some day ----- Message ----- From: Jabier Mutton, MD Sent: 02/19/2020   3:12 PM EST To: Truman Hayward, MD, [facility] Clinical Pool  Hi team, let's see this patient in 4 weeks with another set of preclinic viral load (please ask him to do 1 week before hand)  He had sworn to take his medication, but the viral load doesn't suggest so  I will also order hiv resistance testing for him as well  Dr Tommy Medal if you know of any reliable TDM for ART we can do on a few  patients for sure?  Thank you

## 2020-02-19 NOTE — Telephone Encounter (Signed)
-----   Message from Jabier Mutton, MD sent at 02/19/2020  3:12 PM EST ----- Hi team, let's see this patient in 4 weeks with another set of preclinic viral load (please ask him to do 1 week before hand)  He had sworn to take his medication, but the viral load doesn't suggest so  I will also order hiv resistance testing for him as well  Dr Tommy Medal if you know of any reliable TDM for ART we can do on a few  patients for sure?  Thank you

## 2020-02-26 NOTE — Addendum Note (Signed)
Addended by: Caffie Pinto on: 02/26/2020 03:12 PM   Modules accepted: Orders

## 2020-02-26 NOTE — Telephone Encounter (Signed)
Basic Viral Load has been cancelled.

## 2020-02-26 NOTE — Addendum Note (Signed)
Addended by: Caffie Pinto on: 02/26/2020 03:16 PM   Modules accepted: Orders

## 2020-02-26 NOTE — Addendum Note (Signed)
Addended by: Prudencio Pair T on: 02/26/2020 12:14 PM   Modules accepted: Orders

## 2020-02-26 NOTE — Addendum Note (Signed)
Addended by: Caffie Pinto on: 02/26/2020 03:17 PM   Modules accepted: Orders

## 2020-02-26 NOTE — Telephone Encounter (Signed)
Second attempt to reach patient. Noted patient has an upcoming appointment with financial team. Will add patient to lab schedule in case he shows up on 03/12/20 and schedule follow up with Dr. Gale Journey 2 weeks after labs.  Eugenia Mcalpine

## 2020-03-12 ENCOUNTER — Telehealth: Payer: Self-pay

## 2020-03-12 ENCOUNTER — Other Ambulatory Visit: Payer: Self-pay

## 2020-03-12 ENCOUNTER — Ambulatory Visit: Payer: Self-pay

## 2020-03-12 DIAGNOSIS — B20 Human immunodeficiency virus [HIV] disease: Secondary | ICD-10-CM

## 2020-03-12 NOTE — Telephone Encounter (Addendum)
[facility] Patient Advocate Encounter  Completed and sent [employer] Advancing Access application for BIKTARVY for this patient who is uninsured.    Patient is approved 01/06/22through 03/12/21       Clearance Coots, CPhT Specialty Pharmacy Patient Advocate [facility] Phone: [phone] Fax:  [phone]

## 2020-03-13 LAB — T-HELPER CELLS (CD4) COUNT (NOT AT ARMC)
CD4 % Helper T Cell: 24 % — ABNORMAL LOW (ref 33–65)
CD4 T Cell Abs: 475 /uL (ref 400–1790)

## 2020-03-17 ENCOUNTER — Telehealth: Payer: Self-pay | Admitting: *Deleted

## 2020-03-17 MED ORDER — PLENVU 140 G PO SOLR: 1.0000 | ORAL | 0 refills | Status: DC

## 2020-03-17 NOTE — Telephone Encounter (Signed)
Pt is here at the [facility], needing Plenvu RX sent to pharmacy.  RX sent to [facility] here in [place].

## 2020-03-19 ENCOUNTER — Ambulatory Visit: Payer: Self-pay | Admitting: Gastroenterology

## 2020-03-19 ENCOUNTER — Encounter: Payer: Self-pay | Admitting: Gastroenterology

## 2020-03-19 ENCOUNTER — Other Ambulatory Visit: Payer: Self-pay

## 2020-03-19 VITALS — BP 144/84 | HR 80 | Temp 97.7°F | Ht 65.0 in | Wt 131.0 lb

## 2020-03-19 DIAGNOSIS — R1310 Dysphagia, unspecified: Secondary | ICD-10-CM

## 2020-03-19 DIAGNOSIS — Z1211 Encounter for screening for malignant neoplasm of colon: Secondary | ICD-10-CM

## 2020-03-19 MED ORDER — SODIUM CHLORIDE 0.9 % IV SOLN
500.0000 mL | Freq: Once | INTRAVENOUS | Status: DC
Start: 2020-03-19 — End: 2020-03-20

## 2020-03-19 NOTE — Progress Notes (Signed)
Pt arrived for procedure and during interview it was discovered that pt did not take a colon prep.  Pt states he went to pharmacy to pick up the prep but they did not have it so he "just drank water".  Pt has decided to reschedule both the upper and colonoscopy for a later date.

## 2020-03-19 NOTE — Progress Notes (Signed)
VS by HC °

## 2020-03-20 ENCOUNTER — Ambulatory Visit (AMBULATORY_SURGERY_CENTER): Payer: Self-pay | Admitting: *Deleted

## 2020-03-20 ENCOUNTER — Other Ambulatory Visit: Payer: Self-pay

## 2020-03-20 VITALS — Ht 64.0 in | Wt 134.0 lb

## 2020-03-20 DIAGNOSIS — R1310 Dysphagia, unspecified: Secondary | ICD-10-CM

## 2020-03-20 DIAGNOSIS — Z1211 Encounter for screening for malignant neoplasm of colon: Secondary | ICD-10-CM

## 2020-03-20 MED ORDER — PLENVU 140 G PO SOLR: 1.0000 | Freq: Once | ORAL | 0 refills | Status: AC

## 2020-03-20 NOTE — Progress Notes (Signed)
Patient is here in-person for PV. Patient denies any allergies to eggs or soy. Patient denies any problems with anesthesia/sedation. Patient denies any oxygen use at home. Patient denies taking any diet/weight loss medications or blood thinners. Patient is not being treated for MRSA or C-diff. Patient is aware of our care-partner policy and LHTDS-28 safety protocol.  Pt denies any medical changes since last GI visit.  COVID-19 vaccines completed on 01/24/20, per patient.   Prep Plenvu sample given to the pt. Went over prep instructions slowly with pt.

## 2020-03-21 LAB — HIV RNA, RTPCR W/R GT (RTI, PI,INT)
HIV 1 RNA Quant: <20 copies/mL
HIV-1 RNA Quant, Log: <1.3 Log copies/mL

## 2020-03-26 ENCOUNTER — Other Ambulatory Visit: Payer: Self-pay

## 2020-03-26 ENCOUNTER — Encounter: Payer: Self-pay | Admitting: Gastroenterology

## 2020-03-26 ENCOUNTER — Ambulatory Visit (AMBULATORY_SURGERY_CENTER): Payer: Self-pay | Admitting: Gastroenterology

## 2020-03-26 ENCOUNTER — Ambulatory Visit: Payer: Self-pay | Admitting: Internal Medicine

## 2020-03-26 VITALS — BP 125/86 | HR 75 | Temp 97.8°F | Resp 0 | Ht 64.0 in | Wt 134.0 lb

## 2020-03-26 DIAGNOSIS — R634 Abnormal weight loss: Secondary | ICD-10-CM

## 2020-03-26 DIAGNOSIS — D120 Benign neoplasm of cecum: Secondary | ICD-10-CM

## 2020-03-26 DIAGNOSIS — R1310 Dysphagia, unspecified: Secondary | ICD-10-CM

## 2020-03-26 DIAGNOSIS — B9681 Helicobacter pylori [H. pylori] as the cause of diseases classified elsewhere: Secondary | ICD-10-CM

## 2020-03-26 DIAGNOSIS — D123 Benign neoplasm of transverse colon: Secondary | ICD-10-CM

## 2020-03-26 DIAGNOSIS — K2970 Gastritis, unspecified, without bleeding: Secondary | ICD-10-CM

## 2020-03-26 DIAGNOSIS — K2950 Unspecified chronic gastritis without bleeding: Secondary | ICD-10-CM

## 2020-03-26 DIAGNOSIS — Z1211 Encounter for screening for malignant neoplasm of colon: Secondary | ICD-10-CM

## 2020-03-26 DIAGNOSIS — D125 Benign neoplasm of sigmoid colon: Secondary | ICD-10-CM

## 2020-03-26 MED ORDER — SODIUM CHLORIDE 0.9 % IV SOLN: 500.0000 mL | Freq: Once | INTRAVENOUS | Status: DC

## 2020-03-26 NOTE — Patient Instructions (Signed)
Resume previous diet and medications, Await pathology results. No aspirin, ibuprofen, naproxen, or other non-steroidal anti-inflammatory drugs.   YOU HAD AN ENDOSCOPIC PROCEDURE TODAY AT [facility]:   Refer to the procedure report that was given to you for any specific questions about what was found during the examination.  If the procedure report does not answer your questions, please call your gastroenterologist to clarify.  If you requested that your care partner not be given the details of your procedure findings, then the procedure report has been included in a sealed envelope for you to review at your convenience later.  YOU SHOULD EXPECT: Some feelings of bloating in the abdomen. Passage of more gas than usual.  Walking can help get rid of the air that was put into your GI tract during the procedure and reduce the bloating. If you had a lower endoscopy (such as a colonoscopy or flexible sigmoidoscopy) you may notice spotting of blood in your stool or on the toilet paper. If you underwent a bowel prep for your procedure, you may not have a normal bowel movement for a few days.  Please Note:  You might notice some irritation and congestion in your nose or some drainage.  This is from the oxygen used during your procedure.  There is no need for concern and it should clear up in a day or so.  SYMPTOMS TO REPORT IMMEDIATELY:   Following lower endoscopy (colonoscopy or flexible sigmoidoscopy):  Excessive amounts of blood in the stool  Significant tenderness or worsening of abdominal pains  Swelling of the abdomen that is new, acute  Fever of 100F or higher   Following upper endoscopy (EGD)  Vomiting of blood or coffee ground material  New chest pain or pain under the shoulder blades  Painful or persistently difficult swallowing  New shortness of breath  Fever of 100F or higher  Black, tarry-looking stools  For urgent or emergent issues, a gastroenterologist can be  reached at any hour by calling [phone]. Do not use MyChart messaging for urgent concerns.    DIET:  We do recommend a small meal at first, but then you may proceed to your regular diet.  Drink plenty of fluids but you should avoid alcoholic beverages for 24 hours.  ACTIVITY:  You should plan to take it easy for the rest of today and you should NOT DRIVE or use heavy machinery until tomorrow (because of the sedation medicines used during the test).    FOLLOW UP: Our staff will call the number listed on your records 48-72 hours following your procedure to check on you and address any questions or concerns that you may have regarding the information given to you following your procedure. If we do not reach you, we will leave a message.  We will attempt to reach you two times.  During this call, we will ask if you have developed any symptoms of COVID 19. If you develop any symptoms (ie: fever, flu-like symptoms, shortness of breath, cough etc.) before then, please call [phone].  If you test positive for Covid 19 in the 2 weeks post procedure, please call and report this information to [place].    If any biopsies were taken you will be contacted by phone or by letter within the next 1-3 weeks.  Please call us at [phone] if you have not heard about the biopsies in 3 weeks.    SIGNATURES/CONFIDENTIALITY: You and/or your care partner have signed paperwork which will be  entered into your electronic medical record.  These signatures attest to the fact that that the information above on your After Visit Summary has been reviewed and is understood.  Full responsibility of the confidentiality of this discharge information lies with you and/or your care-partner. 

## 2020-03-26 NOTE — Progress Notes (Signed)
Called to room to assist during endoscopic procedure.  Patient ID and intended procedure confirmed with present staff. Received instructions for my participation in the procedure from the performing physician.  

## 2020-03-26 NOTE — Progress Notes (Signed)
Report to PACU, RN, vss, BBS= Clear.  

## 2020-03-26 NOTE — Op Note (Signed)
[facility] Patient Name: Christopher Farrell Procedure Date: 03/26/2020 11:38 AM MRN: [id number] Endoscopist: Ladene Artist , MD Age: 60 Referring MD:  Date of Birth: [birth year] Gender: Male Account #: [id number] Procedure:                Colonoscopy Indications:              Screening for colorectal malignant neoplasm Medicines:                Monitored Anesthesia Care Procedure:                Pre-Anesthesia Assessment:                           - Prior to the procedure, a History and Physical                            was performed, and patient medications and                            allergies were reviewed. The patient's tolerance of                            previous anesthesia was also reviewed. The risks                            and benefits of the procedure and the sedation                            options and risks were discussed with the patient.                            All questions were answered, and informed consent                            was obtained. Prior Anticoagulants: The patient has                            taken no previous anticoagulant or antiplatelet                            agents. ASA Grade Assessment: III - A patient with                            severe systemic disease. After reviewing the risks                            and benefits, the patient was deemed in                            satisfactory condition to undergo the procedure.                           After obtaining informed consent, the colonoscope  was passed under direct vision. Throughout the                            procedure, the patient's blood pressure, pulse, and                            oxygen saturations were monitored continuously. The                            Olympus CF-HQ190L ([id number]) Colonoscope was                            introduced through the anus and advanced to the the                            cecum,  identified by appendiceal orifice and                            ileocecal valve. The ileocecal valve, appendiceal                            orifice, and rectum were photographed. The quality                            of the bowel preparation was excellent. The                            colonoscopy was performed without difficulty. The                            patient tolerated the procedure well. Scope In: 11:46:22 AM Scope Out: 12:01:18 PM Scope Withdrawal Time: 0 hours 12 minutes 39 seconds  Total Procedure Duration: 0 hours 14 minutes 56 seconds  Findings:                 The perianal and digital rectal examinations were                            normal.                           A 10 mm polyp was found in the transverse colon.                            The polyp was semi-pedunculated. The polyp was                            removed with a cold snare. Resection and retrieval                            were complete.                           Three sessile polyps were found in the sigmoid  colon (1) and cecum (2). The polyps were 6 to 7 mm                            in size. These polyps were removed with a cold                            snare. Resection and retrieval were complete.                           Internal hemorrhoids were found during                            retroflexion. The hemorrhoids were moderate and                            Grade I (internal hemorrhoids that do not prolapse).                           The exam was otherwise without abnormality on                            direct and retroflexion views. Complications:            No immediate complications. Estimated blood loss:                            None. Estimated Blood Loss:     Estimated blood loss: none. Impression:               - One 10 mm polyp in the transverse colon, removed                            with a cold snare. Resected and retrieved.                            - Three 6 to 7 mm polyps in the sigmoid colon and                            in the cecum, removed with a cold snare. Resected                            and retrieved.                           - Internal hemorrhoids.                           - The examination was otherwise normal on direct                            and retroflexion views. Recommendation:           - Repeat colonoscopy date to be determined, likely  3 years, after pending pathology results are                            reviewed for surveillance based on pathology                            results.                           - Patient has a contact number available for                            emergencies. The signs and symptoms of potential                            delayed complications were discussed with the                            patient. Return to normal activities tomorrow.                            Written discharge instructions were provided to the                            patient.                           - Resume previous diet.                           - Continue present medications.                           - Await pathology results.                           - No aspirin, ibuprofen, naproxen, or other                            non-steroidal anti-inflammatory drugs for 2 weeks                            after polyp removal. Ladene Artist, MD 03/26/2020 12:14:12 PM This report has been signed electronically.

## 2020-03-26 NOTE — Op Note (Signed)
[place] Patient Name: Christopher Farrell Procedure Date: 03/26/2020 11:39 AM MRN: [id number] Endoscopist: Ladene Artist , MD Age: 60 Referring MD:  Date of Birth: 01-10-1961 Gender: Male Account #: [id number] Procedure:                Upper GI endoscopy Indications:              Dysphagia, Weight loss Medicines:                Monitored Anesthesia Care Procedure:                Pre-Anesthesia Assessment:                           - Prior to the procedure, a History and Physical                            was performed, and patient medications and                            allergies were reviewed. The patient's tolerance of                            previous anesthesia was also reviewed. The risks                            and benefits of the procedure and the sedation                            options and risks were discussed with the patient.                            All questions were answered, and informed consent                            was obtained. Prior Anticoagulants: The patient has                            taken no previous anticoagulant or antiplatelet                            agents. ASA Grade Assessment: III - A patient with                            severe systemic disease. After reviewing the risks                            and benefits, the patient was deemed in                            satisfactory condition to undergo the procedure.                           - Prior to the procedure, a History and Physical  was performed, and patient medications and                            allergies were reviewed. The patient's tolerance of                            previous anesthesia was also reviewed. The risks                            and benefits of the procedure and the sedation                            options and risks were discussed with the patient.                            All questions were answered, and  informed consent                            was obtained. Prior Anticoagulants: The patient has                            taken no previous anticoagulant or antiplatelet                            agents. ASA Grade Assessment: III - A patient with                            severe systemic disease. After reviewing the risks                            and benefits, the patient was deemed in                            satisfactory condition to undergo the procedure.                           After obtaining informed consent, the endoscope was                            passed under direct vision. Throughout the                            procedure, the patient's blood pressure, pulse, and                            oxygen saturations were monitored continuously. The                            Endoscope was introduced through the mouth, and                            advanced to the second part of duodenum. The upper  GI endoscopy was accomplished without difficulty.                            The patient tolerated the procedure well. Scope In: Scope Out: Findings:                 Patchy, white plaques were found in the entire                            esophagus. Biopsies were taken with a cold forceps                            for histology.                           The exam of the esophagus was otherwise normal.                           A few localized small erosions with no bleeding and                            no stigmata of recent bleeding were found in the                            prepyloric region of the stomach. Biopsies were                            taken with a cold forceps for histology.                           Diffuse mild inflammation characterized by erythema                            and granularity was found in the gastric fundus and                            in the gastric body. Biopsies were taken with a                             cold forceps for histology.                           The exam of the stomach was otherwise normal.                           The duodenal bulb and second portion of the                            duodenum were normal. Complications:            No immediate complications. Estimated Blood Loss:     Estimated blood loss was minimal. Impression:               - Esophageal plaques were found, consistent with  candidiasis. Biopsied.                           - Erosive gastropathy with no bleeding and no                            stigmata of recent bleeding. Biopsied.                           - Gastritis. Biopsied.                           - Normal duodenal bulb and second portion of the                            duodenum. Recommendation:           - Patient has a contact number available for                            emergencies. The signs and symptoms of potential                            delayed complications were discussed with the                            patient. Return to normal activities tomorrow.                            Written discharge instructions were provided to the                            patient.                           - Resume previous diet.                           - Continue present medications.                           - Await pathology results.                           - Follow up with Dr. Tommy Medal as planned. Ladene Artist, MD 03/26/2020 12:20:23 PM This report has been signed electronically.

## 2020-03-30 ENCOUNTER — Telehealth: Payer: Self-pay | Admitting: *Deleted

## 2020-03-30 NOTE — Telephone Encounter (Signed)
Attempted f/u phone call. No answer. Left message. °

## 2020-03-30 NOTE — Telephone Encounter (Signed)
  Follow up Call-  Call back number 03/26/2020  Post procedure Call Back phone  # [phone]  Permission to leave phone message Yes  Some recent data might be hidden     Patient questions:  Do you have a fever, pain , or abdominal swelling? No. Pain Score  0 *  Have you tolerated food without any problems? Yes.    Have you been able to return to your normal activities? Yes.    Do you have any questions about your discharge instructions: Diet   No. Medications  No. Follow up visit  No.  Do you have questions or concerns about your Care? No.  Actions: * If pain score is 4 or above: No action needed, pain <4.  1. Have you developed a fever since your procedure? no  2.   Have you had an respiratory symptoms (SOB or cough) since your procedure? no  3.   Have you tested positive for COVID 19 since your procedure no  4.   Have you had any family members/close contacts diagnosed with the COVID 19 since your procedure?  no   If yes to any of these questions please route to Joylene John, RN and Joella Prince, RN

## 2020-04-10 ENCOUNTER — Other Ambulatory Visit: Payer: Self-pay

## 2020-04-10 DIAGNOSIS — A048 Other specified bacterial intestinal infections: Secondary | ICD-10-CM

## 2020-04-10 MED ORDER — OMEPRAZOLE 20 MG PO CPDR: 20.0000 mg | DELAYED_RELEASE_CAPSULE | Freq: Two times a day (BID) | ORAL | 0 refills | Status: DC

## 2020-04-10 MED ORDER — BISMUTH SUBSALICYLATE 262 MG PO CHEW: 524.0000 mg | CHEWABLE_TABLET | Freq: Four times a day (QID) | ORAL | 0 refills | Status: AC

## 2020-04-10 MED ORDER — METRONIDAZOLE 250 MG PO TABS: 250.0000 mg | ORAL_TABLET | Freq: Four times a day (QID) | ORAL | 0 refills | Status: AC

## 2020-04-10 MED ORDER — DOXYCYCLINE HYCLATE 100 MG PO CAPS: 100.0000 mg | ORAL_CAPSULE | Freq: Two times a day (BID) | ORAL | 0 refills | Status: AC

## 2020-04-13 ENCOUNTER — Telehealth: Payer: Self-pay

## 2020-04-13 NOTE — Telephone Encounter (Signed)
Patient came to the office with a male friend. He does not understand the letter received regarding his biopsy results.  Printed his result letter from Barb Merino, RN We reviewed this letter together. I explained the infection of H Pylori, the treatment and the follow up testing. Questions invited and answered to his satisfaction.

## 2020-04-24 ENCOUNTER — Other Ambulatory Visit: Payer: Self-pay | Admitting: Gastroenterology

## 2020-04-24 DIAGNOSIS — A048 Other specified bacterial intestinal infections: Secondary | ICD-10-CM

## 2020-04-28 ENCOUNTER — Encounter: Payer: Self-pay | Admitting: Internal Medicine

## 2020-05-21 ENCOUNTER — Encounter: Payer: Self-pay | Admitting: Internal Medicine

## 2020-05-21 ENCOUNTER — Other Ambulatory Visit: Payer: Self-pay

## 2020-05-21 ENCOUNTER — Ambulatory Visit (INDEPENDENT_AMBULATORY_CARE_PROVIDER_SITE_OTHER): Payer: Self-pay | Admitting: Internal Medicine

## 2020-05-21 VITALS — BP 108/76 | HR 88 | Temp 98.6°F | Ht 64.0 in | Wt 134.0 lb

## 2020-05-21 DIAGNOSIS — Z113 Encounter for screening for infections with a predominantly sexual mode of transmission: Secondary | ICD-10-CM

## 2020-05-21 DIAGNOSIS — B20 Human immunodeficiency virus [HIV] disease: Secondary | ICD-10-CM

## 2020-05-21 DIAGNOSIS — Z23 Encounter for immunization: Secondary | ICD-10-CM

## 2020-05-21 DIAGNOSIS — A048 Other specified bacterial intestinal infections: Secondary | ICD-10-CM

## 2020-05-21 NOTE — Addendum Note (Signed)
Addended by: Caffie Pinto on: 05/21/2020 03:51 PM   Modules accepted: Orders
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
## 2020-05-21 NOTE — Patient Instructions (Signed)
You are doing very well with your hiv  Please call your pharmacy and see if they would mail the prescription. Otherwise you'll have to pickup monthly   Please do you blood tests today (syphilis, gonorrhea, hiv testing)  Please follow up with your gastroenterologist to test for cure of h-pylori infection  On our next visit in 3 months, will consider vaccination for meningitis and shingle

## 2020-05-25 ENCOUNTER — Telehealth: Payer: Self-pay

## 2020-05-25 LAB — CBC
HCT: 37.5 % — ABNORMAL LOW (ref 38.5–50.0)
Hemoglobin: 13.1 g/dL — ABNORMAL LOW (ref 13.2–17.1)
MCH: 33.4 pg — ABNORMAL HIGH (ref 27.0–33.0)
MCHC: 34.9 g/dL (ref 32.0–36.0)
MCV: 95.7 fL (ref 80.0–100.0)
MPV: 8.9 fL (ref 7.5–12.5)
Platelets: 210 10*3/uL (ref 140–400)
RBC: 3.92 10*6/uL — ABNORMAL LOW (ref 4.20–5.80)
RDW: 12.9 % (ref 11.0–15.0)
WBC: 4.8 10*3/uL (ref 3.8–10.8)

## 2020-05-25 LAB — COMPLETE METABOLIC PANEL WITH GFR
AG Ratio: 1.6 (calc) (ref 1.0–2.5)
ALT: 14 U/L (ref 9–46)
AST: 20 U/L (ref 10–35)
Albumin: 4.2 g/dL (ref 3.6–5.1)
Alkaline phosphatase (APISO): 49 U/L (ref 35–144)
BUN: 15 mg/dL (ref 7–25)
CO2: 31 mmol/L (ref 20–32)
Calcium: 9 mg/dL (ref 8.6–10.3)
Chloride: 108 mmol/L (ref 98–110)
Creat: 0.82 mg/dL (ref 0.70–1.33)
GFR, Est African American: 112 mL/min/{1.73_m2} (ref >=60)
GFR, Est Non African American: 97 mL/min/{1.73_m2} (ref >=60)
Globulin: 2.7 g/dL (calc) (ref 1.9–3.7)
Glucose, Bld: 99 mg/dL (ref 65–99)
Potassium: 4.1 mmol/L (ref 3.5–5.3)
Sodium: 142 mmol/L (ref 135–146)
Total Bilirubin: 0.3 mg/dL (ref 0.2–1.2)
Total Protein: 6.9 g/dL (ref 6.1–8.1)

## 2020-05-25 LAB — HIV-1 RNA QUANT-NO REFLEX-BLD
HIV 1 RNA Quant: 28 Copies/mL — ABNORMAL HIGH
HIV-1 RNA Quant, Log: 1.44 Log cps/mL — ABNORMAL HIGH

## 2020-05-25 LAB — RPR: RPR Ser Ql: REACTIVE — AB

## 2020-05-25 LAB — FLUORESCENT TREPONEMAL AB(FTA)-IGG-BLD: Fluorescent Treponemal ABS: REACTIVE — AB

## 2020-05-25 LAB — RPR TITER: RPR Titer: 1:2 {titer} — ABNORMAL HIGH

## 2020-05-25 NOTE — Telephone Encounter (Signed)
Contacted patient to review lab work. Left HIPAA compliant voicemail requesting call back.   Ahaana Rochette Lorita Officer, RN

## 2020-05-25 NOTE — Telephone Encounter (Signed)
-----   Message from Jabier Mutton, MD sent at 05/25/2020  1:49 PM EDT ----- Please let patient know his hiv is currently controlled and syphilis appeared treated without evidence reinfection   F/u with me 3 months  Please ask him to signup for mychart as well  thanks

## 2020-05-26 NOTE — Telephone Encounter (Signed)
Attempt #2 to discuss labs and remind patient of appointment in 3 months.

## 2020-08-12 ENCOUNTER — Other Ambulatory Visit: Payer: Self-pay | Admitting: Internal Medicine

## 2020-08-12 ENCOUNTER — Ambulatory Visit: Payer: Self-pay | Admitting: Internal Medicine

## 2020-08-12 DIAGNOSIS — B20 Human immunodeficiency virus [HIV] disease: Secondary | ICD-10-CM

## 2020-09-18 ENCOUNTER — Telehealth: Payer: Self-pay

## 2020-09-18 NOTE — Telephone Encounter (Signed)
[facility] Patient Advocate Encounter  Completed and sent [employer] Advancing Access application for BIKTARVY for this patient who is uninsured.    Patient is approved 03/12/20 through 03/12/21.  BIN      [id number] PCN    [id number] [id number]    [id number] ID        [id number]   Ileene Patrick, CPhT [facility] Patient [facility] Phone: [phone] Fax:  [phone]

## 2020-09-23 ENCOUNTER — Ambulatory Visit: Payer: Self-pay

## 2020-09-23 ENCOUNTER — Other Ambulatory Visit: Payer: Self-pay

## 2020-09-23 ENCOUNTER — Encounter: Payer: Self-pay | Admitting: Internal Medicine

## 2020-09-23 ENCOUNTER — Ambulatory Visit (INDEPENDENT_AMBULATORY_CARE_PROVIDER_SITE_OTHER): Payer: Self-pay | Admitting: Internal Medicine

## 2020-09-23 VITALS — BP 126/83 | HR 86 | Temp 98.1°F | Wt 133.0 lb

## 2020-09-23 DIAGNOSIS — Z9189 Other specified personal risk factors, not elsewhere classified: Secondary | ICD-10-CM

## 2020-09-23 DIAGNOSIS — Z23 Encounter for immunization: Secondary | ICD-10-CM

## 2020-09-23 DIAGNOSIS — B20 Human immunodeficiency virus [HIV] disease: Secondary | ICD-10-CM

## 2020-09-23 DIAGNOSIS — Z1159 Encounter for screening for other viral diseases: Secondary | ICD-10-CM

## 2020-09-23 DIAGNOSIS — Z113 Encounter for screening for infections with a predominantly sexual mode of transmission: Secondary | ICD-10-CM

## 2020-09-23 MED ORDER — BICTEGRAVIR-EMTRICITAB-TENOFOV 50-200-25 MG PO TABS: 1.0000 | ORAL_TABLET | Freq: Every day | ORAL | 11 refills | Status: DC

## 2020-09-23 NOTE — Patient Instructions (Addendum)
If you have any issue with getting your medication, please call us immediately or just show up at our clinic    Pick up your biktarvy today at [facility] on [address]  Bloodtests and urine tests today   Follow up with [place] in 1 month   You will also get your meningococcal vaccine booster today
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
## 2020-09-23 NOTE — Addendum Note (Signed)
Addended by: Tomi Bamberger on: 09/23/2020 04:37 PM   Modules accepted: Orders

## 2020-09-25 ENCOUNTER — Encounter: Payer: Self-pay | Admitting: Infectious Disease

## 2020-09-25 LAB — CBC WITH DIFFERENTIAL/PLATELET
Absolute Monocytes: 495 cells/uL (ref 200–950)
Basophils Absolute: 29 cells/uL (ref 0–200)
Basophils Relative: 0.6 %
Eosinophils Absolute: 0 cells/uL — ABNORMAL LOW (ref 15–500)
Eosinophils Relative: 0 %
HCT: 43.2 % (ref 38.5–50.0)
Hemoglobin: 14.7 g/dL (ref 13.2–17.1)
Lymphs Abs: 1764 cells/uL (ref 850–3900)
MCH: 31.7 pg (ref 27.0–33.0)
MCHC: 34 g/dL (ref 32.0–36.0)
MCV: 93.1 fL (ref 80.0–100.0)
MPV: 9.1 fL (ref 7.5–12.5)
Monocytes Relative: 10.1 %
Neutro Abs: 2612 cells/uL (ref 1500–7800)
Neutrophils Relative %: 53.3 %
Platelets: 177 10*3/uL (ref 140–400)
RBC: 4.64 10*6/uL (ref 4.20–5.80)
RDW: 12.7 % (ref 11.0–15.0)
Total Lymphocyte: 36 %
WBC: 4.9 10*3/uL (ref 3.8–10.8)

## 2020-09-25 LAB — HEPATITIS C ANTIBODY
Hepatitis C Ab: NONREACTIVE
SIGNAL TO CUT-OFF: 0.08 (ref <1.00)

## 2020-09-25 LAB — RPR TITER: RPR Titer: 1:2 {titer} — ABNORMAL HIGH

## 2020-09-25 LAB — FLUORESCENT TREPONEMAL AB(FTA)-IGG-BLD: Fluorescent Treponemal ABS: REACTIVE — AB

## 2020-09-25 LAB — URINE CYTOLOGY ANCILLARY ONLY
Chlamydia: NEGATIVE
Comment: NEGATIVE
Comment: NORMAL
Neisseria Gonorrhea: NEGATIVE

## 2020-09-25 LAB — T-HELPER CELLS (CD4) COUNT (NOT AT ARMC)
CD4 % Helper T Cell: 21 % — ABNORMAL LOW (ref 33–65)
CD4 T Cell Abs: 318 /uL — ABNORMAL LOW (ref 400–1790)

## 2020-09-25 LAB — HIV-1 RNA QUANT-NO REFLEX-BLD
HIV 1 RNA Quant: 16300 Copies/mL — ABNORMAL HIGH
HIV-1 RNA Quant, Log: 4.21 Log cps/mL — ABNORMAL HIGH

## 2020-09-25 LAB — RPR: RPR Ser Ql: REACTIVE — AB

## 2020-09-28 ENCOUNTER — Telehealth: Payer: Self-pay

## 2020-09-28 NOTE — Telephone Encounter (Signed)
Spoke with patient, advised him that syphilis titer is stable but that his HIV viral load has increased to over 16,000. He states he has been able to restart his Biktarvy. RN encouraged adherence and asked him to please call us if he has any trouble getting his medications. Reminded him of upcoming follow up with Dr. Tommy Medal. Patient verbalized understanding and has no further questions.   Beryle Flock, RN

## 2020-09-28 NOTE — Telephone Encounter (Signed)
-----   Message from Jabier Mutton, MD sent at 09/28/2020  9:52 AM EDT ----- Hi team, could you let this patient know to take his hiv meds. His syphilis titer is stable and was previously treated. He just got off hiv meds so viral load is high  Please make sure he has appointment in around 3-4 weeks with me. Thanks

## 2020-10-22 ENCOUNTER — Ambulatory Visit: Payer: Self-pay | Admitting: Infectious Disease

## 2020-10-23 ENCOUNTER — Encounter: Payer: Self-pay | Admitting: Infectious Disease

## 2020-10-23 ENCOUNTER — Other Ambulatory Visit: Payer: Self-pay

## 2020-10-23 ENCOUNTER — Ambulatory Visit (INDEPENDENT_AMBULATORY_CARE_PROVIDER_SITE_OTHER): Payer: Self-pay | Admitting: Infectious Disease

## 2020-10-23 VITALS — BP 118/83 | HR 87 | Temp 98.6°F | Ht 64.0 in | Wt 135.0 lb

## 2020-10-23 DIAGNOSIS — B20 Human immunodeficiency virus [HIV] disease: Secondary | ICD-10-CM

## 2020-10-23 DIAGNOSIS — A048 Other specified bacterial intestinal infections: Secondary | ICD-10-CM | POA: Insufficient documentation

## 2020-10-23 DIAGNOSIS — B009 Herpesviral infection, unspecified: Secondary | ICD-10-CM

## 2020-10-23 DIAGNOSIS — F17200 Nicotine dependence, unspecified, uncomplicated: Secondary | ICD-10-CM

## 2020-10-23 HISTORY — DX: Other specified bacterial intestinal infections: A04.8
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
## 2020-10-27 LAB — CBC WITH DIFFERENTIAL/PLATELET
Absolute Monocytes: 378 cells/uL (ref 200–950)
Basophils Absolute: 57 cells/uL (ref 0–200)
Basophils Relative: 0.9 %
Eosinophils Absolute: 82 cells/uL (ref 15–500)
Eosinophils Relative: 1.3 %
HCT: 40.4 % (ref 38.5–50.0)
Hemoglobin: 14.3 g/dL (ref 13.2–17.1)
Lymphs Abs: 2010 cells/uL (ref 850–3900)
MCH: 33.1 pg — ABNORMAL HIGH (ref 27.0–33.0)
MCHC: 35.4 g/dL (ref 32.0–36.0)
MCV: 93.5 fL (ref 80.0–100.0)
MPV: 9.1 fL (ref 7.5–12.5)
Monocytes Relative: 6 %
Neutro Abs: 3774 cells/uL (ref 1500–7800)
Neutrophils Relative %: 59.9 %
Platelets: 206 10*3/uL (ref 140–400)
RBC: 4.32 10*6/uL (ref 4.20–5.80)
RDW: 12.9 % (ref 11.0–15.0)
Total Lymphocyte: 31.9 %
WBC: 6.3 10*3/uL (ref 3.8–10.8)

## 2020-10-27 LAB — COMPLETE METABOLIC PANEL WITH GFR
AG Ratio: 1.1 (calc) (ref 1.0–2.5)
ALT: 29 U/L (ref 9–46)
AST: 38 U/L — ABNORMAL HIGH (ref 10–35)
Albumin: 4.4 g/dL (ref 3.6–5.1)
Alkaline phosphatase (APISO): 67 U/L (ref 35–144)
BUN: 14 mg/dL (ref 7–25)
CO2: 26 mmol/L (ref 20–32)
Calcium: 9.4 mg/dL (ref 8.6–10.3)
Chloride: 104 mmol/L (ref 98–110)
Creat: 0.92 mg/dL (ref 0.70–1.30)
Globulin: 3.9 g/dL (calc) — ABNORMAL HIGH (ref 1.9–3.7)
Glucose, Bld: 80 mg/dL (ref 65–99)
Potassium: 4.4 mmol/L (ref 3.5–5.3)
Sodium: 139 mmol/L (ref 135–146)
Total Bilirubin: 0.3 mg/dL (ref 0.2–1.2)
Total Protein: 8.3 g/dL — ABNORMAL HIGH (ref 6.1–8.1)
eGFR: 96 mL/min/{1.73_m2} (ref >=60)

## 2020-10-27 LAB — T-HELPER CELLS (CD4) COUNT (NOT AT ARMC)
Absolute CD4: 363 cells/uL — ABNORMAL LOW (ref 490–1740)
CD4 T Helper %: 25 % — ABNORMAL LOW (ref 30–61)
Total lymphocyte count: 1437 cells/uL (ref 850–3900)

## 2020-10-27 LAB — RPR: RPR Ser Ql: REACTIVE — AB

## 2020-10-27 LAB — HIV-1 RNA QUANT-NO REFLEX-BLD
HIV 1 RNA Quant: <20 Copies/mL — ABNORMAL HIGH
HIV-1 RNA Quant, Log: <1.3 Log cps/mL — ABNORMAL HIGH

## 2020-10-27 LAB — FLUORESCENT TREPONEMAL AB(FTA)-IGG-BLD: Fluorescent Treponemal ABS: REACTIVE — AB

## 2020-10-27 LAB — RPR TITER: RPR Titer: 1:2 {titer} — ABNORMAL HIGH

## 2020-12-23 ENCOUNTER — Encounter: Payer: Self-pay | Admitting: Internal Medicine

## 2020-12-23 ENCOUNTER — Ambulatory Visit (INDEPENDENT_AMBULATORY_CARE_PROVIDER_SITE_OTHER): Payer: Self-pay

## 2020-12-23 ENCOUNTER — Other Ambulatory Visit: Payer: Self-pay

## 2020-12-23 ENCOUNTER — Ambulatory Visit (INDEPENDENT_AMBULATORY_CARE_PROVIDER_SITE_OTHER): Payer: Self-pay | Admitting: Internal Medicine

## 2020-12-23 VITALS — BP 155/113 | HR 76 | Temp 98.2°F | Wt 141.0 lb

## 2020-12-23 DIAGNOSIS — Z23 Encounter for immunization: Secondary | ICD-10-CM

## 2020-12-23 DIAGNOSIS — B20 Human immunodeficiency virus [HIV] disease: Secondary | ICD-10-CM
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
## 2020-12-23 NOTE — Progress Notes (Signed)
   Covid-19 Vaccination Clinic  Name:  Christopher Farrell    MRN: [id number] DOB: [birth year]  12/23/2020  Mr. Recore was observed post Covid-19 immunization for 15 minutes without incident. He was provided with Vaccine Information Sheet and instruction to access the V-Safe system.   Mr. Imperato was instructed to call 911 with any severe reactions post vaccine: Difficulty breathing  Swelling of face and throat  A fast heartbeat  A bad rash all over body  Dizziness and weakness   Immunizations Administered     Name Date Dose VIS Date Route   Pfizer Covid-19 Vaccine Bivalent Booster 12/23/2020  4:40 PM 0.3 mL 11/04/2020 Intramuscular   Manufacturer: [place]   Lot: [place]   NDC: [place]

## 2020-12-23 NOTE — Patient Instructions (Signed)
Please call [phone] for assistance Take biktravy 1 tab daily

## 2020-12-24 ENCOUNTER — Other Ambulatory Visit: Payer: Self-pay | Admitting: Pharmacist

## 2020-12-24 ENCOUNTER — Ambulatory Visit: Payer: Self-pay

## 2020-12-24 DIAGNOSIS — B20 Human immunodeficiency virus [HIV] disease: Secondary | ICD-10-CM

## 2020-12-24 LAB — URINALYSIS
Bilirubin Urine: NEGATIVE
Glucose, UA: NEGATIVE
Hgb urine dipstick: NEGATIVE
Leukocytes,Ua: NEGATIVE
Nitrite: NEGATIVE
Protein, ur: NEGATIVE
Specific Gravity, Urine: 1.016 (ref 1.001–1.035)
pH: 6 (ref 5.0–8.0)

## 2020-12-24 LAB — T-HELPER CELL (CD4) - (RCID CLINIC ONLY)
CD4 % Helper T Cell: 30 % — ABNORMAL LOW (ref 33–65)
CD4 T Cell Abs: 781 /uL (ref 400–1790)

## 2020-12-24 MED ORDER — BIKTARVY 50-200-25 MG PO TABS: 1.0000 | ORAL_TABLET | Freq: Every day | ORAL | 0 refills | Status: DC

## 2020-12-24 NOTE — Progress Notes (Signed)
Medication Samples have been provided to the patient.  Drug name: Biktarvy        Strength: 50/200/25 mg       Qty: 14 tablets (2 bottles)   LOT: [id number]   Exp.Date: 12/06/2022  Dosing instructions: Take one tablet by mouth once daily  The patient has been instructed regarding the correct time, dose, and frequency of taking this medication, including desired effects and most common side effects.   Ryett Hamman L. Eber Hong, PharmD, BCIDP, AAHIVP, CPP Clinical Pharmacist Practitioner Infectious Diseases [facility] 02/17/2020, 10:07 AM

## 2020-12-25 LAB — URINE CYTOLOGY ANCILLARY ONLY
Chlamydia: NEGATIVE
Comment: NEGATIVE
Comment: NORMAL
Neisseria Gonorrhea: NEGATIVE

## 2020-12-25 LAB — CYTOLOGY, (ORAL, ANAL, URETHRAL) ANCILLARY ONLY
Chlamydia: NEGATIVE
Comment: NEGATIVE
Comment: NORMAL
Neisseria Gonorrhea: NEGATIVE

## 2020-12-27 LAB — CBC WITH DIFFERENTIAL/PLATELET
Absolute Monocytes: 324 cells/uL (ref 200–950)
Basophils Absolute: 48 cells/uL (ref 0–200)
Basophils Relative: 0.8 %
Eosinophils Absolute: 42 cells/uL (ref 15–500)
Eosinophils Relative: 0.7 %
HCT: 38.2 % — ABNORMAL LOW (ref 38.5–50.0)
Hemoglobin: 13.9 g/dL (ref 13.2–17.1)
Lymphs Abs: 2502 cells/uL (ref 850–3900)
MCH: 34 pg — ABNORMAL HIGH (ref 27.0–33.0)
MCHC: 36.4 g/dL — ABNORMAL HIGH (ref 32.0–36.0)
MCV: 93.4 fL (ref 80.0–100.0)
MPV: 9.7 fL (ref 7.5–12.5)
Monocytes Relative: 5.4 %
Neutro Abs: 3084 cells/uL (ref 1500–7800)
Neutrophils Relative %: 51.4 %
Platelets: 217 10*3/uL (ref 140–400)
RBC: 4.09 10*6/uL — ABNORMAL LOW (ref 4.20–5.80)
RDW: 12.6 % (ref 11.0–15.0)
Total Lymphocyte: 41.7 %
WBC: 6 10*3/uL (ref 3.8–10.8)

## 2020-12-27 LAB — COMPLETE METABOLIC PANEL WITH GFR
AG Ratio: 1.3 (calc) (ref 1.0–2.5)
ALT: 12 U/L (ref 9–46)
AST: 20 U/L (ref 10–35)
Albumin: 4.2 g/dL (ref 3.6–5.1)
Alkaline phosphatase (APISO): 55 U/L (ref 35–144)
BUN: 8 mg/dL (ref 7–25)
CO2: 26 mmol/L (ref 20–32)
Calcium: 9.3 mg/dL (ref 8.6–10.3)
Chloride: 102 mmol/L (ref 98–110)
Creat: 0.96 mg/dL (ref 0.70–1.35)
Globulin: 3.3 g/dL (calc) (ref 1.9–3.7)
Glucose, Bld: 95 mg/dL (ref 65–99)
Potassium: 4 mmol/L (ref 3.5–5.3)
Sodium: 139 mmol/L (ref 135–146)
Total Bilirubin: 0.4 mg/dL (ref 0.2–1.2)
Total Protein: 7.5 g/dL (ref 6.1–8.1)
eGFR: 90 mL/min/{1.73_m2} (ref >=60)

## 2020-12-27 LAB — HEPATITIS C ANTIBODY
Hepatitis C Ab: NONREACTIVE
SIGNAL TO CUT-OFF: 0.16 (ref <1.00)

## 2020-12-27 LAB — QUANTIFERON-TB GOLD PLUS
Mitogen-NIL: >10 IU/mL
NIL: 0.04 IU/mL
QuantiFERON-TB Gold Plus: NEGATIVE
TB1-NIL: 0 IU/mL
TB2-NIL: 0 IU/mL

## 2020-12-27 LAB — LIPID PANEL
Cholesterol: 141 mg/dL (ref <200)
HDL: 71 mg/dL (ref >=40)
LDL Cholesterol (Calc): 57 mg/dL (calc)
Non-HDL Cholesterol (Calc): 70 mg/dL (calc) (ref <130)
Total CHOL/HDL Ratio: 2 (calc) (ref <5.0)
Triglycerides: 52 mg/dL (ref <150)

## 2020-12-27 LAB — HIV-1 RNA ULTRAQUANT REFLEX TO GENTYP+
HIV 1 RNA Quant: 79 copies/mL — ABNORMAL HIGH
HIV-1 RNA Quant, Log: 1.9 Log copies/mL — ABNORMAL HIGH

## 2020-12-27 LAB — RPR TITER: RPR Titer: 1:1 {titer} — ABNORMAL HIGH

## 2020-12-27 LAB — FLUORESCENT TREPONEMAL AB(FTA)-IGG-BLD: Fluorescent Treponemal ABS: REACTIVE — AB

## 2020-12-27 LAB — RPR: RPR Ser Ql: REACTIVE — AB

## 2020-12-28 ENCOUNTER — Telehealth: Payer: Self-pay

## 2020-12-28 NOTE — Telephone Encounter (Signed)
Relayed viral load of 79 to patient, explained that it is likely due to lapse without medication. Emphasized importance of taking Biktarvy daily. Patient verbalized understanding and has no further questions.   Beryle Flock, RN

## 2020-12-28 NOTE — Telephone Encounter (Signed)
-----   Message from Laurice Record, MD sent at 12/28/2020  7:10 AM EDT ----- Viral load 79, likely reflects not having Biktarvy for 2 weeks. Continue Biktarvy.

## 2021-01-06 ENCOUNTER — Ambulatory Visit (INDEPENDENT_AMBULATORY_CARE_PROVIDER_SITE_OTHER): Payer: Self-pay | Admitting: Internal Medicine

## 2021-01-06 ENCOUNTER — Encounter: Payer: Self-pay | Admitting: Internal Medicine

## 2021-01-06 ENCOUNTER — Other Ambulatory Visit: Payer: Self-pay

## 2021-01-06 VITALS — BP 144/90 | HR 77 | Temp 98.3°F | Wt 140.0 lb

## 2021-01-06 DIAGNOSIS — B20 Human immunodeficiency virus [HIV] disease: Secondary | ICD-10-CM

## 2021-01-06 MED ORDER — BICTEGRAVIR-EMTRICITAB-TENOFOV 50-200-25 MG PO TABS: 1.0000 | ORAL_TABLET | Freq: Every day | ORAL | 5 refills | Status: DC

## 2021-01-06 MED ORDER — NICOTINE POLACRILEX 4 MG MT GUM: 4.0000 mg | CHEWING_GUM | OROMUCOSAL | 0 refills | Status: DC | PRN
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
## 2021-01-07 LAB — T-HELPER CELL (CD4) - (RCID CLINIC ONLY)
CD4 % Helper T Cell: 22 % — ABNORMAL LOW (ref 33–65)
CD4 T Cell Abs: 627 /uL (ref 400–1790)

## 2021-01-10 LAB — HIV-1 RNA ULTRAQUANT REFLEX TO GENTYP+
HIV 1 RNA Quant: <20 copies/mL — AB
HIV-1 RNA Quant, Log: <1.3 Log copies/mL — AB

## 2021-01-22 ENCOUNTER — Telehealth: Payer: Self-pay

## 2021-01-22 NOTE — Telephone Encounter (Signed)
-----   Message from Laurice Record, MD sent at 01/22/2021 12:49 PM EST ----- Stable labs, Vl non-detectable. Continue biktarvy.

## 2021-01-22 NOTE — Telephone Encounter (Signed)
Called patient to relay results, call could not be completed.   Beryle Flock, RN

## 2021-01-26 NOTE — Telephone Encounter (Signed)
Attempted to call patient again, call cannot be completed.   Beryle Flock, RN

## 2021-06-22 ENCOUNTER — Ambulatory Visit: Payer: Self-pay

## 2021-06-22 ENCOUNTER — Other Ambulatory Visit: Payer: Self-pay

## 2021-06-24 ENCOUNTER — Other Ambulatory Visit: Payer: Self-pay | Admitting: Pharmacist

## 2021-06-24 DIAGNOSIS — B20 Human immunodeficiency virus [HIV] disease: Secondary | ICD-10-CM

## 2021-06-24 MED ORDER — BICTEGRAVIR-EMTRICITAB-TENOFOV 50-200-25 MG PO TABS: 1.0000 | ORAL_TABLET | Freq: Every day | ORAL | 0 refills | Status: DC

## 2021-06-24 NOTE — Progress Notes (Signed)
Medication Samples have been provided to the patient. ? ?Drug name: Biktarvy        ?Strength: 50/200/25 mg       ?Qty: 7 tablets (2 bottles) ?LOT: [id number]   ?Exp.Date: 10/24 ? ?Dosing instructions: Take one tablet by mouth once daily ? ?The patient has been instructed regarding the correct time, dose, and frequency of taking this medication, including desired effects and most common side effects.  ? ?Alfonse Spruce, PharmD, CPP ?Clinical Pharmacist Practitioner ?Infectious Diseases Clinical Pharmacist ?[facility]  ?

## 2021-06-29 ENCOUNTER — Other Ambulatory Visit: Payer: Self-pay

## 2021-06-29 DIAGNOSIS — Z79899 Other long term (current) drug therapy: Secondary | ICD-10-CM

## 2021-06-29 DIAGNOSIS — Z113 Encounter for screening for infections with a predominantly sexual mode of transmission: Secondary | ICD-10-CM

## 2021-06-29 DIAGNOSIS — B20 Human immunodeficiency virus [HIV] disease: Secondary | ICD-10-CM

## 2021-06-30 LAB — URINE CYTOLOGY ANCILLARY ONLY
Chlamydia: NEGATIVE
Comment: NEGATIVE
Comment: NORMAL
Neisseria Gonorrhea: NEGATIVE

## 2021-07-01 LAB — RPR TITER: RPR Titer: 1:1 {titer} — ABNORMAL HIGH

## 2021-07-01 LAB — HIV-1 RNA QUANT-NO REFLEX-BLD
HIV 1 RNA Quant: 45 copies/mL — ABNORMAL HIGH
HIV-1 RNA Quant, Log: 1.65 Log copies/mL — ABNORMAL HIGH

## 2021-07-01 LAB — COMPLETE METABOLIC PANEL WITH GFR
AG Ratio: 1.5 (calc) (ref 1.0–2.5)
ALT: 12 U/L (ref 9–46)
AST: 19 U/L (ref 10–35)
Albumin: 4.3 g/dL (ref 3.6–5.1)
Alkaline phosphatase (APISO): 49 U/L (ref 35–144)
BUN: 12 mg/dL (ref 7–25)
CO2: 29 mmol/L (ref 20–32)
Calcium: 9.2 mg/dL (ref 8.6–10.3)
Chloride: 104 mmol/L (ref 98–110)
Creat: 0.91 mg/dL (ref 0.70–1.35)
Globulin: 2.9 g/dL (calc) (ref 1.9–3.7)
Glucose, Bld: 130 mg/dL — ABNORMAL HIGH (ref 65–99)
Potassium: 4.1 mmol/L (ref 3.5–5.3)
Sodium: 140 mmol/L (ref 135–146)
Total Bilirubin: 0.6 mg/dL (ref 0.2–1.2)
Total Protein: 7.2 g/dL (ref 6.1–8.1)
eGFR: 96 mL/min/{1.73_m2} (ref >=60)

## 2021-07-01 LAB — HELPER T-LYMPH-CD4 (ARMC ONLY)
% CD 4 Pos. Lymph.: 27.9 % — ABNORMAL LOW (ref 30.8–58.5)
Absolute CD 4 Helper: 474 /uL (ref 359–1519)
Basophils Absolute: 0.1 10*3/uL (ref 0.0–0.2)
Basos: 1 %
EOS (ABSOLUTE): 0 10*3/uL (ref 0.0–0.4)
Eos: 0 %
Hematocrit: 40.8 % (ref 37.5–51.0)
Hemoglobin: 14 g/dL (ref 13.0–17.7)
Immature Grans (Abs): 0 10*3/uL (ref 0.0–0.1)
Immature Granulocytes: 0 %
Lymphocytes Absolute: 1.7 10*3/uL (ref 0.7–3.1)
Lymphs: 25 %
MCH: 32.5 pg (ref 26.6–33.0)
MCHC: 34.3 g/dL (ref 31.5–35.7)
MCV: 95 fL (ref 79–97)
Monocytes Absolute: 0.3 10*3/uL (ref 0.1–0.9)
Monocytes: 4 %
Neutrophils Absolute: 4.9 10*3/uL (ref 1.4–7.0)
Neutrophils: 70 %
Platelets: 232 10*3/uL (ref 150–450)
RBC: 4.31 x10E6/uL (ref 4.14–5.80)
RDW: 12.9 % (ref 11.6–15.4)
WBC: 7 10*3/uL (ref 3.4–10.8)

## 2021-07-01 LAB — LIPID PANEL
Cholesterol: 166 mg/dL (ref <200)
HDL: 93 mg/dL (ref >=40)
LDL Cholesterol (Calc): 62 mg/dL (calc)
Non-HDL Cholesterol (Calc): 73 mg/dL (calc) (ref <130)
Total CHOL/HDL Ratio: 1.8 (calc) (ref <5.0)
Triglycerides: 39 mg/dL (ref <150)

## 2021-07-01 LAB — CBC WITH DIFFERENTIAL/PLATELET
Absolute Monocytes: 342 cells/uL (ref 200–950)
Basophils Absolute: 67 cells/uL (ref 0–200)
Basophils Relative: 1 %
Eosinophils Absolute: 20 cells/uL (ref 15–500)
Eosinophils Relative: 0.3 %
HCT: 40.7 % (ref 38.5–50.0)
Hemoglobin: 14.1 g/dL (ref 13.2–17.1)
Lymphs Abs: 1735 cells/uL (ref 850–3900)
MCH: 32.6 pg (ref 27.0–33.0)
MCHC: 34.6 g/dL (ref 32.0–36.0)
MCV: 94 fL (ref 80.0–100.0)
MPV: 9.2 fL (ref 7.5–12.5)
Monocytes Relative: 5.1 %
Neutro Abs: 4536 cells/uL (ref 1500–7800)
Neutrophils Relative %: 67.7 %
Platelets: 232 10*3/uL (ref 140–400)
RBC: 4.33 10*6/uL (ref 4.20–5.80)
RDW: 12.8 % (ref 11.0–15.0)
Total Lymphocyte: 25.9 %
WBC: 6.7 10*3/uL (ref 3.8–10.8)

## 2021-07-01 LAB — FLUORESCENT TREPONEMAL AB(FTA)-IGG-BLD: Fluorescent Treponemal ABS: REACTIVE — AB

## 2021-07-01 LAB — RPR: RPR Ser Ql: REACTIVE — AB

## 2021-07-02 LAB — T-HELPER CELL (CD4) - (RCID CLINIC ONLY)

## 2021-07-13 ENCOUNTER — Encounter: Payer: Self-pay | Admitting: Internal Medicine

## 2021-07-20 ENCOUNTER — Ambulatory Visit (INDEPENDENT_AMBULATORY_CARE_PROVIDER_SITE_OTHER): Payer: Self-pay | Admitting: Internal Medicine

## 2021-07-20 ENCOUNTER — Encounter: Payer: Self-pay | Admitting: Internal Medicine

## 2021-07-20 ENCOUNTER — Other Ambulatory Visit: Payer: Self-pay

## 2021-07-20 VITALS — BP 118/82 | HR 71 | Temp 98.1°F | Ht 65.0 in | Wt 135.0 lb

## 2021-07-20 DIAGNOSIS — B20 Human immunodeficiency virus [HIV] disease: Secondary | ICD-10-CM
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
## 2022-02-21 IMAGING — DX DG CHEST 2V
2 series · 2 of 2 positions shown · non-contrast
Comparison: PA and lateral chest 12/24/2011.

CLINICAL DATA: Difficulty swallowing and chest pain.

EXAM:
CHEST - 2 VIEW

[chest lat]
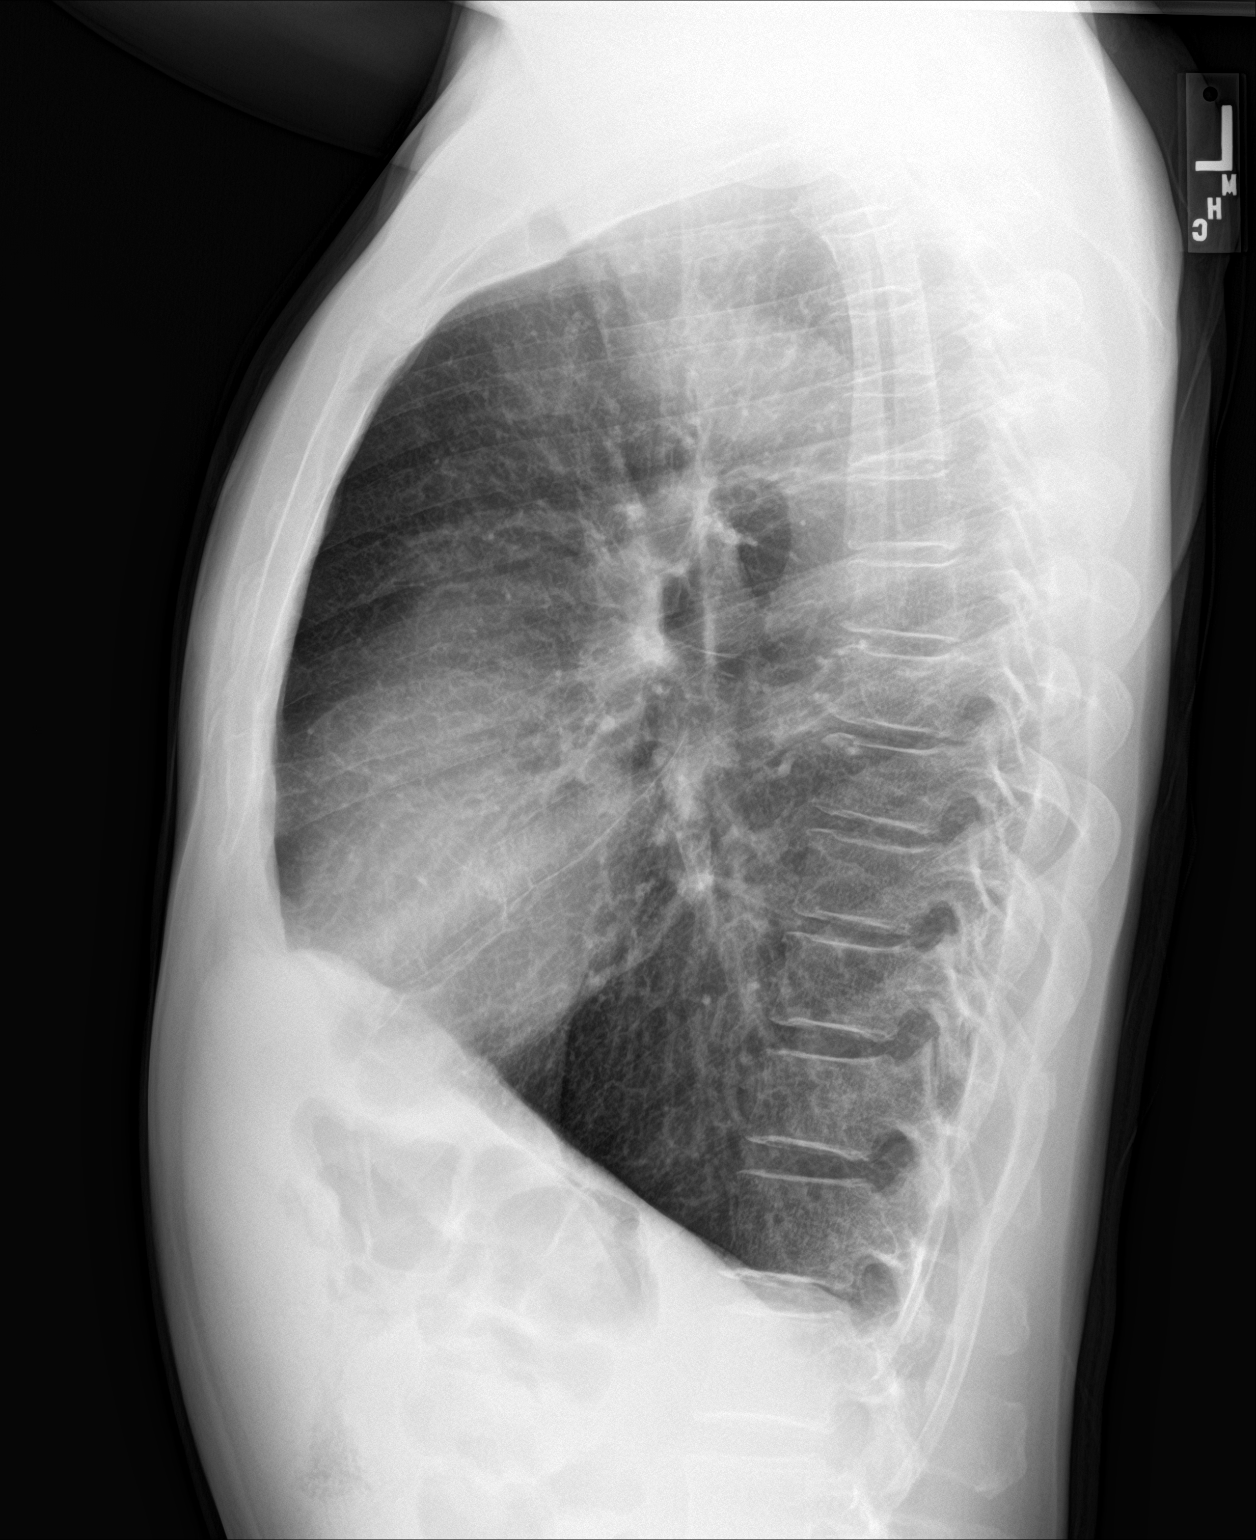

[chest pa]
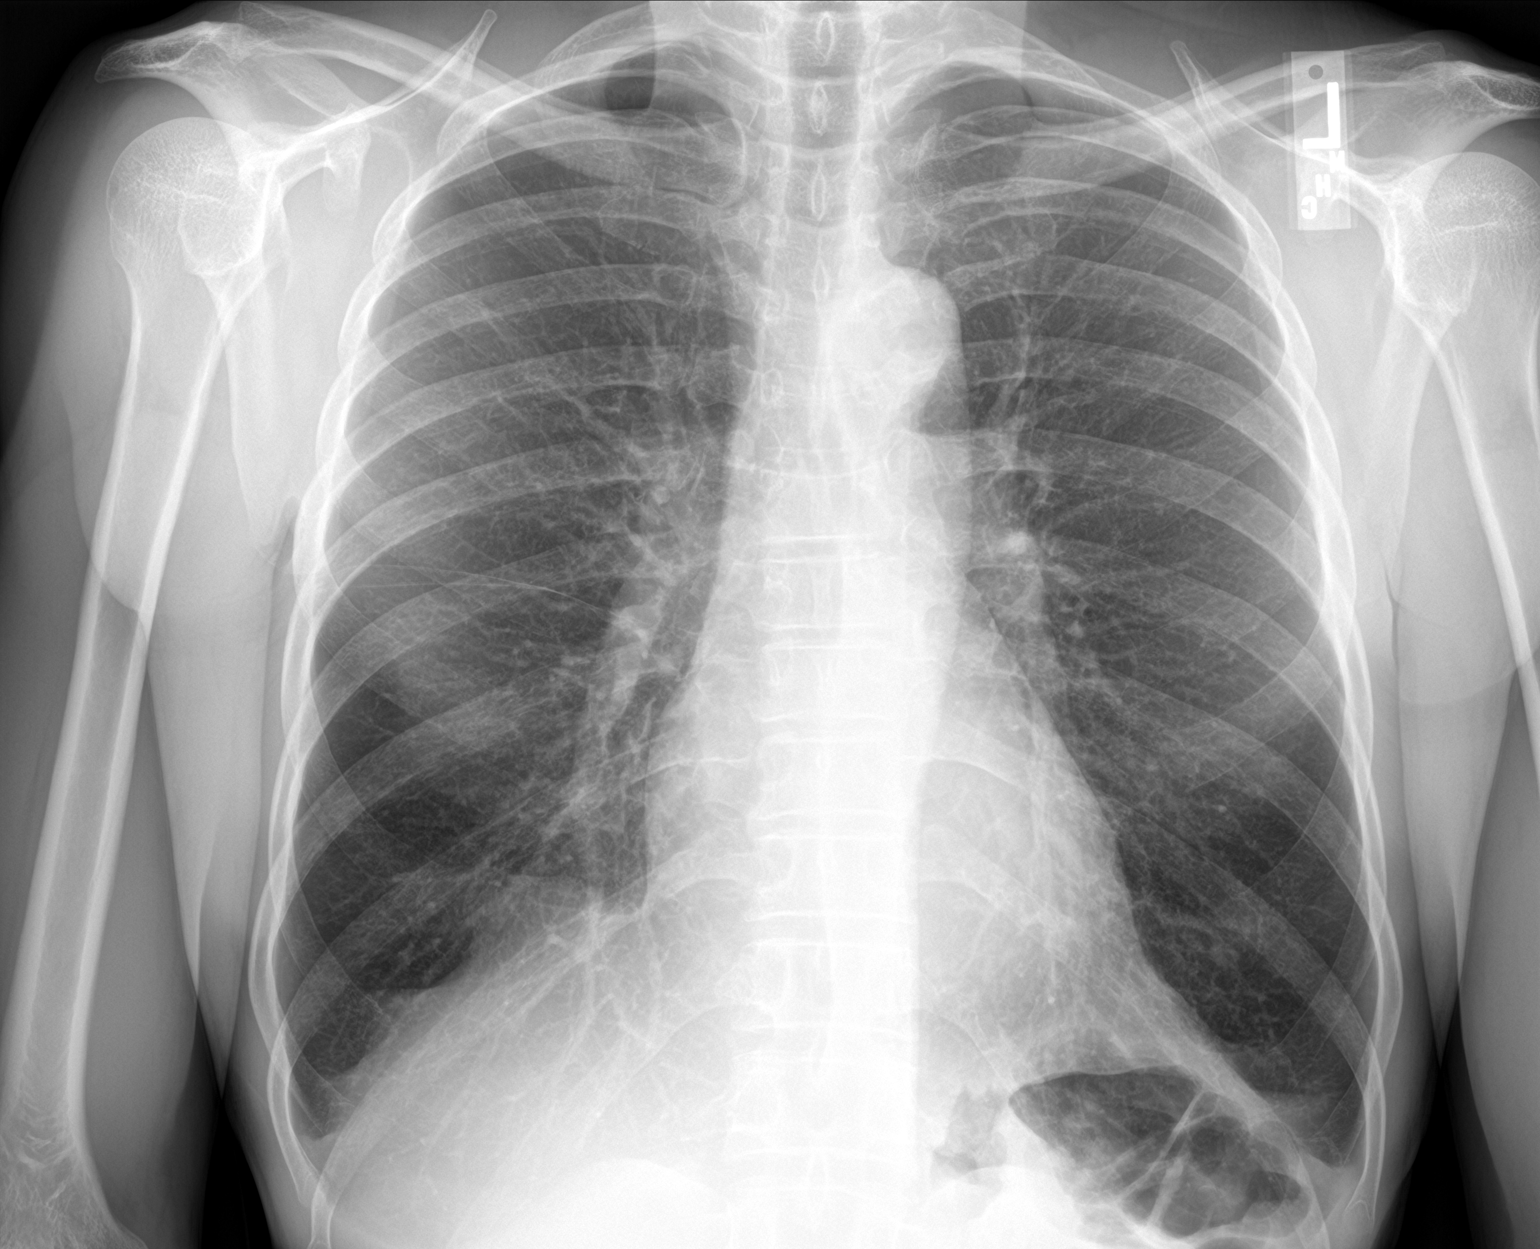

[2 of 2 positions shown; findings below may reference images not displayed]

FINDINGS: The chest is hyperexpanded. Lungs are clear. Heart size is normal.
No pneumothorax or pleural effusion. Aortic atherosclerosis noted.
No acute or focal bony abnormality.
IMPRESSION: No acute disease.

Aortic Atherosclerosis (02NEL-MIA.A) and Emphysema (02NEL-7P2.4).

## 2022-08-13 ENCOUNTER — Emergency Department (HOSPITAL_COMMUNITY)
Admission: EM | Admit: 2022-08-13 | Discharge: 2022-08-13 | Disposition: A | Discharge status: Home / Self Care | Payer: Medicaid Other | Attending: Emergency Medicine | Admitting: Emergency Medicine

## 2022-08-13 ENCOUNTER — Emergency Department (HOSPITAL_COMMUNITY): Payer: Medicaid Other

## 2022-08-13 ENCOUNTER — Other Ambulatory Visit: Payer: Self-pay

## 2022-08-13 ENCOUNTER — Encounter (HOSPITAL_COMMUNITY): Payer: Self-pay

## 2022-08-13 DIAGNOSIS — Z20822 Contact with and (suspected) exposure to covid-19: Secondary | ICD-10-CM | POA: Insufficient documentation

## 2022-08-13 DIAGNOSIS — F17210 Nicotine dependence, cigarettes, uncomplicated: Secondary | ICD-10-CM | POA: Insufficient documentation

## 2022-08-13 DIAGNOSIS — R0789 Other chest pain: Secondary | ICD-10-CM

## 2022-08-13 DIAGNOSIS — Z21 Asymptomatic human immunodeficiency virus [HIV] infection status: Secondary | ICD-10-CM | POA: Diagnosis not present

## 2022-08-13 DIAGNOSIS — R079 Chest pain, unspecified: Secondary | ICD-10-CM | POA: Diagnosis present

## 2022-08-13 DIAGNOSIS — J189 Pneumonia, unspecified organism: Secondary | ICD-10-CM | POA: Diagnosis not present

## 2022-08-13 LAB — BASIC METABOLIC PANEL
Anion gap: 15 (ref 5–15)
BUN: 14 mg/dL (ref 8–23)
CO2: 21 mmol/L — ABNORMAL LOW (ref 22–32)
Calcium: 8.4 mg/dL — ABNORMAL LOW (ref 8.9–10.3)
Chloride: 93 mmol/L — ABNORMAL LOW (ref 98–111)
Creatinine, Ser: 1.11 mg/dL (ref 0.61–1.24)
GFR, Estimated: >60 mL/min (ref >60)
Glucose, Bld: 101 mg/dL — ABNORMAL HIGH (ref 70–99)
Potassium: 4 mmol/L (ref 3.5–5.1)
Sodium: 129 mmol/L — ABNORMAL LOW (ref 135–145)

## 2022-08-13 LAB — CBC
HCT: 38.6 % — ABNORMAL LOW (ref 39.0–52.0)
Hemoglobin: 12.9 g/dL — ABNORMAL LOW (ref 13.0–17.0)
MCH: 28.7 pg (ref 26.0–34.0)
MCHC: 33.4 g/dL (ref 30.0–36.0)
MCV: 86 fL (ref 80.0–100.0)
Platelets: 268 10*3/uL (ref 150–400)
RBC: 4.49 MIL/uL (ref 4.22–5.81)
RDW: 12.8 % (ref 11.5–15.5)
WBC: 11.3 10*3/uL — ABNORMAL HIGH (ref 4.0–10.5)
nRBC: 0 % (ref 0.0–0.2)

## 2022-08-13 LAB — RESP PANEL BY RT-PCR (RSV, FLU A&B, COVID)  RVPGX2
Influenza A by PCR: NEGATIVE
Influenza B by PCR: NEGATIVE
Resp Syncytial Virus by PCR: NEGATIVE
SARS Coronavirus 2 by RT PCR: NEGATIVE

## 2022-08-13 LAB — TROPONIN I (HIGH SENSITIVITY)
Troponin I (High Sensitivity): 12 ng/L (ref <18)
Troponin I (High Sensitivity): 11 ng/L (ref <18)

## 2022-08-13 MED ORDER — CEFTRIAXONE SODIUM 1 G IJ SOLR: 1.0000 g | Freq: Once | INTRAMUSCULAR | 0 refills | Status: DC

## 2022-08-13 MED ORDER — SODIUM CHLORIDE 0.9 % IV BOLUS
1000.0000 mL | Freq: Once | INTRAVENOUS | Status: AC
  Administered 2022-08-13: 1000 mL via INTRAVENOUS

## 2022-08-13 MED ORDER — SODIUM CHLORIDE 0.9 % IV SOLN
2.0000 g | Freq: Once | INTRAVENOUS | Status: AC
  Administered 2022-08-13: 2 g via INTRAVENOUS
  Filled 2022-08-13: qty 20

## 2022-08-13 MED ORDER — AZITHROMYCIN 250 MG PO TABS: 500.0000 mg | ORAL_TABLET | Freq: Every day | ORAL | 0 refills | Status: AC

## 2022-08-13 MED ORDER — ALBUTEROL SULFATE HFA 108 (90 BASE) MCG/ACT IN AERS
1.0000 | INHALATION_SPRAY | Freq: Once | RESPIRATORY_TRACT | Status: AC
  Administered 2022-08-13: 2 via RESPIRATORY_TRACT
  Filled 2022-08-13: qty 6.7

## 2022-08-13 MED ORDER — AMOXICILLIN-POT CLAVULANATE 875-125 MG PO TABS: 1.0000 | ORAL_TABLET | Freq: Two times a day (BID) | ORAL | 0 refills | Status: AC

## 2022-08-13 MED ORDER — ALBUTEROL SULFATE (2.5 MG/3ML) 0.083% IN NEBU
15.0000 mg/h | INHALATION_SOLUTION | RESPIRATORY_TRACT | Status: AC
  Administered 2022-08-13: 15 mg/h via RESPIRATORY_TRACT
  Filled 2022-08-13: qty 18

## 2022-08-13 MED ORDER — DEXAMETHASONE SODIUM PHOSPHATE 10 MG/ML IJ SOLN
10.0000 mg | Freq: Once | INTRAMUSCULAR | Status: AC
  Administered 2022-08-13: 10 mg via INTRAMUSCULAR
  Filled 2022-08-13: qty 1

## 2022-08-13 NOTE — ED Provider Notes (Signed)
  Accepted handoff at shift change from [facility]. Please see prior provider note for more detail.   Briefly: Patient is 62 y.o. who presents to the ED complaining of left-sided chest pain, cough, and shortness of breath for the last week.  He states that chest pain is intermittent and sharp in nature.  It is worse with coughing.  Notes that occasionally he has a productive cough but sometimes it is dry.  No associated fever, chills, abdominal pain, nausea, vomiting, or diarrhea.  No known sick contacts.  No leg pain or swelling.  Denies history of asthma, COPD, or lung disease.  He does smoke approximately 1.5 packs/day of tobacco as well as marijuana.  Remote history of other illicit drug use.  States that he has felt this way intermittently for years now but not this severe.  No personal or family history of heart disease.    DDX: concern for Acute coronary syndrome, pericarditis, aortic dissection, pulmonary embolism, tension pneumothorax, and esophageal rupture, all of which I have a low suspicion of in this patient given reassuring history and exam. Other urgent/non-acute considerations include but are not limited to: chronic angina, aortic stenosis, cardiomyopathy, myocarditis, mitral valve prolapse, pulmonary hypertension, hypertrophic obstructive cardiomyopathy (HOCM), aortic insufficiency, right ventricular hypertrophy, pneumonia, pleuritis, bronchitis, pneumothorax, tumor, gastroesophageal reflux disease (GERD), esophageal spasm, Mallory-Weiss syndrome, peptic ulcer disease, biliary disease, pancreatitis, functional gastrointestinal pain, cervical or thoracic disk disease or arthritis, shoulder arthritis, costochondritis, subacromial bursitis, anxiety or panic attack, herpes zoster, breast disorders, chest wall tumors, thoracic outlet syndrome, mediastinitis.      Plan: Initial and repeat troponin within normal limits and not increasing. Chest xray concerning for Multifocal PNA. Mild  leukocytosis. Patient mildly tachycardic - ordered IV fluids which resolved tachycardia. Patient given dose of Rocephin in ED for Multifocal PNA. Patient given Albuterol and Decadron and states that his SOB has resolved. Lungs are now clear to auscultation bilaterally. Patient stating that he feels like he is breathing normally again. Patient febrile with stable vitals. Patient stating that he is ready for discharge. Patient with O2 sats >96% on room air with ambulation. Prescribing patient outpatient ABX for PNA. Provided patient with strict return precautions.  Patient with hyponatremia (129) - patient denies nausea, vomiting, headache, AMS, fatigue, irritability, muscle weakness/cramps, seizures, loss of consciousness       Margarita Rana 08/13/22 2030    Gerhard Munch, MD 08/13/22 [id number]

## 2022-08-13 NOTE — ED Provider Notes (Signed)
[place] EMERGENCY DEPARTMENT AT [facility] Provider Note   CSN: [id number] Arrival date & time: 08/13/22  1401     History  No chief complaint on file.   Christopher Farrell is a 62 y.o. male with past medical history HIV adherent with Biktarvy regimen who presents to the ED complaining of left-sided chest pain, cough, and shortness of breath for the last week.  He states that chest pain is intermittent and sharp in nature.  It is worse with coughing.  Notes that occasionally he has a productive cough but sometimes it is dry.  No associated fever, chills, abdominal pain, nausea, vomiting, or diarrhea.  No known sick contacts.  No leg pain or swelling.  Denies history of asthma, COPD, or lung disease.  He does smoke approximately 1.5 packs/day of tobacco as well as marijuana.  Remote history of other illicit drug use.  States that he has felt this way intermittently for years now but not this severe.  No personal or family history of heart disease.      Home Medications Prior to Admission medications   Medication Sig Start Date End Date Taking? Authorizing Provider  cefTRIAXone (ROCEPHIN) 1 g injection Inject 1 g into the muscle once for 1 dose. 08/13/22 08/13/22 Yes Christopher Farrell  bictegravir-emtricitabine-tenofovir AF (BIKTARVY) 50-200-25 MG TABS tablet Take 1 tablet by mouth daily. Try to take at the same time each day with or without food. 01/06/21   Christopher Farrell  bictegravir-emtricitabine-tenofovir AF (BIKTARVY) 50-200-25 MG TABS tablet Take 1 tablet by mouth daily for 7 days. 06/22/21 06/29/21  Christopher Farrell  nicotine polacrilex (NICORETTE) 4 MG gum Take 1 each (4 mg total) by mouth as needed for smoking cessation. Patient not taking: Reported on 07/20/2021 01/06/21   Christopher Farrell      Allergies    Patient has no known allergies.    Review of Systems   Review of Systems  All other systems reviewed and are negative.   Physical Exam Updated  Vital Signs BP 117/72 (BP Location: Right Arm)   Pulse (!) 106   Temp 98.2 F (36.8 C)   Resp 20   Ht 5\' 4"  (1.626 m)   Wt 65.8 kg   SpO2 95%   BMI 24.89 kg/m  Physical Exam Vitals and nursing note reviewed.  Constitutional:      General: He is not in acute distress.    Appearance: Normal appearance. He is not ill-appearing or toxic-appearing.  HENT:     Head: Normocephalic and atraumatic.     Nose: Congestion present.     Mouth/Throat:     Mouth: Mucous membranes are moist.  Eyes:     Extraocular Movements: Extraocular movements intact.     Conjunctiva/sclera: Conjunctivae normal.  Cardiovascular:     Rate and Rhythm: Regular rhythm. Tachycardia present.     Heart sounds: No murmur heard. Pulmonary:     Effort: Pulmonary effort is normal. No respiratory distress.     Breath sounds: No stridor. Wheezing (coarse worse on expiration throughout) present. No rhonchi or rales.  Chest:     Chest wall: Tenderness (minimal to Farrell chest, reproduces complaint) present.  Abdominal:     General: Abdomen is flat. There is no distension.     Palpations: Abdomen is soft.     Tenderness: There is no abdominal tenderness. There is no guarding or rebound.  Musculoskeletal:        General: Normal range  of motion.     Cervical back: Normal range of motion and neck supple. No rigidity.     Right lower leg: No edema.     Left lower leg: No edema.     Comments: No calf tenderness bilaterally  Skin:    General: Skin is warm and dry.     Capillary Refill: Capillary refill takes less than 2 seconds.  Neurological:     General: No focal deficit present.     Mental Status: He is alert and oriented to person, place, and time.  Psychiatric:        Mood and Affect: Mood normal.        Behavior: Behavior normal.     ED Results / Procedures / Treatments   Labs (all labs ordered are listed, but only abnormal results are displayed) Labs Reviewed  BASIC METABOLIC PANEL - Abnormal; Notable for the  following components:      Result Value   Sodium 129 (*)    Chloride 93 (*)    CO2 21 (*)    Glucose, Bld 101 (*)    Calcium 8.4 (*)    All other components within normal limits  CBC - Abnormal; Notable for the following components:   WBC 11.3 (*)    Hemoglobin 12.9 (*)    HCT 38.6 (*)    All other components within normal limits  RESP PANEL BY RT-PCR (RSV, FLU A&B, COVID)  RVPGX2  TROPONIN I (HIGH SENSITIVITY)    EKG EKG Interpretation  Date/Time:  Saturday August 13 2022 14:01:33 EDT Ventricular Rate:  104 PR Interval:  122 QRS Duration: 68 QT Interval:  338 QTC Calculation: 444 R Axis:   58 Text Interpretation: Sinus tachycardia Right atrial enlargement Borderline ECG When compared with ECG of 07-Dec-2019 13:57, PREVIOUS ECG IS PRESENT Confirmed by Vonita Moss [phone]) on 08/13/2022 2:23:55 PM  Radiology DG Chest 2 View  Result Date: 08/13/2022 CLINICAL DATA:  Chest pain EXAM: CHEST - 2 VIEW COMPARISON:  Previous studies including the examination of 12/07/2019 FINDINGS: Cardiac size is within normal limits. New patchy moderate to large infiltrates in right upper lobe and right middle lobe suggesting multifocal pneumonia. Rest of the lung fields are clear. There is no pleural effusion or pneumothorax. IMPRESSION: New patchy alveolar infiltrates are seen in right upper lobe and right middle lobe suggesting multifocal pneumonia. Electronically Signed   By: Christopher Avena M.D.   On: 08/13/2022 14:59    Procedures Procedures    Medications Ordered in ED Medications  dexamethasone (DECADRON) injection 10 mg (has no administration in time range)  albuterol (PROVENTIL) (2.5 MG/3ML) 0.083% nebulizer solution (has no administration in time range)  albuterol (VENTOLIN HFA) 108 (90 Base) MCG/ACT inhaler 1-2 puff (has no administration in time range)    ED Course/ Medical Decision Making/ A&P                             Medical Decision Making Amount and/or Complexity of  Data Reviewed Labs: ordered. Decision-making details documented in ED Course. Radiology: ordered. Decision-making details documented in ED Course. ECG/medicine tests: ordered. Decision-making details documented in ED Course.   Medical Decision Making:   Christopher Farrell is a 62 y.o. male who presented to the ED today with chest pain detailed above.    Patient's presentation is complicated by their history of HIV, tobacco use.  Complete initial physical exam performed, notably the patient  was in  NAD. He had diffuse coarse expiratory wheezing but no stridor, normal phonation, and no signs of respiratory distress. No LE edema or tenderness. Abdomen soft and nontender. Neurologically intact.    Reviewed and confirmed nursing documentation for past medical history, family history, social history.    Initial Assessment:   With the patient's presentation of chest pain, the emergent differential diagnosis of chest pain includes: Acute coronary syndrome, pericarditis, aortic dissection, pulmonary embolism, tension pneumothorax, and esophageal rupture, all of which I have a low suspicion of in this patient given reassuring history and exam. Other urgent/non-acute considerations include but are not limited to: chronic angina, aortic stenosis, cardiomyopathy, myocarditis, mitral valve prolapse, pulmonary hypertension, hypertrophic obstructive cardiomyopathy (HOCM), aortic insufficiency, right ventricular hypertrophy, pneumonia, pleuritis, bronchitis, pneumothorax, tumor, gastroesophageal reflux disease (GERD), esophageal spasm, Mallory-Weiss syndrome, peptic ulcer disease, biliary disease, pancreatitis, functional gastrointestinal pain, cervical or thoracic disk disease or arthritis, shoulder arthritis, costochondritis, subacromial bursitis, anxiety or panic attack, herpes zoster, breast disorders, chest wall tumors, thoracic outlet syndrome, mediastinitis.    Initial Plan:  Screening labs including CBC and  Metabolic panel to evaluate for infectious or metabolic etiology of disease.  CXR to evaluate for structural/infectious intrathoracic pathology.  EKG and troponin to evaluate for cardiac pathology Dose of steroids, breathing treatment to treat underlying bronchitis/wheezing Viral swabs to assess for etiology of symptoms Objective evaluation as reviewed   Initial Study Results:   Laboratory  All laboratory results reviewed without evidence of clinically relevant pathology.   Exceptions include: Sodium 129, chloride 93, WBC 11.3, hemoglobin 12.9  EKG EKG was reviewed independently. ST segments without concerns for elevations.   EKG: sinus tachycardia.   Radiology:  All images reviewed independently. Agree with radiology report at this time.   DG Chest 2 View  Result Date: 08/13/2022 CLINICAL DATA:  Chest pain EXAM: CHEST - 2 VIEW COMPARISON:  Previous studies including the examination of 12/07/2019 FINDINGS: Cardiac size is within normal limits. New patchy moderate to large infiltrates in right upper lobe and right middle lobe suggesting multifocal pneumonia. Rest of the lung fields are clear. There is no pleural effusion or pneumothorax. IMPRESSION: New patchy alveolar infiltrates are seen in right upper lobe and right middle lobe suggesting multifocal pneumonia. Electronically Signed   By: Christopher Avena M.D.   On: 08/13/2022 14:59      Final Assessment and Plan:   62 year old male presenting for 1 week of pleuritic type chest pain and shortness of breath.  He is significantly wheezing on exam.  History of HIV and apparently Biktarvy.  No respiratory distress.  Normal phonation.  Maintaining oxygen saturation on room air.  He is slightly tachycardic.  Continuous nebulizer treatment in addition to a dose of Decadron ordered for treatment.  X-ray shows multifocal pneumonia.  No recent antibiotics.  Dose of Rocephin given for treatment of pneumonia.  EKG sinus tachycardia without acute ST-T  changes.  Pending chest pain workup, reevaluation, ambulatory O2 at shift change and care transition to [facility], Farrell, for further management.    Clinical Impression:  1. Multifocal pneumonia   2. Chest wall pain      Data Unavailable           Final Clinical Impression(s) / ED Diagnoses Final diagnoses:  Chest wall pain  Multifocal pneumonia    Rx / DC Orders ED Discharge Orders          Ordered    cefTRIAXone (ROCEPHIN) 1 g injection   Once  08/13/22 1509              Tonette Lederer, Farrell 08/13/22 1521    Rondel Baton, Farrell 08/16/22 [phone]

## 2022-08-13 NOTE — ED Triage Notes (Signed)
Pt c/o cough and CP associated with cough x 1 week; endorses some sob; no N/V, no fevers; denies sick contacts; denies cardiac hx

## 2022-08-13 NOTE — ED Notes (Signed)
Patient ambulated while monitoring pulse ox remained at 100% for 30 feet.

## 2022-08-13 NOTE — Discharge Instructions (Addendum)
I am glad you are feeling better. Chest xray was concerned for right lung pneumonia. You have received a dose of antibiotics here in ED and I am discharging you with a course of antibiotics for you to take at home. Start taking antibiotics tomorrow. Please take entire course of antibiotics even if symptoms resolve. Please seek emergency care if experiencing any new or worsening symptoms such as increased difficulty breathing, confusion, or chest pain.

## 2022-08-31 ENCOUNTER — Emergency Department (HOSPITAL_COMMUNITY)
Admission: EM | Admit: 2022-08-31 | Discharge: 2022-08-31 | Disposition: A | Discharge status: Home / Self Care | Payer: Medicaid Other | Attending: Emergency Medicine | Admitting: Emergency Medicine

## 2022-08-31 ENCOUNTER — Emergency Department (HOSPITAL_COMMUNITY): Payer: Medicaid Other

## 2022-08-31 ENCOUNTER — Telehealth: Payer: Self-pay

## 2022-08-31 ENCOUNTER — Other Ambulatory Visit: Payer: Self-pay

## 2022-08-31 ENCOUNTER — Ambulatory Visit: Payer: Self-pay | Admitting: *Deleted

## 2022-08-31 ENCOUNTER — Encounter (HOSPITAL_COMMUNITY): Payer: Self-pay | Admitting: Emergency Medicine

## 2022-08-31 DIAGNOSIS — M7918 Myalgia, other site: Secondary | ICD-10-CM | POA: Insufficient documentation

## 2022-08-31 DIAGNOSIS — Z21 Asymptomatic human immunodeficiency virus [HIV] infection status: Secondary | ICD-10-CM | POA: Diagnosis not present

## 2022-08-31 DIAGNOSIS — F17200 Nicotine dependence, unspecified, uncomplicated: Secondary | ICD-10-CM | POA: Diagnosis not present

## 2022-08-31 DIAGNOSIS — J188 Other pneumonia, unspecified organism: Secondary | ICD-10-CM | POA: Diagnosis not present

## 2022-08-31 DIAGNOSIS — Z20822 Contact with and (suspected) exposure to covid-19: Secondary | ICD-10-CM | POA: Insufficient documentation

## 2022-08-31 DIAGNOSIS — J189 Pneumonia, unspecified organism: Secondary | ICD-10-CM

## 2022-08-31 DIAGNOSIS — R059 Cough, unspecified: Secondary | ICD-10-CM | POA: Diagnosis present

## 2022-08-31 DIAGNOSIS — R52 Pain, unspecified: Secondary | ICD-10-CM

## 2022-08-31 DIAGNOSIS — B370 Candidal stomatitis: Secondary | ICD-10-CM

## 2022-08-31 LAB — CBC WITH DIFFERENTIAL/PLATELET
Abs Immature Granulocytes: 0.11 10*3/uL — ABNORMAL HIGH (ref 0.00–0.07)
Basophils Absolute: 0 10*3/uL (ref 0.0–0.1)
Basophils Relative: 0 %
Eosinophils Absolute: 0 10*3/uL (ref 0.0–0.5)
Eosinophils Relative: 0 %
HCT: 37.9 % — ABNORMAL LOW (ref 39.0–52.0)
Hemoglobin: 12.6 g/dL — ABNORMAL LOW (ref 13.0–17.0)
Immature Granulocytes: 1 %
Lymphocytes Relative: 26 %
Lymphs Abs: 2.1 10*3/uL (ref 0.7–4.0)
MCH: 29.4 pg (ref 26.0–34.0)
MCHC: 33.2 g/dL (ref 30.0–36.0)
MCV: 88.3 fL (ref 80.0–100.0)
Monocytes Absolute: 0.8 10*3/uL (ref 0.1–1.0)
Monocytes Relative: 10 %
Neutro Abs: 5.1 10*3/uL (ref 1.7–7.7)
Neutrophils Relative %: 63 %
Platelets: 141 10*3/uL — ABNORMAL LOW (ref 150–400)
RBC: 4.29 MIL/uL (ref 4.22–5.81)
RDW: 13.9 % (ref 11.5–15.5)
WBC: 8.1 10*3/uL (ref 4.0–10.5)
nRBC: 0 % (ref 0.0–0.2)

## 2022-08-31 LAB — COMPREHENSIVE METABOLIC PANEL
ALT: 22 U/L (ref 0–44)
AST: 34 U/L (ref 15–41)
Albumin: 2.4 g/dL — ABNORMAL LOW (ref 3.5–5.0)
Alkaline Phosphatase: 50 U/L (ref 38–126)
Anion gap: 13 (ref 5–15)
BUN: 14 mg/dL (ref 8–23)
CO2: 22 mmol/L (ref 22–32)
Calcium: 8.3 mg/dL — ABNORMAL LOW (ref 8.9–10.3)
Chloride: 95 mmol/L — ABNORMAL LOW (ref 98–111)
Creatinine, Ser: 0.82 mg/dL (ref 0.61–1.24)
GFR, Estimated: >60 mL/min (ref >60)
Glucose, Bld: 105 mg/dL — ABNORMAL HIGH (ref 70–99)
Potassium: 3.7 mmol/L (ref 3.5–5.1)
Sodium: 130 mmol/L — ABNORMAL LOW (ref 135–145)
Total Bilirubin: 0.6 mg/dL (ref 0.3–1.2)
Total Protein: 7.1 g/dL (ref 6.5–8.1)

## 2022-08-31 LAB — GROUP A STREP BY PCR: Group A Strep by PCR: NOT DETECTED

## 2022-08-31 LAB — RESP PANEL BY RT-PCR (RSV, FLU A&B, COVID)  RVPGX2
Influenza A by PCR: NEGATIVE
Influenza B by PCR: NEGATIVE
Resp Syncytial Virus by PCR: NEGATIVE
SARS Coronavirus 2 by RT PCR: NEGATIVE

## 2022-08-31 LAB — MAGNESIUM: Magnesium: 1.9 mg/dL (ref 1.7–2.4)

## 2022-08-31 LAB — MONONUCLEOSIS SCREEN: Mono Screen: NEGATIVE

## 2022-08-31 MED ORDER — AZITHROMYCIN 250 MG PO TABS: 250.0000 mg | ORAL_TABLET | Freq: Every day | ORAL | 0 refills | Status: DC

## 2022-08-31 MED ORDER — NYSTATIN 100000 UNIT/ML MT SUSP: 5.0000 mL | Freq: Four times a day (QID) | OROMUCOSAL | 0 refills | Status: DC

## 2022-08-31 MED ORDER — AMOXICILLIN-POT CLAVULANATE 875-125 MG PO TABS: 1.0000 | ORAL_TABLET | Freq: Two times a day (BID) | ORAL | 0 refills | Status: DC

## 2022-08-31 NOTE — Discharge Instructions (Addendum)
Thank you for allowing [place] to be a part of your care today.  You were evaluated in the ED for generalized body aches.   Your chest x-ray does show some improvement in your pneumonia and your lab workup is overall reassuring.  We are sending you home on 2 antibiotics to further treat your pneumonia.  There is also a mouthwash for you to use 4 times per day to help with your sore throat and white patches.   Please schedule a follow-up appointment with infectious disease as soon as possible.   Return to the ED if you develop sudden worsening of your symptoms or if you have any new concerns.

## 2022-08-31 NOTE — Telephone Encounter (Signed)
Reason for Disposition . [1] MODERATE difficulty breathing (e.g., speaks in phrases, SOB even at rest, pulse 100-120) AND [2] NEW-onset or WORSE than normal  Answer Assessment - Initial Assessment Questions 1. RESPIRATORY STATUS: "Describe your breathing?" (e.g., wheezing, shortness of breath, unable to speak, severe coughing)      Breathing harder than normal 2. ONSET: "When did this breathing problem begin?"      2 days ago patient has been trying to get up and walk  4. SEVERITY: "How bad is your breathing?" (e.g., mild, moderate, severe)    - MILD: No SOB at rest, mild SOB with walking, speaks normally in sentences, can lie down, no retractions, pulse < 100.    - MODERATE: SOB at rest, SOB with minimal exertion and prefers to sit, cannot lie down flat, speaks in phrases, mild retractions, audible wheezing, pulse 100-120.    - SEVERE: Very SOB at rest, speaks in single words, struggling to breathe, sitting hunched forward, retractions, pulse > 120      moderate 5. RECURRENT SYMPTOM: "Have you had difficulty breathing before?" If Yes, ask: "When was the last time?" and "What happened that time?"      No- patient has recently been in hospital with pneumonia 6. CARDIAC HISTORY: "Do you have any history of heart disease?" (e.g., heart attack, angina, bypass surgery, angioplasty)      no 7. LUNG HISTORY: "Do you have any history of lung disease?"  (e.g., pulmonary embolus, asthma, emphysema)     Recent pneumonia  8. CAUSE: "What do you think is causing the breathing problem?"      Cough clear 9. OTHER SYMPTOMS: "Do you have any other symptoms? (e.g., dizziness, runny nose, cough, chest pain, fever)     cough  Protocols used: Breathing Difficulty-A-AH

## 2022-08-31 NOTE — ED Provider Notes (Signed)
[place] EMERGENCY DEPARTMENT AT [facility] Provider Note   CSN: [id number] Arrival date & time: 08/31/22  1252     History {Add pertinent medical, surgical, social history, OB history to HPI:1} Chief Complaint  Patient presents with   Generalized Body Aches    Christopher Farrell is a 62 y.o. male.  Patient with history of HIV not currently taking his antiretrovirals presents today with complaints of generalized bodyaches and sore throat.  He states that same has been ongoing for the last few days.  States that his entire body hurts.  No area hurts worse than another.  Denies any fevers or chills.  Does also state that it hurts to swallow and he has these new white lesions in his mouth. States that the spots go away when he brushes his teeth. He states he has not been on his antiretrovirals for several years.  Was recently treated for pneumonia and states that his symptoms have improved with the antibiotics he was given but his symptoms have not completely resolved and he was not given antibiotics to go home with.  He notes that he does have some cough and shortness of breath, however states that this is not new. Denies any chest pain, abdominal pain, nausea, vomiting, diarrhea, or dysuria. No wounds or rashes. He has not seen an ID doctor in over a year.  The history is provided by the patient. No language interpreter was used.       Home Medications Prior to Admission medications   Medication Sig Start Date End Date Taking? Authorizing Provider  bictegravir-emtricitabine-tenofovir AF (BIKTARVY) 50-200-25 MG TABS tablet Take 1 tablet by mouth daily. Try to take at the same time each day with or without food. 01/06/21   Danelle Earthly, MD  bictegravir-emtricitabine-tenofovir AF (BIKTARVY) 50-200-25 MG TABS tablet Take 1 tablet by mouth daily for 7 days. 06/22/21 06/29/21  Jennette Kettle, RPH-CPP  nicotine polacrilex (NICORETTE) 4 MG gum Take 1 each (4 mg total) by mouth as  needed for smoking cessation. Patient not taking: Reported on 07/20/2021 01/06/21   Danelle Earthly, MD      Allergies    Patient has no known allergies.    Review of Systems   Review of Systems  All other systems reviewed and are negative.   Physical Exam Updated Vital Signs BP 100/81 (BP Location: Left Arm)   Pulse 92   Temp 97.9 F (36.6 C) (Oral)   Resp 17   Ht 5\' 4"  (1.626 m)   Wt 65.8 kg   SpO2 97%   BMI 24.89 kg/m  Physical Exam Vitals and nursing note reviewed.  Constitutional:      General: He is not in acute distress.    Appearance: Normal appearance. He is normal weight. He is not ill-appearing, toxic-appearing or diaphoretic.  HENT:     Head: Normocephalic and atraumatic.     Mouth/Throat:     Comments: White plaques noted throughout the mouth and oropharynx Cardiovascular:     Rate and Rhythm: Normal rate and regular rhythm.     Heart sounds: Normal heart sounds.  Pulmonary:     Effort: Pulmonary effort is normal. No respiratory distress.     Comments: Rhonchi on the right side Abdominal:     General: Abdomen is flat.     Palpations: Abdomen is soft.     Tenderness: There is no abdominal tenderness.  Musculoskeletal:        General: Normal range of motion.  Cervical back: Normal range of motion and neck supple.     Right lower leg: No edema.     Left lower leg: No edema.  Skin:    General: Skin is warm and dry.     Findings: No lesion or rash.  Neurological:     General: No focal deficit present.     Mental Status: He is alert.  Psychiatric:        Mood and Affect: Mood normal.        Behavior: Behavior normal.     ED Results / Procedures / Treatments   Labs (all labs ordered are listed, but only abnormal results are displayed) Labs Reviewed  GROUP A STREP BY PCR  RESP PANEL BY RT-PCR (RSV, FLU A&B, COVID)  RVPGX2  CBC WITH DIFFERENTIAL/PLATELET  COMPREHENSIVE METABOLIC PANEL  MAGNESIUM  MONONUCLEOSIS SCREEN     EKG None  Radiology DG Chest 2 View  Result Date: 08/31/2022 CLINICAL DATA:  Chest radiograph dated August 13, 2022 EXAM: CHEST - 2 VIEW COMPARISON:  Chest radiograph dated August 13, 2022 FINDINGS: The heart size and mediastinal contours are within normal limits. Interval improvement of the right upper and middle lobe lung opacities. Persistent atelectasis of the right middle lobe with mild remaining hazy opacities of the right upper and middle lobe suggesting resolving pneumonia. No pleural effusion. The visualized skeletal structures are unremarkable. IMPRESSION: Interval improvement of the right upper and middle lobe lung opacities. Persistent atelectasis of the right middle lobe with mild remaining hazy opacities of the right upper and middle lobe suggesting resolving pneumonia. Electronically Signed   By: Larose Hires D.O.   On: 08/31/2022 14:32    Procedures Procedures  {Document cardiac monitor, telemetry assessment procedure when appropriate:1}  Medications Ordered in ED Medications - No data to display  ED Course/ Medical Decision Making/ A&P   {   Click here for ABCD2, HEART and other calculatorsREFRESH Note before signing :1}                          Medical Decision Making Amount and/or Complexity of Data Reviewed Labs: ordered. Radiology: ordered.   This patient is a 62 y.o. male who presents to the ED for concern of generalized bodyaches, this involves an extensive number of treatment options, and is a complaint that carries with it a high risk of complications and morbidity. The emergent differential diagnosis prior to evaluation includes, but is not limited to,  URI, pneumonia, sepsis, atypical infection from noncompliance with antiretrovirals. This is not an exhaustive differential.   Past Medical History / Co-morbidities / Social History: history of HIV not currently taking his antiretrovirals  Additional history: Chart reviewed. Pertinent results include: has not  seen ID in over a year. Recently diagnosed with pneumonia on 6/08 and given IM rocephin and discharged without oral antibiotics  Physical Exam: Physical exam performed. The pertinent findings include: rhonchi on the right with productive cough noted. White plaques in the oropharynx  Lab Tests: I ordered, and personally interpreted labs.  The pertinent results include:  strep and rvp negative. Labs pending   Imaging Studies: I ordered imaging studies including CXR. I independently visualized and interpreted imaging which showed   Interval improvement of the right upper and middle lobe lung opacities. Persistent atelectasis of the right middle lobe with mild remaining hazy opacities of the right upper and middle lobe suggesting resolving pneumonia.  I agree with the radiologist interpretation.  Disposition:  Patients labs pending at shift change. If unremarkable, suspect patient can be discharged with augmentin and azithromycin for pneumonia and nystatin swish and swallow for oral candidia. Discussed with patient who is understanding and in agreement.  Care handoff to [facility], PA-C at shift change. Please see their note for further evaluation and dispo.  I discussed this case with my attending physician Dr. Jeraldine Loots who cosigned this note including patient's presenting symptoms, physical exam, and planned diagnostics and interventions. Attending physician stated agreement with plan or made changes to plan which were implemented.     Final Clinical Impression(s) / ED Diagnoses Final diagnoses:  Multifocal pneumonia  Generalized body aches    Rx / DC Orders ED Discharge Orders     None

## 2022-08-31 NOTE — ED Triage Notes (Signed)
Patient arrives ambulatory by POV c/o body aches and sore throat x 3 days. Denies any sick contacts.

## 2022-08-31 NOTE — Telephone Encounter (Signed)
Patient's mother is calling:  Chief Complaint: SOB Symptoms: patient seems to be" struggling to breath" at times, walks and then has to lay down and rest Frequency: 2 days Pertinent Negatives: Patient denies fever Disposition: [x] ED /[] Urgent Care (no appt availability in office) / [] Appointment(In office/virtual)/ []  [place] [facility]/ [] Home Care/ [] Refused Recommended Disposition /[] [place] [facility]/ []  Follow-up with PCP Additional Notes: Advised if patient is struggling to breath- needs ED- mother will take him

## 2022-08-31 NOTE — ED Provider Notes (Signed)
Accepted handoff at shift change from Lurena Nida, PA-C. Please see prior provider note for more detail.   Briefly: Farrell is 62 y.o. with past medical history for HIV, HSV, tobacco use presents to [facility] with generalized body aches and sore throat.  [facility] has not been on Biktarvy for several years and has not seen infectious disease for over a year.  Farrell was recently treated for pneumonia and states his symptoms have improved, but [facility]rell continues to have cough and shortness of breath, though this is chronic.  [facility] also has painful swallowing and white lesions in his mouth.    DDX: concern for pneumonia, sepsis, atypical infection from antiretroviral noncompliance, oral candidiasis, oral leukoplakia  Plan: Follow up on lab results.  Farrell may be discharged home if workup is unremarkable and Farrell to follow up with infectious disease.      Results for orders placed or performed during [facility] hospital encounter of 08/31/22  Group A Strep by PCR   Specimen: Throat; Sterile Swab  Result Value Ref Range   Group A Strep by PCR NOT DETECTED NOT DETECTED  Resp panel by RT-PCR (RSV, Flu A&B, Covid) Throat   Specimen: Throat; Nasal Swab  Result Value Ref Range   SARS Coronavirus 2 by RT PCR NEGATIVE NEGATIVE   Influenza A by PCR NEGATIVE NEGATIVE   Influenza B by PCR NEGATIVE NEGATIVE   Resp Syncytial Virus by PCR NEGATIVE NEGATIVE  CBC with Differential  Result Value Ref Range   WBC 8.1 4.0 - 10.5 K/uL   RBC 4.29 4.22 - 5.81 MIL/uL   Hemoglobin 12.6 (L) 13.0 - 17.0 g/dL   HCT 82.9 (L) 56.2 - 13.0 %   MCV 88.3 80.0 - 100.0 fL   MCH 29.4 26.0 - 34.0 pg   MCHC 33.2 30.0 - 36.0 g/dL   RDW 86.5 78.4 - 69.6 %   Platelets 141 (L) 150 - 400 K/uL   nRBC 0.0 0.0 - 0.2 %   Neutrophils Relative % 63 %   Neutro Abs 5.1 1.7 - 7.7 K/uL   Lymphocytes Relative 26 %   Lymphs Abs 2.1 0.7 - 4.0 K/uL   Monocytes Relative 10 %   Monocytes Absolute 0.8 0.1 - 1.0 K/uL   Eosinophils Relative 0 %   Eosinophils  Absolute 0.0 0.0 - 0.5 K/uL   Basophils Relative 0 %   Basophils Absolute 0.0 0.0 - 0.1 K/uL   Immature Granulocytes 1 %   Abs Immature Granulocytes 0.11 (H) 0.00 - 0.07 K/uL   DG Chest 2 View  Result Date: 08/31/2022 CLINICAL DATA:  Chest radiograph dated August 13, 2022 EXAM: CHEST - 2 VIEW COMPARISON:  Chest radiograph dated August 13, 2022 FINDINGS: [facility] heart size and mediastinal contours are within normal limits. Interval improvement of [facility] right upper and middle lobe lung opacities. Persistent atelectasis of [facility] right middle lobe with mild remaining hazy opacities of [facility] right upper and middle lobe suggesting resolving pneumonia. No pleural effusion. [facility] visualized skeletal structures are unremarkable. IMPRESSION: Interval improvement of [facility] right upper and middle lobe lung opacities. Persistent atelectasis of [facility] right middle lobe with mild remaining hazy opacities of [facility] right upper and middle lobe suggesting resolving pneumonia. Electronically Signed   By: Larose Hires D.O.   On: 08/31/2022 14:32   DG Chest 2 View  Result Date: 08/13/2022 CLINICAL DATA:  Chest pain EXAM: CHEST - 2 VIEW COMPARISON:  Previous studies including [facility] examination of 12/07/2019 FINDINGS: Cardiac size is within normal  limits. New patchy moderate to large infiltrates in right upper lobe and right middle lobe suggesting multifocal pneumonia. Rest of [facility] lung fields are clear. There is no pleural effusion or pneumothorax. IMPRESSION: New patchy alveolar infiltrates are seen in right upper lobe and right middle lobe suggesting multifocal pneumonia. Electronically Signed   By: Ernie Avena M.D.   On: 08/13/2022 14:59    Physical Exam  BP 100/81 (BP Location: Left Arm)   Pulse 92   Temp 97.9 F (36.6 C) (Oral)   Resp 17   Ht 5\' 4"  (1.626 m)   Wt 65.8 kg   SpO2 97%   BMI 24.89 kg/m   Physical Exam Vitals and nursing note reviewed.  Constitutional:      General: [facility] is not in acute distress.    Appearance:  Normal appearance. [facility] is not ill-appearing or diaphoretic.  Cardiovascular:     Rate and Rhythm: Normal rate and regular rhythm.  Pulmonary:     Effort: Pulmonary effort is normal.  Skin:    General: Skin is warm and dry.     Capillary Refill: Capillary refill takes less than 2 seconds.  Neurological:     Mental Status: [facility] is alert. Mental status is at baseline.  Psychiatric:        Mood and Affect: Mood normal.        Behavior: Behavior normal.     Procedures  Procedures  ED Course / MDM    Medical Decision Making Amount and/or Complexity of Data Reviewed Labs: ordered. Radiology: ordered.  Risk Prescription drug management.   Farrell's lab workup is overall reassuring.  [facility] does have interval improvement in pneumonia, but given his history, [facility]rell will be sent home on 2 antibiotics to continue treating this.  [facility] will also be sent home on Nystatin mouthwash for suspected oral candidiasis.  Farrell to follow up outpatient with infectious disease.  Discussed this with [facility] who is in agreement with plan.  Farrell is eating and drinking without difficulty and upon reassessment, appears comfortable.   [facility] has been appropriately medically screened and/or stabilized in [facility]. I have low suspicion for any other emergent medical condition which would require further screening, evaluation or treatment in [facility] or require inpatient management. At time of discharge [facility] is hemodynamically stable and in no acute distress. I have discussed work-up results and diagnosis with Farrell and answered all questions. Farrell is agreeable with discharge plan. We discussed strict return precautions for returning to [facility] emergency department and they verbalized understanding.         Lenard Simmer, PA-C 08/31/22 1620    Ernie Avena, MD 08/31/22 [id number]

## 2022-08-31 NOTE — Telephone Encounter (Signed)
Patient's mother called office today requesting appointment. States that he is having body aches, sweating, and breathing heavily. Was seen by Ed on 6/8 for pneumonia. Does not have PCP who he can follow up with. Is requesting work note as well since he has been out of work due to feeling sick.  Office does not have any open appointment's this week. Advised they go to urgent care for evaluation. Verbalized understanding.  Scheduled for follow up on 7/18. Will call office back to see if office has any earlier appointments for follow up.  Juanita Laster, RMA

## 2022-09-01 ENCOUNTER — Ambulatory Visit: Payer: Medicaid Other | Admitting: Physician Assistant

## 2022-09-01 ENCOUNTER — Encounter: Payer: Self-pay | Admitting: Physician Assistant

## 2022-09-01 ENCOUNTER — Inpatient Hospital Stay: Payer: Self-pay | Admitting: Internal Medicine

## 2022-09-01 VITALS — BP 120/81 | HR 97 | Temp 99.8°F | Ht 63.0 in | Wt 145.0 lb

## 2022-09-01 DIAGNOSIS — J189 Pneumonia, unspecified organism: Secondary | ICD-10-CM

## 2022-09-01 DIAGNOSIS — R079 Chest pain, unspecified: Secondary | ICD-10-CM | POA: Diagnosis not present

## 2022-09-01 DIAGNOSIS — F17210 Nicotine dependence, cigarettes, uncomplicated: Secondary | ICD-10-CM | POA: Diagnosis not present

## 2022-09-01 MED ORDER — ACETAMINOPHEN 325 MG PO TABS
650.0000 mg | ORAL_TABLET | Freq: Once | ORAL | Status: AC
Start: 2022-09-01 — End: 2022-09-01
  Administered 2022-09-01: 650 mg via ORAL

## 2022-09-01 MED ORDER — PREDNISONE 20 MG PO TABS
ORAL_TABLET | ORAL | 0 refills | Status: DC
Start: 2022-09-01 — End: 2022-10-17

## 2022-09-01 NOTE — Patient Instructions (Signed)
In addition to the antibiotics that were prescribed yesterday in the emergency department, you are going to use a steroid taper.  I also encourage you to use Tylenol every 6 hours to help with your body aches and keep your temperature reduced.  Make sure that you are sitting upright as often as possible, taking deep breaths.  I hope that you feel better soon, please let us know if there is anything else we can do for you  Roney Jaffe, PA-C Physician Assistant [facility] [email or web address]   Community-Acquired Pneumonia, Adult Pneumonia is a lung infection that causes inflammation and the buildup of mucus and fluids in the lungs. This may cause coughing and difficulty breathing. Community-acquired pneumonia is pneumonia that develops in people who are not, and have not recently been, in a hospital or other health care facility. Usually, pneumonia develops as a result of an illness that is caused by a virus, such as the common cold and the flu (influenza). It can also be caused by bacteria or fungi. While the common cold and influenza can pass from person to person (are contagious), pneumonia itself is not considered contagious. What are the causes? This condition may be caused by: Viruses. Bacteria. Fungi. What increases the risk? The following factors may make you more likely to develop this condition: Being over age 51 or having certain medical conditions, such as: A long-term (chronic) disease, such as: chronic obstructive pulmonary disease (COPD), asthma, heart failure, diabetes, or kidney disease. A condition that increases the risk of breathing in (aspirating) mucus and other fluids from your mouth and nose. A weakened body defense system (immune system). Having had your spleen removed (splenectomy). The spleen is the organ that helps fight germs and infections. Not cleaning your teeth and gums well (poor dental  hygiene). Using tobacco products. Traveling to places where germs that cause pneumonia are present or being near certain animals or animal habitats that could have germs that cause pneumonia. What are the signs or symptoms? Symptoms of this condition include: A dry cough or a wet (productive) cough. A fever, sweating, or chills. Chest pain, especially when breathing deeply or coughing. Fast breathing, difficulty breathing, or shortness of breath. Tiredness (fatigue) and muscle aches. How is this diagnosed? This condition may be diagnosed based on your medical history or a physical exam. You may also have tests, including: Imaging, such as a chest X-ray or lung ultrasound. Tests of: The level of oxygen and other gases in your blood. Mucus from your lungs (sputum). Fluid around your lungs (pleural fluid). Your urine. How is this treated? Treatment for this condition depends on many factors, such as the cause of your pneumonia, your medicines, and other medical conditions that you have. For most adults, pneumonia may be treated at home. In some cases, treatment must happen in a hospital and may include: Medicines that are given by mouth (orally) or through an IV, including: Antibiotic medicines, if bacteria caused the pneumonia. Medicines that kill viruses (antiviral medicines), if a virus caused the pneumonia. Oxygen therapy. Severe pneumonia, although rare, may require the following treatments: Mechanical ventilation.This procedure uses a machine to help you breathe if you cannot breathe well on your own or maintain a safe level of blood oxygen. Thoracentesis. This procedure removes any buildup of pleural fluid to help with breathing. Follow these instructions at home:  Medicines Take over-the-counter and prescription medicines only as told by your health care provider. Take cough medicine only if you have trouble  sleeping. Cough medicine can prevent your body from removing mucus from  your lungs. If you were prescribed antibiotics, take them as told by your health care provider. Do not stop taking the antibiotic even if you start to feel better. Lifestyle     Do not drink alcohol. Do not use any products that contain nicotine or tobacco. These products include cigarettes, chewing tobacco, and vaping devices, such as e-cigarettes. If you need help quitting, ask your health care provider. Eat a healthy diet. This includes plenty of vegetables, fruits, whole grains, low-fat dairy products, and lean protein. General instructions Rest a lot and get at least 8 hours of sleep each night. Sleep in a partly upright position at night. Place a few pillows under your head or sleep in a reclining chair. Return to your normal activities as told by your health care provider. Ask your health care provider what activities are safe for you. Drink enough fluid to keep your urine pale yellow. This helps to thin the mucus in your lungs. If your throat is sore, gargle with a mixture of salt and water 3-4 times a day or as needed. To make salt water, completely dissolve -1 tsp (3-6 g) of salt in 1 cup (237 mL) of warm water. Keep all follow-up visits. How is this prevented? You can lower your risk of developing community-acquired pneumonia by: Getting the pneumonia vaccine. There are different types and schedules of pneumonia vaccines. Ask your health care provider which option is best for you. Consider getting the pneumonia vaccine if: You are older than 63 years of age. You are 63-70 years of age and are receiving cancer treatment, have chronic lung disease, or have other medical conditions that affect your immune system. Ask your health care provider if this applies to you. Getting your influenza vaccine every year. Ask your health care provider which type of vaccine is best for you. Getting regular dental checkups. Washing your hands often with soap and water for at least 20 seconds. If soap  and water are not available, use hand sanitizer. Contact a health care provider if: You have a fever. You have trouble sleeping because you cannot control your cough with cough medicine. Get help right away if: Your shortness of breath becomes worse. Your chest pain increases. Your sickness becomes worse, especially if you are an older adult or have a weak immune system. You cough up blood. These symptoms may be an emergency. Get help right away. Call 911. Do not wait to see if the symptoms will go away. Do not drive yourself to the hospital. Summary Pneumonia is an infection of the lungs. Community-acquired pneumonia develops in people who have not been in the hospital. It can be caused by bacteria, viruses, or fungi. This condition may be treated with antibiotics or antiviral medicines. Severe pneumonia may require a hospital stay and treatment to help with breathing. This information is not intended to replace advice given to you by your health care provider. Make sure you discuss any questions you have with your health care provider. Document Revised: 04/21/2021 Document Reviewed: 04/21/2021 Elsevier Patient Education  2024 [employer].
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
## 2022-09-02 ENCOUNTER — Encounter: Payer: Self-pay | Admitting: Nurse Practitioner

## 2022-09-02 ENCOUNTER — Ambulatory Visit (INDEPENDENT_AMBULATORY_CARE_PROVIDER_SITE_OTHER): Payer: Medicaid Other | Admitting: Nurse Practitioner

## 2022-09-02 VITALS — BP 127/86 | HR 102 | Temp 97.3°F | Ht 64.0 in | Wt 111.4 lb

## 2022-09-02 DIAGNOSIS — B20 Human immunodeficiency virus [HIV] disease: Secondary | ICD-10-CM | POA: Diagnosis not present

## 2022-09-02 DIAGNOSIS — R0602 Shortness of breath: Secondary | ICD-10-CM

## 2022-09-02 DIAGNOSIS — F1911 Other psychoactive substance abuse, in remission: Secondary | ICD-10-CM

## 2022-09-02 DIAGNOSIS — F17200 Nicotine dependence, unspecified, uncomplicated: Secondary | ICD-10-CM

## 2022-09-02 DIAGNOSIS — J189 Pneumonia, unspecified organism: Secondary | ICD-10-CM | POA: Diagnosis not present

## 2022-09-02 DIAGNOSIS — F1990 Other psychoactive substance use, unspecified, uncomplicated: Secondary | ICD-10-CM | POA: Insufficient documentation

## 2022-09-02 DIAGNOSIS — B370 Candidal stomatitis: Secondary | ICD-10-CM

## 2022-09-02 DIAGNOSIS — F32A Depression, unspecified: Secondary | ICD-10-CM | POA: Insufficient documentation

## 2022-09-02 DIAGNOSIS — F419 Anxiety disorder, unspecified: Secondary | ICD-10-CM

## 2022-09-02 MED ORDER — ALBUTEROL SULFATE HFA 108 (90 BASE) MCG/ACT IN AERS
2.0000 | INHALATION_SPRAY | Freq: Four times a day (QID) | RESPIRATORY_TRACT | 1 refills | Status: DC | PRN
Start: 2022-09-02 — End: 2022-11-10

## 2022-09-02 NOTE — Assessment & Plan Note (Signed)
Does not know what is causing his anxiety and depression Patient denies SI, HI He was referred to the behavioral Health clinic Flowsheet Row Office Visit from 09/02/2022 in [facility]  PHQ-9 Total Score 17          09/02/2022    8:50 AM 09/01/2022   11:56 AM  GAD 7 : Generalized Anxiety Score  Nervous, Anxious, on Edge 0 3  Control/stop worrying 2 2  Worry too much - different things 2 3  Trouble relaxing 2 2  Restless 2   Easily annoyed or irritable 0 1  Afraid - awful might happen 0 3  Total GAD 7 Score 8   Anxiety Difficulty Somewhat difficult Very difficult

## 2022-09-02 NOTE — Assessment & Plan Note (Signed)
Continue nystatin 500,000 units by mouth 4 times daily as ordered

## 2022-09-02 NOTE — Patient Instructions (Addendum)
Please take Tylenol 650 mg every 6 hours as needed for body aches. Please follow-up with the infectious disease clinic as discussed    It is important that you exercise regularly at least 30 minutes 5 times a week as tolerated  Think about what you will eat, plan ahead. Choose " clean, green, fresh or frozen" over canned, processed or packaged foods which are more sugary, salty and fatty. 70 to 75% of food eaten should be vegetables and fruit. Three meals at set times with snacks allowed between meals, but they must be fruit or vegetables. Aim to eat over a 12 hour period , example 7 am to 7 pm, and STOP after  your last meal of the day. Drink water,generally about 64 ounces per day, no other drink is as healthy. Fruit juice is best enjoyed in a healthy way, by EATING the fruit.  Thanks for choosing [facility] we consider it a privelige to serve you.
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
## 2022-09-02 NOTE — Assessment & Plan Note (Addendum)
Already picked up the prescription for azithromycin .patient encouraged to pick up the prescription for Augmentin and prednisone He was encouraged to take all medications as prescribed Follow-up in 4 weeks CLINICAL DATA:  Chest radiograph dated August 13, 2022   EXAM: CHEST - 2 VIEW   COMPARISON:  Chest radiograph dated August 13, 2022   FINDINGS: The heart size and mediastinal contours are within normal limits. Interval improvement of the right upper and middle lobe lung opacities. Persistent atelectasis of the right middle lobe with mild remaining hazy opacities of the right upper and middle lobe suggesting resolving pneumonia. No pleural effusion. The visualized skeletal structures are unremarkable.   IMPRESSION: Interval improvement of the right upper and middle lobe lung opacities. Persistent atelectasis of the right middle lobe with mild remaining hazy opacities of the right upper and middle lobe suggesting resolving pneumonia.

## 2022-09-02 NOTE — Assessment & Plan Note (Signed)
Smokes about 0.5 pack/day  Asked about quitting: confirms that he/she currently smokes cigarettes Advise to quit smoking: Educated about QUITTING to reduce the risk of cancer, cardio and cerebrovascular disease. Assess willingness: Unwilling to quit at this time, but is working on cutting back. Assist with counseling and pharmacotherapy: Counseled for 5 minutes and literature provided. Arrange for follow up: follow up in 1 months and continue to offer help.  

## 2022-09-02 NOTE — Assessment & Plan Note (Signed)
Has upcoming appointment at the infectious disease clinic Patient encouraged to keep that appointment

## 2022-09-02 NOTE — Assessment & Plan Note (Addendum)
Chronic cigarette smoker Albuterol inhaler 2 puffs every 6 hours as needed ordered for shortness of breath Smoking cessation encouraged

## 2022-09-02 NOTE — Assessment & Plan Note (Signed)
Need to avoid use of illicit drugs discussed

## 2022-09-05 ENCOUNTER — Inpatient Hospital Stay (HOSPITAL_COMMUNITY): Payer: Medicaid Other

## 2022-09-05 ENCOUNTER — Emergency Department (HOSPITAL_COMMUNITY): Payer: Medicaid Other

## 2022-09-05 ENCOUNTER — Inpatient Hospital Stay (HOSPITAL_COMMUNITY)
Admission: EM | Admit: 2022-09-05 | Discharge: 2022-10-17 | DRG: 871 | Disposition: A | Discharge status: Home Health | Payer: Medicaid Other | Attending: Family Medicine | Admitting: Family Medicine

## 2022-09-05 DIAGNOSIS — R6521 Severe sepsis with septic shock: Secondary | ICD-10-CM | POA: Diagnosis present

## 2022-09-05 DIAGNOSIS — Z1611 Resistance to penicillins: Secondary | ICD-10-CM | POA: Diagnosis present

## 2022-09-05 DIAGNOSIS — R64 Cachexia: Secondary | ICD-10-CM | POA: Diagnosis present

## 2022-09-05 DIAGNOSIS — J9601 Acute respiratory failure with hypoxia: Secondary | ICD-10-CM | POA: Diagnosis present

## 2022-09-05 DIAGNOSIS — I059 Rheumatic mitral valve disease, unspecified: Secondary | ICD-10-CM | POA: Diagnosis not present

## 2022-09-05 DIAGNOSIS — R401 Stupor: Secondary | ICD-10-CM | POA: Diagnosis not present

## 2022-09-05 DIAGNOSIS — E872 Acidosis, unspecified: Secondary | ICD-10-CM | POA: Diagnosis present

## 2022-09-05 DIAGNOSIS — I081 Rheumatic disorders of both mitral and tricuspid valves: Secondary | ICD-10-CM | POA: Diagnosis present

## 2022-09-05 DIAGNOSIS — F17210 Nicotine dependence, cigarettes, uncomplicated: Secondary | ICD-10-CM | POA: Diagnosis present

## 2022-09-05 DIAGNOSIS — Z1152 Encounter for screening for COVID-19: Secondary | ICD-10-CM

## 2022-09-05 DIAGNOSIS — R4189 Other symptoms and signs involving cognitive functions and awareness: Secondary | ICD-10-CM | POA: Diagnosis present

## 2022-09-05 DIAGNOSIS — Z91199 Patient's noncompliance with other medical treatment and regimen due to unspecified reason: Secondary | ICD-10-CM | POA: Diagnosis not present

## 2022-09-05 DIAGNOSIS — R339 Retention of urine, unspecified: Secondary | ICD-10-CM | POA: Diagnosis not present

## 2022-09-05 DIAGNOSIS — G9340 Encephalopathy, unspecified: Secondary | ICD-10-CM | POA: Insufficient documentation

## 2022-09-05 DIAGNOSIS — I639 Cerebral infarction, unspecified: Secondary | ICD-10-CM | POA: Diagnosis not present

## 2022-09-05 DIAGNOSIS — G40901 Epilepsy, unspecified, not intractable, with status epilepticus: Secondary | ICD-10-CM | POA: Diagnosis present

## 2022-09-05 DIAGNOSIS — I38 Endocarditis, valve unspecified: Secondary | ICD-10-CM | POA: Diagnosis not present

## 2022-09-05 DIAGNOSIS — B20 Human immunodeficiency virus [HIV] disease: Secondary | ICD-10-CM

## 2022-09-05 DIAGNOSIS — I272 Pulmonary hypertension, unspecified: Secondary | ICD-10-CM | POA: Diagnosis not present

## 2022-09-05 DIAGNOSIS — G001 Pneumococcal meningitis: Secondary | ICD-10-CM | POA: Diagnosis present

## 2022-09-05 DIAGNOSIS — F1410 Cocaine abuse, uncomplicated: Secondary | ICD-10-CM | POA: Diagnosis present

## 2022-09-05 DIAGNOSIS — F419 Anxiety disorder, unspecified: Secondary | ICD-10-CM | POA: Diagnosis present

## 2022-09-05 DIAGNOSIS — R7881 Bacteremia: Secondary | ICD-10-CM | POA: Diagnosis not present

## 2022-09-05 DIAGNOSIS — I5189 Other ill-defined heart diseases: Secondary | ICD-10-CM

## 2022-09-05 DIAGNOSIS — R634 Abnormal weight loss: Secondary | ICD-10-CM | POA: Diagnosis not present

## 2022-09-05 DIAGNOSIS — I76 Septic arterial embolism: Secondary | ICD-10-CM | POA: Diagnosis present

## 2022-09-05 DIAGNOSIS — B953 Streptococcus pneumoniae as the cause of diseases classified elsewhere: Secondary | ICD-10-CM | POA: Diagnosis present

## 2022-09-05 DIAGNOSIS — A419 Sepsis, unspecified organism: Secondary | ICD-10-CM | POA: Diagnosis not present

## 2022-09-05 DIAGNOSIS — R4182 Altered mental status, unspecified: Secondary | ICD-10-CM | POA: Diagnosis not present

## 2022-09-05 DIAGNOSIS — G009 Bacterial meningitis, unspecified: Secondary | ICD-10-CM | POA: Diagnosis not present

## 2022-09-05 DIAGNOSIS — F32A Depression, unspecified: Secondary | ICD-10-CM | POA: Diagnosis present

## 2022-09-05 DIAGNOSIS — A403 Sepsis due to Streptococcus pneumoniae: Principal | ICD-10-CM | POA: Diagnosis present

## 2022-09-05 DIAGNOSIS — Z87891 Personal history of nicotine dependence: Secondary | ICD-10-CM | POA: Diagnosis not present

## 2022-09-05 DIAGNOSIS — J154 Pneumonia due to other streptococci: Secondary | ICD-10-CM | POA: Diagnosis present

## 2022-09-05 DIAGNOSIS — B955 Unspecified streptococcus as the cause of diseases classified elsewhere: Secondary | ICD-10-CM | POA: Diagnosis present

## 2022-09-05 DIAGNOSIS — E43 Unspecified severe protein-calorie malnutrition: Secondary | ICD-10-CM | POA: Diagnosis present

## 2022-09-05 DIAGNOSIS — I6340 Cerebral infarction due to embolism of unspecified cerebral artery: Secondary | ICD-10-CM | POA: Diagnosis present

## 2022-09-05 DIAGNOSIS — G9341 Metabolic encephalopathy: Secondary | ICD-10-CM | POA: Diagnosis present

## 2022-09-05 DIAGNOSIS — J9811 Atelectasis: Secondary | ICD-10-CM | POA: Diagnosis present

## 2022-09-05 DIAGNOSIS — G039 Meningitis, unspecified: Secondary | ICD-10-CM | POA: Diagnosis not present

## 2022-09-05 DIAGNOSIS — Z8701 Personal history of pneumonia (recurrent): Secondary | ICD-10-CM

## 2022-09-05 DIAGNOSIS — E785 Hyperlipidemia, unspecified: Secondary | ICD-10-CM | POA: Diagnosis present

## 2022-09-05 DIAGNOSIS — E875 Hyperkalemia: Secondary | ICD-10-CM | POA: Diagnosis present

## 2022-09-05 DIAGNOSIS — Z0120 Encounter for dental examination and cleaning without abnormal findings: Secondary | ICD-10-CM

## 2022-09-05 DIAGNOSIS — J189 Pneumonia, unspecified organism: Secondary | ICD-10-CM | POA: Diagnosis not present

## 2022-09-05 DIAGNOSIS — F1210 Cannabis abuse, uncomplicated: Secondary | ICD-10-CM | POA: Diagnosis present

## 2022-09-05 DIAGNOSIS — I340 Nonrheumatic mitral (valve) insufficiency: Secondary | ICD-10-CM

## 2022-09-05 DIAGNOSIS — I669 Occlusion and stenosis of unspecified cerebral artery: Secondary | ICD-10-CM

## 2022-09-05 DIAGNOSIS — T375X6A Underdosing of antiviral drugs, initial encounter: Secondary | ICD-10-CM | POA: Diagnosis present

## 2022-09-05 DIAGNOSIS — Z8679 Personal history of other diseases of the circulatory system: Secondary | ICD-10-CM

## 2022-09-05 DIAGNOSIS — R7303 Prediabetes: Secondary | ICD-10-CM | POA: Diagnosis present

## 2022-09-05 DIAGNOSIS — Z597 Insufficient social insurance and welfare support: Secondary | ICD-10-CM

## 2022-09-05 DIAGNOSIS — R012 Other cardiac sounds: Secondary | ICD-10-CM | POA: Diagnosis not present

## 2022-09-05 DIAGNOSIS — K047 Periapical abscess without sinus: Secondary | ICD-10-CM | POA: Diagnosis present

## 2022-09-05 DIAGNOSIS — J9600 Acute respiratory failure, unspecified whether with hypoxia or hypercapnia: Secondary | ICD-10-CM | POA: Diagnosis present

## 2022-09-05 DIAGNOSIS — F418 Other specified anxiety disorders: Secondary | ICD-10-CM | POA: Diagnosis not present

## 2022-09-05 DIAGNOSIS — R569 Unspecified convulsions: Secondary | ICD-10-CM | POA: Diagnosis not present

## 2022-09-05 DIAGNOSIS — Z801 Family history of malignant neoplasm of trachea, bronchus and lung: Secondary | ICD-10-CM

## 2022-09-05 DIAGNOSIS — I9589 Other hypotension: Secondary | ICD-10-CM | POA: Diagnosis present

## 2022-09-05 DIAGNOSIS — D638 Anemia in other chronic diseases classified elsewhere: Secondary | ICD-10-CM | POA: Diagnosis present

## 2022-09-05 DIAGNOSIS — I330 Acute and subacute infective endocarditis: Secondary | ICD-10-CM | POA: Diagnosis present

## 2022-09-05 DIAGNOSIS — I10 Essential (primary) hypertension: Secondary | ICD-10-CM | POA: Diagnosis present

## 2022-09-05 DIAGNOSIS — M25511 Pain in right shoulder: Secondary | ICD-10-CM | POA: Diagnosis not present

## 2022-09-05 DIAGNOSIS — I959 Hypotension, unspecified: Secondary | ICD-10-CM | POA: Diagnosis not present

## 2022-09-05 DIAGNOSIS — I700 Atherosclerosis of aorta: Secondary | ICD-10-CM | POA: Diagnosis present

## 2022-09-05 DIAGNOSIS — D72820 Lymphocytosis (symptomatic): Secondary | ICD-10-CM | POA: Diagnosis present

## 2022-09-05 DIAGNOSIS — D649 Anemia, unspecified: Secondary | ICD-10-CM | POA: Insufficient documentation

## 2022-09-05 DIAGNOSIS — I2690 Septic pulmonary embolism without acute cor pulmonale: Secondary | ICD-10-CM | POA: Diagnosis present

## 2022-09-05 DIAGNOSIS — Z8614 Personal history of Methicillin resistant Staphylococcus aureus infection: Secondary | ICD-10-CM

## 2022-09-05 DIAGNOSIS — Z789 Other specified health status: Secondary | ICD-10-CM

## 2022-09-05 DIAGNOSIS — I679 Cerebrovascular disease, unspecified: Secondary | ICD-10-CM | POA: Diagnosis not present

## 2022-09-05 DIAGNOSIS — B9681 Helicobacter pylori [H. pylori] as the cause of diseases classified elsewhere: Secondary | ICD-10-CM | POA: Diagnosis not present

## 2022-09-05 DIAGNOSIS — Z681 Body mass index (BMI) 19 or less, adult: Secondary | ICD-10-CM

## 2022-09-05 DIAGNOSIS — E860 Dehydration: Secondary | ICD-10-CM | POA: Diagnosis present

## 2022-09-05 DIAGNOSIS — R1310 Dysphagia, unspecified: Secondary | ICD-10-CM | POA: Diagnosis present

## 2022-09-05 DIAGNOSIS — I349 Nonrheumatic mitral valve disorder, unspecified: Secondary | ICD-10-CM | POA: Diagnosis not present

## 2022-09-05 DIAGNOSIS — G988 Other disorders of nervous system: Secondary | ICD-10-CM | POA: Diagnosis not present

## 2022-09-05 DIAGNOSIS — J13 Pneumonia due to Streptococcus pneumoniae: Secondary | ICD-10-CM | POA: Diagnosis not present

## 2022-09-05 HISTORY — DX: Altered mental status, unspecified: R41.82

## 2022-09-05 LAB — CBC WITH DIFFERENTIAL/PLATELET
Abs Immature Granulocytes: 0.27 10*3/uL — ABNORMAL HIGH (ref 0.00–0.07)
Abs Immature Granulocytes: 0.23 10*3/uL — ABNORMAL HIGH (ref 0.00–0.07)
Basophils Absolute: 0.1 10*3/uL (ref 0.0–0.1)
Basophils Absolute: 0 10*3/uL (ref 0.0–0.1)
Basophils Relative: 0 %
Basophils Relative: 0 %
Eosinophils Absolute: 0 10*3/uL (ref 0.0–0.5)
Eosinophils Absolute: 0 10*3/uL (ref 0.0–0.5)
Eosinophils Relative: 0 %
Eosinophils Relative: 0 %
HCT: 34.9 % — ABNORMAL LOW (ref 39.0–52.0)
HCT: 29.1 % — ABNORMAL LOW (ref 39.0–52.0)
Hemoglobin: 12.1 g/dL — ABNORMAL LOW (ref 13.0–17.0)
Hemoglobin: 10.1 g/dL — ABNORMAL LOW (ref 13.0–17.0)
Immature Granulocytes: 1 %
Immature Granulocytes: 1 %
Lymphocytes Relative: 11 %
Lymphocytes Relative: 12 %
Lymphs Abs: 2.1 10*3/uL (ref 0.7–4.0)
Lymphs Abs: 2 10*3/uL (ref 0.7–4.0)
MCH: 30.4 pg (ref 26.0–34.0)
MCH: 29.4 pg (ref 26.0–34.0)
MCHC: 34.7 g/dL (ref 30.0–36.0)
MCHC: 34.7 g/dL (ref 30.0–36.0)
MCV: 87.7 fL (ref 80.0–100.0)
MCV: 84.6 fL (ref 80.0–100.0)
Monocytes Absolute: 1.1 10*3/uL — ABNORMAL HIGH (ref 0.1–1.0)
Monocytes Absolute: 1.2 10*3/uL — ABNORMAL HIGH (ref 0.1–1.0)
Monocytes Relative: 6 %
Monocytes Relative: 7 %
Neutro Abs: 16 10*3/uL — ABNORMAL HIGH (ref 1.7–7.7)
Neutro Abs: 13.8 10*3/uL — ABNORMAL HIGH (ref 1.7–7.7)
Neutrophils Relative %: 82 %
Neutrophils Relative %: 80 %
Platelets: 224 10*3/uL (ref 150–400)
Platelets: 170 10*3/uL (ref 150–400)
RBC: 3.98 MIL/uL — ABNORMAL LOW (ref 4.22–5.81)
RBC: 3.44 MIL/uL — ABNORMAL LOW (ref 4.22–5.81)
RDW: 14.2 % (ref 11.5–15.5)
RDW: 14.1 % (ref 11.5–15.5)
WBC: 19.5 10*3/uL — ABNORMAL HIGH (ref 4.0–10.5)
WBC: 17.3 10*3/uL — ABNORMAL HIGH (ref 4.0–10.5)
nRBC: 0 % (ref 0.0–0.2)
nRBC: 0 % (ref 0.0–0.2)

## 2022-09-05 LAB — POCT I-STAT 7, (LYTES, BLD GAS, ICA,H+H)
Acid-Base Excess: 0 mmol/L (ref 0.0–2.0)
Bicarbonate: 24.8 mmol/L (ref 20.0–28.0)
Calcium, Ion: 1.17 mmol/L (ref 1.15–1.40)
HCT: 32 % — ABNORMAL LOW (ref 39.0–52.0)
Hemoglobin: 10.9 g/dL — ABNORMAL LOW (ref 13.0–17.0)
O2 Saturation: 100 %
Patient temperature: 99.4
Potassium: 3.5 mmol/L (ref 3.5–5.1)
Sodium: 135 mmol/L (ref 135–145)
TCO2: 26 mmol/L (ref 22–32)
pCO2 arterial: 40.4 mmHg (ref 32–48)
pH, Arterial: 7.398 (ref 7.35–7.45)
pO2, Arterial: 530 mmHg — ABNORMAL HIGH (ref 83–108)

## 2022-09-05 LAB — RESP PANEL BY RT-PCR (RSV, FLU A&B, COVID)  RVPGX2
Influenza A by PCR: NEGATIVE
Influenza B by PCR: NEGATIVE
Resp Syncytial Virus by PCR: NEGATIVE
SARS Coronavirus 2 by RT PCR: NEGATIVE

## 2022-09-05 LAB — COMPREHENSIVE METABOLIC PANEL
ALT: 31 U/L (ref 0–44)
ALT: 36 U/L (ref 0–44)
AST: 78 U/L — ABNORMAL HIGH (ref 15–41)
AST: 94 U/L — ABNORMAL HIGH (ref 15–41)
Albumin: 2.2 g/dL — ABNORMAL LOW (ref 3.5–5.0)
Albumin: 1.8 g/dL — ABNORMAL LOW (ref 3.5–5.0)
Alkaline Phosphatase: 52 U/L (ref 38–126)
Alkaline Phosphatase: 42 U/L (ref 38–126)
Anion gap: 13 (ref 5–15)
Anion gap: 8 (ref 5–15)
BUN: 16 mg/dL (ref 8–23)
BUN: 10 mg/dL (ref 8–23)
CO2: 22 mmol/L (ref 22–32)
CO2: 23 mmol/L (ref 22–32)
Calcium: 8.2 mg/dL — ABNORMAL LOW (ref 8.9–10.3)
Calcium: 7.3 mg/dL — ABNORMAL LOW (ref 8.9–10.3)
Chloride: 98 mmol/L (ref 98–111)
Chloride: 101 mmol/L (ref 98–111)
Creatinine, Ser: 1.07 mg/dL (ref 0.61–1.24)
Creatinine, Ser: 0.68 mg/dL (ref 0.61–1.24)
GFR, Estimated: >60 mL/min (ref >60)
GFR, Estimated: >60 mL/min (ref >60)
Glucose, Bld: 159 mg/dL — ABNORMAL HIGH (ref 70–99)
Glucose, Bld: 127 mg/dL — ABNORMAL HIGH (ref 70–99)
Potassium: 3.7 mmol/L (ref 3.5–5.1)
Potassium: 3.1 mmol/L — ABNORMAL LOW (ref 3.5–5.1)
Sodium: 133 mmol/L — ABNORMAL LOW (ref 135–145)
Sodium: 132 mmol/L — ABNORMAL LOW (ref 135–145)
Total Bilirubin: 1 mg/dL (ref 0.3–1.2)
Total Bilirubin: 0.8 mg/dL (ref 0.3–1.2)
Total Protein: 7.3 g/dL (ref 6.5–8.1)
Total Protein: 5.9 g/dL — ABNORMAL LOW (ref 6.5–8.1)

## 2022-09-05 LAB — CRYPTOCOCCAL ANTIGEN, CSF: Crypto Ag: NEGATIVE

## 2022-09-05 LAB — PROTEIN AND GLUCOSE, CSF
Glucose, CSF: <20 mg/dL — CL (ref 40–70)
Total  Protein, CSF: >600 mg/dL — ABNORMAL HIGH (ref 15–45)

## 2022-09-05 LAB — PROTIME-INR
INR: 1.5 — ABNORMAL HIGH (ref 0.8–1.2)
Prothrombin Time: 18.5 seconds — ABNORMAL HIGH (ref 11.4–15.2)

## 2022-09-05 LAB — I-STAT VENOUS BLOOD GAS, ED
Acid-base deficit: 3 mmol/L — ABNORMAL HIGH (ref 0.0–2.0)
Bicarbonate: 23.1 mmol/L (ref 20.0–28.0)
Calcium, Ion: 1.09 mmol/L — ABNORMAL LOW (ref 1.15–1.40)
HCT: 38 % — ABNORMAL LOW (ref 39.0–52.0)
Hemoglobin: 12.9 g/dL — ABNORMAL LOW (ref 13.0–17.0)
O2 Saturation: 77 %
Potassium: 3.9 mmol/L (ref 3.5–5.1)
Sodium: 134 mmol/L — ABNORMAL LOW (ref 135–145)
TCO2: 24 mmol/L (ref 22–32)
pCO2, Ven: 45.4 mmHg (ref 44–60)
pH, Ven: 7.314 (ref 7.25–7.43)
pO2, Ven: 45 mmHg (ref 32–45)

## 2022-09-05 LAB — CSF CELL COUNT WITH DIFFERENTIAL
Eosinophils, CSF: 0 % (ref 0–1)
Eosinophils, CSF: 0 % (ref 0–1)
Lymphs, CSF: 10 % — ABNORMAL LOW (ref 40–80)
Lymphs, CSF: 15 % — ABNORMAL LOW (ref 40–80)
Monocyte-Macrophage-Spinal Fluid: 10 % — ABNORMAL LOW (ref 15–45)
Monocyte-Macrophage-Spinal Fluid: 8 % — ABNORMAL LOW (ref 15–45)
RBC Count, CSF: 535 /mm3 — ABNORMAL HIGH
RBC Count, CSF: 6425 /mm3 — ABNORMAL HIGH
Segmented Neutrophils-CSF: 75 % — ABNORMAL HIGH (ref 0–6)
Segmented Neutrophils-CSF: 82 % — ABNORMAL HIGH (ref 0–6)
Tube #: 1
Tube #: 4
WBC, CSF: 1045 /mm3 (ref 0–5)
WBC, CSF: 1750 /mm3 (ref 0–5)

## 2022-09-05 LAB — LACTIC ACID, PLASMA
Lactic Acid, Venous: 4.5 mmol/L (ref 0.5–1.9)
Lactic Acid, Venous: 3.8 mmol/L (ref 0.5–1.9)

## 2022-09-05 LAB — CBG MONITORING, ED: Glucose-Capillary: 171 mg/dL — ABNORMAL HIGH (ref 70–99)

## 2022-09-05 LAB — CSF CULTURE W GRAM STAIN

## 2022-09-05 LAB — PROCALCITONIN: Procalcitonin: 40.71 ng/mL

## 2022-09-05 LAB — MRSA NEXT GEN BY PCR, NASAL: MRSA by PCR Next Gen: NOT DETECTED

## 2022-09-05 LAB — APTT: aPTT: 34 seconds (ref 24–36)

## 2022-09-05 LAB — HIV ANTIBODY (ROUTINE TESTING W REFLEX): HIV Screen 4th Generation wRfx: REACTIVE — AB

## 2022-09-05 MED ORDER — METRONIDAZOLE 500 MG/100ML IV SOLN
500.0000 mg | Freq: Once | INTRAVENOUS | Status: AC
  Administered 2022-09-05: 500 mg via INTRAVENOUS
  Filled 2022-09-05: qty 100

## 2022-09-05 MED ORDER — LACTATED RINGERS IV BOLUS: 1000.0000 mL | Freq: Once | INTRAVENOUS | Status: DC

## 2022-09-05 MED ORDER — FAMOTIDINE 20 MG PO TABS
20.0000 mg | ORAL_TABLET | Freq: Two times a day (BID) | ORAL | Status: DC
  Administered 2022-09-05 – 2022-09-07 (×5): 20 mg
  Filled 2022-09-05 (×5): qty 1

## 2022-09-05 MED ORDER — POLYETHYLENE GLYCOL 3350 17 G PO PACK: 17.0000 g | PACK | Freq: Every day | ORAL | Status: DC | PRN

## 2022-09-05 MED ORDER — SODIUM CHLORIDE 0.9 % IV SOLN: 2.0000 g | Freq: Two times a day (BID) | INTRAVENOUS | Status: DC

## 2022-09-05 MED ORDER — FENTANYL CITRATE PF 50 MCG/ML IJ SOSY
100.0000 ug | PREFILLED_SYRINGE | Freq: Once | INTRAMUSCULAR | Status: AC
  Administered 2022-09-05: 100 ug via INTRAVENOUS

## 2022-09-05 MED ORDER — ONDANSETRON HCL 4 MG/2ML IJ SOLN: 4.0000 mg | Freq: Four times a day (QID) | INTRAMUSCULAR | Status: DC | PRN

## 2022-09-05 MED ORDER — DOCUSATE SODIUM 50 MG/5ML PO LIQD
100.0000 mg | Freq: Two times a day (BID) | ORAL | Status: DC
  Administered 2022-09-05 – 2022-09-07 (×5): 100 mg
  Filled 2022-09-05 (×5): qty 10

## 2022-09-05 MED ORDER — SODIUM CHLORIDE 0.9 % IV SOLN
2.0000 g | Freq: Two times a day (BID) | INTRAVENOUS | Status: AC
  Administered 2022-09-05 – 2022-09-26 (×43): 2 g via INTRAVENOUS
  Filled 2022-09-05 (×43): qty 20

## 2022-09-05 MED ORDER — ETOMIDATE 2 MG/ML IV SOLN
10.0000 mg | Freq: Once | INTRAVENOUS | Status: AC
  Administered 2022-09-05: 10 mg via INTRAVENOUS

## 2022-09-05 MED ORDER — ACETAMINOPHEN 500 MG PO TABS: 1000.0000 mg | ORAL_TABLET | ORAL | Status: AC

## 2022-09-05 MED ORDER — ORAL CARE MOUTH RINSE
15.0000 mL | OROMUCOSAL | Status: DC
  Administered 2022-09-05 – 2022-09-07 (×24): 15 mL via OROMUCOSAL

## 2022-09-05 MED ORDER — SODIUM CHLORIDE 0.9 % IV SOLN: INTRAVENOUS | Status: DC

## 2022-09-05 MED ORDER — POLYETHYLENE GLYCOL 3350 17 G PO PACK
17.0000 g | PACK | Freq: Every day | ORAL | Status: DC
  Administered 2022-09-07: 17 g
  Filled 2022-09-05: qty 1

## 2022-09-05 MED ORDER — LACTATED RINGERS IV SOLN: INTRAVENOUS | Status: AC

## 2022-09-05 MED ORDER — FENTANYL 2500MCG IN NS 250ML (10MCG/ML) PREMIX INFUSION
0.0000 ug/h | INTRAVENOUS | Status: DC
  Administered 2022-09-05: 25 ug/h via INTRAVENOUS
  Administered 2022-09-06: 50 ug/h via INTRAVENOUS
  Filled 2022-09-05 (×2): qty 250

## 2022-09-05 MED ORDER — ORAL CARE MOUTH RINSE: 15.0000 mL | OROMUCOSAL | Status: DC | PRN

## 2022-09-05 MED ORDER — FENTANYL CITRATE PF 50 MCG/ML IJ SOSY
PREFILLED_SYRINGE | INTRAMUSCULAR | Status: AC
  Filled 2022-09-05: qty 2

## 2022-09-05 MED ORDER — SODIUM CHLORIDE 0.9 % IV SOLN
2.0000 g | INTRAVENOUS | Status: DC
  Administered 2022-09-05 – 2022-09-06 (×3): 2 g via INTRAVENOUS
  Filled 2022-09-05 (×3): qty 2000

## 2022-09-05 MED ORDER — VANCOMYCIN HCL IN DEXTROSE 1-5 GM/200ML-% IV SOLN
1000.0000 mg | Freq: Once | INTRAVENOUS | Status: AC
  Administered 2022-09-05: 1000 mg via INTRAVENOUS
  Filled 2022-09-05: qty 200

## 2022-09-05 MED ORDER — SODIUM CHLORIDE 0.9 % IV SOLN: 3.0000 g | Freq: Four times a day (QID) | INTRAVENOUS | Status: DC

## 2022-09-05 MED ORDER — LEVETIRACETAM IN NACL 1000 MG/100ML IV SOLN
1000.0000 mg | Freq: Once | INTRAVENOUS | Status: AC
  Administered 2022-09-05: 1000 mg via INTRAVENOUS
  Filled 2022-09-05: qty 100

## 2022-09-05 MED ORDER — ENOXAPARIN SODIUM 30 MG/0.3ML IJ SOSY
30.0000 mg | PREFILLED_SYRINGE | INTRAMUSCULAR | Status: DC
  Administered 2022-09-05: 30 mg via SUBCUTANEOUS
  Filled 2022-09-05: qty 0.3

## 2022-09-05 MED ORDER — SODIUM CHLORIDE 0.9 % IV SOLN
2.0000 g | Freq: Once | INTRAVENOUS | Status: AC
  Administered 2022-09-05: 2 g via INTRAVENOUS
  Filled 2022-09-05: qty 12.5

## 2022-09-05 MED ORDER — SODIUM CHLORIDE 0.9 % IV SOLN
2.0000 g | Freq: Four times a day (QID) | INTRAVENOUS | Status: DC
  Administered 2022-09-05: 2 g via INTRAVENOUS
  Filled 2022-09-05: qty 2000

## 2022-09-05 MED ORDER — ACETAMINOPHEN 325 MG PO TABS
650.0000 mg | ORAL_TABLET | Freq: Four times a day (QID) | ORAL | Status: DC | PRN
  Administered 2022-09-06 – 2022-09-07 (×2): 650 mg via NASOGASTRIC
  Filled 2022-09-05 (×2): qty 2

## 2022-09-05 MED ORDER — PROPOFOL 1000 MG/100ML IV EMUL
INTRAVENOUS | Status: AC
  Administered 2022-09-05: 20 ug/kg/min
  Filled 2022-09-05: qty 100

## 2022-09-05 MED ORDER — ACETAMINOPHEN 650 MG RE SUPP
650.0000 mg | Freq: Once | RECTAL | Status: AC
  Administered 2022-09-05: 650 mg via RECTAL
  Filled 2022-09-05: qty 1

## 2022-09-05 MED ORDER — VANCOMYCIN HCL 750 MG/150ML IV SOLN: 750.0000 mg | INTRAVENOUS | Status: DC

## 2022-09-05 MED ORDER — LACTATED RINGERS IV BOLUS (SEPSIS)
1000.0000 mL | Freq: Once | INTRAVENOUS | Status: AC
  Administered 2022-09-05: 1000 mL via INTRAVENOUS

## 2022-09-05 MED ORDER — VANCOMYCIN HCL 750 MG/150ML IV SOLN
750.0000 mg | Freq: Two times a day (BID) | INTRAVENOUS | Status: DC
  Administered 2022-09-06: 750 mg via INTRAVENOUS
  Filled 2022-09-05 (×2): qty 150

## 2022-09-05 MED ORDER — DEXTROSE 5 % IV SOLN
500.0000 mg | Freq: Two times a day (BID) | INTRAVENOUS | Status: DC
  Administered 2022-09-05: 500 mg via INTRAVENOUS
  Filled 2022-09-05 (×2): qty 10

## 2022-09-05 MED ORDER — PROPOFOL 1000 MG/100ML IV EMUL
0.0000 ug/kg/min | INTRAVENOUS | Status: DC
  Administered 2022-09-05 – 2022-09-06 (×2): 40 ug/kg/min via INTRAVENOUS
  Administered 2022-09-06 – 2022-09-07 (×2): 25 ug/kg/min via INTRAVENOUS
  Filled 2022-09-05 (×4): qty 100

## 2022-09-05 MED ORDER — DEXTROSE 5 % IV SOLN
500.0000 mg | Freq: Three times a day (TID) | INTRAVENOUS | Status: DC
  Administered 2022-09-06: 500 mg via INTRAVENOUS
  Filled 2022-09-05 (×3): qty 10

## 2022-09-05 MED ORDER — SUCCINYLCHOLINE CHLORIDE 200 MG/10ML IV SOSY
100.0000 mg | PREFILLED_SYRINGE | Freq: Once | INTRAVENOUS | Status: AC
  Administered 2022-09-05: 100 mg via INTRAVENOUS

## 2022-09-05 MED ORDER — IOHEXOL 350 MG/ML SOLN
75.0000 mL | Freq: Once | INTRAVENOUS | Status: AC | PRN
  Administered 2022-09-05: 75 mL via INTRAVENOUS

## 2022-09-05 MED ORDER — DOCUSATE SODIUM 100 MG PO CAPS: 100.0000 mg | ORAL_CAPSULE | Freq: Two times a day (BID) | ORAL | Status: DC | PRN

## 2022-09-05 NOTE — Sepsis Progress Note (Signed)
Asking bedside RN to check on lactic acid drawn @ 1113, it has not resulted

## 2022-09-05 NOTE — Procedures (Signed)
Lumbar Puncture Procedure Note  Christopher Farrell  [id number]  [birth year]  Date:09/05/22  Time:8:10 PM   Provider Performing:Rossie Scarfone   Procedure: Lumbar Puncture (40981)  Indication(s) Rule out meningitis  Consent Unable to obtain consent due to emergent nature of procedure.  Anesthesia Topical only with 1% lidocaine    Time Out Verified patient identification, verified procedure, site/side was marked, verified correct patient position, special equipment/implants available, medications/allergies/relevant history reviewed, required imaging and test results available.   Sterile Technique Maximal sterile technique including sterile barrier drape, hand hygiene, sterile gown, sterile gloves, mask, hair covering.    Procedure Description Using palpation, approximate location of L3-L4 space identified.   Lidocaine used to anesthetize skin and subcutaneous tissue overlying this area.  A 20g spinal needle was then used to access the subarachnoid space. Opening pressure:Not obtained. Closing pressure:Not obtained. 20 CSF obtained. Turbid  Complications/Tolerance None; patient tolerated the procedure well.   EBL Minimal   Specimen(s) CSF  [name], MD [facility] ICU Physician [facility]  Pager: [phone] Or Epic Secure Chat After hours: [phone].  09/05/2022, 8:11 PM

## 2022-09-05 NOTE — Progress Notes (Signed)
Pt transported to 4N28 on vent w/o any complications

## 2022-09-05 NOTE — ED Notes (Signed)
EEG at bedside.

## 2022-09-05 NOTE — ED Notes (Signed)
Assunta Gambles, RN and respiratory therapy to transport to [phone]

## 2022-09-05 NOTE — Procedures (Signed)
Intubation Procedure Note  Christopher Farrell  [id number]  1960-12-09  Date:09/05/22  Time:7:00 PM   Provider Performing:Patryce Depriest Chelsea Aus    Procedure: Intubation (31500)  Indication(s) Respiratory Failure  Consent Unable to obtain consent due to emergent nature of procedure.   Anesthesia Rocuronium and Succinylcholine   Time Out Verified patient identification, verified procedure, site/side was marked, verified correct patient position, special equipment/implants available, medications/allergies/relevant history reviewed, required imaging and test results available.   Sterile Technique Usual hand hygeine, masks, and gloves were used   Procedure Description Patient positioned in bed supine.  Sedation given as noted above.  Patient was intubated with endotracheal tube using Glidescope.  View was Grade 1 full glottis .  Number of attempts was 1.  Colorimetric CO2 detector was consistent with tracheal placement.   Complications/Tolerance None; patient tolerated the procedure well. Chest X-ray is ordered to verify placement.   EBL Minimal   Specimen(s) None

## 2022-09-05 NOTE — Assessment & Plan Note (Addendum)
Patient has not been taking his Biktarvy. Leukocytosis of 19 present. - HIV antibodies and T cell count - Consider restarting Biktarvy, per ICU recs

## 2022-09-05 NOTE — Progress Notes (Addendum)
Date and time results received: 09/05/22 2025 (use smartphrase ".now" to insert current time)  Test: CSF Critical Value:   Tube #1 - WBC - 1045  Tube #4 - WBC - 1750  Name of Provider Notified: E-link   Orders Received? Or Actions Taken? Spoke to MD [facility] and no new orders at this time. Continue plan of care.

## 2022-09-05 NOTE — Assessment & Plan Note (Addendum)
AMS likely in the setting of PNA - Cefepime and Vancomycin on board - CCM will monitor improvement

## 2022-09-05 NOTE — ED Notes (Signed)
RN asked doctor to come lay eyes on patient, LR order but patient is rattling and wet sounding also very agitated with pulse in 120's. EDP at bedside

## 2022-09-05 NOTE — ED Notes (Signed)
Patient transported to CT 
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
## 2022-09-05 NOTE — Assessment & Plan Note (Addendum)
Patient was diagnosed with multifocal PNA 06/26 and was sent home with a course of Azithromycin and Augmentin. Patient was brought into the ED via EMS today after being found unresponsive with an episode of diarrhea. Family reports patient has been getting sicker over the past few days and more altered than yesterday at 2 PM. Repeat XR and CT chest showed multifocal PNA and emphysema. Patient was unarousable except upon sternal rub. With his respiratory and mental status, CCM was consulted because we believe patient would require intubation. - Cefepime and Vancomycin started in the ED - CCM and Neuro consulted - Dispo: ICU

## 2022-09-05 NOTE — Assessment & Plan Note (Addendum)
Patient presented with a 2 minute seizure-like event when being examined in the room. Patient had a previous incident about 30 minutes prior which lasted about 1 minute. Activity resolved itself, patient was postictal. - Neuro consulted - EEG pending

## 2022-09-05 NOTE — Assessment & Plan Note (Addendum)
By report, patient has not been taking his [facility] outpatient. While in the ICU patient was restarted on Tivicay and Descovy. - Continue Tivicay and Descovy.

## 2022-09-05 NOTE — ED Notes (Signed)
Help get patient cleaned up on the monitor placed a brief patient is resting with call bell in reach

## 2022-09-05 NOTE — Assessment & Plan Note (Signed)
Patient presented with a 2 minute seizure-like event when being examined in the room. Per RN report, patient had a previous incident about 30 minutes prior which lasted about 1 minute. Activity resolved spontaneous and patient was postictal. - Neuro consulted - EEG pending - Remainder of management for CCM

## 2022-09-05 NOTE — Sepsis Progress Note (Signed)
Notified bedside nurse of need to draw repeat lactic acid. 

## 2022-09-05 NOTE — ED Notes (Signed)
Niece Donnelly Angelica [phone]

## 2022-09-05 NOTE — Progress Notes (Signed)
LTM maint complete - no skin breakdown . Headbox switched out . [facility] monitored,

## 2022-09-05 NOTE — ED Triage Notes (Signed)
Pt BIB [facility] from home due to becoming unresponsive.  Pt lives with mother and LKW 1400 09/04/22.  Pt had paperwork at home that states he has been getting treated for pneumonia and has been taking his medications.  Pt was found in puddle of diarrhea. Pt is HIV positive. 20g left forearm.  VS HR 105, BP 120/81

## 2022-09-05 NOTE — ED Notes (Signed)
2nd Lactic sent

## 2022-09-05 NOTE — Progress Notes (Signed)
CALL PAGER [phone] for any questions or notifications regarding this patient  FMTS Attending Note: Denny Levy MD I agree with the assessment and plan as documented in the resident's note.  Full H&P is currently under progress.   Briefly:I examined patient in ED with Drs. Sanford and [place]. Patient had just had a 2 minute seizure, terminated on its own. He is not arousable except to sternal rub and he will turn his hide and moan. Reportedly he has not been very responsive since admission to ED.  - His lung exam reveals coarse breath sounds in all right sided lung fields. - Preliminary lab work: WBC elevated at [address]. VBG pH + 7.3 and lactic acid 4.5 trended down to 3.8. - CT chest reveals multifocal pneumonia on right with underlying emphysematous changes. Assessment:  severe sepsis, likely source is pneumonia with unknown organism, in HIV patient who is not currently taking his ART. Altered mental status. Concern for his ability to protect his airway. PLAN: consult ICU as I believe he will need intubation soon and is too ill for regular  med/surg or progressive bed. We will be happy to resume care once he is stable enough for progressive care.
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
## 2022-09-05 NOTE — Progress Notes (Addendum)
LTM EEG hooked up and running - no initial skin breakdown - push button tested - [facility] monitoring. MRI compatible leads placed.

## 2022-09-05 NOTE — Progress Notes (Signed)
Went to bedside and MD was at bedside ready to intubate and do a spinal tap. Try back later, or as schedule permits.. Also per [place], pt is to be an LTM not a routine and please use MRI leads

## 2022-09-05 NOTE — ED Provider Notes (Signed)
  Physical Exam  BP 111/73 (BP Location: Right Arm)   Pulse (!) 103   Temp 99.3 F (37.4 C) (Axillary)   Resp (!) 25   SpO2 94%   No significant change in physical exam from prior provider documentation.  Procedures  Procedures  ED Course / MDM   Clinical Course as of 09/05/22 1514  Mon Sep 05, 2022  1200 HCT(!): 34.9 [WF]    Clinical Course User Index [WF] Gailen Shelter, [place]   Medical Decision Making Received in handoff, 62 year old male history of HIV here for altered mental status suspected to have sepsis secondary to pneumonia seen on the chest x-ray and CT scan.  Awaiting formal read on CT.  Likely admission.  CT shows multifocal pneumonia, I spoke with the family medicine team graciously agreed to admit the patient.  Amount and/or Complexity of Data Reviewed External Data Reviewed: radiology. Labs: ordered. Decision-making details documented in ED Course. Radiology: ordered. ECG/medicine tests: ordered.  Risk OTC drugs. Prescription drug management. Decision regarding hospitalization.         Fayrene Helper, MD 09/05/22 [id number]    Blane Ohara, MD 09/06/22 [phone]

## 2022-09-05 NOTE — H&P (Addendum)
Hospital Admission History and Physical Service Pager: [phone]  Patient name: Christopher Farrell Medical record number: [id number] Date of Birth: [birth year] Age: 62 y.o. Gender: male  Primary Care Provider: Pcp, No Consultants: CCM, Neuro Code Status: Unknown - must contact family to confirm if patient unable to confirm  Preferred Emergency Contact: Mother - Maxden Hailu  Chief Complaint: cough  Assessment and Plan: DAWOUD HOWLE is a 62 y.o. male with a history of HIV not on antiretrovirals presenting with altered mental status. Meeting sepsis criteria, apparently secondary to multifocal pneumonia.  Hospital Problem List      Hospital     * (Principal) Altered mental status     Uncertain etiology.  Possibly due to his multifocal pneumonia, however  cannot be sure.  Given history of HIV not on antiretrovirals for quite  some time, he is at risk for many different opportunistic infections.   Patient noted of increased work of breathing and coarse lung sounds  throughout on our exam and was minimally responsive, therefore CCM  consulted who intubated patient and subsequently took him to the ICU. - Cefepime and Vancomycin on board -ICU management for now, FMTS will be happy to resume management once  floor stable         Witnessed seizure-like activity [facility])     Patient presented with a 2 minute seizure-like event when being  examined in the room. Per RN report, patient had a previous incident about  30 minutes prior which lasted about 1 minute. Activity resolved  spontaneous and patient was postictal. - Neuro consulted - EEG pending - Remainder of management for CCM         HIV infection (HCC)     By report, patient has not been taking his Biktarvy.  - Will need to be worked up for opportunistic infections - Anticipate would benefit from ID assistance  - HIV antibodies and T cell count - Consider restarting Biktarvy, per ICU recs        FEN/GI: NPO VTE  Prophylaxis: per ICU  Disposition: CCM  History of Present Illness:  Christopher Farrell is a 62 y.o. male presenting with altered mental status secondary to sepsis. Patient was seen in the ED on 06/26 with generalized body aches and was found to have multifocal pneumonia, was discharged home on Augmentin and Azithromycin and oral nystatin for oral candida. Patient has a pertinent history of HIV, not on anti-retrovirals despite symptomatic improvement. He follows with ID, but no T cell count has been done within the last year. EMS was called and discovered upon arrival that he had an episode of diarrhea in his pants and was unresponsive.  In the ED, patient presented with a cough and became sicker over the past few days and was minimally responsive today. His family was at bedside and was able to confirm that the patient has been taking his antibiotics. He presented with a fever of 103.1, XR and CT continue to demonstrate the multifocal pneumonia. Leukocytosis was appreciated on labs. Patient was started on vancomycin, flagyl, and cefepime.   Patient had 2 witnessed seizure like events with shaking of the left arm, upward gaze and contracture of the RUE and BLE. The longest of these was 2 min long and self-resolving.   Review Of Systems: Per HPI with the following additions: patient was unable to cooperate  Pertinent Past Medical History: HIV Remainder reviewed in history tab.   Pertinent Past Surgical History: None Remainder reviewed in  history tab.   Pertinent Social History: Tobacco use: Yes, some days Alcohol use: Yes Other Substance use: Cocaine and Marijuana Lives with mother  Pertinent Family History: Father - deceased, lung cancer  Remainder reviewed in history tab.   Important Outpatient Medications: **Unreviewed** Azithromycin 250 mg Augmentin 875-125 mg  Nystatin  Albuterol 108 (90 base) MCG/ACT inhaler Biktarvy 50-200-25 mg (seems to have been off for the past  year) Prednisone 20 mg  Remainder reviewed in medication history.   Objective: BP 111/73 (BP Location: Right Arm)   Pulse (!) 103   Temp 99.3 F (37.4 C) (Axillary)   Resp (!) 25   SpO2 100%  Exam: Patient had a 2 minute seizure while in the room. As above with shaking of the left arm, upward gaze and contracture of the RUE and BLE. The longest of these was 2 min long and self-resolving.  General: cachectic, unarousable, except upon sternal rub, post-ictal Cardiovascular: RRR. Trasmitted coarse breath sounds Respiratory: coarse breath sounds auscultated over upper and lower right lung fields, diminished posteriorly  Gastrointestinal: soft, nontender, nondistended MSK: no swelling or edema noted   Labs:  CBC BMET  Recent Labs  Lab 09/05/22 1113 09/05/22 1226  WBC 19.5*  --   HGB 12.1* 12.9*  HCT 34.9* 38.0*  PLT 224  --    Recent Labs  Lab 09/05/22 1113 09/05/22 1226  NA 133* 134*  K 3.7 3.9  CL 98  --   CO2 22  --   BUN 16  --   CREATININE 1.07  --   GLUCOSE 159*  --   CALCIUM 8.2*  --     Pertinent additional labs: Lactic acid: 4.5 --> 3.8 PT/INR: 18.5/1.5 VBG: pH 7.314, pCO2 45.4, HCO3 23.1  EKG: sinus tachycardia   Imaging Studies Performed:  CXR Impression from Radiologist: Hyperinflation. Persistent subtle right midlung opacity. Recommend follow-up My Interpretation: opacity noted over the right lung  CT Chest Impression from Radiologist:  1. Negative examination for pulmonary embolism. 2. Dense, heterogeneous nodular and consolidative airspace opacity throughout the right lung, particularly in the inferior right upper lobe, although also seen in the lateral segment right middle lobe. Findings are consistent with multifocal infection. 3. Diffuse bilateral bronchial wall thickening and extensive bronchiolar plugging, consistent with nonspecific infectious or inflammatory bronchitis. 4. Emphysema. 5. Coronary artery disease.  CT Abdomen  Pelvis Impression from Radiologist: 1. No acute CT findings of the abdomen or pelvis to explain abdominal pain. 2. Colon is fluid-filled to the rectum, suggestive of diarrheal illness.  CT Head w/o contrast Impression from Radiologist: No acute intracranial abnormality.   Independently reviewed and agree with radiologist interpretation.  Fortunato Curling DO 09/05/2022, 6:34 PM PGY-1, [facility]  FPTS Intern pager: [phone], text pages welcome Secure chat group [facility] Teaching Service    I have evaluated this patient along with Dr. Fatima Blank and reviewed the above note, making necessary revisions.  Dorothyann Gibbs, MD 09/05/2022, 6:37 PM PGY-2, [facility]

## 2022-09-05 NOTE — Progress Notes (Signed)
Pharmacy Antibiotic Note  Christopher Farrell is a 62 y.o. male admitted on 09/05/2022 with rule out meningitis. Pharmacy has been consulted for ceftriaxone, ampicillin, acyclovir, and vancomycin dosing.  Patient received vancomycin 1g IV x 1 in the ER at 1354.  SCr improved to 0.68 this evening.  Plan: Continue vancomycin 750mg  IV q12h. Goal trough 15-20 Ceftriaxone 2g IV q12h Adjust ampicillin to 2g IV q4h Adjust acyclovir to 500mg  (10mg /kg) IV q8h + NS at 75 ml/hr Monitor clinical progress, c/s, renal function F/u de-escalation plan/LOT, vancomycin trough at steady state       Temp (24hrs), Avg:101.2 F (38.4 C), Min:99.3 F (37.4 C), Max:103.1 F (39.5 C)  Recent Labs  Lab 08/31/22 1440 09/05/22 1113 09/05/22 1517 09/05/22 1855  WBC 8.1 19.5*  --  17.3*  CREATININE 0.82 1.07  --  0.68  LATICACIDVEN  --  4.5* 3.8*  --      Estimated Creatinine Clearance: 69.3 mL/min (by C-G formula based on SCr of 0.68 mg/dL).    No Known Allergies   Leia Alf, PharmD, BCPS Please check AMION for all [facility] Pharmacy contact numbers Clinical Pharmacist 09/05/2022 9:28 PM

## 2022-09-05 NOTE — Assessment & Plan Note (Addendum)
Patient was admitted for bacterial meningitis.  Mentation has improved since admission.  He is aware of his name, location, month.  - assess mental status daily to monitor improvement
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
## 2022-09-05 NOTE — Sepsis Progress Note (Signed)
Called lab about 1332 lactic acid, they state they do not have the blood. Notified nurse at bedside

## 2022-09-05 NOTE — Progress Notes (Addendum)
Pharmacy Antibiotic Note  RAEL PHI is a 62 y.o. male admitted on 09/05/2022 with meningitis.  Pharmacy has been consulted for ampicillin / iv Acyclovir  dosing.  Plan: Ampicillin 2 grams iv Q 4 hours Ceftriaxone 2 grams iv Q 12 hours Acyclovir 500 mg iv Q 8 hours with NS at 75 ml / hr      Temp (24hrs), Avg:101.2 F (38.4 C), Min:99.3 F (37.4 C), Max:103.1 F (39.5 C)  Recent Labs  Lab 08/31/22 1440 09/05/22 1113 09/05/22 1517  WBC 8.1 19.5*  --   CREATININE 0.82 1.07  --   LATICACIDVEN  --  4.5* 3.8*    Estimated Creatinine Clearance: 51.8 mL/min (by C-G formula based on SCr of 1.07 mg/dL).    No Known Allergies    Thank you for allowing pharmacy to be a part of this patient's care.  Elwin Sleight 09/05/2022 6:59 PM

## 2022-09-05 NOTE — Sepsis Progress Note (Signed)
Elink monitoring for the code sepsis protocol.  

## 2022-09-05 NOTE — Progress Notes (Signed)
Pharmacy Antibiotic Note  Christopher Farrell is a 62 y.o. male admitted on 09/05/2022 presenting with with AMS, hx recent pna.  Pharmacy has been consulted for vancomycin and cefepime dosing.  Plan: Vancomycin 1g IV x 1, then 750 mg IV q 24h (eAUC 435) Add MRSA PCR Cefepime 2g IV every 12 hours Monitor renal function, Cx and clinical progression to narrow Vancomycin levels as indicated     Temp (24hrs), Avg:103.1 F (39.5 C), Min:103.1 F (39.5 C), Max:103.1 F (39.5 C)  Recent Labs  Lab 08/31/22 1440 09/05/22 1113  WBC 8.1 19.5*  CREATININE 0.82 1.07    Estimated Creatinine Clearance: 51.8 mL/min (by C-G formula based on SCr of 1.07 mg/dL).    No Known Allergies  Daylene Posey, PharmD, [facility] Clinical Pharmacist ED Pharmacist Phone # [phone] 09/05/2022 12:57 PM

## 2022-09-06 ENCOUNTER — Inpatient Hospital Stay (HOSPITAL_COMMUNITY): Payer: Medicaid Other

## 2022-09-06 DIAGNOSIS — B20 Human immunodeficiency virus [HIV] disease: Secondary | ICD-10-CM

## 2022-09-06 DIAGNOSIS — A419 Sepsis, unspecified organism: Secondary | ICD-10-CM

## 2022-09-06 DIAGNOSIS — R012 Other cardiac sounds: Secondary | ICD-10-CM

## 2022-09-06 DIAGNOSIS — G40901 Epilepsy, unspecified, not intractable, with status epilepticus: Secondary | ICD-10-CM

## 2022-09-06 DIAGNOSIS — G009 Bacterial meningitis, unspecified: Secondary | ICD-10-CM | POA: Insufficient documentation

## 2022-09-06 DIAGNOSIS — E43 Unspecified severe protein-calorie malnutrition: Secondary | ICD-10-CM | POA: Diagnosis present

## 2022-09-06 DIAGNOSIS — R569 Unspecified convulsions: Secondary | ICD-10-CM

## 2022-09-06 LAB — HIV-1 RNA QUANT-NO REFLEX-BLD
HIV 1 RNA Quant: 102000 copies/mL
LOG10 HIV-1 RNA: 5.009 log10copy/mL

## 2022-09-06 LAB — BASIC METABOLIC PANEL
Anion gap: 9 (ref 5–15)
BUN: 11 mg/dL (ref 8–23)
CO2: 19 mmol/L — ABNORMAL LOW (ref 22–32)
Calcium: 7.3 mg/dL — ABNORMAL LOW (ref 8.9–10.3)
Chloride: 106 mmol/L (ref 98–111)
Creatinine, Ser: 0.63 mg/dL (ref 0.61–1.24)
GFR, Estimated: >60 mL/min (ref >60)
Glucose, Bld: 98 mg/dL (ref 70–99)
Potassium: 3.4 mmol/L — ABNORMAL LOW (ref 3.5–5.1)
Sodium: 134 mmol/L — ABNORMAL LOW (ref 135–145)

## 2022-09-06 LAB — ECHOCARDIOGRAM COMPLETE
AR max vel: 2.64 cm2
AV Peak grad: 5.7 mmHg
Ao pk vel: 1.19 m/s
Area-P 1/2: 4.68 cm2
S' Lateral: 2.36 cm
Weight: 1841.28 oz

## 2022-09-06 LAB — BLOOD CULTURE ID PANEL (REFLEXED) - BCID2

## 2022-09-06 LAB — MENINGITIS/ENCEPHALITIS PANEL (CSF)
Cryptococcus neoformans/gattii (CSF): NOT DETECTED
Cytomegalovirus (CSF): NOT DETECTED
Enterovirus (CSF): NOT DETECTED
Escherichia coli K1 (CSF): NOT DETECTED
Haemophilus influenzae (CSF): NOT DETECTED
Herpes simplex virus 1 (CSF): NOT DETECTED
Herpes simplex virus 2 (CSF): NOT DETECTED
Human herpesvirus 6 (CSF): NOT DETECTED
Human parechovirus (CSF): NOT DETECTED
Listeria monocytogenes (CSF): NOT DETECTED
Neisseria meningitis (CSF): NOT DETECTED
Streptococcus agalactiae (CSF): NOT DETECTED
Streptococcus pneumoniae (CSF): DETECTED — AB
Varicella zoster virus (CSF): NOT DETECTED

## 2022-09-06 LAB — HEMOGLOBIN A1C
Hgb A1c MFr Bld: 6.4 % — ABNORMAL HIGH (ref 4.8–5.6)
Mean Plasma Glucose: 136.98 mg/dL

## 2022-09-06 LAB — RAPID URINE DRUG SCREEN, HOSP PERFORMED
Amphetamines: NOT DETECTED
Barbiturates: NOT DETECTED
Benzodiazepines: NOT DETECTED
Cocaine: NOT DETECTED
Opiates: NOT DETECTED
Tetrahydrocannabinol: POSITIVE — AB

## 2022-09-06 LAB — URINALYSIS, ROUTINE W REFLEX MICROSCOPIC
Bacteria, UA: NONE SEEN
Bilirubin Urine: NEGATIVE
Glucose, UA: NEGATIVE mg/dL
Ketones, ur: 5 mg/dL — AB
Leukocytes,Ua: NEGATIVE
Nitrite: NEGATIVE
Protein, ur: NEGATIVE mg/dL
Specific Gravity, Urine: 1.019 (ref 1.005–1.030)
pH: 5 (ref 5.0–8.0)

## 2022-09-06 LAB — PATHOLOGIST SMEAR REVIEW
Path Review: INCREASED
Path Review: INCREASED

## 2022-09-06 LAB — CD4/CD8 (T-HELPER/T-SUPPRESSOR CELL)
CD4 absolute: 205 /uL — ABNORMAL LOW (ref 400–1790)
CD4%: 13.1 % — ABNORMAL LOW (ref 33–65)
CD8 T Cell Abs: 1137 /uL — ABNORMAL HIGH (ref 190–1000)
CD8tox: 72.79 % — ABNORMAL HIGH (ref 12–40)
Ratio: 0.18 — ABNORMAL LOW (ref 1.0–3.0)
Total lymphocyte count: 1563 /uL (ref 1000–4000)

## 2022-09-06 LAB — GLUCOSE, CAPILLARY
Glucose-Capillary: 144 mg/dL — ABNORMAL HIGH (ref 70–99)
Glucose-Capillary: 166 mg/dL — ABNORMAL HIGH (ref 70–99)

## 2022-09-06 LAB — CBC
HCT: 27.7 % — ABNORMAL LOW (ref 39.0–52.0)
Hemoglobin: 9.6 g/dL — ABNORMAL LOW (ref 13.0–17.0)
MCH: 31 pg (ref 26.0–34.0)
MCHC: 34.7 g/dL (ref 30.0–36.0)
MCV: 89.4 fL (ref 80.0–100.0)
Platelets: 137 10*3/uL — ABNORMAL LOW (ref 150–400)
RBC: 3.1 MIL/uL — ABNORMAL LOW (ref 4.22–5.81)
RDW: 14.6 % (ref 11.5–15.5)
WBC: 14.4 10*3/uL — ABNORMAL HIGH (ref 4.0–10.5)
nRBC: 0 % (ref 0.0–0.2)

## 2022-09-06 LAB — TRIGLYCERIDES: Triglycerides: 153 mg/dL — ABNORMAL HIGH (ref <150)

## 2022-09-06 LAB — PHOSPHORUS
Phosphorus: 3.3 mg/dL (ref 2.5–4.6)
Phosphorus: 2.7 mg/dL (ref 2.5–4.6)

## 2022-09-06 LAB — HSV 1/2 PCR, CSF
HSV-1 DNA: NEGATIVE
HSV-2 DNA: NEGATIVE

## 2022-09-06 LAB — MAGNESIUM
Magnesium: 2.1 mg/dL (ref 1.7–2.4)
Magnesium: 2.1 mg/dL (ref 1.7–2.4)

## 2022-09-06 LAB — T-HELPER CELLS (CD4) COUNT (NOT AT ARMC)
CD4 % Helper T Cell: 13 % — ABNORMAL LOW (ref 33–65)
CD4 T Cell Abs: 216 /uL — ABNORMAL LOW (ref 400–1790)

## 2022-09-06 LAB — CSF CULTURE W GRAM STAIN

## 2022-09-06 MED ORDER — SODIUM CHLORIDE 0.9 % IV BOLUS
1000.0000 mL | Freq: Once | INTRAVENOUS | Status: AC
  Administered 2022-09-06: 1000 mL via INTRAVENOUS

## 2022-09-06 MED ORDER — EMTRICITABINE-TENOFOVIR AF 200-25 MG PO TABS
1.0000 | ORAL_TABLET | Freq: Every day | ORAL | Status: DC
  Filled 2022-09-06 (×2): qty 1

## 2022-09-06 MED ORDER — ENOXAPARIN SODIUM 40 MG/0.4ML IJ SOSY
40.0000 mg | PREFILLED_SYRINGE | INTRAMUSCULAR | Status: DC
  Administered 2022-09-06 – 2022-09-13 (×8): 40 mg via SUBCUTANEOUS
  Filled 2022-09-06 (×8): qty 0.4

## 2022-09-06 MED ORDER — ASPIRIN 81 MG PO CHEW
81.0000 mg | CHEWABLE_TABLET | Freq: Every day | ORAL | Status: DC
  Filled 2022-09-06: qty 1

## 2022-09-06 MED ORDER — CHLORHEXIDINE GLUCONATE CLOTH 2 % EX PADS
6.0000 | MEDICATED_PAD | Freq: Every day | CUTANEOUS | Status: DC
  Administered 2022-09-06 – 2022-10-17 (×40): 6 via TOPICAL

## 2022-09-06 MED ORDER — OSMOLITE 1.5 CAL PO LIQD
1000.0000 mL | ORAL | Status: DC
  Administered 2022-09-06 – 2022-09-07 (×2): 1000 mL
  Filled 2022-09-06: qty 1000

## 2022-09-06 MED ORDER — ASPIRIN 300 MG RE SUPP
300.0000 mg | Freq: Every day | RECTAL | Status: DC
  Filled 2022-09-06: qty 1

## 2022-09-06 MED ORDER — EMTRICITABINE-TENOFOVIR AF 200-25 MG PO TABS
1.0000 | ORAL_TABLET | Freq: Every day | ORAL | Status: DC
  Administered 2022-09-06 – 2022-09-09 (×4): 1
  Filled 2022-09-06 (×4): qty 1

## 2022-09-06 MED ORDER — VANCOMYCIN HCL 750 MG/150ML IV SOLN
750.0000 mg | Freq: Two times a day (BID) | INTRAVENOUS | Status: DC
  Administered 2022-09-06 – 2022-09-08 (×4): 750 mg via INTRAVENOUS
  Filled 2022-09-06 (×4): qty 150

## 2022-09-06 MED ORDER — ASPIRIN 300 MG RE SUPP: 300.0000 mg | Freq: Every day | RECTAL | Status: DC

## 2022-09-06 MED ORDER — GADOBUTROL 1 MMOL/ML IV SOLN
5.0000 mL | Freq: Once | INTRAVENOUS | Status: AC | PRN
  Administered 2022-09-06: 5 mL via INTRAVENOUS

## 2022-09-06 MED ORDER — THIAMINE MONONITRATE 100 MG PO TABS
100.0000 mg | ORAL_TABLET | Freq: Every day | ORAL | Status: DC
  Administered 2022-09-06 – 2022-09-07 (×2): 100 mg
  Filled 2022-09-06 (×3): qty 1

## 2022-09-06 MED ORDER — CLOPIDOGREL BISULFATE 75 MG PO TABS
75.0000 mg | ORAL_TABLET | Freq: Every day | ORAL | Status: DC
  Administered 2022-09-06 – 2022-09-07 (×2): 75 mg
  Filled 2022-09-06 (×3): qty 1

## 2022-09-06 MED ORDER — ASPIRIN 81 MG PO CHEW: 81.0000 mg | CHEWABLE_TABLET | Freq: Every day | ORAL | Status: DC

## 2022-09-06 MED ORDER — NOREPINEPHRINE 4 MG/250ML-% IV SOLN
2.0000 ug/min | INTRAVENOUS | Status: DC
  Administered 2022-09-06: 2 ug/min via INTRAVENOUS
  Administered 2022-09-07: 10 ug/min via INTRAVENOUS
  Filled 2022-09-06 (×3): qty 250

## 2022-09-06 MED ORDER — PROSOURCE TF20 ENFIT COMPATIBL EN LIQD
60.0000 mL | Freq: Every day | ENTERAL | Status: DC
  Administered 2022-09-06 – 2022-09-07 (×2): 60 mL
  Filled 2022-09-06 (×3): qty 60

## 2022-09-06 MED ORDER — NOREPINEPHRINE 4 MG/250ML-% IV SOLN: 0.0000 ug/min | INTRAVENOUS | Status: DC

## 2022-09-06 MED ORDER — POTASSIUM CHLORIDE 20 MEQ PO PACK
40.0000 meq | PACK | Freq: Once | ORAL | Status: AC
  Administered 2022-09-06: 40 meq
  Filled 2022-09-06: qty 2

## 2022-09-06 MED ORDER — DOLUTEGRAVIR SODIUM 50 MG PO TABS
50.0000 mg | ORAL_TABLET | Freq: Every day | ORAL | Status: DC
  Filled 2022-09-06 (×2): qty 1

## 2022-09-06 MED ORDER — SODIUM CHLORIDE 0.9 % IV SOLN
250.0000 mL | INTRAVENOUS | Status: DC
  Administered 2022-09-06 – 2022-10-01 (×2): 250 mL via INTRAVENOUS

## 2022-09-06 MED ORDER — INSULIN ASPART 100 UNIT/ML IJ SOLN
0.0000 [IU] | INTRAMUSCULAR | Status: DC
  Administered 2022-09-06: 3 [IU] via SUBCUTANEOUS
  Administered 2022-09-06: 2 [IU] via SUBCUTANEOUS
  Administered 2022-09-07: 3 [IU] via SUBCUTANEOUS
  Administered 2022-09-07 (×3): 2 [IU] via SUBCUTANEOUS

## 2022-09-06 MED ORDER — DOLUTEGRAVIR SODIUM 50 MG PO TABS
50.0000 mg | ORAL_TABLET | Freq: Every day | ORAL | Status: DC
  Administered 2022-09-06 – 2022-09-07 (×2): 50 mg
  Filled 2022-09-06 (×3): qty 1

## 2022-09-06 MED ORDER — ASPIRIN 81 MG PO CHEW
81.0000 mg | CHEWABLE_TABLET | Freq: Every day | ORAL | Status: DC
  Administered 2022-09-06 – 2022-09-07 (×2): 81 mg
  Filled 2022-09-06 (×2): qty 1

## 2022-09-06 NOTE — Progress Notes (Signed)
eLink Physician-Brief Progress Note Patient Name: Christopher Farrell DOB: 02/24/1961 MRN: [id number]   Date of Service  09/06/2022  HPI/Events of Note  Patient with intermittent bladder catheterization x 3 with recurrent urinary retention.  eICU Interventions  Foley catheter ordered.        Thomasene Lot Kimberli Winne 09/06/2022, 10:08 PM

## 2022-09-06 NOTE — Procedures (Addendum)
Patient Name: YANZIEL BRAUNSTEIN  MRN: [id number]  Epilepsy Attending: Charlsie Quest  Referring Physician/Provider: [name], MD  Duration: 09/05/2022 2036 to 09/06/2022 2036  Patient history: 62yo M with seizure getting eeg to evaluate for seizure.  Level of alertness: comatose  AEDs during EEG study: Propofol  Technical aspects: This EEG study was done with scalp electrodes positioned according to the 10-20 International system of electrode placement. Electrical activity was reviewed with band pass filter of 1-70Hz , sensitivity of 7 uV/mm, display speed of 65mm/sec with a 60Hz  notched filter applied as appropriate. EEG data were recorded continuously and digitally stored.  Video monitoring was available and reviewed as appropriate.  Description: EEG showed continuous generalized 3 to 7 Hz theta-delta slowing admixed with intermittent generalized 13-15hz  beta activity. Hyperventilation and photic stimulation were not performed.     ABNORMALITY - Continuous slow, generalized  IMPRESSION: This study is suggestive of severe diffuse encephalopathy, nonspecific etiology but likely related to sedation. No seizures or epileptiform discharges were seen throughout the recording.  Mya Suell Annabelle Harman

## 2022-09-06 NOTE — Progress Notes (Signed)
LTM maint complete - no skin breakdown . Patient went to MRI and returned leads reconnected all leads attached. Head box not reconnecting, tech switched out Head box, Head box operating fine. Study is running , now computer not reconnecting to server.  [facility] is not monitoring study due to connection not available.

## 2022-09-06 NOTE — Progress Notes (Signed)
Echocardiogram 2D Echocardiogram has been performed.  Lucendia Herrlich 09/06/2022, 12:36 PM

## 2022-09-06 NOTE — Progress Notes (Signed)
[facility] ADULT ICU REPLACEMENT PROTOCOL   The patient does apply for the [facility] Adult ICU Electrolyte Replacment Protocol based on the criteria listed below:   1.Exclusion criteria: TCTS, ECMO, Dialysis, and Myasthenia Gravis patients 2. Is GFR >/= 30 ml/min? Yes.    Patient's GFR today is >60 3. Is SCr </= 2? Yes.   Patient's SCr is 0.63 mg/dL 4. Did SCr increase >/= 0.5 in 24 hours? No. 5.Pt's weight >40kg  Yes.   6. Abnormal electrolyte(s): Potassium 3.4  7. Electrolytes replaced per protocol 8.  Call MD STAT for K+ </= 2.5, Phos </= 1, or Mag </= 1 Physician:  Dr. Vladimir Faster  Rane Dumm A Haidy Kackley 09/06/2022 6:23 AM

## 2022-09-06 NOTE — Progress Notes (Signed)
LTM maint complete - no skin breakdown under: F3, C3, P4, O1.

## 2022-09-06 NOTE — Progress Notes (Addendum)
PHARMACY - PHYSICIAN COMMUNICATION CRITICAL VALUE ALERT - BLOOD CULTURE IDENTIFICATION (BCID)  Christopher Farrell is an 62 y.o. male who presented to [facility] on 09/05/2022 with a chief complaint of meningitis rule out   Assessment:  1/8 strep pneumo likely meningitis given gram positive cocci growing in CSF   Name of physician (or Provider) Contacted: Gaetana Michaelis, MD   Current antibiotics: Vancomycin , ceftriaxone 2g q12h, ampicillin, acyclovir   Changes to prescribed antibiotics recommended:  Patient is on recommended antibiotics - No changes needed Consider de-escalation in AM   Results for orders placed or performed during the hospital encounter of 09/05/22  Blood Culture ID Panel (Reflexed) (Collected: 09/05/2022 11:13 AM)  Result Value Ref Range   Enterococcus faecalis NOT DETECTED NOT DETECTED   Enterococcus Faecium NOT DETECTED NOT DETECTED   Listeria monocytogenes NOT DETECTED NOT DETECTED   Staphylococcus species NOT DETECTED NOT DETECTED   Staphylococcus aureus (BCID) NOT DETECTED NOT DETECTED   Staphylococcus epidermidis NOT DETECTED NOT DETECTED   Staphylococcus lugdunensis NOT DETECTED NOT DETECTED   Streptococcus species DETECTED (A) NOT DETECTED   Streptococcus agalactiae NOT DETECTED NOT DETECTED   Streptococcus pneumoniae DETECTED (A) NOT DETECTED   Streptococcus pyogenes NOT DETECTED NOT DETECTED   A.calcoaceticus-baumannii NOT DETECTED NOT DETECTED   Bacteroides fragilis NOT DETECTED NOT DETECTED   Enterobacterales NOT DETECTED NOT DETECTED   Enterobacter cloacae complex NOT DETECTED NOT DETECTED   Escherichia coli NOT DETECTED NOT DETECTED   Klebsiella aerogenes NOT DETECTED NOT DETECTED   Klebsiella oxytoca NOT DETECTED NOT DETECTED   Klebsiella pneumoniae NOT DETECTED NOT DETECTED   Proteus species NOT DETECTED NOT DETECTED   Salmonella species NOT DETECTED NOT DETECTED   Serratia marcescens NOT DETECTED NOT DETECTED   Haemophilus influenzae NOT  DETECTED NOT DETECTED   Neisseria meningitidis NOT DETECTED NOT DETECTED   Pseudomonas aeruginosa NOT DETECTED NOT DETECTED   Stenotrophomonas maltophilia NOT DETECTED NOT DETECTED   Candida albicans NOT DETECTED NOT DETECTED   Candida auris NOT DETECTED NOT DETECTED   Candida glabrata NOT DETECTED NOT DETECTED   Candida krusei NOT DETECTED NOT DETECTED   Candida parapsilosis NOT DETECTED NOT DETECTED   Candida tropicalis NOT DETECTED NOT DETECTED   Cryptococcus neoformans/gattii NOT DETECTED NOT DETECTED     Estill Batten, PharmD, BCCCP  09/06/2022  12:39 AM

## 2022-09-06 NOTE — Progress Notes (Addendum)
Neurology Progress Note  Brief HPI: Christopher Farrell is a 62 year old male pt presenting to [place] ED today 09/05/2022 with altered mental status secondary to sepsis. Patient was seen in the ED on 06/26 with generalized body aches and was found to have multifocal pneumonia, was discharged home on Augmentin and Azithromycin and oral nystatin for oral candida. Patient has a pertinent history of HIV, not on anti-retrovirals despite symptomatic improvement.   Subjective: Patient seen in room with mother at the bedside. He is still on sedation and LTM. Opens eyes and follows commands after he is turned, but was not following commands prior. Still on propofol, nursing is titrating it down  Exam: Vitals:   09/06/22 1000 09/06/22 1128  BP: 96/75   Pulse: 67 68  Resp: 18 18  Temp:    SpO2: 100% 99%   Gen: In bed, NAD Resp: mechanically ventilated Abd: soft, nt  Neuro: Mental Status: Does not initially respond to painful stimuli, but when turned his eyes opened and he began following commands. Responds more consistently to stimuli on the right side and follows simple commands in a delayed fashion.  Cranial Nerves: Pupils 2mm sluggish. Dysconjugate with forced eye opening. Correct when he opens eyes independently  Head is midline Cough and gag intact Motor/Sensory: Withdraws in all extremities to pain Elevates bilateral upper extremities against gravity L.R Bil lower extremities with effort but unable to maintain antigravity strength  Pertinent Labs: Meningitis/encephalitis panel - streptococcus pneumoniae WBC 1045 RBC 535 Glucose less than 20 Protein greater than 600  CD4 T Cells 216 CD4% Helper T Cells 13  Component     Latest Ref Rng 09/05/2022  Total lymphocyte count     1,000 - 4,000 /uL 1,563   CD4%     33 - 65 % 13.10 (L)   CD4 absolute     400 - 1,790 /uL 205 (L)   CD8tox     12 - 40 % 72.79 (H)   CD8 T Cell Abs     190 - 1,000 /uL 1,137 (H)   Ratio     1.0 -  3.0  0.18 (L)       Imaging Reviewed: MRI Brain - Multifocal acute/subacute ischemia within the right greater than left cerebral hemispheres and left cerebellum. The largest lesions are located in the right parietal and occipital lobes. No hemorrhage or mass effect.  LTM EEG 09/05/2022 2036 to 09/06/2022 1140: This study is suggestive of severe diffuse encephalopathy, nonspecific etiology but likely related to sedation. No seizures or epileptiform discharges were seen throughout the recording.   CSF and blood cultures both (+) for strep pneumo  Impression:  89M HIV + and noncompliant with HAART initially presented  obtunded, GTC seizure, leukocytosis and recent mulitfocal pneumonia and now intubated. CSF and blood cultures both (+) for strep pneumo. LTM negative for seizure activity, but he is sedated with propofol. MRI brain shows multifocal acute ischemia, c/f septic emboli given vegetation seen on TTE.  Recommendations: - Continue to lower sedation  - Continue abx per ID (ceftriaxone, vanc) - Continue LTM while weaning sedation - Permissive HTN x48 hrs from sx onset goal BP <220/110. PRN labetalol or hydralazine if BP above these parameters. Avoid oral antihypertensives. Currently on norepi - CTA H&N - TEE pending - Check A1c and LDL + add statin per guidelines - ASA 81mg  daily + plavix 75mg  daily x21 days f/b ASA 81mg  daily monotherapy after that - q4 hr neuro checks -  STAT head CT for any change in neuro exam - Tele - PT/OT/SLP when able to participate - Stroke education - Amb referral to neurology upon discharge   Will continue to follow  Patient seen and examined by NP/APP with MD. MD to update note as needed.   Elmer Picker, DNP, FNP-BC [facility] Pager: [phone]   Attending Neurohospitalist Addendum Patient seen and examined with APP/Resident. Agree with the history and physical as documented above. Agree with the plan as documented, which I helped  formulate. I have edited the note above to reflect my full findings and recommendations. I have independently reviewed the chart, obtained history, review of systems and examined the patient.I have personally reviewed pertinent head/neck/spine imaging (CT/MRI). Please feel free to call with any questions.  This patient is critically ill and at significant risk of neurological worsening, death and care requires constant monitoring of vital signs, hemodynamics,respiratory and cardiac monitoring, neurological assessment, discussion with family, other specialists and medical decision making of high complexity. I spent 40 minutes of neurocritical care time  in the care of  this patient. This was time spent independent of any time provided by nurse practitioner or PA.  Bing Neighbors, MD [facility] [phone]  If 7pm- 7am, please page neurology on call as listed in AMION.

## 2022-09-06 NOTE — Progress Notes (Signed)
eLink Physician-Brief Progress Note Patient Name: Christopher Farrell DOB: [birth year] MRN: [id number]   Date of Service  09/06/2022  HPI/Events of Note  Notified 1/8 blood culture bottles growing S. pneumo.  CSF studies consistent with meningitic process.   eICU Interventions  Pt already on empiric antibiotics with meningitic coverage. Will maintain on current therapy.         Tyara Dassow M DELA CRUZ 09/06/2022, 12:43 AM
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
## 2022-09-06 NOTE — Progress Notes (Addendum)
Initial Nutrition Assessment  DOCUMENTATION CODES:   Severe malnutrition in context of chronic illness  INTERVENTION:  Initiate tube feeding via OGT: Osmolite 1.5 at 50 ml/h (1200 ml per day) Start at 24mL/h and advance by 10mL q12h to goal Prosource TF20 60 ml 1x/d Provides 1880 kcal, 95 gm protein, 914 ml free water daily  Exchange OGT for cortrak tube 7/3  Monitor magnesium and phosphorus every 12 hours x 4 occurrences, MD to replete as needed, as pt is at risk for refeeding syndrome given severe malnutrition.  Add thiamine 100mg  x 7 days for risk of refeeding  Banatrol BID for loose stools present on admission  NUTRITION DIAGNOSIS:   Severe Malnutrition related to chronic illness (HIV) as evidenced by severe fat depletion, severe muscle depletion.  GOAL:   Patient will meet greater than or equal to 90% of their needs  MONITOR:   TF tolerance, I & O's, Vent status, Labs, Weight trends  REASON FOR ASSESSMENT:   Ventilator    ASSESSMENT:   Pt with hx of HIV (not compliant with antivirals) and EtOH/tobacco abuse presented to ED with AMS after recently being diagnosed with pneumonia. Found to be septic and then suffered a seizure in ED.  7/1 - intubated  Patient is currently intubated on ventilator support. Continuous EEG in process.   Pt resting in bed at the time of assessment. No family is present to provide a nutrition hx at this time. On exam, pt is very thin with severe loss of muscle and fat stores. Reviewed weight hx and unsure if accurate, but based on records pt has ~20% weight loss in the last month.    OGT in place with tip terminating in the gastric body. Discussed with MD, ok to initiate nutrition. Pt at risk for refeeding syndrome given weight loss and severe malnutrition with questionable intake hx. Will initiate TF slowly and monitor magnesium and phosphorus every 12 hours x 4 occurrences, MD to replete as needed.  Discussed plan with RN.  MV: 9.8  L/min Temp (24hrs), Avg:99.2 F (37.3 C), Min:97.6 F (36.4 C), Max:101.8 F (38.8 C)  Propofol: 12.12 ml/hr (320kcal/d) - rate stable   Intake/Output Summary (Last 24 hours) at 09/06/2022 1150 Last data filed at 09/06/2022 1036 Gross per 24 hour  Intake 6100.87 ml  Output --  Net 6100.87 ml  Net IO Since Admission: 6,100.87 mL [09/06/22 1150]  Nutritionally Relevant Medications: Scheduled Meds:  docusate  100 mg BID   famotidine  20 mg BID   polyethylene glycol  17 g Daily   Continuous Infusions:  cefTRIAXone (ROCEPHIN)  IV Stopped (09/06/22 0843)   fentaNYL infusion INTRAVENOUS 50 mcg/hr (09/06/22 1036)   norepinephrine (LEVOPHED) Adult infusion     propofol (DIPRIVAN) infusion 30 mcg/kg/min (09/06/22 1036)   PRN Meds: docusate sodium, ondansetron (ZOFRAN) IV, polyethylene glycol  Labs Reviewed: Na 134 K 3.4  NUTRITION - FOCUSED PHYSICAL EXAM:  Flowsheet Row Most Recent Value  Orbital Region Unable to assess  [forehead obsured by equipment]  Upper Arm Region Severe depletion  Thoracic and Lumbar Region Severe depletion  Buccal Region Unable to assess  [ETT holder blocking view]  Temple Region Unable to assess  [forehead obsecured by equipement]  Clavicle Bone Region Moderate depletion  Clavicle and Acromion Bone Region Severe depletion  Scapular Bone Region Severe depletion  Dorsal Hand Moderate depletion  Patellar Region Severe depletion  Anterior Thigh Region Severe depletion  Posterior Calf Region Severe depletion  Edema (RD Assessment) None  Hair Reviewed  Eyes Unable to assess  Mouth Reviewed  Skin Reviewed  Nails Reviewed    Diet Order:   Diet Order             Diet NPO time specified  Diet effective now                   EDUCATION NEEDS:   Not appropriate for education at this time  Skin:  Skin Assessment: Reviewed RN Assessment  Last BM:  7/1 Fecal management system placed 7/1 - diarrhea present on admission  Height:   Ht  Readings from Last 1 Encounters:  09/02/22 5\' 4"  (1.626 m)    Weight:   Wt Readings from Last 1 Encounters:  09/06/22 52.2 kg    Ideal Body Weight:  59.1 kg  BMI:  Body mass index is 19.75 kg/m.  Estimated Nutritional Needs:   Kcal:  1700-2000 kcal/d  Protein:  85-100 g/d  Fluid:  >/=1.8L/d    Greig Castilla, RD, LDN Clinical Dietitian RD pager # available in [facility]  After hours/weekend pager # available in [facility]
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
## 2022-09-07 ENCOUNTER — Inpatient Hospital Stay (HOSPITAL_COMMUNITY): Payer: Medicaid Other

## 2022-09-07 DIAGNOSIS — J189 Pneumonia, unspecified organism: Secondary | ICD-10-CM | POA: Insufficient documentation

## 2022-09-07 DIAGNOSIS — I639 Cerebral infarction, unspecified: Secondary | ICD-10-CM | POA: Insufficient documentation

## 2022-09-07 DIAGNOSIS — A419 Sepsis, unspecified organism: Secondary | ICD-10-CM | POA: Insufficient documentation

## 2022-09-07 DIAGNOSIS — I38 Endocarditis, valve unspecified: Secondary | ICD-10-CM

## 2022-09-07 LAB — BASIC METABOLIC PANEL
Anion gap: 7 (ref 5–15)
Anion gap: 8 (ref 5–15)
BUN: 14 mg/dL (ref 8–23)
BUN: 10 mg/dL (ref 8–23)
CO2: 19 mmol/L — ABNORMAL LOW (ref 22–32)
CO2: 23 mmol/L (ref 22–32)
Calcium: 7.5 mg/dL — ABNORMAL LOW (ref 8.9–10.3)
Calcium: 7.7 mg/dL — ABNORMAL LOW (ref 8.9–10.3)
Chloride: 112 mmol/L — ABNORMAL HIGH (ref 98–111)
Chloride: 107 mmol/L (ref 98–111)
Creatinine, Ser: 0.65 mg/dL (ref 0.61–1.24)
Creatinine, Ser: 0.59 mg/dL — ABNORMAL LOW (ref 0.61–1.24)
GFR, Estimated: >60 mL/min (ref >60)
GFR, Estimated: >60 mL/min (ref >60)
Glucose, Bld: 100 mg/dL — ABNORMAL HIGH (ref 70–99)
Glucose, Bld: 122 mg/dL — ABNORMAL HIGH (ref 70–99)
Potassium: 6.1 mmol/L — ABNORMAL HIGH (ref 3.5–5.1)
Potassium: 4 mmol/L (ref 3.5–5.1)
Sodium: 138 mmol/L (ref 135–145)
Sodium: 138 mmol/L (ref 135–145)

## 2022-09-07 LAB — CULTURE, BLOOD (ROUTINE X 2)

## 2022-09-07 LAB — LIPID PANEL
Cholesterol: 58 mg/dL (ref 0–200)
HDL: <10 mg/dL — ABNORMAL LOW (ref >40)
Triglycerides: 122 mg/dL (ref <150)
VLDL: 24 mg/dL (ref 0–40)

## 2022-09-07 LAB — PHOSPHORUS
Phosphorus: 2.9 mg/dL (ref 2.5–4.6)
Phosphorus: 1.8 mg/dL — ABNORMAL LOW (ref 2.5–4.6)

## 2022-09-07 LAB — GLUCOSE, CAPILLARY
Glucose-Capillary: 101 mg/dL — ABNORMAL HIGH (ref 70–99)
Glucose-Capillary: 117 mg/dL — ABNORMAL HIGH (ref 70–99)
Glucose-Capillary: 124 mg/dL — ABNORMAL HIGH (ref 70–99)
Glucose-Capillary: 133 mg/dL — ABNORMAL HIGH (ref 70–99)
Glucose-Capillary: 137 mg/dL — ABNORMAL HIGH (ref 70–99)
Glucose-Capillary: 172 mg/dL — ABNORMAL HIGH (ref 70–99)

## 2022-09-07 LAB — HIV-1/2 AB - DIFFERENTIATION
HIV 1 Ab: REACTIVE
HIV 2 Ab: NONREACTIVE

## 2022-09-07 LAB — STREP PNEUMONIAE URINARY ANTIGEN: Strep Pneumo Urinary Antigen: POSITIVE — AB

## 2022-09-07 LAB — MAGNESIUM
Magnesium: 2.4 mg/dL (ref 1.7–2.4)
Magnesium: 2.2 mg/dL (ref 1.7–2.4)

## 2022-09-07 MED ORDER — IOHEXOL 350 MG/ML SOLN
75.0000 mL | Freq: Once | INTRAVENOUS | Status: AC | PRN
  Administered 2022-09-07: 75 mL via INTRAVENOUS

## 2022-09-07 MED ORDER — SODIUM ZIRCONIUM CYCLOSILICATE 10 G PO PACK
10.0000 g | PACK | Freq: Once | ORAL | Status: AC
  Administered 2022-09-07: 10 g
  Filled 2022-09-07: qty 1

## 2022-09-07 MED ORDER — ORAL CARE MOUTH RINSE
15.0000 mL | OROMUCOSAL | Status: DC
  Administered 2022-09-07 – 2022-09-09 (×6): 15 mL via OROMUCOSAL

## 2022-09-07 NOTE — Procedures (Signed)
Cortrak  Person Inserting Tube:  Abiola Behring C, RD Tube Type:  Cortrak - 43 inches Tube Size:  10 Tube Location:  Left nare Secured by: Bridle Technique Used to Measure Tube Placement:  Marking at nare/corner of mouth Cortrak Secured At:  65 cm   Cortrak Tube Team Note:  Consult received to place a Cortrak feeding tube.   X-ray is required, abdominal x-ray has been ordered by the Cortrak team. Please confirm tube placement before using the Cortrak tube.   If the tube becomes dislodged please keep the tube and contact the Cortrak team at [email or web address] for replacement.  If after hours and replacement cannot be delayed, place a NG tube and confirm placement with an abdominal x-ray.    Yesmin Mutch P., RD, LDN, CNSC See AMiON for contact information    

## 2022-09-07 NOTE — Progress Notes (Signed)
LTM maint complete - no skin breakdown seen  Service equipment, P4 O1 F3 [facility] monitored, Event button test confirmed by [facility].

## 2022-09-07 NOTE — Progress Notes (Signed)
Transported patient to C.T while patient was on the ventilator. Patent remained stable during transport.

## 2022-09-07 NOTE — Progress Notes (Signed)
LTM maint complete - no skin breakdown under:F7, CZ,

## 2022-09-07 NOTE — Procedures (Addendum)
Patient Name: Christopher Farrell  MRN: [id number]  Epilepsy Attending: Charlsie Quest  Referring Physician/Provider: [name], MD  Duration: 09/06/2022 2036 to 09/07/2022 1255   Patient history: 61yo M with seizure getting eeg to evaluate for seizure.   Level of alertness: comatose   AEDs during EEG study: Propofol   Technical aspects: This EEG study was done with scalp electrodes positioned according to the 10-20 International system of electrode placement. Electrical activity was reviewed with band pass filter of 1-70Hz , sensitivity of 7 uV/mm, display speed of 79mm/sec with a 60Hz  notched filter applied as appropriate. EEG data were recorded continuously and digitally stored.  Video monitoring was available and reviewed as appropriate.   Description: EEG showed continuous generalized 3 to 7 Hz theta-delta slowing admixed with intermittent generalized 13-15hz  beta activity. Hyperventilation and photic stimulation were not performed.      ABNORMALITY - Continuous slow, generalized   IMPRESSION: This study is suggestive of severe diffuse encephalopathy, nonspecific etiology but likely related to sedation. No seizures or epileptiform discharges were seen throughout the recording.   [name]

## 2022-09-07 NOTE — Progress Notes (Signed)
Patient removed Cortrak. Bridle cut and Cortrak discarded. eLink RN notified of findings as well as lung exam, which remains unchanged from earlier assessment.

## 2022-09-07 NOTE — Procedures (Signed)
Extubation Procedure Note  Patient Details:   Name: Christopher Farrell DOB: December 17, 1960 MRN: [id number]   Airway Documentation:    Vent end date: 09/07/22 Vent end time: 1230   Evaluation  O2 sats: stable throughout Complications: No apparent complications Patient did tolerate procedure well. Bilateral Breath Sounds: Clear  Patient extubated per CCM at bedside. Currenty on 8/5 wean. Strong Productive cough. Cuff leak noted. Patient able to vocalize name post extubation. Placed on 4L [place].   Clent Ridges 09/07/2022, 12:35 PM

## 2022-09-07 NOTE — Progress Notes (Signed)
Brief Nutrition Support Note  Pt remains ventilated at this time. OGT was exchanged for cortrak this AM. XR read and tube terminates in the mid to distal stomach. Relayed to RN that tube was good to use for enteral feeds and medications.   Per flowsheet, TF had been infusing at 61mL/h prior to cortrak placement. It does not appear that advancement instructions were utilized. Reviewed labs, no significant drops in K, phosphorus, or Mg this AM. Will continue at goal rate and continue to monitor.   Greig Castilla, RD, LDN Clinical Dietitian RD pager # available in AMION  After hours/weekend pager # available in [facility]

## 2022-09-07 NOTE — Progress Notes (Signed)
[facility]   Reason for visit: Follow up on meningitis  Interval History: sensitivities pending, remains intubated, family at bedside; WBC 14.4, fever, 100 over the last 24 hours.    Physical Exam: Constitutional:  Vitals:   09/07/22 1215 09/07/22 1233  BP: 100/77   Pulse:  86  Resp: (!) 32 (!) 23  Temp:    SpO2:  94%   patient appears in NAD Eyes: anicteric HENT: +ET Respiratory: respiratory effort on vent   Review of Systems: Unable to be assessed due to patient factors  Lab Results  Component Value Date   WBC 14.4 (H) 09/06/2022   HGB 9.6 (L) 09/06/2022   HCT 27.7 (L) 09/06/2022   MCV 89.4 09/06/2022   PLT 137 (L) 09/06/2022    Lab Results  Component Value Date   CREATININE 0.65 09/07/2022   BUN 14 09/07/2022   NA 138 09/07/2022   K 6.1 (H) 09/07/2022   CL 112 (H) 09/07/2022   CO2 19 (L) 09/07/2022    Lab Results  Component Value Date   ALT 36 09/05/2022   AST 94 (H) 09/05/2022   ALKPHOS 42 09/05/2022     Microbiology: Recent Results (from the past 240 hour(s))  Group A Strep by PCR     Status: None   Collection Time: 08/31/22  1:01 PM   Specimen: Throat; Sterile Swab  Result Value Ref Range Status   Group A Strep by PCR NOT DETECTED NOT DETECTED Final    Comment: Performed at [facility], [address], [place], [place] [address]  Resp panel by RT-PCR (RSV, Flu A&B, Covid) Throat     Status: None   Collection Time: 08/31/22  1:01 PM   Specimen: Throat; Nasal Swab  Result Value Ref Range Status   SARS Coronavirus 2 by RT PCR NEGATIVE NEGATIVE Final   Influenza A by PCR NEGATIVE NEGATIVE Final   Influenza B by PCR NEGATIVE NEGATIVE Final    Comment: (NOTE) The Xpert Xpress SARS-CoV-2/FLU/RSV plus assay is intended as an aid in the diagnosis of influenza from Nasopharyngeal swab specimens and should not be used as a sole basis for treatment. Nasal washings and aspirates are unacceptable for Xpert Xpress  SARS-CoV-2/FLU/RSV testing.  Fact Sheet for Patients: BloggerCourse.com  Fact Sheet for Healthcare Providers: SeriousBroker.it  This test is not yet approved or cleared by the [place] FDA and has been authorized for detection and/or diagnosis of SARS-CoV-2 by FDA under an Emergency Use Authorization (EUA). This EUA will remain in effect (meaning this test can be used) for the duration of the COVID-19 declaration under Section 564(b)(1) of the Act, 21 U.S.C. section 360bbb-3(b)(1), unless the authorization is terminated or revoked.     Resp Syncytial Virus by PCR NEGATIVE NEGATIVE Final    Comment: (NOTE) Fact Sheet for Patients: BloggerCourse.com  Fact Sheet for Healthcare Providers: SeriousBroker.it  This test is not yet approved or cleared by the [place] FDA and has been authorized for detection and/or diagnosis of SARS-CoV-2 by FDA under an Emergency Use Authorization (EUA). This EUA will remain in effect (meaning this test can be used) for the duration of the COVID-19 declaration under Section 564(b)(1) of the Act, 21 U.S.C. section 360bbb-3(b)(1), unless the authorization is terminated or revoked.  Performed at [facility], [address], [place], [place] [address]   Resp panel by RT-PCR (RSV, Flu A&B, Covid) Anterior Nasal Swab     Status: None  Collection Time: 09/05/22 11:13 AM   Specimen: Anterior Nasal Swab  Result Value Ref Range Status   SARS Coronavirus 2 by RT PCR NEGATIVE NEGATIVE Final   Influenza A by PCR NEGATIVE NEGATIVE Final   Influenza B by PCR NEGATIVE NEGATIVE Final    Comment: (NOTE) The Xpert Xpress SARS-CoV-2/FLU/RSV plus assay is intended as an aid in the diagnosis of influenza from Nasopharyngeal swab specimens and should not be used as a sole basis for treatment. Nasal washings and aspirates are unacceptable for Xpert  Xpress SARS-CoV-2/FLU/RSV testing.  Fact Sheet for Patients: BloggerCourse.com  Fact Sheet for Healthcare Providers: SeriousBroker.it  This test is not yet approved or cleared by the [place] FDA and has been authorized for detection and/or diagnosis of SARS-CoV-2 by FDA under an Emergency Use Authorization (EUA). This EUA will remain in effect (meaning this test can be used) for the duration of the COVID-19 declaration under Section 564(b)(1) of the Act, 21 U.S.C. section 360bbb-3(b)(1), unless the authorization is terminated or revoked.     Resp Syncytial Virus by PCR NEGATIVE NEGATIVE Final    Comment: (NOTE) Fact Sheet for Patients: BloggerCourse.com  Fact Sheet for Healthcare Providers: SeriousBroker.it  This test is not yet approved or cleared by the [place] FDA and has been authorized for detection and/or diagnosis of SARS-CoV-2 by FDA under an Emergency Use Authorization (EUA). This EUA will remain in effect (meaning this test can be used) for the duration of the COVID-19 declaration under Section 564(b)(1) of the Act, 21 U.S.C. section 360bbb-3(b)(1), unless the authorization is terminated or revoked.  Performed at [facility], [address], [place], [place] [address]   Blood Culture (routine x 2)     Status: Abnormal (Preliminary result)   Collection Time: 09/05/22 11:13 AM   Specimen: BLOOD RIGHT ARM  Result Value Ref Range Status   Specimen Description BLOOD RIGHT ARM  Final   Special Requests   Final    BOTTLES DRAWN AEROBIC AND ANAEROBIC Blood Culture results may not be optimal due to an excessive volume of blood received in culture bottles   Culture  Setup Time   Final    GRAM POSITIVE COCCI IN BOTH AEROBIC AND ANAEROBIC BOTTLES CRITICAL RESULT CALLED TO, READ BACK BY AND VERIFIED WITH: PHARMD H. WILSON 09/06/22 @ 0036 BY AB    Culture  (A)  Final    STREPTOCOCCUS PNEUMONIAE SUSCEPTIBILITIES TO FOLLOW Performed at [facility], [address], [place], [place] [address]    Report Status PENDING  Incomplete  Blood Culture ID Panel (Reflexed)     Status: Abnormal   Collection Time: 09/05/22 11:13 AM  Result Value Ref Range Status   Enterococcus faecalis NOT DETECTED NOT DETECTED Final   Enterococcus Faecium NOT DETECTED NOT DETECTED Final   Listeria monocytogenes NOT DETECTED NOT DETECTED Final   Staphylococcus species NOT DETECTED NOT DETECTED Final   Staphylococcus aureus (BCID) NOT DETECTED NOT DETECTED Final   Staphylococcus epidermidis NOT DETECTED NOT DETECTED Final   Staphylococcus lugdunensis NOT DETECTED NOT DETECTED Final   Streptococcus species DETECTED (A) NOT DETECTED Final    Comment: CRITICAL RESULT CALLED TO, READ BACK BY AND VERIFIED WITH: PHARMD H. WILSON 09/06/22 @ 0036 BY AB    Streptococcus agalactiae NOT DETECTED NOT DETECTED Final   Streptococcus pneumoniae DETECTED (A) NOT DETECTED Final    Comment: CRITICAL RESULT CALLED TO, READ BACK BY AND VERIFIED WITH: PHARMD H. WILSON 09/06/22 @ 0036 BY AB  Streptococcus pyogenes NOT DETECTED NOT DETECTED Final   A.calcoaceticus-baumannii NOT DETECTED NOT DETECTED Final   Bacteroides fragilis NOT DETECTED NOT DETECTED Final   Enterobacterales NOT DETECTED NOT DETECTED Final   Enterobacter cloacae complex NOT DETECTED NOT DETECTED Final   Escherichia coli NOT DETECTED NOT DETECTED Final   Klebsiella aerogenes NOT DETECTED NOT DETECTED Final   Klebsiella oxytoca NOT DETECTED NOT DETECTED Final   Klebsiella pneumoniae NOT DETECTED NOT DETECTED Final   Proteus species NOT DETECTED NOT DETECTED Final   Salmonella species NOT DETECTED NOT DETECTED Final   Serratia marcescens NOT DETECTED NOT DETECTED Final   Haemophilus influenzae NOT DETECTED NOT DETECTED Final   Neisseria meningitidis NOT DETECTED NOT DETECTED Final   Pseudomonas aeruginosa NOT  DETECTED NOT DETECTED Final   Stenotrophomonas maltophilia NOT DETECTED NOT DETECTED Final   Candida albicans NOT DETECTED NOT DETECTED Final   Candida auris NOT DETECTED NOT DETECTED Final   Candida glabrata NOT DETECTED NOT DETECTED Final   Candida krusei NOT DETECTED NOT DETECTED Final   Candida parapsilosis NOT DETECTED NOT DETECTED Final   Candida tropicalis NOT DETECTED NOT DETECTED Final   Cryptococcus neoformans/gattii NOT DETECTED NOT DETECTED Final    Comment: Performed at [facility], [address] [place], [place] [address]  Blood Culture (routine x 2)     Status: None (Preliminary result)   Collection Time: 09/05/22  1:28 PM   Specimen: BLOOD LEFT HAND  Result Value Ref Range Status   Specimen Description BLOOD LEFT HAND  Final   Special Requests   Final    BOTTLES DRAWN AEROBIC AND ANAEROBIC Blood Culture results may not be optimal due to an inadequate volume of blood received in culture bottles   Culture   Final    NO GROWTH 2 DAYS Performed at [facility], [address], [place], [place] [address]    Report Status PENDING  Incomplete  CSF culture w Gram Stain     Status: None (Preliminary result)   Collection Time: 09/05/22  5:56 PM   Specimen: CSF; Cerebrospinal Fluid  Result Value Ref Range Status   Specimen Description CSF  Final   Special Requests LP  Final   Gram Stain   Final    WBC PRESENT, PREDOMINANTLY PMN GRAM POSITIVE COCCI IN PAIRS CYTOSPIN SMEAR CRITICAL RESULT CALLED TO, READ BACK BY AND VERIFIED WITH: RN [place] ALLEN ON 09/05/22 @ 1853 BY DRT    Culture   Final    RARE STREPTOCOCCUS PNEUMONIAE SUSCEPTIBILITIES TO FOLLOW Performed at [facility], [address] [place], [place] [address]    Report Status PENDING  Incomplete  Culture, fungus without smear     Status: None (Preliminary result)   Collection Time: 09/05/22  5:56 PM   Specimen: CSF; Cerebrospinal Fluid  Result Value Ref Range Status   Specimen Description CSF   Final   Special Requests LP  Final   Culture   Final    NO FUNGUS ISOLATED AFTER 2 DAYS Performed at [facility], [address], [place], [place] [address]    Report Status PENDING  Incomplete  MRSA Next Gen by PCR, Nasal     Status: None   Collection Time: 09/05/22  6:41 PM   Specimen: Nasal Mucosa; Nasal Swab  Result Value Ref Range Status   MRSA by PCR Next Gen NOT DETECTED NOT DETECTED Final    Comment: (NOTE) The GeneXpert MRSA Assay (FDA approved for NASAL  specimens only), is one component of a comprehensive MRSA colonization surveillance program. It is not intended to diagnose MRSA infection nor to guide or monitor treatment for MRSA infections. Test performance is not FDA approved in patients less than 74 years old. Performed at [facility], [address], [place], [place] [address]   Culture, blood (Routine X 2)     Status: None (Preliminary result)   Collection Time: 09/05/22  6:53 PM   Specimen: BLOOD  Result Value Ref Range Status   Specimen Description BLOOD RIGHT ANTECUBITAL  Final   Special Requests   Final    BOTTLES DRAWN AEROBIC AND ANAEROBIC Blood Culture adequate volume   Culture   Final    NO GROWTH 2 DAYS Performed at [facility], [address] [place], [place] [address]    Report Status PENDING  Incomplete  Culture, blood (Routine X 2)     Status: None (Preliminary result)   Collection Time: 09/05/22  6:55 PM   Specimen: BLOOD  Result Value Ref Range Status   Specimen Description BLOOD SITE NOT SPECIFIED  Final   Special Requests   Final    BOTTLES DRAWN AEROBIC AND ANAEROBIC Blood Culture results may not be optimal due to an inadequate volume of blood received in culture bottles   Culture   Final    NO GROWTH 2 DAYS Performed at [facility], [address] [place], [place] [address]    Report Status PENDING  Incomplete  Culture, blood (Routine X 2) w Reflex to ID Panel     Status: None (Preliminary result)    Collection Time: 09/06/22 11:40 PM   Specimen: BLOOD  Result Value Ref Range Status   Specimen Description BLOOD BLOOD RIGHT HAND  Final   Special Requests   Final    BOTTLES DRAWN AEROBIC AND ANAEROBIC Blood Culture adequate volume   Culture   Final    NO GROWTH < 12 HOURS Performed at [facility], [address], [place], [place] [address]    Report Status PENDING  Incomplete  Culture, blood (Routine X 2) w Reflex to ID Panel     Status: None (Preliminary result)   Collection Time: 09/06/22 11:40 PM   Specimen: BLOOD  Result Value Ref Range Status   Specimen Description BLOOD BLOOD LEFT HAND  Final   Special Requests   Final    BOTTLES DRAWN AEROBIC AND ANAEROBIC Blood Culture adequate volume   Culture   Final    NO GROWTH < 12 HOURS Performed at [facility], [address] [place], [place] [address]    Report Status PENDING  Incomplete    Impression/Plan:  1. 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  He is on CNS dose ceftriaxone and will continue with vancomycin as well pending sensitivities.    2.  Bacteremia - with Strep pneumonia and repeat blood cultures sent with no growth to date.    3.  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    4.  HIV - viral load noted and is 102,000 c/w poor compliance.  He is getting his ARVs here and will need to continue as an outpatient.   CD4 216.     Dr. Daiva Eves will follow intermittently over the weekend.

## 2022-09-07 NOTE — Progress Notes (Signed)
LTM EEG disconnected - no skin breakdown at unhook.  

## 2022-09-07 NOTE — Progress Notes (Signed)
Neurology Progress Note  Brief HPI: Christopher Farrell is a 62 year old male pt presenting to [facility] ED today 09/05/2022 with altered mental status secondary to sepsis. Patient was seen in the ED on 06/26 with generalized body aches and was found to have multifocal pneumonia, was discharged home on Augmentin and Azithromycin and oral nystatin for oral candida. Patient has a pertinent history of HIV, not on anti-retrovirals despite symptomatic improvement.   Subjective: No family in the room.  RN at the bedside. Patient is intubated and still on sedation, he is on 25 mcg of propofol and 100 mcg of fentanyl, he is also on 6 mcg of Levophed.  RN states she is attempting to titrate sedation down, however in prior attempts he has been biting on the tube.  He is still on LTM  Exam: Vitals:   09/07/22 0730 09/07/22 0734  BP: 96/69   Pulse: 64   Resp: 18   Temp:  98.1 F (36.7 C)  SpO2: 100%    Gen: In bed, NAD Resp: mechanically ventilated Abd: soft, nt  Neuro: Mental Status: Eyes are open, he does track examiner.  He is intermittently following commands on the right side. Cranial Nerves: Pupils 1mm sluggish.  Eyes are midline Head is midline Cough and gag intact Motor/Sensory: Minimal withdrawal in extremities  Pertinent Labs: Meningitis/encephalitis panel - streptococcus pneumoniae WBC 1045 RBC 535 Glucose less than 20 Protein greater than 600  CD4 T Cells 216 CD4% Helper T Cells 13  Component     Latest Ref Rng 09/05/2022  Total lymphocyte count     1,000 - 4,000 /uL 1,563   CD4%     33 - 65 % 13.10 (L)   CD4 absolute     400 - 1,790 /uL 205 (L)   CD8tox     12 - 40 % 72.79 (H)   CD8 T Cell Abs     190 - 1,000 /uL 1,137 (H)   Ratio     1.0 - 3.0  0.18 (L)       Imaging Reviewed: MRI Brain - Multifocal acute/subacute ischemia within the right greater than left cerebral hemispheres and left cerebellum. The largest lesions are located in the right parietal and  occipital lobes. No hemorrhage or mass effect.  CT angio neck-no LVO  LTM EEG 09/05/2022 2036 to 09/06/2022 1140: This study is suggestive of severe diffuse encephalopathy, nonspecific etiology but likely related to sedation. No seizures or epileptiform discharges were seen throughout the recording.   LTM EEG 7/2-This study is suggestive of severe diffuse encephalopathy, nonspecific etiology but likely related to sedation. No seizures or epileptiform discharges were seen throughout the recording.   CSF and blood cultures both (+) for strep pneumo  Labs:  A1c 6.4  Impression:  25M HIV + and noncompliant with HAART initially presented obtunded, GTC seizure, leukocytosis and recent mulitfocal pneumonia and now intubated. CSF and blood cultures both (+) for strep pneumo. LTM negative for seizure activity. MRI brain shows multifocal acute ischemia, c/f septic emboli given vegetation seen on TTE.  Recommendations: - Continue to wean sedation as tolerated - Continue abx per ID (ceftriaxone, vanc) - D/c LTM - TEE pending - LDL ordered   -Continue ASA 81mg  daily + plavix 75mg  daily x21 days f/b ASA 81mg  daily monotherapy after that - q4 hr neuro checks - STAT head CT for any change in neuro exam - Tele - PT/OT/SLP when able to participate - Stroke education - Amb referral  to neurology upon discharge - order placed   Will continue to follow  Gevena Mart DNP, ACNPC-AG  [facility]   Attending Neurohospitalist Addendum Patient seen and examined with APP/Resident. Agree with the history and physical as documented above. Agree with the plan as documented, which I helped formulate. I have edited the note above to reflect my full findings and recommendations. I have independently reviewed the chart, obtained history, review of systems and examined the patient.I have personally reviewed pertinent head/neck/spine imaging (CT/MRI). Please feel free to call with any questions.  This  patient is critically ill and at significant risk of neurological worsening, death and care requires constant monitoring of vital signs, hemodynamics,respiratory and cardiac monitoring, neurological assessment, discussion with family, other specialists and medical decision making of high complexity. I spent 40 minutes of neurocritical care time  in the care of  this patient. This was time spent independent of any time provided by nurse practitioner or PA.  Bing Neighbors, MD [facility] [phone]  If 7pm- 7am, please page neurology on call as listed in AMION.
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
## 2022-09-08 DIAGNOSIS — B953 Streptococcus pneumoniae as the cause of diseases classified elsewhere: Secondary | ICD-10-CM | POA: Insufficient documentation

## 2022-09-08 LAB — CBC
HCT: 27.9 % — ABNORMAL LOW (ref 39.0–52.0)
Hemoglobin: 9.3 g/dL — ABNORMAL LOW (ref 13.0–17.0)
MCH: 29.2 pg (ref 26.0–34.0)
MCHC: 33.3 g/dL (ref 30.0–36.0)
MCV: 87.5 fL (ref 80.0–100.0)
Platelets: 187 10*3/uL (ref 150–400)
RBC: 3.19 MIL/uL — ABNORMAL LOW (ref 4.22–5.81)
RDW: 14.7 % (ref 11.5–15.5)
WBC: 10.6 10*3/uL — ABNORMAL HIGH (ref 4.0–10.5)
nRBC: 0 % (ref 0.0–0.2)

## 2022-09-08 LAB — BASIC METABOLIC PANEL
Anion gap: 12 (ref 5–15)
BUN: 9 mg/dL (ref 8–23)
CO2: 25 mmol/L (ref 22–32)
Calcium: 7.6 mg/dL — ABNORMAL LOW (ref 8.9–10.3)
Chloride: 104 mmol/L (ref 98–111)
Creatinine, Ser: 0.63 mg/dL (ref 0.61–1.24)
GFR, Estimated: >60 mL/min (ref >60)
Glucose, Bld: 97 mg/dL (ref 70–99)
Potassium: 3.3 mmol/L — ABNORMAL LOW (ref 3.5–5.1)
Sodium: 141 mmol/L (ref 135–145)

## 2022-09-08 LAB — GLUCOSE, CAPILLARY
Glucose-Capillary: 105 mg/dL — ABNORMAL HIGH (ref 70–99)
Glucose-Capillary: 104 mg/dL — ABNORMAL HIGH (ref 70–99)
Glucose-Capillary: 105 mg/dL — ABNORMAL HIGH (ref 70–99)
Glucose-Capillary: 108 mg/dL — ABNORMAL HIGH (ref 70–99)

## 2022-09-08 LAB — CSF CULTURE W GRAM STAIN

## 2022-09-08 LAB — CULTURE, BLOOD (ROUTINE X 2): Culture: NO GROWTH

## 2022-09-08 LAB — PHOSPHORUS: Phosphorus: 1.7 mg/dL — ABNORMAL LOW (ref 2.5–4.6)

## 2022-09-08 LAB — MAGNESIUM: Magnesium: 2 mg/dL (ref 1.7–2.4)

## 2022-09-08 MED ORDER — THIAMINE MONONITRATE 100 MG PO TABS
100.0000 mg | ORAL_TABLET | Freq: Every day | ORAL | Status: DC
  Administered 2022-09-08 – 2022-10-17 (×39): 100 mg via ORAL
  Filled 2022-09-08 (×38): qty 1

## 2022-09-08 MED ORDER — DOLUTEGRAVIR SODIUM 50 MG PO TABS
50.0000 mg | ORAL_TABLET | Freq: Every day | ORAL | Status: DC
  Administered 2022-09-08 – 2022-09-09 (×2): 50 mg via ORAL
  Filled 2022-09-08 (×2): qty 1

## 2022-09-08 MED ORDER — ASPIRIN 81 MG PO CHEW
81.0000 mg | CHEWABLE_TABLET | Freq: Every day | ORAL | Status: DC
  Administered 2022-09-08 – 2022-09-10 (×3): 81 mg via ORAL
  Filled 2022-09-08 (×2): qty 1

## 2022-09-08 MED ORDER — MELATONIN 3 MG PO TABS
3.0000 mg | ORAL_TABLET | Freq: Every day | ORAL | Status: DC
  Administered 2022-09-08 – 2022-10-16 (×39): 3 mg via ORAL
  Filled 2022-09-08 (×39): qty 1

## 2022-09-08 MED ORDER — ACETAMINOPHEN 325 MG PO TABS
650.0000 mg | ORAL_TABLET | ORAL | Status: AC | PRN
  Administered 2022-09-08: 650 mg via ORAL
  Filled 2022-09-08: qty 2

## 2022-09-08 MED ORDER — INSULIN ASPART 100 UNIT/ML IJ SOLN: 0.0000 [IU] | Freq: Three times a day (TID) | INTRAMUSCULAR | Status: DC

## 2022-09-08 MED ORDER — ASPIRIN 300 MG RE SUPP
300.0000 mg | Freq: Every day | RECTAL | Status: DC
  Filled 2022-09-08: qty 1

## 2022-09-08 MED ORDER — CLOPIDOGREL BISULFATE 75 MG PO TABS
75.0000 mg | ORAL_TABLET | Freq: Every day | ORAL | Status: DC
  Administered 2022-09-08 – 2022-09-09 (×2): 75 mg via ORAL
  Filled 2022-09-08: qty 1

## 2022-09-08 MED ORDER — POTASSIUM PHOSPHATES 15 MMOLE/5ML IV SOLN
30.0000 mmol | Freq: Once | INTRAVENOUS | Status: AC
  Administered 2022-09-08: 30 mmol via INTRAVENOUS
  Filled 2022-09-08: qty 10

## 2022-09-08 MED ORDER — POTASSIUM CHLORIDE 10 MEQ/100ML IV SOLN
10.0000 meq | INTRAVENOUS | Status: AC
  Administered 2022-09-08 (×2): 10 meq via INTRAVENOUS
  Filled 2022-09-08 (×2): qty 100

## 2022-09-08 NOTE — Progress Notes (Signed)
[facility] ADULT ICU REPLACEMENT PROTOCOL   The patient does apply for the [facility] Adult ICU Electrolyte Replacment Protocol based on the criteria listed below:   1.Exclusion criteria: TCTS, ECMO, Dialysis, and Myasthenia Gravis patients 2. Is GFR >/= 30 ml/min? Yes.    Patient's GFR today is >60 3. Is SCr </= 2? Yes.   Patient's SCr is 0.63 mg/dL 4. Did SCr increase >/= 0.5 in 24 hours? No. 5.Pt's weight >40kg  Yes.   6. Abnormal electrolyte(s): potassium 3.3, phos 1.7  7. Electrolytes replaced per protocol 8.  Call MD STAT for K+ </= 2.5, Phos </= 1, or Mag </= 1 Physician:  protocol  Melvern Banker 09/08/2022 6:35 AM
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
## 2022-09-08 NOTE — Plan of Care (Signed)

## 2022-09-08 NOTE — Progress Notes (Signed)
Inpatient Rehab Admissions Coordinator Note:   Per therapy recommendations patient was screened for CIR candidacy by Stephania Fragmin, PT. At this time, pt appears to be a potential candidate for CIR. I will place an order for rehab consult for full assessment, per our protocol.  Please contact me any with questions.Estill Dooms, PT, DPT [phone] 09/08/22 2:22 PM

## 2022-09-08 NOTE — Progress Notes (Signed)
Physical Therapy Treatment Patient Details Name: Christopher Farrell MRN: [id number] DOB: 1960/09/14 Today's Date: 09/08/2022   History of Present Illness Pt is a 62 y/o male presenting on 7/1 with cough and AMS. Noted presented to ED on 6/26 and found with multifocal pneumonia. Returned to ED after being found unresponsive, admitted with sepsis. 2 seizures in ED. CSF reveals bacterial meningitis. MRI with "multifocal acute/subacute ischemia within the right greater than left hemispheres and left cerebellum. Largest lesions in R parietal and occipital lobes". Intubated 7/1-7/3. PMH: HIV.    PT Comments  Returned for second session to assist RN in returning pt to bed for transfer to new unit. Pt needed modA to transfer to stand and maxA to squat pivot to bed, actively extending his L leg laterally during the transfer. Will continue to follow acutely.     Assistance Recommended at Discharge Frequent or constant Supervision/Assistance  If plan is discharge home, recommend the following:  Can travel by private vehicle    Two people to help with walking and/or transfers;Two people to help with bathing/dressing/bathroom;Assistance with cooking/housework;Direct supervision/assist for medications management;Direct supervision/assist for financial management;Assist for transportation      Equipment Recommendations  BSC/3in1;Wheelchair (measurements PT);Wheelchair cushion (measurements PT);Rolling walker (2 wheels) (pending progress)    Recommendations for Other Services Rehab consult     Precautions / Restrictions Precautions Precautions: Fall Precaution Comments: flexiseal, foley, Bil mitts Restrictions Weight Bearing Restrictions: No     Mobility  Bed Mobility Overal bed mobility: Needs Assistance Bed Mobility: Sit to Supine     Supine to sit: Max assist, +2 for safety/equipment, HOB elevated Sit to supine: Max assist, +2 for physical assistance, +2 for safety/equipment, HOB elevated    General bed mobility comments: MaxAx2 to manage legs and direct trunk to supine    Transfers Overall transfer level: Needs assistance Equipment used: 1 person hand held assist Transfers: Sit to/from Stand, Bed to chair/wheelchair/BSC Sit to Stand: +2 safety/equipment, Mod assist     Squat pivot transfers: Max assist, +2 safety/equipment     General transfer comment: Pt cued to hold onto therapist anterior to him, modA to stand from recliner but maxA to pivot hips to squat pivot to R back to bed with pt actively extending his L leg laterally.    Ambulation/Gait               General Gait Details: unable   Stairs             Wheelchair Mobility     Tilt Bed    Modified Rankin (Stroke Patients Only) Modified Rankin (Stroke Patients Only) Pre-Morbid Rankin Score: No symptoms Modified Rankin: Severe disability     Balance Overall balance assessment: Needs assistance Sitting-balance support: No upper extremity supported, Bilateral upper extremity supported, Feet supported Sitting balance-Leahy Scale: Poor Sitting balance - Comments: posterior R lean requiring mod to max assist Postural control: Posterior lean, Right lateral lean Standing balance support: Bilateral upper extremity supported, During functional activity Standing balance-Leahy Scale: Poor Standing balance comment: relies on BUE and external support                            Cognition Arousal/Alertness: Lethargic Behavior During Therapy: Flat affect, Restless Overall Cognitive Status: Impaired/Different from baseline Area of Impairment: Orientation, Attention, Memory, Following commands, Safety/judgement, Awareness, Problem solving                 Orientation Level: Disoriented to,  Place, Time, Situation Current Attention Level: Focused Memory: Decreased recall of precautions, Decreased short-term memory Following Commands: Follows one step commands with increased time,  Follows one step commands inconsistently Safety/Judgement: Decreased awareness of safety, Decreased awareness of deficits Awareness: Intellectual Problem Solving: Slow processing, Decreased initiation, Difficulty sequencing, Requires verbal cues, Requires tactile cues General Comments: He follows simple commands with increased time but inconsistent.  HOH.  Fidigeting and requires max redirection to avoid pulling at lines/tubes        Exercises      General Comments General comments (skin integrity, edema, etc.): VSS on RA; RN providing +2 for safety      Pertinent Vitals/Pain Pain Assessment Pain Assessment: Faces Faces Pain Scale: Hurts little more Pain Location: generalized Pain Descriptors / Indicators: Discomfort, Grimacing Pain Intervention(s): Limited activity within patient's tolerance, Monitored during session, Repositioned    Home Living Family/patient expects to be discharged to:: Private residence Living Arrangements: Parent (mom) Available Help at Discharge: Family;Available 24 hours/day Type of Home: House Home Access: Level entry       Home Layout: One level Home Equipment: None      Prior Function            PT Goals (current goals can now be found in the care plan section) Acute Rehab PT Goals Patient Stated Goal: did not state; family desires for pt to improve PT Goal Formulation: With patient/family Time For Goal Achievement: 09/22/22 Potential to Achieve Goals: Good Progress towards PT goals: Progressing toward goals    Frequency    Min 4X/week      PT Plan Current plan remains appropriate    Co-evaluation        AM-PAC PT "6 Clicks" Mobility   Outcome Measure  Help needed turning from your back to your side while in a flat bed without using bedrails?: A Lot Help needed moving from lying on your back to sitting on the side of a flat bed without using bedrails?: A Lot Help needed moving to and from a bed to a chair (including a  wheelchair)?: A Lot Help needed standing up from a chair using your arms (e.g., wheelchair or bedside chair)?: A Lot Help needed to walk in hospital room?: Total Help needed climbing 3-5 steps with a railing? : Total 6 Click Score: 10    End of Session Equipment Utilized During Treatment: Gait belt Activity Tolerance: Patient tolerated treatment well Patient left: with call bell/phone within reach;with family/visitor present;in bed;with nursing/sitter in room;with restraints reapplied (pt transferring to new floor so bed alarm not on yet) Nurse Communication: Mobility status PT Visit Diagnosis: Unsteadiness on feet (R26.81);Other abnormalities of gait and mobility (R26.89);Muscle weakness (generalized) (M62.81);Difficulty in walking, not elsewhere classified (R26.2)     Time: 1100-1110 PT Time Calculation (min) (ACUTE ONLY): 10 min  Charges:    $Therapeutic Activity: 8-22 mins PT General Charges $$ ACUTE PT VISIT: 1 Visit                     Raymond Gurney, PT, DPT Acute Rehabilitation Services  Office: [phone]    Jewel Baize 09/08/2022, 1:34 PM
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
## 2022-09-08 NOTE — Evaluation (Signed)
Occupational Therapy Evaluation Patient Details Name: Christopher Farrell MRN: [id number] DOB: 07-29-1960 Today's Date: 09/08/2022   History of Present Illness Pt is a 62 y/o male presenting on 7/1 with cough and AMS. Noted presented to ED on 6/26 and found with multifocal pneumonia. Returned to ED after being found unresponsive, admitted with sepsis. 2 seizures in ED. CSF reveals bacterial meningitis. MRI with "multifocal acute/subacute ischemia within the right greater than left hemispheres and left cerebellum. Largest lesions in R parietal and occipital lobes". Intubated 7/1-7/3. PMH: HIV.   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  Pts mom and sister present during session, report pt lives with his mom in a one level home. Admitted for above and presents with problem list below.  Today, he was oriented to self only; follows simple commands with delay in non distracting environment but demonstrating poor attention, awareness, problem solving and safety.  Cognition difficult to assess due to [facility]. He requires redirection throughout session as tends to fidget with lines/tubes. He requires max assist +2 for squat pivot to recliner, min to total assist +2 for ADLs.  Based on performance today, believe pt will best benefit from continued OT services acutely and after dc at an inpatient setting with >3hrs/day to optimize independence, safety and return to PLOF with ADLs and mobility.       Recommendations for follow up therapy are one component of a multi-disciplinary discharge planning process, led by the attending physician.  Recommendations may be updated based on patient status, additional functional criteria and insurance authorization.   Assistance Recommended at Discharge Frequent or constant Supervision/Assistance  Patient can return home with the following Two people to help with walking and/or transfers;Two people  to help with bathing/dressing/bathroom;Assistance with cooking/housework;Direct supervision/assist for medications management;Direct supervision/assist for financial management;Help with stairs or ramp for entrance;Assist for transportation    Functional Status Assessment  Patient has had a recent decline in their functional status and demonstrates the ability to make significant improvements in function in a reasonable and predictable amount of time.  Equipment Recommendations  Other (comment) (defer)    Recommendations for Other Services Rehab consult;Speech consult     Precautions / Restrictions Precautions Precautions: Fall Precaution Comments: flexiseal, foley, B mitts Restrictions Weight Bearing Restrictions: No      Mobility Bed Mobility Overal bed mobility: Needs Assistance Bed Mobility: Supine to Sit     Supine to sit: Max assist, +2 for safety/equipment, HOB elevated     General bed mobility comments: pt requires assist for BLEs, trunk and scooting to EOB.  pt with poor initation and sequencing    Transfers Overall transfer level: Needs assistance Equipment used: Rolling walker (2 wheels), 2 person hand held assist Transfers: Sit to/from Stand, Bed to chair/wheelchair/BSC Sit to Stand: Mod assist, +2 physical assistance, +2 safety/equipment   Squat pivot transfers: Max assist, +2 physical assistance, +2 safety/equipment       General transfer comment: pt stood from EOB x 2, mod assist to fully ascend but poor tolerance; transitioned to hand held assist for squat pivot towards L side into recliner with max assist +2      Balance Overall balance assessment: Needs assistance Sitting-balance support: No upper extremity supported, Bilateral upper extremity supported, Feet supported Sitting balance-Leahy Scale: Poor Sitting balance - Comments: posterior R lean requiring mod to max assist Postural control: Posterior lean, Right lateral lean Standing balance  support: Bilateral upper extremity supported,  During functional activity Standing balance-Leahy Scale: Poor Standing balance comment: relies on BUE and external support +2 assist                           ADL either performed or assessed with clinical judgement   ADL Overall ADL's : Needs assistance/impaired     Grooming: Maximal assistance;Sitting           Upper Body Dressing : Total assistance;Sitting   Lower Body Dressing: Total assistance;+2 for physical assistance;+2 for safety/equipment;Sit to/from stand   Toilet Transfer: +2 for physical assistance;+2 for safety/equipment;Maximal assistance;Squat-pivot Toilet Transfer Details (indicate cue type and reason): simulated to recliner         Functional mobility during ADLs: Maximal assistance;+2 for physical assistance;+2 for safety/equipment;Cueing for safety;Cueing for sequencing       Vision Baseline Vision/History: 0 No visual deficits;1 Wears glasses (readers) Ability to See in Adequate Light: 0 Adequate Patient Visual Report: No change from baseline Additional Comments: difficult to assess, able to scan to L and R following thearpist. but due to cognition very difficult to assess (pt not answering when asked to read clock)     Perception Perception Perception Tested?: Yes Perception Deficits: Inattention/neglect Comments: L inattention?  turned to R in chair and not following commands to look to L towards window or family   Praxis      Pertinent Vitals/Pain Pain Assessment Pain Assessment: Faces Faces Pain Scale: Hurts even more Pain Location: all over with movement Pain Descriptors / Indicators: Discomfort Pain Intervention(s): Limited activity within patient's tolerance, Monitored during session, Repositioned     Hand Dominance Right   Extremity/Trunk Assessment Upper Extremity Assessment Upper Extremity Assessment: Difficult to assess due to impaired cognition (pt moving UEs but resistive  to testing or full ROM, appears grossly 4/5 BUEs. Mild edema in R hand)   Lower Extremity Assessment Lower Extremity Assessment: Defer to PT evaluation       Communication Communication Communication: HOH;Expressive difficulties;Receptive difficulties (appears to hear better in R ear?)   Cognition Arousal/Alertness: Lethargic Behavior During Therapy: Flat affect, Restless Overall Cognitive Status: Impaired/Different from baseline Area of Impairment: Orientation, Attention, Memory, Following commands, Safety/judgement, Awareness, Problem solving                 Orientation Level: Disoriented to, Place, Time, Situation Current Attention Level: Focused Memory: Decreased recall of precautions, Decreased short-term memory Following Commands: Follows one step commands with increased time, Follows one step commands inconsistently Safety/Judgement: Decreased awareness of safety, Decreased awareness of deficits Awareness: Intellectual Problem Solving: Slow processing, Decreased initiation, Difficulty sequencing, Requires verbal cues, Requires tactile cues General Comments: Patient oriented to self only.  He follows simple commands with increased time but inconsistent.  HOH.  Fidigeting and requires max redirection to avoid pulling at lines/tubes     General Comments  sister and mom present, reponds well to mom    Exercises     Shoulder Instructions      Home Living Family/patient expects to be discharged to:: Private residence Living Arrangements: Parent (mom) Available Help at Discharge: Family;Available 24 hours/day Type of Home: House Home Access: Level entry     Home Layout: One level     Bathroom Shower/Tub: Chief Strategy Officer: Standard     Home Equipment: None          Prior Functioning/Environment Prior Level of Function : Independent/Modified Independent;Working/employed  ADLs Comments: works as a Engineer, water at [employer],  not driving but uses public transportation or walks to work        OT Problem List: Decreased strength;Decreased activity tolerance;Impaired balance (sitting and/or standing);Impaired vision/perception;Decreased coordination;Decreased cognition;Decreased safety awareness;Decreased knowledge of use of DME or AE;Decreased knowledge of precautions      OT Treatment/Interventions: Self-care/ADL training;Energy conservation;DME and/or AE instruction;Balance training;Patient/family education;Cognitive remediation/compensation;Visual/perceptual remediation/compensation;Therapeutic activities;Neuromuscular education    OT Goals(Current goals can be found in the care plan section) Acute Rehab OT Goals Patient Stated Goal: none stated OT Goal Formulation: Patient unable to participate in goal setting Time For Goal Achievement: 09/22/22 Potential to Achieve Goals: Fair  OT Frequency: Min 2X/week    Co-evaluation PT/OT/SLP Co-Evaluation/Treatment: Yes Reason for Co-Treatment: Necessary to address cognition/behavior during functional activity;For patient/therapist safety;To address functional/ADL transfers   OT goals addressed during session: ADL's and self-care      AM-PAC OT "6 Clicks" Daily Activity     Outcome Measure Help from another person eating meals?: A Lot Help from another person taking care of personal grooming?: A Lot Help from another person toileting, which includes using toliet, bedpan, or urinal?: Total Help from another person bathing (including washing, rinsing, drying)?: A Lot Help from another person to put on and taking off regular upper body clothing?: A Lot Help from another person to put on and taking off regular lower body clothing?: Total 6 Click Score: 10   End of Session Equipment Utilized During Treatment: Gait belt;Rolling walker (2 wheels) Nurse Communication: Mobility status;Precautions  Activity Tolerance: Patient tolerated treatment well Patient left: in  chair;with call bell/phone within reach;with chair alarm set;with family/visitor present;with restraints reapplied;with nursing/sitter in room  OT Visit Diagnosis: Other abnormalities of gait and mobility (R26.89);Muscle weakness (generalized) (M62.81);Pain;Other symptoms and signs involving cognitive function;Cognitive communication deficit (R41.841);Other symptoms and signs involving the nervous system (R29.898) Pain - part of body:  (generalized)                Time: 8657-8469 OT Time Calculation (min): 34 min Charges:  OT General Charges $OT Visit: 1 Visit OT Evaluation $OT Eval Moderate Complexity: 1 Mod  Barry Brunner, OT Acute Rehabilitation Services Office [phone]   Chancy Milroy 09/08/2022, 11:50 AM

## 2022-09-08 NOTE — Progress Notes (Addendum)
Per Dr. Daiva Eves with infectious disease wait until 7/5 to place PICC.  Dr. Denese Killings and bedside RN updated.

## 2022-09-08 NOTE — Evaluation (Signed)
Physical Therapy Evaluation Patient Details Name: Christopher Farrell MRN: [id number] DOB: 01-06-1961 Today's Date: 09/08/2022  History of Present Illness  Pt is a 62 y/o male presenting on 7/1 with cough and AMS. Noted presented to ED on 6/26 and found with multifocal pneumonia. Returned to ED after being found unresponsive, admitted with sepsis. 2 seizures in ED. CSF reveals bacterial meningitis. MRI with "multifocal acute/subacute ischemia within the right greater than left hemispheres and left cerebellum. Largest lesions in R parietal and occipital lobes". Intubated 7/1-7/3. PMH: HIV.   Clinical Impression  Pt presents with condition above and deficits mentioned below, see PT Problem List. PTA, he was independent without DME, living with his mother in a 1-level house with a level entrance. Currently, pt is requiring maxAx2 for bed mobility, modAx2 for transfers sit <> stand, and maxAx2 to squat pivot transfer from bed to recliner with bil HHA. He displays deficits either in hearing or communication along with deficits in initiation, sequencing, and awareness. In addition, pt is able to lift bil legs against gravity but displays generalized weakness. It is unclear if one side is stronger than the other as it was difficult to formally assess due to his cognitive deficits at this time. Pt is at high risk for falls and would greatly benefit from intensive inpatient rehab, >3 hours/day. Will continue to follow acutely.        Assistance Recommended at Discharge Frequent or constant Supervision/Assistance  If plan is discharge home, recommend the following:  Can travel by private vehicle  Two people to help with walking and/or transfers;Two people to help with bathing/dressing/bathroom;Assistance with cooking/housework;Direct supervision/assist for medications management;Direct supervision/assist for financial management;Assist for transportation        Equipment Recommendations BSC/3in1;Wheelchair  (measurements PT);Wheelchair cushion (measurements PT);Rolling walker (2 wheels) (pending progress)  Recommendations for Other Services  Rehab consult    Functional Status Assessment Patient has had a recent decline in their functional status and demonstrates the ability to make significant improvements in function in a reasonable and predictable amount of time.     Precautions / Restrictions Precautions Precautions: Fall Precaution Comments: flexiseal, foley, Bil mitts Restrictions Weight Bearing Restrictions: No      Mobility  Bed Mobility Overal bed mobility: Needs Assistance Bed Mobility: Supine to Sit     Supine to sit: Max assist, +2 for safety/equipment, HOB elevated     General bed mobility comments: pt requires assist for BLEs, trunk and scooting to EOB.  pt with poor initation and sequencing    Transfers Overall transfer level: Needs assistance Equipment used: Rolling walker (2 wheels), 2 person hand held assist Transfers: Sit to/from Stand, Bed to chair/wheelchair/BSC Sit to Stand: Mod assist, +2 physical assistance, +2 safety/equipment     Squat pivot transfers: Max assist, +2 physical assistance, +2 safety/equipment     General transfer comment: pt stood from EOB x 2, mod assist to fully ascend but poor tolerance, often sitting back down; transitioned to hand held assist for squat pivot towards L side into recliner with max assist +2    Ambulation/Gait               General Gait Details: unable  Stairs            Wheelchair Mobility     Tilt Bed    Modified Rankin (Stroke Patients Only) Modified Rankin (Stroke Patients Only) Pre-Morbid Rankin Score: No symptoms Modified Rankin: Severe disability     Balance Overall balance assessment: Needs assistance Sitting-balance  support: No upper extremity supported, Bilateral upper extremity supported, Feet supported Sitting balance-Leahy Scale: Poor Sitting balance - Comments: posterior R  lean requiring mod to max assist Postural control: Posterior lean, Right lateral lean Standing balance support: Bilateral upper extremity supported, During functional activity Standing balance-Leahy Scale: Poor Standing balance comment: relies on BUE and external support +2 assist                             Pertinent Vitals/Pain Pain Assessment Pain Assessment: Faces Faces Pain Scale: Hurts even more Pain Location: all over with movement Pain Descriptors / Indicators: Discomfort, Grimacing Pain Intervention(s): Limited activity within patient's tolerance, Monitored during session, Repositioned    Home Living Family/patient expects to be discharged to:: Private residence Living Arrangements: Parent (mom) Available Help at Discharge: Family;Available 24 hours/day Type of Home: House Home Access: Level entry       Home Layout: One level Home Equipment: None      Prior Function Prior Level of Function : Independent/Modified Independent;Working/employed             Mobility Comments: No AD ADLs Comments: 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, not driving but uses public transportation or walks to work     Higher education careers adviser   Dominant Hand: Right    Extremity/Trunk Assessment   Upper Extremity Assessment Upper Extremity Assessment: Defer to OT evaluation    Lower Extremity Assessment Lower Extremity Assessment: Generalized weakness;Difficult to assess due to impaired cognition (difficult to formally assess due to impaired cognition, but appears grossly 4/5 MMT bil)       Communication   Communication: HOH;Expressive difficulties;Receptive difficulties (appears to hear better in R ear?)  Cognition Arousal/Alertness: Lethargic Behavior During Therapy: Flat affect, Restless Overall Cognitive Status: Impaired/Different from baseline Area of Impairment: Orientation, Attention, Memory, Following commands, Safety/judgement, Awareness, Problem  solving                 Orientation Level: Disoriented to, Place, Time, Situation Current Attention Level: Focused Memory: Decreased recall of precautions, Decreased short-term memory Following Commands: Follows one step commands with increased time, Follows one step commands inconsistently Safety/Judgement: Decreased awareness of safety, Decreased awareness of deficits Awareness: Intellectual Problem Solving: Slow processing, Decreased initiation, Difficulty sequencing, Requires verbal cues, Requires tactile cues General Comments: Patient oriented to self only.  He follows simple commands with increased time but inconsistent.  HOH.  Fidigeting and requires max redirection to avoid pulling at lines/tubes        General Comments General comments (skin integrity, edema, etc.): sister and mom present, reponds well to mom; VSS on RA    Exercises     Assessment/Plan    PT Assessment Patient needs continued PT services  PT Problem List Decreased strength;Decreased activity tolerance;Decreased balance;Decreased mobility;Decreased cognition;Decreased coordination;Decreased knowledge of use of DME;Decreased safety awareness       PT Treatment Interventions DME instruction;Gait training;Functional mobility training;Therapeutic activities;Therapeutic exercise;Balance training;Neuromuscular re-education;Cognitive remediation;Patient/family education    PT Goals (Current goals can be found in the Care Plan section)  Acute Rehab PT Goals Patient Stated Goal: did not state; family desires for pt to improve PT Goal Formulation: With patient/family Time For Goal Achievement: 09/22/22 Potential to Achieve Goals: Good    Frequency Min 4X/week     Co-evaluation PT/OT/SLP Co-Evaluation/Treatment: Yes Reason for Co-Treatment: Necessary to address cognition/behavior during functional activity;For patient/therapist safety;To address functional/ADL transfers PT goals addressed during session:  Mobility/safety with  mobility;Balance;Proper use of DME OT goals addressed during session: ADL's and self-care       AM-PAC PT "6 Clicks" Mobility  Outcome Measure Help needed turning from your back to your side while in a flat bed without using bedrails?: Total Help needed moving from lying on your back to sitting on the side of a flat bed without using bedrails?: Total Help needed moving to and from a bed to a chair (including a wheelchair)?: Total Help needed standing up from a chair using your arms (e.g., wheelchair or bedside chair)?: Total Help needed to walk in hospital room?: Total Help needed climbing 3-5 steps with a railing? : Total 6 Click Score: 6    End of Session Equipment Utilized During Treatment: Gait belt Activity Tolerance: Patient tolerated treatment well Patient left: in chair;with call bell/phone within reach;with chair alarm set;with family/visitor present;with restraints reapplied Nurse Communication: Mobility status PT Visit Diagnosis: Unsteadiness on feet (R26.81);Other abnormalities of gait and mobility (R26.89);Muscle weakness (generalized) (M62.81);Difficulty in walking, not elsewhere classified (R26.2)    Time: 1610-9604 PT Time Calculation (min) (ACUTE ONLY): 35 min   Charges:   PT Evaluation $PT Eval Moderate Complexity: 1 Mod   PT General Charges $$ ACUTE PT VISIT: 1 Visit         Raymond Gurney, PT, DPT Acute Rehabilitation Services  Office: [phone]   Jewel Baize 09/08/2022, 1:26 PM

## 2022-09-09 ENCOUNTER — Other Ambulatory Visit: Payer: Self-pay

## 2022-09-09 DIAGNOSIS — G001 Pneumococcal meningitis: Secondary | ICD-10-CM | POA: Insufficient documentation

## 2022-09-09 DIAGNOSIS — I76 Septic arterial embolism: Secondary | ICD-10-CM

## 2022-09-09 DIAGNOSIS — G009 Bacterial meningitis, unspecified: Secondary | ICD-10-CM

## 2022-09-09 DIAGNOSIS — I330 Acute and subacute infective endocarditis: Secondary | ICD-10-CM | POA: Diagnosis present

## 2022-09-09 DIAGNOSIS — I669 Occlusion and stenosis of unspecified cerebral artery: Secondary | ICD-10-CM | POA: Insufficient documentation

## 2022-09-09 DIAGNOSIS — I5189 Other ill-defined heart diseases: Secondary | ICD-10-CM

## 2022-09-09 LAB — BASIC METABOLIC PANEL
Anion gap: 9 (ref 5–15)
BUN: 7 mg/dL — ABNORMAL LOW (ref 8–23)
CO2: 24 mmol/L (ref 22–32)
Calcium: 7.6 mg/dL — ABNORMAL LOW (ref 8.9–10.3)
Chloride: 100 mmol/L (ref 98–111)
Creatinine, Ser: 0.54 mg/dL — ABNORMAL LOW (ref 0.61–1.24)
GFR, Estimated: >60 mL/min (ref >60)
Glucose, Bld: 99 mg/dL (ref 70–99)
Potassium: 3.7 mmol/L (ref 3.5–5.1)
Sodium: 133 mmol/L — ABNORMAL LOW (ref 135–145)

## 2022-09-09 LAB — GLUCOSE, CAPILLARY
Glucose-Capillary: 89 mg/dL (ref 70–99)
Glucose-Capillary: 94 mg/dL (ref 70–99)
Glucose-Capillary: 78 mg/dL (ref 70–99)
Glucose-Capillary: 84 mg/dL (ref 70–99)

## 2022-09-09 LAB — PHOSPHORUS: Phosphorus: 3.4 mg/dL (ref 2.5–4.6)

## 2022-09-09 MED ORDER — SODIUM CHLORIDE 0.9% FLUSH
10.0000 mL | Freq: Two times a day (BID) | INTRAVENOUS | Status: DC
  Administered 2022-09-09 – 2022-09-29 (×42): 10 mL
  Administered 2022-09-30: 20 mL
  Administered 2022-09-30: 10 mL
  Administered 2022-10-01: 20 mL
  Administered 2022-10-01 – 2022-10-17 (×32): 10 mL

## 2022-09-09 MED ORDER — SODIUM CHLORIDE 0.9% FLUSH
10.0000 mL | INTRAVENOUS | Status: DC | PRN
  Administered 2022-09-30: 10 mL

## 2022-09-09 MED ORDER — BICTEGRAVIR-EMTRICITAB-TENOFOV 50-200-25 MG PO TABS
1.0000 | ORAL_TABLET | Freq: Every day | ORAL | Status: DC
  Administered 2022-09-10 – 2022-09-11 (×2): 1 via ORAL
  Filled 2022-09-09 (×2): qty 1

## 2022-09-09 MED ORDER — POTASSIUM CHLORIDE CRYS ER 20 MEQ PO TBCR: 40.0000 meq | EXTENDED_RELEASE_TABLET | Freq: Once | ORAL | Status: DC

## 2022-09-09 NOTE — Progress Notes (Addendum)
FMTS Interim Progress Note  S: Patient seen at bedside with Dr. Melissa Noon. Patient slow to respond but will answer questions appropriately. Patient expresses he has no complaints at this time.   O: BP (!) 129/93 (BP Location: Left Arm)   Pulse 92   Temp 98.9 F (37.2 C) (Oral)   Resp 17   Wt 52.8 kg   SpO2 97%   BMI 19.98 kg/m    Laying comfortably in bed in no acute distress.    Physical Exam Vitals reviewed.  Constitutional:      General: He is not in acute distress. Cardiovascular:     Rate and Rhythm: Normal rate.  Pulmonary:     Effort: Pulmonary effort is normal.  Neurological:     Mental Status: He is alert.      A/P: 62 yo M with PMHx of HIV not on therapy who is admitted for strep pneumoniae meningitis to ICU and was transferred to floor today. Patient is hemodynamically stable at this time with no acute complaints  Meningitis d/t strep pneumoniae - patient on IV rocephin, plan to continue at this time - monitor AM labs (CBC, lipid, BMP) - tylenol prn for pain       Tammala Weider Georg Ruddle, MD 09/09/2022, 9:03 PM PGY-1, [facility] Family Medicine Service pager [phone]

## 2022-09-09 NOTE — TOC CM/SW Note (Signed)
Transition of Care [facility]) - Inpatient Brief Assessment   Patient Details  Name: Christopher Farrell MRN: [id number] Date of Birth: [birth year]  Transition of Care [facility]) CM/SW Contact:    Baldemar Lenis, LCSW Phone Number: 09/09/2022, 10:39 AM   Clinical Narrative:   Patient from home with mother, current recommendation for CIR, workup pending. CSW to follow for disposition.    Transition of Care Asessment: Insurance and Status: Insurance coverage has been reviewed Patient has primary care physician: No Home environment has been reviewed: Home with mom Prior level of function:: Independent Prior/Current Home Services: No current home services Social Determinants of Health Reivew: SDOH reviewed no interventions necessary Readmission risk has been reviewed: Yes Transition of care needs: transition of care needs identified, TOC will continue to follow

## 2022-09-09 NOTE — Assessment & Plan Note (Signed)
Murmur was noted on exam. 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 LVEF 60-65% - f/u TEE 7/8 and cardiac MRI to further assess

## 2022-09-09 NOTE — Assessment & Plan Note (Signed)
Patient was admitted for bacterial meningitis.  Mentation has improved since admission.  He is aware of his name, location, month.  - assess mental status daily to monitor improvement - STAT head CT for any change in neuro exam

## 2022-09-09 NOTE — Assessment & Plan Note (Addendum)
Patient is mentating well and complains of no pain today.  He did have a headache this morning. - Continue Rocephin (5/14 days; 7/1-7/15)

## 2022-09-09 NOTE — Assessment & Plan Note (Addendum)
Neurology started DAPT due to signs of stroke in the ED. However, due to concern for hemorrhagic transformation of the septic embolic we consulted and reassessed. - Neurology agreed to discontinue DAPT and continue patient on ASA 81 mg due to risk vs benefit

## 2022-09-09 NOTE — Progress Notes (Cosign Needed Addendum)
Daily Progress Note Intern Pager: [phone]  Patient name: Christopher Farrell Medical record number: [id number] Date of birth: [birth year] Age: 62 y.o. Gender: male  Primary Care Provider: Pcp, No Consultants: Cardiology, CCM, ID Code Status: Full Code  Pt Overview and Major Events to Date:  7/1: Admission and Intubation 7/4: Extubated  Assessment and Plan: Christopher Farrell is a 62 year old male who presented to the ED with altered mental status secondary to sepsis with a past medical history of HIV, not on antiretrovirals by symptomatic improvement. He was previously admitted 6/26 multifocal pneumonia and was discharged home on Augmentin and azithromycin and oral nystatin for oral Candida. Upon admission in the ED, patient had a 2-minute seizure which terminated on its own and required intubation for airway protection in the setting of altered mental status and copious secretions.   Hospital Problem List      Hospital     * (Principal) Meningitis due to Streptococcus pneumoniae     Patient is mentating well and complains of no pain today.  He did have  a headache this morning. - Continue Rocephin (5/14 days; 7/1-7/15)        Altered mental status     Patient was admitted for bacterial meningitis.  Mentation has improved  since admission.  He is aware of his name, location, month.  - assess mental status daily to monitor improvement - STAT head CT for any change in neuro exam         HIV infection (HCC)     By report, patient has not been taking his [facility] outpatient. While  in the ICU patient was restarted on Tivicay and Descovy. - Continue Tivicay and Descovy.        Atrial mass     Murmur was noted on exam. 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 However, due  to concern for hemorrhagic transformation of the septic embolic we  consulted and reassessed. - Neurology agreed to discontinue DAPT and continue patient on ASA 81 mg  due to risk vs benefit      FEN/GI: Dysphagia 1 PPx: Lovenox Dispo:pending clinical improvement  Subjective:  Patient was extubated yesterday and had some complaints of pain with swallowing. Patient is doing well overall, no other complaints of pain and had a slight headache this morning. Nurse reports that she has been pocketing his food in his mouth, but reports no other major concerns.   Objective: Temp:  [97.7 F (36.5 C)-100.2 F (37.9 C)] 98.9 F (37.2 C) (07/05 1600) Pulse Rate:  [81-98] 86 (07/05 1600) Resp:  [16-20] 20 (07/05 1600) BP: (122-134)/(84-91) 130/90 (07/05 1600) SpO2:  [98 %-100 %] 98 % (07/05 1600) Weight:  [52.8 kg] 52.8 kg (07/05 0500)  Physical Exam: General: NAD, awake and alert. Cardiovascular: RRR. Mitral regurg murmur. Respiratory: CTAB, normal WOB on RA Abdomen: soft, nontender, nondistended Extremities: no BLE edema Neuro: A&Ox3. No focal neurological deficits.  Laboratory: Most recent CBC Lab Results  Component Value Date   WBC 10.6 (H) 09/08/2022   HGB 9.3 (L) 09/08/2022   HCT 27.9 (L) 09/08/2022   MCV 87.5 09/08/2022   PLT 187 09/08/2022   Most recent BMP  Latest Ref Rng & Units 09/09/2022    1:24 PM  BMP  Glucose 70 - 99 mg/dL 99   BUN 8 - 23 mg/dL 7   Creatinine 1.61 - 0.96 mg/dL 0.45   Sodium 409 - 811 mmol/L 133   Potassium 3.5 - 5.1 mmol/L 3.7   Chloride 98 - 111 mmol/L 100   CO2 22 - 32 mmol/L 24   Calcium 8.9 - 10.3 mg/dL 7.6     Imaging/Diagnostic Tests: No new imaging.   Fortunato Curling, DO 09/09/2022, 4:59 PM PGY-1, [facility] Family Medicine  FPTS Intern pager: [phone], text pages welcome Secure chat group [facility] Teaching Service

## 2022-09-09 NOTE — Progress Notes (Signed)
Peripherally Inserted Central Catheter Placement  The IV Nurse has discussed with the patient and/or persons authorized to consent for the patient, the purpose of this procedure and the potential benefits and risks involved with this procedure.  The benefits include less needle sticks, lab draws from the catheter, and the patient may be discharged home with the catheter. Risks include, but not limited to, infection, bleeding, blood clot (thrombus formation), and puncture of an artery; nerve damage and irregular heartbeat and possibility to perform a PICC exchange if needed/ordered by physician.  Alternatives to this procedure were also discussed.  Bard Power PICC patient education guide, fact sheet on infection prevention and patient information card has been provided to patient /or left at bedside.   Consent obtained with mother via telephone   PICC Placement Documentation  PICC Single Lumen 09/09/22 Right Basilic 38 cm 1 cm (Active)  Indication for Insertion or Continuance of Line Prolonged intravenous therapies 09/09/22 1000  Exposed Catheter (cm) 1 cm 09/09/22 1000  Site Assessment Clean, Dry, Intact 09/09/22 1000  Line Status Flushed;Saline locked;Blood return noted 09/09/22 1000  Dressing Type Transparent;Securing device 09/09/22 1000  Dressing Status Antimicrobial disc in place;Clean, Dry, Intact 09/09/22 1000  Safety Lock Not Applicable 09/09/22 1000  Line Care Connections checked and tightened 09/09/22 1000  Line Adjustment (NICU/IV Team Only) No 09/09/22 1000  Dressing Intervention New dressing 09/09/22 1000  Dressing Change Due 09/16/22 09/09/22 1000       Franne Grip Renee 09/09/2022, 10:25 AM

## 2022-09-09 NOTE — Plan of Care (Signed)
  Problem: Education: Goal: Ability to describe self-care measures that may prevent or decrease complications (Diabetes Survival Skills Education) will improve Outcome: Progressing Goal: Individualized Educational Video(s) Outcome: Progressing   Problem: Coping: Goal: Ability to adjust to condition or change in health will improve Outcome: Progressing   Problem: Fluid Volume: Goal: Ability to maintain a balanced intake and output will improve Outcome: Progressing   Problem: Health Behavior/Discharge Planning: Goal: Ability to identify and utilize available resources and services will improve Outcome: Progressing Goal: Ability to manage health-related needs will improve Outcome: Progressing   Problem: Metabolic: Goal: Ability to maintain appropriate glucose levels will improve Outcome: Progressing   Problem: Nutritional: Goal: Maintenance of adequate nutrition will improve Outcome: Progressing Goal: Progress toward achieving an optimal weight will improve Outcome: Progressing   Problem: Skin Integrity: Goal: Risk for impaired skin integrity will decrease Outcome: Progressing   Problem: Tissue Perfusion: Goal: Adequacy of tissue perfusion will improve Outcome: Progressing   Problem: Education: Goal: Knowledge of General Education information will improve Description: Including pain rating scale, medication(s)/side effects and non-pharmacologic comfort measures Outcome: Progressing   Problem: Health Behavior/Discharge Planning: Goal: Ability to manage health-related needs will improve Outcome: Progressing   Problem: Clinical Measurements: Goal: Ability to maintain clinical measurements within normal limits will improve Outcome: Progressing Goal: Will remain free from infection Outcome: Progressing Goal: Diagnostic test results will improve Outcome: Progressing Goal: Respiratory complications will improve Outcome: Progressing Goal: Cardiovascular complication will  be avoided Outcome: Progressing   Problem: Activity: Goal: Risk for activity intolerance will decrease Outcome: Progressing   Problem: Nutrition: Goal: Adequate nutrition will be maintained Outcome: Progressing   Problem: Coping: Goal: Level of anxiety will decrease Outcome: Progressing   Problem: Elimination: Goal: Will not experience complications related to bowel motility Outcome: Progressing Goal: Will not experience complications related to urinary retention Outcome: Progressing   Problem: Pain Managment: Goal: General experience of comfort will improve Outcome: Progressing   Problem: Safety: Goal: Ability to remain free from injury will improve Outcome: Progressing   Problem: Skin Integrity: Goal: Risk for impaired skin integrity will decrease Outcome: Progressing   Problem: Education: Goal: Knowledge of disease or condition will improve Outcome: Progressing Goal: Knowledge of secondary prevention will improve (MUST DOCUMENT ALL) Outcome: Progressing Goal: Knowledge of patient specific risk factors will improve (Mark N/A or DELETE if not current risk factor) Outcome: Progressing   Problem: Ischemic Stroke/TIA Tissue Perfusion: Goal: Complications of ischemic stroke/TIA will be minimized Outcome: Progressing   Problem: Coping: Goal: Will verbalize positive feelings about self Outcome: Progressing Goal: Will identify appropriate support needs Outcome: Progressing   Problem: Health Behavior/Discharge Planning: Goal: Ability to manage health-related needs will improve Outcome: Progressing Goal: Goals will be collaboratively established with patient/family Outcome: Progressing   Problem: Self-Care: Goal: Ability to participate in self-care as condition permits will improve Outcome: Progressing Goal: Verbalization of feelings and concerns over difficulty with self-care will improve Outcome: Progressing Goal: Ability to communicate needs accurately will  improve Outcome: Progressing   Problem: Nutrition: Goal: Risk of aspiration will decrease Outcome: Progressing Goal: Dietary intake will improve Outcome: Progressing   

## 2022-09-09 NOTE — Progress Notes (Addendum)
LATE ENTRY FROM 09/08/22 SPEECH PATHOLOGY: SPEECH/LANGUAGE EVALUATION  HPI:  Pt is a 62 y/o male presenting on 7/1 with cough and AMS. 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 Intubated 7/1-3.  MRI with "multifocal acute/subacute ischemia within the right greater than left hemispheres and left cerebellum. Largest lesions in R parietal and occipital lobes".  PMH: HIV.   09/08/22 1349  Oral Motor/Sensory Function  Overall Oral Motor/Sensory Function WFL  Cognition  Overall Cognitive Status Impaired/Different from baseline  Arousal/Alertness Awake/alert  Orientation Level Disoriented X4  Attention Sustained  Sustained Attention Impaired  Sustained Attention Impairment Verbal basic  Memory  (unable to assess)  Auditory Comprehension  Overall Auditory Comprehension Impaired  Commands X  One Step Basic Commands 0-24% accurate  Reading Comprehension  Reading Status Not tested  Expression  Primary Mode of Expression Verbal  Verbal Expression  Overall Verbal Expression Impaired  Initiation Impaired  Automatic Speech  (unable)  Motor Speech  Overall Motor Speech Other (comment) (no attempts to speak)  SLP - End of Session  Patient left in bed;with call bell/phone within reach  Nurse Communication Diet recommendation  Assessment  Clinical Impression Statement (ACUTE ONLY) Pt was seen for speech/cognitive assessment after his swallowing evaluation.  He made eye contact with examiner; followed one-step commands <25% of the time despite max multimodal cues provided. There was minimal initiation and no attempts to verbalize.  He did not respond to orientation questions (note he was oriented to self earlier today). He will need f/u SLP intervention during acute admission- D/W RN. SLP will follow.  SLP Recommendation/Assessment Patient needs continued Speech Lanaguage Pathology Services  SLP Visit Diagnosis Cognitive communication deficit [phone])  Problem List  Verbal expression;Auditory comprehension  Plan  Speech Therapy Frequency (ACUTE ONLY) min 2x/week  Duration 2 weeks  Treatment/Interventions Language facilitation;Functional tasks;Multimodal communcation approach;SLP instruction and feedback  Potential to Achieve Goals (ACUTE ONLY) Fair  SLP Recommendations  Follow Up Recommendations Other (comment) (tba)  Assistance recommended at discharge Frequent or constant Supervision/Assistance  Functional Status Assessment Patient has had a recent decline in their functional status and demonstrates the ability to make significant improvements in function in a reasonable and predictable amount of time.  Individuals Consulted  Consulted and Agree with Results and Recommendations Patient unable/family or caregive not available  SLP Evaluations  $ SLP Speech Visit 1 Visit  SLP Evaluations  $ SLP EVAL LANGUAGE/SOUND PRODUCTION 1 Procedure   Marrietta Thunder L. Samson Frederic, MA CCC/SLP Clinical Specialist - Acute Care SLP Acute Rehabilitation Services Office number [phone]

## 2022-09-09 NOTE — Progress Notes (Addendum)
STROKE TEAM PROGRESS NOTE   BRIEF HPI [name] is a 62 y.o. male with history of HIV not on antiretrovirals who originally presented with pneumonia, cough and decreased responsiveness.  He also had several seizures shortly after presentation.  CSF and blood cultures were positive for strep pneumo, and TTE demonstrated mitral valve vegetation.  MRI brain shows multiple scattered strokes, likely from septic emboli.   SIGNIFICANT HOSPITAL EVENTS 7/1 patient intubated and LP performed 7/2 CSF studies revealed bacterial meningitis and antibiotics initiated 7/3 patient extubated 7/5 TEE  INTERIM HISTORY/SUBJECTIVE Patient has been transferred out of the ICU.  He is now hemodynamically stable and is oriented to person place time and situation, although his responses are slowed.  He continues on appropriate antibiotics for meningitis. He is sitting up comfortably in bed.  His speech is nonfluent and has increased reaction time but follows commands and moves all 4 extremities well against gravity.  OBJECTIVE  CBC    Component Value Date/Time   WBC 10.6 (H) 09/08/2022 0536   RBC 3.19 (L) 09/08/2022 0536   HGB 9.3 (L) 09/08/2022 0536   HGB 14.0 06/29/2021 1010   HCT 27.9 (L) 09/08/2022 0536   HCT 40.8 06/29/2021 1010   PLT 187 09/08/2022 0536   PLT 232 06/29/2021 1010   MCV 87.5 09/08/2022 0536   MCV 95 06/29/2021 1010   MCH 29.2 09/08/2022 0536   MCHC 33.3 09/08/2022 0536   RDW 14.7 09/08/2022 0536   RDW 12.9 06/29/2021 1010   LYMPHSABS 2.0 09/05/2022 1855   LYMPHSABS 1.7 06/29/2021 1010   MONOABS 1.2 (H) 09/05/2022 1855   EOSABS 0.0 09/05/2022 1855   EOSABS 0.0 06/29/2021 1010   BASOSABS 0.0 09/05/2022 1855   BASOSABS 0.1 06/29/2021 1010    BMET    Component Value Date/Time   NA 141 09/08/2022 0536   K 3.3 (L) 09/08/2022 0536   CL 104 09/08/2022 0536   CO2 25 09/08/2022 0536   GLUCOSE 97 09/08/2022 0536   GLUCOSE 107 07/31/2008 0000   BUN 9 09/08/2022 0536    CREATININE 0.63 09/08/2022 0536   CREATININE 0.91 06/29/2021 1010   CALCIUM 7.6 (L) 09/08/2022 0536   EGFR 96 06/29/2021 1010   GFRNONAA >60 09/08/2022 0536   GFRNONAA 97 05/21/2020 1414    IMAGING past 24 hours No results found.  Vitals:   09/08/22 2331 09/09/22 0417 09/09/22 0500 09/09/22 1044  BP: 125/89 (!) 134/91  (!) 126/91  Pulse: 91 87  83  Resp: 16 16  20   Temp: 97.7 F (36.5 C) 98.6 F (37 C)  99.1 F (37.3 C)  TempSrc: Axillary   Oral  SpO2: 98% 99%  100%  Weight:   52.8 kg      PHYSICAL EXAM General:  Alert, frail middle-aged African-American male t in no acute distress Psych:  Mood and affect appropriate for situation CV: Regular rate and rhythm on monitor Respiratory:  Regular, unlabored respirations on room air GI: Abdomen soft and nontender   NEURO:  Mental Status: AA&Ox3, able to follow simple commands with slowed responses Speech/Language: speech is slow and hesitant but without dysarthria or aphasia.    Cranial Nerves:  II: PERRL. Visual fields full.  III, IV, VI: EOMI. Eyelids elevate symmetrically.  V: Sensation is intact to light touch and symmetrical to face.  VII: Face is symmetrical resting and smiling VIII: hearing intact to voice. IX, X: Phonation is normal.  XII: tongue is midline without fasciculations. Motor: 5/5 strength  to all muscle groups tested.  Tone: is normal and bulk is normal Sensation- Intact to light touch bilaterally. Extinction absent to light touch to DSS.   Coordination: FTN intact bilaterally.No drift.  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  CTA head & neck no LVO or hemodynamically significant stenosis MRI small acute and subacute infarcts in right greater than left cerebral hemispheres and  left cerebellum 2D Echo EF 60 to 65%, 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, normal left atrial size, lipomatous atrial septum without shunt TEE pending LDL NOT CALCULATED HgbA1c 6.4 VTE prophylaxis -Lovenox No antithrombotic prior to admission, now on aspirin 81 mg daily and clopidogrel 75 mg daily for 3 weeks and then aspirin alone. 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 TEE today  HIV infection Patient has history of HIV, was not on antiretrovirals at home Continue Biktarvy  Hypertension Home meds: None Stable Maintain normotension  Hyperlipidemia Home meds: None LDL NOT CALCULATED, goal < 70 Add statin if LDL greater than 70  Tobacco Abuse Patient smokes cigarettes but not every day Will discuss tobacco cessation when mental status normalizes  Substance Abuse UDS positive for THC  Will discuss cessation when mental status normalizes  Dysphagia Patient has post-stroke dysphagia, SLP consulted    Diet   Diet NPO time specified   Advance diet as tolerated  Other Stroke Risk Factors None   Other Active Problems None  Hospital day # 4  Patient seen by NP and then by MD, MD to edit note is needed Cortney E Ernestina Columbia , MSN, AGACNP-BC [facility] See Amion for schedule and pager information 09/09/2022 12:53 PM   STROKE MD NOTE :  I have personally obtained history,examined this patient, reviewed notes, independently viewed imaging studies, participated in medical decision making and plan of care.ROS completed by me personally and pertinent positives fully documented  I have  made any additions or clarifications directly to the above note. Agree with note above.  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  Recommend continue antibiotics for meningitis and presumptive endocarditis as per ID.  Check TEE to look for mitral valve conclusive details and to decide on subsequent surgical or medical treatment as appropriate.  Discussed with Dr. Stephenie Acres cardiologist and TEE to be arranged for Monday.  Greater than 50% time during this 50-minute visit were spent in counseling and coordination of care about his suspected endocarditis and embolic strokes in the discussion about evaluation and plan of care and answering questions  Delia Heady, MD Medical Director [facility] Pager: [phone] 09/09/2022 1:58 PM  To contact Stroke Continuity provider, please refer to [email or web address]. After hours, contact General Neurology

## 2022-09-09 NOTE — Assessment & Plan Note (Addendum)
Murmur was noted on exam. 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 LVEF 60-65% - f/u TEE 7/8 and cardiac MRI to further assess - Neurology agreed to discontinue DAPT, and continue patient on ASA 81 mg due to risk of hemorrhagic transformation with septic emboli

## 2022-09-09 NOTE — Progress Notes (Signed)
Physical Therapy Treatment Patient Details Name: Christopher Farrell MRN: [id number] DOB: 1960-09-14 Today's Date: 09/09/2022   History of Present Illness Pt is a 62 y/o male presenting on 7/1 with cough and AMS. Noted presented to ED on 6/26 and found with multifocal pneumonia. Returned to ED after being found unresponsive, admitted with sepsis. 2 seizures in ED. CSF reveals bacterial meningitis. MRI with "multifocal acute/subacute ischemia within the right greater than left hemispheres and left cerebellum. Largest lesions in R parietal and occipital lobes". Intubated 7/1-7/3. PMH: HIV.    PT Comments  The pt was agreeable to session, presents with significant improvement in verbalization attempts and ability to follow mobility commands this session. The pt was able to complete rolling in bed with minA-minG and increased time as well as transition to sitting EOB with minA. He was able to maintain static sitting with minG for safety, and able to progress to standing at EOB with modA. The pt was able to answer some orientation questions appropriately and follow commands with increased time. Pt with great progress this session, will benefit from +2 for initiation of gait training next session. Continue to recommend intensive rehab after d/c to progress back towards highest level of independence after d/c.     Assistance Recommended at Discharge Frequent or constant Supervision/Assistance  If plan is discharge home, recommend the following:  Can travel by private vehicle    Two people to help with walking and/or transfers;Two people to help with bathing/dressing/bathroom;Assistance with cooking/housework;Direct supervision/assist for medications management;Direct supervision/assist for financial management;Assist for transportation      Equipment Recommendations  BSC/3in1;Wheelchair (measurements PT);Wheelchair cushion (measurements PT);Rolling walker (2 wheels) (pending progress)    Recommendations  for Other Services Rehab consult     Precautions / Restrictions Precautions Precautions: Fall Precaution Comments: upon arrival flexiseal and condom cath were disconected, pt out of bilateral mitts. Restrictions Weight Bearing Restrictions: No     Mobility  Bed Mobility Overal bed mobility: Needs Assistance Bed Mobility: Rolling, Sidelying to Sit, Sit to Supine Rolling: Min assist Sidelying to sit: Min assist   Sit to supine: Mod assist   General bed mobility comments: minA to complete roll and minA to move LE to EOB and elevate trunk.    Transfers Overall transfer level: Needs assistance Equipment used: 1 person hand held assist Transfers: Sit to/from Stand, Bed to chair/wheelchair/BSC Sit to Stand: Mod assist, Min assist           General transfer comment: pt cued to hold onto therapists arms for support, able to rise to standing with min-modA. pt with posterior lean but no knee buckling.    Ambulation/Gait Ambulation/Gait assistance: Mod assist Gait Distance (Feet): 3 Feet Assistive device: 1 person hand held assist Gait Pattern/deviations: Step-to pattern Gait velocity: decreased Gait velocity interpretation: <1.31 ft/sec, indicative of household ambulator   General Gait Details: small lateral steps with modA to support. no knee buckling, pt with small lateral steps and minimal clearance    Modified Rankin (Stroke Patients Only) Modified Rankin (Stroke Patients Only) Pre-Morbid Rankin Score: No symptoms Modified Rankin: Severe disability     Balance Overall balance assessment: Needs assistance Sitting-balance support: No upper extremity supported, Bilateral upper extremity supported, Feet supported Sitting balance-Leahy Scale: Fair Sitting balance - Comments: minG for static sitting with BLE on floor Postural control: Posterior lean, Right lateral lean Standing balance support: Bilateral upper extremity supported, During functional activity Standing  balance-Leahy Scale: Poor Standing balance comment: relies on BUE and external support  Cognition Arousal/Alertness: Lethargic Behavior During Therapy: Flat affect Overall Cognitive Status: Impaired/Different from baseline Area of Impairment: Orientation, Attention, Memory, Following commands, Safety/judgement, Awareness, Problem solving                 Orientation Level: Disoriented to, Time, Situation (pt states july as month and current president correctly, did not know year. states admitted due to "pain all over") Current Attention Level: Focused Memory: Decreased recall of precautions, Decreased short-term memory Following Commands: Follows one step commands consistently, Follows one step commands with increased time Safety/Judgement: Decreased awareness of safety, Decreased awareness of deficits Awareness: Intellectual Problem Solving: Slow processing, Decreased initiation, Difficulty sequencing, Requires verbal cues, Requires tactile cues General Comments: pt not restless today, following simple cues with increased time. verbalizing with increased processing time.        Exercises      General Comments General comments (skin integrity, edema, etc.): VSS on RA. pt on 2L upon arrival with SpO2 93-96% on RA      Pertinent Vitals/Pain Pain Assessment Pain Assessment: Faces Faces Pain Scale: Hurts a little bit Pain Location: bottom with wiping, denies pain with standing Pain Descriptors / Indicators: Discomfort, Grimacing Pain Intervention(s): Limited activity within patient's tolerance, Monitored during session, Repositioned     PT Goals (current goals can now be found in the care plan section) Acute Rehab PT Goals Patient Stated Goal: did not state; family desires for pt to improve PT Goal Formulation: With patient/family Time For Goal Achievement: 09/22/22 Potential to Achieve Goals: Good Progress towards PT goals:  Progressing toward goals    Frequency    Min 4X/week      PT Plan Current plan remains appropriate       AM-PAC PT "6 Clicks" Mobility   Outcome Measure  Help needed turning from your back to your side while in a flat bed without using bedrails?: A Little Help needed moving from lying on your back to sitting on the side of a flat bed without using bedrails?: A Lot Help needed moving to and from a bed to a chair (including a wheelchair)?: A Lot Help needed standing up from a chair using your arms (e.g., wheelchair or bedside chair)?: A Lot Help needed to walk in hospital room?: Total Help needed climbing 3-5 steps with a railing? : Total 6 Click Score: 11    End of Session Equipment Utilized During Treatment: Gait belt Activity Tolerance: Patient tolerated treatment well Patient left: with call bell/phone within reach;in bed;with nursing/sitter in room (pt transferring to new floor so bed alarm not on yet) Nurse Communication: Mobility status PT Visit Diagnosis: Unsteadiness on feet (R26.81);Other abnormalities of gait and mobility (R26.89);Muscle weakness (generalized) (M62.81);Difficulty in walking, not elsewhere classified (R26.2)     Time: 1207-1223 PT Time Calculation (min) (ACUTE ONLY): 16 min  Charges:    $Therapeutic Exercise: 8-22 mins PT General Charges $$ ACUTE PT VISIT: 1 Visit                     Vickki Muff, PT, DPT   Acute Rehabilitation Department Office [phone] Secure Chat Communication Preferred   Ronnie Derby 09/09/2022, 1:35 PM

## 2022-09-09 NOTE — Progress Notes (Signed)
Inpatient Rehab Admissions Coordinator:    I met with pt. To discuss potential CIR admit. He is interested, states that he lives with his mother and she can provide 24/7 supervision at d/c. I will reach out to her to confirm this. He does not have insurance, so I will pull up a cost estimate for rehab.  Megan Salon, MS, CCC-SLP Rehab Admissions Coordinator  [phone] (celll) [phone] (office)

## 2022-09-09 NOTE — Progress Notes (Signed)
Attempted to feed pt breakfast, he signal by hand that he doesn't wawnt it. He is able to swallow most of his oral meds. Patient able to swallow a few breakfast with a lot of encouragement. Oral care provided pre and post breakfast. IV team called for PICC update. Will attempt to insert PICC if able to contact mom w/ consent. Patient repositioned. Will continue to monitor.

## 2022-09-09 NOTE — Progress Notes (Signed)
Neurology Progress Note    Subjective: Patient was extubated today. Following commands. Mother is at bedside.  Exam: Vitals:   09/08/22 2331 09/09/22 0417  BP: 125/89 (!) 134/91  Pulse: 91 87  Resp: 16 16  Temp: 97.7 F (36.5 C) 98.6 F (37 C)  SpO2: 98% 99%   Gen: patient lying in bed, NAD CV: extremities appear well-perfused Resp: normal WOB  Neurologic exam MS: alert, oriented x2, follows commands Speech: mild dysarthria, mild aphasia CN: PERRL, blinks to threat bilat, EOMI, sensation intact, face symmetric, hearing intact to voice Motor: anti-gravity at elbow BUE, wiggles toes on command Sensory: SILT Reflexes: 2+ symm with toes down bilat Coordination: no ataxia Gait: deferred   Pertinent Labs: Meningitis/encephalitis panel - streptococcus pneumoniae WBC 1045 RBC 535 Glucose less than 20 Protein greater than 600  CD4 T Cells 216 CD4% Helper T Cells 13  Component     Latest Ref Rng 09/05/2022  Total lymphocyte count     1,000 - 4,000 /uL 1,563   CD4%     33 - 65 % 13.10 (L)   CD4 absolute     400 - 1,790 /uL 205 (L)   CD8tox     12 - 40 % 72.79 (H)   CD8 T Cell Abs     190 - 1,000 /uL 1,137 (H)   Ratio     1.0 - 3.0  0.18 (L)       Imaging Reviewed: MRI Brain - Multifocal acute/subacute ischemia within the right greater than left cerebral hemispheres and left cerebellum. The largest lesions are located in the right parietal and occipital lobes. No hemorrhage or mass effect.  CT angio head neck-no LVO  LTM EEG 09/05/2022 2036 to 09/06/2022 1140: This study is suggestive of severe diffuse encephalopathy, nonspecific etiology but likely related to sedation. No seizures or epileptiform discharges were seen throughout the recording.   LTM EEG 7/2-This study is suggestive of severe diffuse encephalopathy, nonspecific etiology but likely related to sedation. No seizures or epileptiform discharges were seen throughout the recording.   CSF and blood cultures  both (+) for strep pneumo  Labs:  A1c 6.4  Impression:  40M HIV + and noncompliant with HAART initially presented obtunded, GTC seizure, leukocytosis and recent mulitfocal pneumonia and now intubated. CSF and blood cultures both (+) for strep pneumo. LTM negative for seizure activity. MRI brain shows multifocal acute ischemia, c/f septic emboli given vegetation seen on TTE.  Recommendations: - Continue abx per ID (ceftriaxone, vanc) - D/c LTM - TEE pending - LDL ordered   -Continue ASA 81mg  daily + plavix 75mg  daily x21 days f/b ASA 81mg  daily monotherapy after that - q4 hr neuro checks - STAT head CT for any change in neuro exam - Tele - PT/OT/SLP when able to participate - Stroke education - Amb referral to neurology upon discharge - order placed   Will continue to follow   Bing Neighbors, MD [facility] [phone]  If 7pm- 7am, please page neurology on call as listed in AMION.

## 2022-09-09 NOTE — Progress Notes (Signed)
Speech Language Pathology Treatment: Dysphagia;Cognitive-Linquistic  Patient Details Name: Christopher Farrell MRN: [id number] DOB: Jul 06, 1960 Today's Date: 09/09/2022 Time: 4098-1191 SLP Time Calculation (min) (ACUTE ONLY): 12 min  Assessment / Plan / Recommendation Clinical Impression  Christopher Farrell is more alert today. Verbalizing, though minimally. He answers basic questions about himself/basic needs after long delay.  He drank and fed himself a few bites of pudding but required max verbal/tactile cues to initiate action.  He had difficulty sustaining attention to self-feeding and constant cues were needed to persist through task.  He followed simple commands after delay.  There were no concerns for aspiration; oral holding still notable.  Continue dysphagia 1 diet/thin liquids for now. SLP will follow for swallowing/cognitive-communication.   HPI HPI: Pt is a 62 y/o male presenting on 7/1 with cough and AMS. 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 Intubated 7/1-3.  MRI with "multifocal acute/subacute ischemia within the right greater than left hemispheres and left cerebellum. Largest lesions in R parietal and occipital lobes".  PMH: HIV.      SLP Plan  Continue with current plan of care      Recommendations for follow up therapy are one component of a multi-disciplinary discharge planning process, led by the attending physician.  Recommendations may be updated based on patient status, additional functional criteria and insurance authorization.    Recommendations  Diet recommendations: Dysphagia 1 (puree);Thin liquid Liquids provided via: Cup;Straw Medication Administration: Crushed with puree Supervision: Staff to assist with self feeding Compensations: Minimize environmental distractions                  Oral care BID   Frequent or constant Supervision/Assistance Dysphagia, unspecified (R13.10);Cognitive communication deficit (R41.841)      Continue with current plan of care   Christopher Farrell L. Samson Frederic, MA CCC/SLP Clinical Specialist - Acute Care SLP Acute Rehabilitation Services Office number [phone]   Blenda Mounts Christopher Farrell  09/09/2022, 12:07 PM

## 2022-09-09 NOTE — Hospital Course (Addendum)
Christopher Farrell is a 62 y.o.male with a history of HIV not on antiretrovirals who was admitted to the [facility] Teaching Service at [facility] for bacterial meningitis due to Streptococcus pneumoniae. His hospital course is detailed below:  Meningitis 2/2 to S pneumo Upon admission in the ED, the patient had a 2-minute seizure which terminated on its own and required intubation for airway protection in the setting of altered mental status and copious secretions and admission to the ICU on 7/1.  He was extubated on 7/4 and transferred to the floor on 7/5. An LP was performed in the ED revealing bacterial meningitis. CSF and blood cultures were positive for S pneumo. Was placed on vancomycin and ceftriaxone; ultimately narrowed to rocephin for total 6 week course. Repeat brain MRI performed 7/16 which showed no major changes or mass effect but abnormal DWI.  Mental status improved greatly over stay and was normal at discharge.  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  MRI with multifocal acute/subacute ischemia in R>L cerebral hemispheres and L cerebellum.  CT Head confirmed no hemorrhage.  Upon reevaluation, due to the risk of hemorrhagic septal emboli, Plavix was held and patient was continued on aspirin 81 mg. After positive blood cultures, ID was consulted and the patient was put on IV vancomycin and ceftriaxone.  TEE showed large anterior mitral vegetation.  Vancomycin was discontinued and patient treated with ceftriaxone for 6 weeks inpatient with history of IVDU precluding outpatient treatment.  Coronary cath performed on 8/5 showed minimal CAD. TEE 8/7 showed severe MR without mitral vegetation.  CT surgery also consulted and recommended outpatient follow up for mitral valve surgery with need for prior dental surgery evaluation to consider tooth extraction.  Adult dental surgery was called,  but as there was no oral surgeon on staff and patient has Medicaid, he will need to follow up with outpatient Medicaid provider.  Upon discharge, patient was stable and afebrile.  He had appointment with CT surgery and PCP established.  Antibiotic course: Vancomycin (7/1-7/3) Cefepime (7/1) Ampicillin (7/1-7/2) Metronidazole (7/1) CTX (7/1-8/12)  HIV He has not been compliant with his Biktarvy over the past year. Patient was also started on Tivicay and Descovy in the ICU and switched to [facility]. HIV load on 7/31 was 40. Upon discharge patient was continued on his Bictegravir-Emtricitab-Tenofovir ART course.  Antiviral course: Dolutegravir (7/2-7/5) Emtricitabine-Tenofovir AF (7/2-7/5) Bictegravir-Emtricitab-Tenofovir (7/6-discharge)  Hypotension Hypotensive throughout hospitalization. Started on Midodrine 10 mg TID inpatient to help with this and was later reduced to midodrine 5 mg TID.  BP was soft on this dose, but he remained asymptomatic.  Other chronic conditions were medically managed with home medications and formulary alternatives as necessary.

## 2022-09-10 DIAGNOSIS — G001 Pneumococcal meningitis: Secondary | ICD-10-CM

## 2022-09-10 DIAGNOSIS — I639 Cerebral infarction, unspecified: Secondary | ICD-10-CM

## 2022-09-10 DIAGNOSIS — A419 Sepsis, unspecified organism: Secondary | ICD-10-CM

## 2022-09-10 DIAGNOSIS — J189 Pneumonia, unspecified organism: Secondary | ICD-10-CM

## 2022-09-10 LAB — BASIC METABOLIC PANEL
Anion gap: 10 (ref 5–15)
BUN: 10 mg/dL (ref 8–23)
CO2: 24 mmol/L (ref 22–32)
Calcium: 7.4 mg/dL — ABNORMAL LOW (ref 8.9–10.3)
Chloride: 97 mmol/L — ABNORMAL LOW (ref 98–111)
Creatinine, Ser: 0.65 mg/dL (ref 0.61–1.24)
GFR, Estimated: >60 mL/min (ref >60)
Glucose, Bld: 76 mg/dL (ref 70–99)
Potassium: 3.9 mmol/L (ref 3.5–5.1)
Sodium: 131 mmol/L — ABNORMAL LOW (ref 135–145)

## 2022-09-10 LAB — CULTURE, BLOOD (ROUTINE X 2)
Culture: NO GROWTH
Culture: NO GROWTH
Culture: NO GROWTH
Special Requests: ADEQUATE
Special Requests: ADEQUATE

## 2022-09-10 LAB — CBC
HCT: 27.9 % — ABNORMAL LOW (ref 39.0–52.0)
Hemoglobin: 9.9 g/dL — ABNORMAL LOW (ref 13.0–17.0)
MCH: 30.9 pg (ref 26.0–34.0)
MCHC: 35.5 g/dL (ref 30.0–36.0)
MCV: 87.2 fL (ref 80.0–100.0)
Platelets: 275 10*3/uL (ref 150–400)
RBC: 3.2 MIL/uL — ABNORMAL LOW (ref 4.22–5.81)
RDW: 14 % (ref 11.5–15.5)
WBC: 9.7 10*3/uL (ref 4.0–10.5)
nRBC: 0 % (ref 0.0–0.2)

## 2022-09-10 LAB — GLUCOSE, CAPILLARY
Glucose-Capillary: 81 mg/dL (ref 70–99)
Glucose-Capillary: 95 mg/dL (ref 70–99)
Glucose-Capillary: 116 mg/dL — ABNORMAL HIGH (ref 70–99)
Glucose-Capillary: 98 mg/dL (ref 70–99)

## 2022-09-10 LAB — LDL CHOLESTEROL, DIRECT: Direct LDL: 47 mg/dL (ref 0–99)

## 2022-09-10 NOTE — Assessment & Plan Note (Signed)
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 LVEF 60-65% - f/u TEE 7/8

## 2022-09-10 NOTE — Progress Notes (Addendum)
Daily Progress Note Intern Pager: [phone]  Patient name: Christopher Farrell Medical record number: [id number] Date of birth: [birth year] Age: 62 y.o. Gender: male  Primary Care Provider: Pcp, No Consultants: Cardiology, CCM, ID, neurology Code Status: Full code  Pt Overview and Major Events to Date:  7/1: Admission and Intubation 7/4: Extubated 7/5 Transferred to FMTS   Assessment and Plan:  Christopher Farrell is a 62 y.o. male presenting with meningitis due to Streptococcus pneumoniae. Pertinent PMH/PSH includes HIV not on antiretrovirals.   On exam he appears closer to baseline.  Will need to continue Rocephin IV.  Planning to have TEE on 7/8 to further assess atrial mass concerning for septic vegetation.    He is working with SLP and PT.  Advance to dysphagia 1 diet yesterday.  PT is recommending acute inpatient rehab.  Once we have completed medical workup, will reach out to [facility] to help coordinate placement at CIR.    Hospital Problem List      Hospital     * (Principal) Meningitis due to Streptococcus pneumoniae     Appears stable and improving.  - Continue Rocephin (6/14 days; 7/1-7/15) - Neurology consulted, appreciate recommendations - PT/OT/SLP - daily BMP        Altered mental status     Appears closer to baseline. - assess mental status daily to monitor improvement - STAT head CT for any change in neuro exam         HIV infection (HCC)     - Continue Tivicay and Descovy.        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  LVEF 60-65% - f/u TEE 7/8 and cardiac MRI to further assess        Cerebral septic emboli (HCC)     - continue antibiotics - continue ASA 81 mg       FEN/GI: Dysphagia 1 PPx: Lovenox Dispo: pending clinical improvement . Barriers include ongoing medical manage meant requiring IV antibiotics.   Subjective:  No acute events overnight, patient resting comfortably in bed this morning  with no complaints.  Objective: Temp:  [98.7 F (37.1 C)-99.1 F (37.3 C)] 98.9 F (37.2 C) (07/06 0731) Pulse Rate:  [81-95] 85 (07/06 0731) Resp:  [17-20] 18 (07/06 0731) BP: (122-131)/(88-93) 131/88 (07/06 0731) SpO2:  [97 %-100 %] 98 % (07/06 0731) Physical Exam: No acute distress Cardio: Regular rate, regular rhythm, mitral regurg murmur present. Pulm: Clear, no wheezing, no crackles. No increased work of breathing Abdominal: bowel sounds present, soft, non-tender, non-distended Extremities: no peripheral edema  Neuro: alert and oriented x3, speech normal in content, no facial asymmetry, strength intact and equal bilaterally in UE and LE, pupils equal and reactive to light.  Psych:  Cognition and judgment appear intact. Alert, communicative  and cooperative with normal attention span and concentration.   Laboratory: Most recent CBC Lab Results  Component Value Date   WBC 10.6 (H) 09/08/2022   HGB 9.3 (L) 09/08/2022   HCT 27.9 (L) 09/08/2022   MCV 87.5 09/08/2022   PLT 187 09/08/2022   Most recent BMP    Latest Ref Rng & Units 09/09/2022    1:24 PM  BMP  Glucose 70 - 99 mg/dL 99   BUN 8 - 23 mg/dL 7   Creatinine 0.86 - 5.78 mg/dL 4.69   Sodium 629 - 528 mmol/L 133   Potassium 3.5 - 5.1 mmol/L 3.7   Chloride  98 - 111 mmol/L 100   CO2 22 - 32 mmol/L 24   Calcium 8.9 - 10.3 mg/dL 7.6    Glendale Chard, DO 09/10/2022, 8:50 AM  PGY-2, [facility] FPTS Intern pager: [phone], text pages welcome Secure chat group [facility] Teaching Service

## 2022-09-10 NOTE — Plan of Care (Signed)

## 2022-09-10 NOTE — Assessment & Plan Note (Signed)
Continue Tivicay and Descovy °

## 2022-09-10 NOTE — Assessment & Plan Note (Signed)
Appears closer to baseline. - assess mental status daily to monitor improvement - STAT head CT for any change in neuro exam

## 2022-09-10 NOTE — Assessment & Plan Note (Addendum)
Stable, mental status appears to be at baseline - Continue Rocephin (7/14 days; 7/1-7/15) - Neurology consulted, appreciate recommendations - PT/OT/SLP - monitor BMP

## 2022-09-10 NOTE — Progress Notes (Incomplete)
    Date of service*** Daily Progress Note Intern Pager: [phone]  Patient name: Christopher Farrell Medical record number: [id number] Date of birth: 1960/03/22 Age: 62 y.o. Gender: male  Primary Care Provider: Pcp, No Consultants: Cardiology, CCM, ID, neurology Code Status: Full code  Pt Overview and Major Events to Date:  7/1: Admission and Intubation 7/4: Extubated 7/5 Transferred to FMTS   Assessment and Plan:  Christopher Farrell is a 62 y.o. male presenting with meningitis due to Streptococcus pneumoniae. Also noted to have atrial mass on echo concerning for septic vegetation Pertinent PMH/PSH includes HIV not on antiretrovirals.   Hospital Problem List      Hospital     * (Principal) Meningitis due to Streptococcus pneumoniae     Stable, mental status appears to be at baseline - Continue Rocephin (7/14 days; 7/1-7/15) - Neurology consulted, appreciate recommendations - PT/OT/SLP - monitor BMP        Altered mental status     Appears closer to baseline. - assess mental status daily to monitor improvement - STAT head CT for any change in neuro exam         HIV infection (HCC)     - Continue Tivicay and Descovy.        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  LVEF 60-65% - f/u TEE 7/8         Cerebral septic emboli (HCC)     - continue antibiotics - continue ASA 81 mg        FEN/GI: DYS 1 PPx: Lovenox Dispo:CIR pending continued management - IV abx and TEE as above  Subjective:  ***  Objective: Temp:  [97.5 F (36.4 C)-99.2 F (37.3 C)] 99.2 F (37.3 C) (07/06 1934) Pulse Rate:  [85-95] 92 (07/06 1934) Resp:  [17-18] 18 (07/06 1934) BP: (115-131)/(84-91) 122/84 (07/06 1934) SpO2:  [97 %-100 %] 98 % (07/06 1934) Physical Exam: General: *** Cardiovascular: *** Respiratory: *** Abdomen: *** Extremities: ***  Laboratory: Most recent CBC Lab Results  Component Value Date   WBC 9.7 09/10/2022    HGB 9.9 (L) 09/10/2022   HCT 27.9 (L) 09/10/2022   MCV 87.2 09/10/2022   PLT 275 09/10/2022   Most recent BMP    Latest Ref Rng & Units 09/10/2022    9:30 AM  BMP  Glucose 70 - 99 mg/dL 76   BUN 8 - 23 mg/dL 10   Creatinine 1.47 - 1.24 mg/dL 8.29   Sodium 562 - 130 mmol/L 131   Potassium 3.5 - 5.1 mmol/L 3.9   Chloride 98 - 111 mmol/L 97   CO2 22 - 32 mmol/L 24   Calcium 8.9 - 10.3 mg/dL 7.4     Vonna Drafts, MD 09/10/2022, 11:21 PM  PGY-2, [facility] FPTS Intern pager: [phone], text pages welcome Secure chat group [facility] Teaching Service

## 2022-09-10 NOTE — Progress Notes (Signed)
STROKE TEAM PROGRESS NOTE   SUBJECTIVE (INTERVAL HISTORY) His RN is at the bedside.  Overall his condition is stable.  Patient not fully orientated, mild psychomotor slowing, no significant focal neurodeficit.  Per RN, patient did not eat well, encourage patient to increase p.o. intake.   OBJECTIVE Temp:  [97.5 F (36.4 C)-99.2 F (37.3 C)] 99.2 F (37.3 C) (07/06 1934) Pulse Rate:  [85-95] 92 (07/06 1934) Cardiac Rhythm: Normal sinus rhythm (07/06 1900) Resp:  [17-18] 18 (07/06 1934) BP: (115-131)/(84-91) 122/84 (07/06 1934) SpO2:  [97 %-100 %] 98 % (07/06 1934)  Recent Labs  Lab 09/09/22 2123 09/10/22 0613 09/10/22 1135 09/10/22 1636 09/10/22 2113  GLUCAP 84 81 95 116* 98   Recent Labs  Lab 09/06/22 0512 09/06/22 1642 09/07/22 0700 09/07/22 1634 09/08/22 0536 09/09/22 1324 09/10/22 0930  NA 134*  --  138 138 141 133* 131*  K 3.4*  --  6.1* 4.0 3.3* 3.7 3.9  CL 106  --  112* 107 104 100 97*  CO2 19*  --  19* 23 25 24 24   GLUCOSE 98  --  100* 122* 97 99 76  BUN 11  --  14 10 9  7* 10  CREATININE 0.63  --  0.65 0.59* 0.63 0.54* 0.65  CALCIUM 7.3*  --  7.5* 7.7* 7.6* 7.6* 7.4*  MG 2.1 2.1 2.4 2.2 2.0  --   --   PHOS 3.3 2.7 2.9 1.8* 1.7* 3.4  --    Recent Labs  Lab 09/05/22 1113 09/05/22 1855  AST 78* 94*  ALT 31 36  ALKPHOS 52 42  BILITOT 1.0 0.8  PROT 7.3 5.9*  ALBUMIN 2.2* 1.8*   Recent Labs  Lab 09/05/22 1113 09/05/22 1226 09/05/22 1855 09/05/22 1925 09/06/22 1000 09/08/22 0536 09/10/22 0930  WBC 19.5*  --  17.3*  --  14.4* 10.6* 9.7  NEUTROABS 16.0*  --  13.8*  --   --   --   --   HGB 12.1*   < > 10.1* 10.9* 9.6* 9.3* 9.9*  HCT 34.9*   < > 29.1* 32.0* 27.7* 27.9* 27.9*  MCV 87.7  --  84.6  --  89.4 87.5 87.2  PLT 224  --  170  --  137* 187 275   < > = values in this interval not displayed.   No results for input(s): "CKTOTAL", "CKMB", "CKMBINDEX", "TROPONINI" in the last 168 hours. No results for input(s): "LABPROT", "INR" in the last 72  hours. No results for input(s): "COLORURINE", "LABSPEC", "PHURINE", "GLUCOSEU", "HGBUR", "BILIRUBINUR", "KETONESUR", "PROTEINUR", "UROBILINOGEN", "NITRITE", "LEUKOCYTESUR" in the last 72 hours.  Invalid input(s): "APPERANCEUR"     Component Value Date/Time   CHOL 58 09/07/2022 0927   TRIG 122 09/07/2022 0927   TRIG 11 02/14/2008 0000   HDL <10 (L) 09/07/2022 0927   CHOLHDL NOT CALCULATED 09/07/2022 0927   VLDL 24 09/07/2022 0927   LDLCALC NOT CALCULATED 09/07/2022 0927   LDLCALC 62 06/29/2021 1010   Lab Results  Component Value Date   HGBA1C 6.4 (H) 09/06/2022      Component Value Date/Time   LABOPIA NONE DETECTED 09/06/2022 0655   COCAINSCRNUR NONE DETECTED 09/06/2022 0655   LABBENZ NONE DETECTED 09/06/2022 0655   AMPHETMU NONE DETECTED 09/06/2022 0655   THCU POSITIVE (A) 09/06/2022 0655   LABBARB NONE DETECTED 09/06/2022 0655    No results for input(s): "ETH" in the last 168 hours.  I have personally reviewed the radiological images below and agree with the radiology  interpretations.  [place] EKG SITE RITE  Result Date: 09/08/2022 If Site Rite image not attached, placement could not be confirmed due to current cardiac rhythm.  DG Abd Portable 1V  Result Date: 09/07/2022 CLINICAL DATA:  Feeding tube placement. EXAM: PORTABLE ABDOMEN - 1 VIEW COMPARISON:  Abdominal CT 09/05/2022. FINDINGS: 0912 hours. Feeding tube tip projects over the L4 vertebral body, likely in the mid to distal stomach. The bowel gas pattern appears nonobstructive. Mild atelectasis at both lung bases. Telemetry leads overlie the abdomen and lower chest. IMPRESSION: Feeding tube tip projects over the mid to distal stomach. Electronically Signed   By: Carey Bullocks M.D.   On: 09/07/2022 09:20   CT ANGIO HEAD NECK W WO CM  Result Date: 09/07/2022 CLINICAL DATA:  Neuro deficit with acute stroke suspected EXAM: CT ANGIOGRAPHY HEAD AND NECK WITH AND WITHOUT CONTRAST TECHNIQUE: Multidetector CT imaging of the head  and neck was performed using the standard protocol during bolus administration of intravenous contrast. Multiplanar CT image reconstructions and MIPs were obtained to evaluate the vascular anatomy. Carotid stenosis measurements (when applicable) are obtained utilizing NASCET criteria, using the distal internal carotid diameter as the denominator. RADIATION DOSE REDUCTION: This exam was performed according to the departmental dose-optimization program which includes automated exposure control, adjustment of the mA and/or kV according to patient size and/or use of iterative reconstruction technique. 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 There is extensive artifact from EEG leads, no evidence of hemorrhage or progression. No hydrocephalus or shift Vascular: No hyperdense vessel or unexpected calcification. Skull: Normal. Negative for fracture or focal lesion. Sinuses/Orbits: No acute finding. Review of the MIP images confirms the above findings CTA NECK FINDINGS Aortic arch: Atheromatous calcification of the aorta with 3 vessel branching. Right carotid system: Vessels are smoothly contoured and widely patent. Left carotid system: ICA tortuosity. Vessels are smoothly contoured and widely patent. Vertebral arteries: Proximal subclavian atherosclerosis. The vertebral arteries are mildly tortuous. No stenosis, beading, or dissection. Skeleton: Ordinary cervical spine degeneration. Other neck: No acute or aggressive finding Upper chest: Clear apical lungs when accounting for artifact Review of the MIP images confirms the above findings CTA HEAD FINDINGS Anterior circulation: No significant stenosis, proximal occlusion, aneurysm, or vascular malformation. Posterior circulation: No significant stenosis, proximal occlusion, aneurysm, or vascular  malformation. Venous sinuses: Diffusely patent Anatomic variants: None significant Review of the MIP images confirms the above findings IMPRESSION: No emergent vascular finding. No flow limiting stenosis or ulceration of major arteries in the head and neck. Electronically Signed   By: Tiburcio Pea M.D.   On: 09/07/2022 04:10   ECHOCARDIOGRAM COMPLETE  Result Date: 09/06/2022    ECHOCARDIOGRAM REPORT   Patient Name:   Christopher Farrell Date of Exam: 09/06/2022 Medical Rec #:  [id number]          Height:       64.0 in Accession #:    [id number]         Weight:       115.1 lb Date of Birth:  December 01, 1960          BSA:          1.547 m Patient Age:    61 years           BP:  92/61 mmHg Patient Gender: M                  HR:           63 bpm. Exam Location:  Inpatient Procedure: 2D Echo, Cardiac Doppler and Color Doppler Indications:    Other cardiac sounds R01.2  History:        Patient has no prior history of Echocardiogram examinations.                 Signs/Symptoms:Shortness of Breath; Risk Factors:Current Smoker.  Sonographer:    Lucendia Herrlich Referring Phys: [id number] LUKE SEMINARA IMPRESSIONS  1. Left ventricular ejection fraction, by estimation, is 60 to 65%. The left ventricle has normal function. The left ventricle has no regional wall motion abnormalities. Left ventricular diastolic parameters were normal.  2. Right ventricular systolic function is normal. The right ventricular size is normal.  3. There is a solid appearing mass on the atrial side of the mid/distal anterior leaflet Maximum diameter 1.5 cm Consider f/u TEE and cardiac MRI to further assess. The mitral valve is abnormal. Trivial mitral valve regurgitation. No evidence of mitral stenosis.  4. The aortic valve is tricuspid. There is mild calcification of the aortic valve. There is mild thickening of the aortic valve. Aortic valve regurgitation is not visualized. Aortic valve sclerosis is present, with no evidence of aortic valve  stenosis.  5. The inferior vena cava is dilated in size with >50% respiratory variability, suggesting right atrial pressure of 8 mmHg. FINDINGS  Left Ventricle: Left ventricular ejection fraction, by estimation, is 60 to 65%. The left ventricle has normal function. The left ventricle has no regional wall motion abnormalities. The left ventricular internal cavity size was normal in size. There is  no left ventricular hypertrophy. Left ventricular diastolic parameters were normal. Right Ventricle: The right ventricular size is normal. No increase in right ventricular wall thickness. Right ventricular systolic function is normal. Left Atrium: Left atrial size was normal in size. Right Atrium: Right atrial size was normal in size. Pericardium: There is no evidence of pericardial effusion. Mitral Valve: There is a solid appearing mass on the atrial side of the mid/distal anterior leaflet Maximum diameter 1.5 cm Consider f/u TEE and cardiac MRI to further assess. The mitral valve is abnormal. There is mild thickening of the mitral valve leaflet(s). There is mild calcification of the mitral valve leaflet(s). Trivial mitral valve regurgitation. No evidence of mitral valve stenosis. Tricuspid Valve: The tricuspid valve is normal in structure. Tricuspid valve regurgitation is not demonstrated. No evidence of tricuspid stenosis. Aortic Valve: The aortic valve is tricuspid. There is mild calcification of the aortic valve. There is mild thickening of the aortic valve. Aortic valve regurgitation is not visualized. Aortic valve sclerosis is present, with no evidence of aortic valve stenosis. Aortic valve peak gradient measures 5.7 mmHg. Pulmonic Valve: The pulmonic valve was normal in structure. Pulmonic valve regurgitation is not visualized. No evidence of pulmonic stenosis. Aorta: The aortic root is normal in size and structure. Venous: The inferior vena cava is dilated in size with greater than 50% respiratory variability,  suggesting right atrial pressure of 8 mmHg. IAS/Shunts: The interatrial septum appears to be lipomatous. No atrial level shunt detected by color flow Doppler.  LEFT VENTRICLE PLAX 2D LVIDd:         2.32 cm   Diastology LVIDs:         2.36 cm   LV e' medial:    11.80  cm/s LV PW:         0.89 cm   LV E/e' medial:  8.2 LV IVS:        0.42 cm   LV e' lateral:   12.70 cm/s LVOT diam:     2.00 cm   LV E/e' lateral: 7.6 LV SV:         58 LV SV Index:   37 LVOT Area:     3.14 cm  RIGHT VENTRICLE            IVC RV S prime:     8.24 cm/s  IVC diam: 2.20 cm TAPSE (M-mode): 2.2 cm LEFT ATRIUM             Index        RIGHT ATRIUM           Index LA diam:        3.60 cm 2.33 cm/m   RA Area:     12.80 cm LA Vol (A2C):   38.8 ml 25.08 ml/m  RA Volume:   26.40 ml  17.07 ml/m LA Vol (A4C):   40.1 ml 25.92 ml/m LA Biplane Vol: 39.1 ml 25.28 ml/m  AORTIC VALVE AV Area (Vmax): 2.64 cm AV Vmax:        119.00 cm/s AV Peak Grad:   5.7 mmHg LVOT Vmax:      100.00 cm/s LVOT Vmean:     61.100 cm/s LVOT VTI:       0.184 m  AORTA Ao Root diam: 3.00 cm Ao Asc diam:  3.10 cm MITRAL VALVE               TRICUSPID VALVE MV Area (PHT): 4.68 cm    TR Peak grad:   18.3 mmHg MV Decel Time: 162 msec    TR Vmax:        214.00 cm/s MV E velocity: 97.10 cm/s MV A velocity: 75.70 cm/s  SHUNTS MV E/A ratio:  1.28        Systemic VTI:  0.18 m                            Systemic Diam: 2.00 cm Charlton Haws MD Electronically signed by Charlton Haws MD Signature Date/Time: 09/06/2022/12:57:44 PM    Final    Overnight EEG with video  Result Date: 09/06/2022 Charlsie Quest, MD     09/07/2022 10:54 AM Patient Name: Christopher Farrell MRN: [id number] Epilepsy Attending: Charlsie Quest Referring Physician/Provider: [name], MD Duration: 09/05/2022 2036 to 09/06/2022 2036 Patient history: 62yo M with seizure getting eeg to evaluate for seizure. Level of alertness: comatose AEDs during EEG study: Propofol Technical aspects: This EEG study was done with  scalp electrodes positioned according to the 10-20 International system of electrode placement. Electrical activity was reviewed with band pass filter of 1-70Hz , sensitivity of 7 uV/mm, display speed of 59mm/sec with a 60Hz  notched filter applied as appropriate. EEG data were recorded continuously and digitally stored.  Video monitoring was available and reviewed as appropriate. Description: EEG showed continuous generalized 3 to 7 Hz theta-delta slowing admixed with intermittent generalized 13-15hz  beta activity. Hyperventilation and photic stimulation were not performed.   ABNORMALITY - Continuous slow, generalized IMPRESSION: This study is suggestive of severe diffuse encephalopathy, nonspecific etiology but likely related to sedation. No seizures or epileptiform discharges were seen throughout the recording. Charlsie Quest   MR BRAIN W WO CONTRAST  Result  Date: 09/06/2022 CLINICAL DATA:  Meningitis/CNS infection suspected. EXAM: MRI HEAD WITHOUT AND WITH CONTRAST TECHNIQUE: Multiplanar, multiecho pulse sequences of the brain and surrounding structures were obtained without and with intravenous contrast. CONTRAST:  5mL GADAVIST GADOBUTROL 1 MMOL/ML IV SOLN COMPARISON:  None Available. FINDINGS: Brain: Multifocal abnormal diffusion restriction within the right greater than left hemispheres. The largest lesions are located in the right parietal and occipital lobes. There are punctate foci of acute ischemia within the contralateral hemisphere and left cerebellum. No acute or chronic hemorrhage. There is multifocal hyperintense T2-weighted signal within the white matter. Parenchymal volume and CSF spaces are normal. The midline structures are normal. There is no abnormal contrast enhancement. Vascular: Major flow voids are preserved. Skull and upper cervical spine: Normal calvarium and skull base. Visualized upper cervical spine and soft tissues are normal. Sinuses/Orbits:No paranasal sinus fluid levels or  advanced mucosal thickening. No mastoid or middle ear effusion. Normal orbits. IMPRESSION: Multifocal acute/subacute ischemia within the right greater than left cerebral hemispheres and left cerebellum. The largest lesions are located in the right parietal and occipital lobes. No hemorrhage or mass effect. Electronically Signed   By: Deatra Robinson M.D.   On: 09/06/2022 01:57   DG CHEST PORT 1 VIEW  Result Date: 09/05/2022 CLINICAL DATA:  OG tube placement EXAM: PORTABLE CHEST 1 VIEW COMPARISON:  Chest radiograph dated 09/05/2022 FINDINGS: Upper lungs are excluded from the field of view. Enteric tube terminates in the proximal gastric body. IMPRESSION: Enteric tube terminates in the proximal gastric body. Electronically Signed   By: Charline Bills M.D.   On: 09/05/2022 22:05   DG Chest Port 1 View  Result Date: 09/05/2022 CLINICAL DATA:  ETT placement EXAM: PORTABLE CHEST 1 VIEW COMPARISON:  09/05/2022 at 1149 hours FINDINGS: Endotracheal tube terminates 6.5 cm above the carina. Mild patchy right upper lobe and right lower lobe pneumonia. Left lung is clear. No pleural effusion or pneumothorax. The heart is normal in size. IMPRESSION: Endotracheal tube terminates 6.5 cm above the carina. Mild patchy right lung pneumonia, as above. Electronically Signed   By: Charline Bills M.D.   On: 09/05/2022 19:21   CT ABDOMEN PELVIS W CONTRAST  Result Date: 09/05/2022 CLINICAL DATA:  Abdominal pain, unresponsive EXAM: CT ABDOMEN AND PELVIS WITH CONTRAST TECHNIQUE: Multidetector CT imaging of the abdomen and pelvis was performed using the standard protocol following bolus administration of intravenous contrast. RADIATION DOSE REDUCTION: This exam was performed according to the departmental dose-optimization program which includes automated exposure control, adjustment of the mA and/or kV according to patient size and/or use of iterative reconstruction technique. CONTRAST:  75mL OMNIPAQUE IOHEXOL 350 MG/ML SOLN  COMPARISON:  None Available. FINDINGS: Examination is limited by breath motion artifact throughout. Lower chest: Please see separately reported examination of the chest Hepatobiliary: No solid liver abnormality is seen. No gallstones, gallbladder wall thickening, or biliary dilatation. Pancreas: Unremarkable. No pancreatic ductal dilatation or surrounding inflammatory changes. Spleen: Normal in size without significant abnormality. Adrenals/Urinary Tract: Adrenal glands are unremarkable. Kidneys are normal, without renal calculi, solid lesion, or hydronephrosis. Bladder is unremarkable. Stomach/Bowel: Stomach is within normal limits. Appendix appears normal. No evidence of bowel wall thickening, distention, or inflammatory changes. Colon is fluid-filled to the rectum. Vascular/Lymphatic: Aortic atherosclerosis. No enlarged abdominal or pelvic lymph nodes. Reproductive: No mass or other significant abnormality. Other: No abdominal wall hernia or abnormality. No ascites. Musculoskeletal: No acute or significant osseous findings. IMPRESSION: 1. No acute CT findings of the abdomen or pelvis to explain  abdominal pain. 2. Colon is fluid-filled to the rectum, suggestive of diarrheal illness. Aortic Atherosclerosis (ICD10-I70.0). Electronically Signed   By: Jearld Lesch M.D.   On: 09/05/2022 15:28   CT Angio Chest PE W/Cm &/Or Wo Cm  Result Date: 09/05/2022 CLINICAL DATA:  PE suspected, pneumonia EXAM: CT ANGIOGRAPHY CHEST WITH CONTRAST TECHNIQUE: Multidetector CT imaging of the chest was performed using the standard protocol during bolus administration of intravenous contrast. Multiplanar CT image reconstructions and MIPs were obtained to evaluate the vascular anatomy. RADIATION DOSE REDUCTION: This exam was performed according to the departmental dose-optimization program which includes automated exposure control, adjustment of the mA and/or kV according to patient size and/or use of iterative reconstruction  technique. CONTRAST:  75mL OMNIPAQUE IOHEXOL 350 MG/ML SOLN COMPARISON:  None Available. FINDINGS: Cardiovascular: Satisfactory opacification of the pulmonary arteries to the segmental level. No evidence of pulmonary embolism. Normal heart size. Left coronary artery calcifications. No pericardial effusion. Aortic atherosclerosis. Mediastinum/Nodes: No enlarged mediastinal, hilar, or axillary lymph nodes. Thyroid gland, trachea, and esophagus demonstrate no significant findings. Lungs/Pleura: Centrilobular and paraseptal emphysema. Diffuse bilateral bronchial wall thickening. Extensive bronchiolar plugging. Dense, heterogeneous nodular and consolidative airspace opacity throughout the right lung, particularly in the inferior right upper lobe (series 4, image 62), although also seen in the lateral segment right middle lobe (series 4, image 85). Lungs are clear. No pleural effusion or pneumothorax. Upper Abdomen: Please see separately reported examination of the abdomen. Musculoskeletal: No chest wall abnormality. No acute osseous findings. Review of the MIP images confirms the above findings. IMPRESSION: 1. Negative examination for pulmonary embolism. 2. Dense, heterogeneous nodular and consolidative airspace opacity throughout the right lung, particularly in the inferior right upper lobe, although also seen in the lateral segment right middle lobe. Findings are consistent with multifocal infection. 3. Diffuse bilateral bronchial wall thickening and extensive bronchiolar plugging, consistent with nonspecific infectious or inflammatory bronchitis. 4. Emphysema. 5. Coronary artery disease. Aortic Atherosclerosis (ICD10-I70.0) and Emphysema (ICD10-J43.9). Electronically Signed   By: Jearld Lesch M.D.   On: 09/05/2022 15:22   CT HEAD WO CONTRAST ( )  Result Date: 09/05/2022 CLINICAL DATA:  Mental status change, unknown cause EXAM: CT HEAD WITHOUT CONTRAST TECHNIQUE: Contiguous axial images were obtained from the  base of the skull through the vertex without intravenous contrast. RADIATION DOSE REDUCTION: This exam was performed according to the departmental dose-optimization program which includes automated exposure control, adjustment of the mA and/or kV according to patient size and/or use of iterative reconstruction technique. COMPARISON:  CT Head 03/26/13 FINDINGS: Brain: No evidence of acute infarction, hemorrhage, hydrocephalus, extra-axial collection or mass lesion/mass effect. Sequela of moderate chronic microvascular ischemic change. Vascular: No hyperdense vessel or unexpected calcification. Skull: Normal. Negative for fracture or focal lesion. Sinuses/Orbits: No middle ear or mastoid effusion. Chronic bilateral lamina papyracea fractures. Orbits are unremarkable. Other: None. IMPRESSION: No acute intracranial abnormality. Electronically Signed   By: Lorenza Cambridge M.D.   On: 09/05/2022 15:22   DG Chest Port 1 View  Result Date: 09/05/2022 CLINICAL DATA:  Sepsis EXAM: PORTABLE CHEST 1 VIEW COMPARISON:  X-ray 08/31/2022 FINDINGS: Hyperinflation. No pneumothorax, effusion or edema. Normal cardiopericardial silhouette. Overlapping cardiac leads. There are some subtle opacity in the right midlung, inferior aspect of the right upper lobe. IMPRESSION: Hyperinflation. Persistent subtle right midlung opacity. Recommend follow-up Electronically Signed   By: Karen Kays M.D.   On: 09/05/2022 12:40   DG Chest 2 View  Result Date: 08/31/2022 CLINICAL DATA:  Chest radiograph dated August 13, 2022 EXAM: CHEST - 2 VIEW COMPARISON:  Chest radiograph dated August 13, 2022 FINDINGS: The heart size and mediastinal contours are within normal limits. Interval improvement of the right upper and middle lobe lung opacities. Persistent atelectasis of the right middle lobe with mild remaining hazy opacities of the right upper and middle lobe suggesting resolving pneumonia. No pleural effusion. The visualized skeletal structures are  unremarkable. IMPRESSION: Interval improvement of the right upper and middle lobe lung opacities. Persistent atelectasis of the right middle lobe with mild remaining hazy opacities of the right upper and middle lobe suggesting resolving pneumonia. Electronically Signed   By: Larose Hires D.O.   On: 08/31/2022 14:32   DG Chest 2 View  Result Date: 08/13/2022 CLINICAL DATA:  Chest pain EXAM: CHEST - 2 VIEW COMPARISON:  Previous studies including the examination of 12/07/2019 FINDINGS: Cardiac size is within normal limits. New patchy moderate to large infiltrates in right upper lobe and right middle lobe suggesting multifocal pneumonia. Rest of the lung fields are clear. There is no pleural effusion or pneumothorax. IMPRESSION: New patchy alveolar infiltrates are seen in right upper lobe and right middle lobe suggesting multifocal pneumonia. Electronically Signed   By: Ernie Avena M.D.   On: 08/13/2022 14:59     PHYSICAL EXAM  Temp:  [97.5 F (36.4 C)-99.2 F (37.3 C)] 99.2 F (37.3 C) (07/06 1934) Pulse Rate:  [85-95] 92 (07/06 1934) Resp:  [17-18] 18 (07/06 1934) BP: (115-131)/(84-91) 122/84 (07/06 1934) SpO2:  [97 %-100 %] 98 % (07/06 1934)  General - Well nourished, well developed, in no apparent distress.  Ophthalmologic - fundi not visualized due to noncooperation.  Cardiovascular - Regular rhythm and rate.  Mental Status -  Level of arousal and orientation to month, place, and person were intact, but not orientated to year Language including expression, naming, repetition, comprehension was assessed and found intact.  Mild psychomotor slowing Fund of Knowledge was assessed and was intact.  Cranial Nerves II - XII - II - Visual field intact OU. III, IV, VI - Extraocular movements intact. V - Facial sensation intact bilaterally. VII - Facial movement intact bilaterally. VIII - Hearing & vestibular intact bilaterally. X - Palate elevates symmetrically. XI - Chin turning &  shoulder shrug intact bilaterally. XII - Tongue protrusion intact.  Motor Strength - The patient's strength was symmetrical in all extremities and pronator drift was absent.  Bulk was normal and fasciculations were absent.   Motor Tone - Muscle tone was assessed at the neck and appendages and was normal.  Reflexes - The patient's reflexes were symmetrical in all extremities and he had no pathological reflexes.  Sensory - Light touch, temperature/pinprick were assessed and were symmetrical.    Coordination - The patient had normal movements in the hands with no ataxia or dysmetria.  Tremor was absent.  Gait and Station - deferred.   ASSESSMENT/PLAN [name] is a 62 y.o. male with history of HIV not on antiretrovirals who originally presented with pneumonia, cough and decreased responsiveness.  He also had several seizures shortly after presentation.  CSF and blood cultures were positive for strep pneumo, and TTE demonstrated mitral valve vegetation.  MRI brain shows multiple scattered strokes, likely from septic emboli.   Stroke - embolic shower, etiology likely cardioembolic in the setting of endocarditis CT head No acute abnormality.  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 TEE pending on monday LDL 47 HgbA1c 6.4 VTE prophylaxis -Lovenox No antithrombotic prior to admission, now on no antithrombotics given endocarditis Therapy recommendations:  CIR Disposition: Pending   Seizure activity Patient had seizure activity upon arrival to the hospital in the setting of meningitis,  LTM showed no further seizure activity No AED now given provoked seizure Seizure procaution No driving until seizure-free for 6 months   Bacterial meningitis and strep pneumo  bacteremia Pulmonary septic emboli Endocarditis Leukocytosis WBC 14.4->10.6 CSF and blood cultures positive for strep pneumo 2D Echo 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 TEE pending on Monday Continue Rocephin   HIV infection Patient has history of HIV Not compliant with antiretrovirals at home Currently on Biktarvy Follow-up with ID as outpatient   Hypertension Home meds: None Stable Maintain normotension   Hyperlipidemia Home meds: None LDL 47, goal < 70 No statin needed given LDL at goal   Tobacco Abuse Patient smokes cigarettes but not every day tobacco cessation education provided Patient is waiting to create   Substance Abuse UDS positive for THC  cessation education provided Patient is willing to create   Dysphagia Patient has post-stroke dysphagia Not on dysphagia 1 and sleep. Increase p.o. intake Speech on board   Other Stroke Risk Factors     Other Active Problems   Hospital day # 5   Marvel Plan, MD PhD Stroke Neurology 09/10/2022 9:28 PM    To contact Stroke Continuity provider, please refer to [email or web address]. After hours, contact General Neurology

## 2022-09-10 NOTE — Assessment & Plan Note (Signed)
-   continue antibiotics - continue ASA 81 mg

## 2022-09-10 NOTE — Plan of Care (Signed)
  Problem: Metabolic: Goal: Ability to maintain appropriate glucose levels will improve Outcome: Progressing   Problem: Skin Integrity: Goal: Risk for impaired skin integrity will decrease Outcome: Progressing   Problem: Education: Goal: Knowledge of General Education information will improve Description: Including pain rating scale, medication(s)/side effects and non-pharmacologic comfort measures Outcome: Progressing   Problem: Clinical Measurements: Goal: Respiratory complications will improve Outcome: Progressing Goal: Cardiovascular complication will be avoided Outcome: Progressing

## 2022-09-11 DIAGNOSIS — I639 Cerebral infarction, unspecified: Secondary | ICD-10-CM

## 2022-09-11 DIAGNOSIS — B20 Human immunodeficiency virus [HIV] disease: Secondary | ICD-10-CM | POA: Insufficient documentation

## 2022-09-11 DIAGNOSIS — I330 Acute and subacute infective endocarditis: Secondary | ICD-10-CM

## 2022-09-11 LAB — CULTURE, BLOOD (ROUTINE X 2)
Culture: NO GROWTH
Special Requests: ADEQUATE

## 2022-09-11 LAB — GLUCOSE, CAPILLARY
Glucose-Capillary: 92 mg/dL (ref 70–99)
Glucose-Capillary: 125 mg/dL — ABNORMAL HIGH (ref 70–99)
Glucose-Capillary: 133 mg/dL — ABNORMAL HIGH (ref 70–99)
Glucose-Capillary: 102 mg/dL — ABNORMAL HIGH (ref 70–99)

## 2022-09-11 MED ORDER — BICTEGRAVIR-EMTRICITAB-TENOFOV 50-200-25 MG PO TABS
1.0000 | ORAL_TABLET | Freq: Every day | ORAL | Status: DC
  Filled 2022-09-11: qty 1

## 2022-09-11 MED ORDER — BICTEGRAVIR-EMTRICITAB-TENOFOV 50-200-25 MG PO TABS
1.0000 | ORAL_TABLET | Freq: Every day | ORAL | Status: DC
  Administered 2022-09-13 – 2022-10-17 (×35): 1 via ORAL
  Filled 2022-09-11 (×36): qty 1

## 2022-09-11 MED ORDER — ENSURE ENLIVE PO LIQD
237.0000 mL | Freq: Three times a day (TID) | ORAL | Status: DC
  Administered 2022-09-11 – 2022-10-17 (×86): 237 mL via ORAL
  Filled 2022-09-11 (×4): qty 237

## 2022-09-11 NOTE — H&P (View-Only) (Signed)
      Subjective: No new complaints   Antibiotics:  Anti-infectives (From admission, onward)    Start     Dose/Rate Route Frequency Ordered Stop   09/10/22 1000  bictegravir-emtricitabine-tenofovir AF (BIKTARVY) 50-200-25 MG per tablet 1 tablet        1 tablet Oral Daily 09/09/22 1123     09/08/22 1003  dolutegravir (TIVICAY) tablet 50 mg  Status:  Discontinued        50 mg Oral Daily 09/08/22 1003 09/09/22 1123   09/06/22 1600  vancomycin (VANCOREADY) IVPB 750 mg/150 mL  Status:  Discontinued        750 mg 150 mL/hr over 60 Minutes Intravenous Every 12 hours 09/06/22 1502 09/08/22 0915   09/06/22 1600  dolutegravir (TIVICAY) tablet 50 mg  Status:  Discontinued        50 mg Per Tube Daily 09/06/22 1502 09/07/22 0648   09/06/22 1600  emtricitabine-tenofovir AF (DESCOVY) 200-25 MG per tablet 1 tablet  Status:  Discontinued        1 tablet Per Tube Daily 09/06/22 1502 09/07/22 0648   09/06/22 1545  dolutegravir (TIVICAY) tablet 50 mg  Status:  Discontinued        50 mg Per Tube Daily 09/06/22 1455 09/08/22 1003   09/06/22 1545  emtricitabine-tenofovir AF (DESCOVY) 200-25 MG per tablet 1 tablet  Status:  Discontinued        1 tablet Per Tube Daily 09/06/22 1455 09/09/22 1123   09/06/22 1400  vancomycin (VANCOREADY) IVPB 750 mg/150 mL  Status:  Discontinued        750 mg 150 mL/hr over 60 Minutes Intravenous Every 24 hours 09/05/22 1259 09/05/22 1824   09/06/22 0600  acyclovir (ZOVIRAX) 500 mg in dextrose 5 % 100 mL IVPB  Status:  Discontinued        500 mg 110 mL/hr over 60 Minutes Intravenous Every 8 hours 09/05/22 2125 09/06/22 1116   09/06/22 0200  vancomycin (VANCOREADY) IVPB 750 mg/150 mL  Status:  Discontinued        750 mg 150 mL/hr over 60 Minutes Intravenous Every 12 hours 09/05/22 2131 09/06/22 1116   09/06/22 0000  ampicillin (OMNIPEN) 2 g in sodium chloride 0.9 % 100 mL IVPB  Status:  Discontinued        2 g 300 mL/hr over 20 Minutes Intravenous Every 4 hours  09/05/22 2125 09/06/22 1116   09/05/22 2200  ceFEPIme (MAXIPIME) 2 g in sodium chloride 0.9 % 100 mL IVPB  Status:  Discontinued        2 g 200 mL/hr over 30 Minutes Intravenous Every 12 hours 09/05/22 1259 09/05/22 1824   09/05/22 2015  ampicillin (OMNIPEN) 2 g in sodium chloride 0.9 % 100 mL IVPB  Status:  Discontinued        2 g 300 mL/hr over 20 Minutes Intravenous Every 6 hours 09/05/22 1921 09/05/22 2125   09/05/22 2000  cefTRIAXone (ROCEPHIN) 2 g in sodium chloride 0.9 % 100 mL IVPB        2 g 200 mL/hr over 30 Minutes Intravenous Every 12 hours 09/05/22 1826 09/19/22 1959   09/05/22 2000  Ampicillin-Sulbactam (UNASYN) 3 g in sodium chloride 0.9 % 100 mL IVPB  Status:  Discontinued        3 g 200 mL/hr over 30 Minutes Intravenous Every 6 hours 09/05/22 1850 09/05/22 1921   09/05/22 2000  acyclovir (ZOVIRAX) 500 mg in dextrose 5 % 100 mL IVPB  Status:    Discontinued        500 mg 110 mL/hr over 60 Minutes Intravenous Every 12 hours 09/05/22 1857 09/05/22 2125   09/05/22 1115  ceFEPIme (MAXIPIME) 2 g in sodium chloride 0.9 % 100 mL IVPB        2 g 200 mL/hr over 30 Minutes Intravenous  Once 09/05/22 1113 09/05/22 1219   09/05/22 1115  metroNIDAZOLE (FLAGYL) IVPB 500 mg        500 mg 100 mL/hr over 60 Minutes Intravenous  Once 09/05/22 1113 09/05/22 1353   09/05/22 1115  vancomycin (VANCOCIN) IVPB 1000 mg/200 mL premix        1,000 mg 200 mL/hr over 60 Minutes Intravenous  Once 09/05/22 1113 09/05/22 1515       Medications: Scheduled Meds:  bictegravir-emtricitabine-tenofovir AF  1 tablet Oral Daily   Chlorhexidine Gluconate Cloth  6 each Topical Daily   enoxaparin (LOVENOX) injection  40 mg Subcutaneous Q24H   feeding supplement  237 mL Oral TID BM   melatonin  3 mg Oral QHS   sodium chloride flush  10-40 mL Intracatheter Q12H   thiamine  100 mg Oral Daily   Continuous Infusions:  sodium chloride Stopped (09/08/22 1007)   cefTRIAXone (ROCEPHIN)  IV 2 g (09/11/22 0751)    PRN Meds:.mouth rinse, sodium chloride flush    Objective: Weight change:   Intake/Output Summary (Last 24 hours) at 09/11/2022 1426 Last data filed at 09/11/2022 1300 Gross per 24 hour  Intake 700 ml  Output 475 ml  Net 225 ml   Blood pressure 108/81, pulse 91, temperature 98.3 F (36.8 C), temperature source Oral, resp. rate 17, weight 52.8 kg, SpO2 100 %. Temp:  [98.3 F (36.8 C)-99.5 F (37.5 C)] 98.3 F (36.8 C) (07/07 1142) Pulse Rate:  [88-94] 91 (07/07 1142) Resp:  [17-19] 17 (07/07 1142) BP: (108-122)/(81-87) 108/81 (07/07 1142) SpO2:  [98 %-100 %] 100 % (07/07 1142)  Physical Exam: Physical Exam Constitutional:      Appearance: He is well-developed.  HENT:     Head: Normocephalic and atraumatic.  Eyes:     Conjunctiva/sclera: Conjunctivae normal.  Cardiovascular:     Rate and Rhythm: Normal rate and regular rhythm.  Pulmonary:     Effort: Pulmonary effort is normal. No respiratory distress.     Breath sounds: No wheezing.  Abdominal:     General: There is no distension.     Palpations: Abdomen is soft.  Musculoskeletal:        General: Normal range of motion.     Cervical back: Normal range of motion and neck supple.  Skin:    General: Skin is warm and dry.     Findings: No erythema or rash.  Neurological:     General: No focal deficit present.     Mental Status: He is alert and oriented to person, place, and time.  Psychiatric:        Attention and Perception: Attention normal.        Mood and Affect: Affect is flat.        Speech: Speech is delayed.        Behavior: Behavior normal. Behavior is cooperative.        Cognition and Memory: He exhibits impaired remote memory.      CBC:    BMET Recent Labs    09/09/22 1324 09/10/22 0930  NA 133* 131*  K 3.7 3.9  CL 100 97*  CO2 24 24  GLUCOSE 99 76    BUN 7* 10  CREATININE 0.54* 0.65  CALCIUM 7.6* 7.4*     Liver Panel  No results for input(s): "PROT", "ALBUMIN", "AST", "ALT",  "ALKPHOS", "BILITOT", "BILIDIR", "IBILI" in the last 72 hours.     Sedimentation Rate No results for input(s): "ESRSEDRATE" in the last 72 hours. C-Reactive Protein No results for input(s): "CRP" in the last 72 hours.  Micro Results: Recent Results (from the past 720 hour(s))  Resp panel by RT-PCR (RSV, Flu A&B, Covid) Anterior Nasal Swab     Status: None   Collection Time: 08/13/22  4:12 PM   Specimen: Anterior Nasal Swab  Result Value Ref Range Status   SARS Coronavirus 2 by RT PCR NEGATIVE NEGATIVE Final   Influenza A by PCR NEGATIVE NEGATIVE Final   Influenza B by PCR NEGATIVE NEGATIVE Final    Comment: (NOTE) The Xpert Xpress SARS-CoV-2/FLU/RSV plus assay is intended as an aid in the diagnosis of influenza from Nasopharyngeal swab specimens and should not be used as a sole basis for treatment. Nasal washings and aspirates are unacceptable for Xpert Xpress SARS-CoV-2/FLU/RSV testing.  Fact Sheet for Patients: https://www.fda.gov/media/152166/download  Fact Sheet for Healthcare Providers: https://www.fda.gov/media/152162/download  This test is not yet approved or cleared by the [place] FDA and has been authorized for detection and/or diagnosis of SARS-CoV-2 by FDA under an Emergency Use Authorization (EUA). This EUA will remain in effect (meaning this test can be used) for the duration of the COVID-19 declaration under Section 564(b)(1) of the Act, 21 U.S.C. section 360bbb-3(b)(1), unless the authorization is terminated or revoked.     Resp Syncytial Virus by PCR NEGATIVE NEGATIVE Final    Comment: (NOTE) Fact Sheet for Patients: https://www.fda.gov/media/152166/download  Fact Sheet for Healthcare Providers: https://www.fda.gov/media/152162/download  This test is not yet approved or cleared by the [place] FDA and has been authorized for detection and/or diagnosis of SARS-CoV-2 by FDA under an Emergency Use Authorization (EUA). This EUA will remain in  effect (meaning this test can be used) for the duration of the COVID-19 declaration under Section 564(b)(1) of the Act, 21 U.S.C. section 360bbb-3(b)(1), unless the authorization is terminated or revoked.  Performed at [facility], [address] [place], [place] [address]   Group A Strep by PCR     Status: None   Collection Time: 08/31/22  1:01 PM   Specimen: Throat; Sterile Swab  Result Value Ref Range Status   Group A Strep by PCR NOT DETECTED NOT DETECTED Final    Comment: Performed at [facility], [address] [place],  [address]  Resp panel by RT-PCR (RSV, Flu A&B, Covid) Throat     Status: None   Collection Time: 08/31/22  1:01 PM   Specimen: Throat; Nasal Swab  Result Value Ref Range Status   SARS Coronavirus 2 by RT PCR NEGATIVE NEGATIVE Final   Influenza A by PCR NEGATIVE NEGATIVE Final   Influenza B by PCR NEGATIVE NEGATIVE Final    Comment: (NOTE) The Xpert Xpress SARS-CoV-2/FLU/RSV plus assay is intended as an aid in the diagnosis of influenza from Nasopharyngeal swab specimens and should not be used as a sole basis for treatment. Nasal washings and aspirates are unacceptable for Xpert Xpress SARS-CoV-2/FLU/RSV testing.  Fact Sheet for Patients: https://www.fda.gov/media/152166/download  Fact Sheet for Healthcare Providers: https://www.fda.gov/media/152162/download  This test is not yet approved or cleared by the [place] FDA and has been authorized for detection and/or diagnosis of SARS-CoV-2 by FDA under an Emergency Use Authorization (EUA).   This EUA will remain in effect (meaning this test can be used) for the duration of the COVID-19 declaration under Section 564(b)(1) of the Act, 21 U.S.C. section 360bbb-3(b)(1), unless the authorization is terminated or revoked.     Resp Syncytial Virus by PCR NEGATIVE NEGATIVE Final    Comment: (NOTE) Fact Sheet for Patients: https://www.fda.gov/media/152166/download  Fact Sheet for  Healthcare Providers: https://www.fda.gov/media/152162/download  This test is not yet approved or cleared by the [place] FDA and has been authorized for detection and/or diagnosis of SARS-CoV-2 by FDA under an Emergency Use Authorization (EUA). This EUA will remain in effect (meaning this test can be used) for the duration of the COVID-19 declaration under Section 564(b)(1) of the Act, 21 U.S.C. section 360bbb-3(b)(1), unless the authorization is terminated or revoked.  Performed at [facility], [address] [place], [place] [address]   Resp panel by RT-PCR (RSV, Flu A&B, Covid) Anterior Nasal Swab     Status: None   Collection Time: 09/05/22 11:13 AM   Specimen: Anterior Nasal Swab  Result Value Ref Range Status   SARS Coronavirus 2 by RT PCR NEGATIVE NEGATIVE Final   Influenza A by PCR NEGATIVE NEGATIVE Final   Influenza B by PCR NEGATIVE NEGATIVE Final    Comment: (NOTE) The Xpert Xpress SARS-CoV-2/FLU/RSV plus assay is intended as an aid in the diagnosis of influenza from Nasopharyngeal swab specimens and should not be used as a sole basis for treatment. Nasal washings and aspirates are unacceptable for Xpert Xpress SARS-CoV-2/FLU/RSV testing.  Fact Sheet for Patients: https://www.fda.gov/media/152166/download  Fact Sheet for Healthcare Providers: https://www.fda.gov/media/152162/download  This test is not yet approved or cleared by the [place] FDA and has been authorized for detection and/or diagnosis of SARS-CoV-2 by FDA under an Emergency Use Authorization (EUA). This EUA will remain in effect (meaning this test can be used) for the duration of the COVID-19 declaration under Section 564(b)(1) of the Act, 21 U.S.C. section 360bbb-3(b)(1), unless the authorization is terminated or revoked.     Resp Syncytial Virus by PCR NEGATIVE NEGATIVE Final    Comment: (NOTE) Fact Sheet for Patients: https://www.fda.gov/media/152166/download  Fact Sheet  for Healthcare Providers: https://www.fda.gov/media/152162/download  This test is not yet approved or cleared by the [place] FDA and has been authorized for detection and/or diagnosis of SARS-CoV-2 by FDA under an Emergency Use Authorization (EUA). This EUA will remain in effect (meaning this test can be used) for the duration of the COVID-19 declaration under Section 564(b)(1) of the Act, 21 U.S.C. section 360bbb-3(b)(1), unless the authorization is terminated or revoked.  Performed at [facility], [address] [place], [place] [address]   Blood Culture (routine x 2)     Status: Abnormal   Collection Time: 09/05/22 11:13 AM   Specimen: BLOOD RIGHT ARM  Result Value Ref Range Status   Specimen Description BLOOD RIGHT ARM  Final   Special Requests   Final    BOTTLES DRAWN AEROBIC AND ANAEROBIC Blood Culture results may not be optimal due to an excessive volume of blood received in culture bottles   Culture  Setup Time   Final    GRAM POSITIVE COCCI IN BOTH AEROBIC AND ANAEROBIC BOTTLES CRITICAL RESULT CALLED TO, READ BACK BY AND VERIFIED WITH: PHARMD H. WILSON 09/06/22 @ 0036 BY AB Performed at [facility], [address] [place], [place] [address]    Culture STREPTOCOCCUS PNEUMONIAE (A)  Final   Report Status 09/08/2022 FINAL  Final     Organism ID, Bacteria STREPTOCOCCUS PNEUMONIAE  Final      Susceptibility   Streptococcus pneumoniae - MIC*    ERYTHROMYCIN >=8 RESISTANT Resistant     LEVOFLOXACIN 0.5 SENSITIVE Sensitive     VANCOMYCIN 0.25 SENSITIVE Sensitive     PENICILLIN (meningitis) 0.5 RESISTANT Resistant     PENO - penicillin 0.5      PENICILLIN (non-meningitis) 0.5 SENSITIVE Sensitive     PENICILLIN (oral) 0.5 INTERMEDIATE Intermediate     CEFTRIAXONE (non-meningitis) 0.25 SENSITIVE Sensitive     CEFTRIAXONE (meningitis) 0.25 SENSITIVE Sensitive     * STREPTOCOCCUS PNEUMONIAE  Blood Culture ID Panel (Reflexed)     Status: Abnormal   Collection  Time: 09/05/22 11:13 AM  Result Value Ref Range Status   Enterococcus faecalis NOT DETECTED NOT DETECTED Final   Enterococcus Faecium NOT DETECTED NOT DETECTED Final   Listeria monocytogenes NOT DETECTED NOT DETECTED Final   Staphylococcus species NOT DETECTED NOT DETECTED Final   Staphylococcus aureus (BCID) NOT DETECTED NOT DETECTED Final   Staphylococcus epidermidis NOT DETECTED NOT DETECTED Final   Staphylococcus lugdunensis NOT DETECTED NOT DETECTED Final   Streptococcus species DETECTED (A) NOT DETECTED Final    Comment: CRITICAL RESULT CALLED TO, READ BACK BY AND VERIFIED WITH: PHARMD H. WILSON 09/06/22 @ 0036 BY AB    Streptococcus agalactiae NOT DETECTED NOT DETECTED Final   Streptococcus pneumoniae DETECTED (A) NOT DETECTED Final    Comment: CRITICAL RESULT CALLED TO, READ BACK BY AND VERIFIED WITH: PHARMD H. WILSON 09/06/22 @ 0036 BY AB    Streptococcus pyogenes NOT DETECTED NOT DETECTED Final   A.calcoaceticus-baumannii NOT DETECTED NOT DETECTED Final   Bacteroides fragilis NOT DETECTED NOT DETECTED Final   Enterobacterales NOT DETECTED NOT DETECTED Final   Enterobacter cloacae complex NOT DETECTED NOT DETECTED Final   Escherichia coli NOT DETECTED NOT DETECTED Final   Klebsiella aerogenes NOT DETECTED NOT DETECTED Final   Klebsiella oxytoca NOT DETECTED NOT DETECTED Final   Klebsiella pneumoniae NOT DETECTED NOT DETECTED Final   Proteus species NOT DETECTED NOT DETECTED Final   Salmonella species NOT DETECTED NOT DETECTED Final   Serratia marcescens NOT DETECTED NOT DETECTED Final   Haemophilus influenzae NOT DETECTED NOT DETECTED Final   Neisseria meningitidis NOT DETECTED NOT DETECTED Final   Pseudomonas aeruginosa NOT DETECTED NOT DETECTED Final   Stenotrophomonas maltophilia NOT DETECTED NOT DETECTED Final   Candida albicans NOT DETECTED NOT DETECTED Final   Candida auris NOT DETECTED NOT DETECTED Final   Candida glabrata NOT DETECTED NOT DETECTED Final   Candida  krusei NOT DETECTED NOT DETECTED Final   Candida parapsilosis NOT DETECTED NOT DETECTED Final   Candida tropicalis NOT DETECTED NOT DETECTED Final   Cryptococcus neoformans/gattii NOT DETECTED NOT DETECTED Final    Comment: Performed at [facility], [address] [place], [place] [address]  Blood Culture (routine x 2)     Status: None   Collection Time: 09/05/22  1:28 PM   Specimen: BLOOD LEFT HAND  Result Value Ref Range Status   Specimen Description BLOOD LEFT HAND  Final   Special Requests   Final    BOTTLES DRAWN AEROBIC AND ANAEROBIC Blood Culture results may not be optimal due to an inadequate volume of blood received in culture bottles   Culture   Final    NO GROWTH 5 DAYS Performed at [facility], [address] [place], [place] [address]    Report Status 09/10/2022 FINAL  Final    CSF culture w Gram Stain     Status: None   Collection Time: 09/05/22  5:56 PM   Specimen: CSF; Cerebrospinal Fluid  Result Value Ref Range Status   Specimen Description CSF  Final   Special Requests LP  Final   Gram Stain   Final    WBC PRESENT, PREDOMINANTLY PMN GRAM POSITIVE COCCI IN PAIRS CYTOSPIN SMEAR CRITICAL RESULT CALLED TO, READ BACK BY AND VERIFIED WITH: RN JORDAN ALLEN ON 09/05/22 @ 1853 BY DRT Performed at [facility], [address] [place], [place] [address]    Culture RARE STREPTOCOCCUS PNEUMONIAE  Final   Report Status 09/08/2022 FINAL  Final   Organism ID, Bacteria STREPTOCOCCUS PNEUMONIAE  Final      Susceptibility   Streptococcus pneumoniae - MIC*    ERYTHROMYCIN >=8 RESISTANT Resistant     LEVOFLOXACIN 0.5 SENSITIVE Sensitive     VANCOMYCIN 0.25 SENSITIVE Sensitive     PENICILLIN (meningitis) 0.5 RESISTANT Resistant     PENO - penicillin 0.5      PENICILLIN (non-meningitis) 0.5 SENSITIVE Sensitive     PENICILLIN (oral) 0.5 INTERMEDIATE Intermediate     CEFTRIAXONE (non-meningitis) 0.25 SENSITIVE Sensitive     CEFTRIAXONE (meningitis) 0.25  SENSITIVE Sensitive     * RARE STREPTOCOCCUS PNEUMONIAE  Culture, fungus without smear     Status: None (Preliminary result)   Collection Time: 09/05/22  5:56 PM   Specimen: CSF; Cerebrospinal Fluid  Result Value Ref Range Status   Specimen Description CSF  Final   Special Requests LP  Final   Culture   Final    NO FUNGUS ISOLATED AFTER 5 DAYS Performed at [facility], [address] [place], [place] [address]    Report Status PENDING  Incomplete  MRSA Next Gen by PCR, Nasal     Status: None   Collection Time: 09/05/22  6:41 PM   Specimen: Nasal Mucosa; Nasal Swab  Result Value Ref Range Status   MRSA by PCR Next Gen NOT DETECTED NOT DETECTED Final    Comment: (NOTE) The GeneXpert MRSA Assay (FDA approved for NASAL specimens only), is one component of a comprehensive MRSA colonization surveillance program. It is not intended to diagnose MRSA infection nor to guide or monitor treatment for MRSA infections. Test performance is not FDA approved in patients less than 2 years old. Performed at [facility], [address] [place], [place] [address]   Culture, blood (Routine X 2)     Status: None   Collection Time: 09/05/22  6:53 PM   Specimen: BLOOD  Result Value Ref Range Status   Specimen Description BLOOD RIGHT ANTECUBITAL  Final   Special Requests   Final    BOTTLES DRAWN AEROBIC AND ANAEROBIC Blood Culture adequate volume   Culture   Final    NO GROWTH 5 DAYS Performed at  Hospital Lab, [address] [place], [place] [address]    Report Status 09/10/2022 FINAL  Final  Culture, blood (Routine X 2)     Status: None   Collection Time: 09/05/22  6:55 PM   Specimen: BLOOD  Result Value Ref Range Status   Specimen Description BLOOD SITE NOT SPECIFIED  Final   Special Requests   Final    BOTTLES DRAWN AEROBIC AND ANAEROBIC Blood Culture results may not be optimal due to an inadequate volume of blood received in culture bottles   Culture   Final    NO  GROWTH 5 DAYS   Performed at [facility], [address] [place], [place] [address]    Report Status 09/10/2022 FINAL  Final  Culture, blood (Routine X 2) w Reflex to ID Panel     Status: None   Collection Time: 09/06/22 11:40 PM   Specimen: BLOOD  Result Value Ref Range Status   Specimen Description BLOOD BLOOD RIGHT HAND  Final   Special Requests   Final    BOTTLES DRAWN AEROBIC AND ANAEROBIC Blood Culture adequate volume   Culture   Final    NO GROWTH 5 DAYS Performed at [facility], [address] [place], [place] [address]    Report Status 09/11/2022 FINAL  Final  Culture, blood (Routine X 2) w Reflex to ID Panel     Status: None   Collection Time: 09/06/22 11:40 PM   Specimen: BLOOD  Result Value Ref Range Status   Specimen Description BLOOD BLOOD LEFT HAND  Final   Special Requests   Final    BOTTLES DRAWN AEROBIC AND ANAEROBIC Blood Culture adequate volume   Culture   Final    NO GROWTH 5 DAYS Performed at [facility], [address] [place], [place] [address]    Report Status 09/11/2022 FINAL  Final    Studies/Results: No results found.    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   We will investigate further literature on IRIS to pneumococcus  Dr. Comer is back tomorrow.   LOS: 6 days   Christopher Farrell 09/11/2022, 2:26 PM  

## 2022-09-11 NOTE — Progress Notes (Signed)
Physical Therapy Treatment Patient Details Name: Christopher Farrell MRN: [id number] DOB: [birth year] Today's Date: 09/11/2022   History of Present Illness Pt is a 62 y/o male presenting on 7/1 with cough and AMS. Noted presented to ED on 6/26 and found with multifocal pneumonia. Returned to ED after being found unresponsive, admitted with sepsis. 2 seizures in ED. CSF reveals bacterial meningitis. MRI with "multifocal acute/subacute ischemia within the right greater than left hemispheres and left cerebellum. Largest lesions in R parietal and occipital lobes". Intubated 7/1-7/3. PMH: HIV.    PT Comments  Pt received in bed, continues to be disoriented to date/ time/ situation, had pulled off Purewick but was unaware. Was able to verbalize that he needed to urinate and was able to use urinal sitting up in bed. Pt able to progress with mobility today with use of RW. Min A to stand and walk short distance in room and to recliner with min A. HR 106 bpm with ambulation. Pt reports fatigue with all activity. Pt assisted with set up for breakfast in recliner. PT will continue to follow.     Assistance Recommended at Discharge Frequent or constant Supervision/Assistance  If plan is discharge home, recommend the following:  Can travel by private vehicle    Two people to help with walking and/or transfers;Two people to help with bathing/dressing/bathroom;Assistance with cooking/housework;Direct supervision/assist for medications management;Direct supervision/assist for financial management;Assist for transportation      Equipment Recommendations  BSC/3in1;Wheelchair (measurements PT);Wheelchair cushion (measurements PT);Rolling walker (2 wheels) (pending progress)    Recommendations for Other Services Rehab consult     Precautions / Restrictions Precautions Precautions: Fall Precaution Comments: upon arrival, purewick laying beside pt in bed, not attached, looked as if he had pulled it off but he had  no memory of this. NT notified and pt given a urinal Restrictions Weight Bearing Restrictions: No     Mobility  Bed Mobility Overal bed mobility: Needs Assistance Bed Mobility: Rolling, Sidelying to Sit Rolling: Modified independent (Device/Increase time) Sidelying to sit: Supervision       General bed mobility comments: pt rolled each direction and was able to come to sitting EOB with increased time but no physical assist    Transfers Overall transfer level: Needs assistance Equipment used: Rolling walker (2 wheels) Transfers: Sit to/from Stand, Bed to chair/wheelchair/BSC Sit to Stand: Min assist   Step pivot transfers: Min assist       General transfer comment: vc's for hand placement, min A to steady as pt rose from bed. Pt able to take pivot steps to recliner with use of RW    Ambulation/Gait Ambulation/Gait assistance: Min assist Gait Distance (Feet): 8 Feet Assistive device: Rolling walker (2 wheels) Gait Pattern/deviations: Step-through pattern, Trunk flexed, Decreased stride length Gait velocity: decreased Gait velocity interpretation: <1.31 ft/sec, indicative of household ambulator   General Gait Details: pt ambulated 4' fwd and 4' bkwd with very small steps, vc's for proximity to RW. Min A to steady   Comptroller Bed    Modified Rankin (Stroke Patients Only) Modified Rankin (Stroke Patients Only) Pre-Morbid Rankin Score: No symptoms Modified Rankin: Moderately severe disability     Balance Overall balance assessment: Needs assistance Sitting-balance support: Feet supported Sitting balance-Leahy Scale: Fair     Standing balance support: Bilateral upper extremity supported, During functional activity Standing balance-Leahy Scale: Poor Standing balance comment: unsteady without UE support and  pt reports feeling "weak all over"                            Cognition Arousal/Alertness:  Awake/alert Behavior During Therapy: Flat affect Overall Cognitive Status: Impaired/Different from baseline Area of Impairment: Orientation, Attention, Memory, Following commands, Safety/judgement, Awareness, Problem solving                 Orientation Level: Disoriented to, Time, Situation Current Attention Level: Sustained Memory: Decreased recall of precautions, Decreased short-term memory Following Commands: Follows one step commands consistently, Follows one step commands with increased time Safety/Judgement: Decreased awareness of safety, Decreased awareness of deficits Awareness: Intellectual Problem Solving: Slow processing, Decreased initiation, Difficulty sequencing, Requires verbal cues, Requires tactile cues General Comments: pt answers questions but does not initiate conversation of ask questions. STM deficits notable. Follows simple commands        Exercises      General Comments General comments (skin integrity, edema, etc.): HR 106 bpm in standing, RR in 30's      Pertinent Vitals/Pain Pain Assessment Pain Assessment: No/denies pain    Home Living                          Prior Function            PT Goals (current goals can now be found in the care plan section) Acute Rehab PT Goals Patient Stated Goal: did not state; family desires for pt to improve PT Goal Formulation: With patient/family Time For Goal Achievement: 09/22/22 Potential to Achieve Goals: Good Progress towards PT goals: Progressing toward goals    Frequency    Min 4X/week      PT Plan Current plan remains appropriate    Co-evaluation              AM-PAC PT "6 Clicks" Mobility   Outcome Measure  Help needed turning from your back to your side while in a flat bed without using bedrails?: None Help needed moving from lying on your back to sitting on the side of a flat bed without using bedrails?: A Little Help needed moving to and from a bed to a chair  (including a wheelchair)?: A Little Help needed standing up from a chair using your arms (e.g., wheelchair or bedside chair)?: A Little Help needed to walk in hospital room?: A Lot Help needed climbing 3-5 steps with a railing? : Total 6 Click Score: 16    End of Session Equipment Utilized During Treatment: Gait belt Activity Tolerance: Patient tolerated treatment well Patient left: with call bell/phone within reach;in chair;with chair alarm set Nurse Communication: Mobility status PT Visit Diagnosis: Unsteadiness on feet (R26.81);Other abnormalities of gait and mobility (R26.89);Muscle weakness (generalized) (M62.81);Difficulty in walking, not elsewhere classified (R26.2)     Time: 9629-5284 PT Time Calculation (min) (ACUTE ONLY): 24 min  Charges:    $Gait Training: 8-22 mins $Therapeutic Activity: 8-22 mins PT General Charges $$ ACUTE PT VISIT: 1 Visit                     Lyanne Co, PT  Acute Rehab Services Secure chat preferred Office [phone]    Lawana Chambers Kandace Elrod 09/11/2022, 10:42 AM

## 2022-09-11 NOTE — Progress Notes (Signed)
STROKE TEAM PROGRESS NOTE   SUBJECTIVE (INTERVAL HISTORY) His two family members are at the bedside. Pt sitting in chair, awake alert but still lethargic, lunch tray has not touched. Per RN, pt ate a little bit this morning. Still poor po intake. Will consult dietitian and will add ensure. Pending TEE in am.    OBJECTIVE Temp:  [98.3 F (36.8 C)-99.5 F (37.5 C)] 98.3 F (36.8 C) (07/07 1142) Pulse Rate:  [88-94] 91 (07/07 1142) Cardiac Rhythm: Normal sinus rhythm (07/07 0759) Resp:  [17-19] 17 (07/07 1142) BP: (108-122)/(81-87) 108/81 (07/07 1142) SpO2:  [98 %-100 %] 100 % (07/07 1142)  Recent Labs  Lab 09/10/22 1135 09/10/22 1636 09/10/22 2113 09/11/22 0606 09/11/22 1143  GLUCAP 95 116* 98 92 125*   Recent Labs  Lab 09/06/22 0512 09/06/22 1642 09/07/22 0700 09/07/22 1634 09/08/22 0536 09/09/22 1324 09/10/22 0930  NA 134*  --  138 138 141 133* 131*  K 3.4*  --  6.1* 4.0 3.3* 3.7 3.9  CL 106  --  112* 107 104 100 97*  CO2 19*  --  19* 23 25 24 24   GLUCOSE 98  --  100* 122* 97 99 76  BUN 11  --  14 10 9  7* 10  CREATININE 0.63  --  0.65 0.59* 0.63 0.54* 0.65  CALCIUM 7.3*  --  7.5* 7.7* 7.6* 7.6* 7.4*  MG 2.1 2.1 2.4 2.2 2.0  --   --   PHOS 3.3 2.7 2.9 1.8* 1.7* 3.4  --    Recent Labs  Lab 09/05/22 1113 09/05/22 1855  AST 78* 94*  ALT 31 36  ALKPHOS 52 42  BILITOT 1.0 0.8  PROT 7.3 5.9*  ALBUMIN 2.2* 1.8*   Recent Labs  Lab 09/05/22 1113 09/05/22 1226 09/05/22 1855 09/05/22 1925 09/06/22 1000 09/08/22 0536 09/10/22 0930  WBC 19.5*  --  17.3*  --  14.4* 10.6* 9.7  NEUTROABS 16.0*  --  13.8*  --   --   --   --   HGB 12.1*   < > 10.1* 10.9* 9.6* 9.3* 9.9*  HCT 34.9*   < > 29.1* 32.0* 27.7* 27.9* 27.9*  MCV 87.7  --  84.6  --  89.4 87.5 87.2  PLT 224  --  170  --  137* 187 275   < > = values in this interval not displayed.   No results for input(s): "CKTOTAL", "CKMB", "CKMBINDEX", "TROPONINI" in the last 168 hours. No results for input(s):  "LABPROT", "INR" in the last 72 hours. No results for input(s): "COLORURINE", "LABSPEC", "PHURINE", "GLUCOSEU", "HGBUR", "BILIRUBINUR", "KETONESUR", "PROTEINUR", "UROBILINOGEN", "NITRITE", "LEUKOCYTESUR" in the last 72 hours.  Invalid input(s): "APPERANCEUR"     Component Value Date/Time   CHOL 58 09/07/2022 0927   TRIG 122 09/07/2022 0927   TRIG 11 02/14/2008 0000   HDL <10 (L) 09/07/2022 0927   CHOLHDL NOT CALCULATED 09/07/2022 0927   VLDL 24 09/07/2022 0927   LDLCALC NOT CALCULATED 09/07/2022 0927   LDLCALC 62 06/29/2021 1010   Lab Results  Component Value Date   HGBA1C 6.4 (H) 09/06/2022      Component Value Date/Time   LABOPIA NONE DETECTED 09/06/2022 0655   COCAINSCRNUR NONE DETECTED 09/06/2022 0655   LABBENZ NONE DETECTED 09/06/2022 0655   AMPHETMU NONE DETECTED 09/06/2022 0655   THCU POSITIVE (A) 09/06/2022 0655   LABBARB NONE DETECTED 09/06/2022 0655    No results for input(s): "ETH" in the last 168 hours.  I have personally reviewed  the radiological images below and agree with the radiology interpretations.  [place] EKG SITE RITE  Result Date: 09/08/2022 If Site Rite image not attached, placement could not be confirmed due to current cardiac rhythm.  DG Abd Portable 1V  Result Date: 09/07/2022 CLINICAL DATA:  Feeding tube placement. EXAM: PORTABLE ABDOMEN - 1 VIEW COMPARISON:  Abdominal CT 09/05/2022. FINDINGS: 0912 hours. Feeding tube tip projects over the L4 vertebral body, likely in the mid to distal stomach. The bowel gas pattern appears nonobstructive. Mild atelectasis at both lung bases. Telemetry leads overlie the abdomen and lower chest. IMPRESSION: Feeding tube tip projects over the mid to distal stomach. Electronically Signed   By: Carey Bullocks M.D.   On: 09/07/2022 09:20   CT ANGIO HEAD NECK W WO CM  Result Date: 09/07/2022 CLINICAL DATA:  Neuro deficit with acute stroke suspected EXAM: CT ANGIOGRAPHY HEAD AND NECK WITH AND WITHOUT CONTRAST TECHNIQUE:  Multidetector CT imaging of the head and neck was performed using the standard protocol during bolus administration of intravenous contrast. Multiplanar CT image reconstructions and MIPs were obtained to evaluate the vascular anatomy. Carotid stenosis measurements (when applicable) are obtained utilizing NASCET criteria, using the distal internal carotid diameter as the denominator. RADIATION DOSE REDUCTION: This exam was performed according to the departmental dose-optimization program which includes automated exposure control, adjustment of the mA and/or kV according to patient size and/or use of iterative reconstruction technique. 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 There is extensive artifact from EEG leads, no evidence of hemorrhage or progression. No hydrocephalus or shift Vascular: No hyperdense vessel or unexpected calcification. Skull: Normal. Negative for fracture or focal lesion. Sinuses/Orbits: No acute finding. Review of the MIP images confirms the above findings CTA NECK FINDINGS Aortic arch: Atheromatous calcification of the aorta with 3 vessel branching. Right carotid system: Vessels are smoothly contoured and widely patent. Left carotid system: ICA tortuosity. Vessels are smoothly contoured and widely patent. Vertebral arteries: Proximal subclavian atherosclerosis. The vertebral arteries are mildly tortuous. No stenosis, beading, or dissection. Skeleton: Ordinary cervical spine degeneration. Other neck: No acute or aggressive finding Upper chest: Clear apical lungs when accounting for artifact Review of the MIP images confirms the above findings CTA HEAD FINDINGS Anterior circulation: No significant stenosis, proximal occlusion, aneurysm, or vascular malformation. Posterior circulation: No significant stenosis,  proximal occlusion, aneurysm, or vascular malformation. Venous sinuses: Diffusely patent Anatomic variants: None significant Review of the MIP images confirms the above findings IMPRESSION: No emergent vascular finding. No flow limiting stenosis or ulceration of major arteries in the head and neck. Electronically Signed   By: Tiburcio Pea M.D.   On: 09/07/2022 04:10   ECHOCARDIOGRAM COMPLETE  Result Date: 09/06/2022    ECHOCARDIOGRAM REPORT   Patient Name:   Christopher Farrell Date of Exam: 09/06/2022 Medical Rec #:  [id number]          Height:       64.0 in Accession #:    [id number]         Weight:       115.1 lb Date of Birth:  1960/06/15          BSA:          1.547 m Patient Age:    61 years           BP:  92/61 mmHg Patient Gender: M                  HR:           63 bpm. Exam Location:  Inpatient Procedure: 2D Echo, Cardiac Doppler and Color Doppler Indications:    Other cardiac sounds R01.2  History:        Patient has no prior history of Echocardiogram examinations.                 Signs/Symptoms:Shortness of Breath; Risk Factors:Current Smoker.  Sonographer:    Lucendia Herrlich Referring Phys: [id number] LUKE SEMINARA IMPRESSIONS  1. Left ventricular ejection fraction, by estimation, is 60 to 65%. The left ventricle has normal function. The left ventricle has no regional wall motion abnormalities. Left ventricular diastolic parameters were normal.  2. Right ventricular systolic function is normal. The right ventricular size is normal.  3. There is a solid appearing mass on the atrial side of the mid/distal anterior leaflet Maximum diameter 1.5 cm Consider f/u TEE and cardiac MRI to further assess. The mitral valve is abnormal. Trivial mitral valve regurgitation. No evidence of mitral stenosis.  4. The aortic valve is tricuspid. There is mild calcification of the aortic valve. There is mild thickening of the aortic valve. Aortic valve regurgitation is not visualized. Aortic valve sclerosis is  present, with no evidence of aortic valve stenosis.  5. The inferior vena cava is dilated in size with >50% respiratory variability, suggesting right atrial pressure of 8 mmHg. FINDINGS  Left Ventricle: Left ventricular ejection fraction, by estimation, is 60 to 65%. The left ventricle has normal function. The left ventricle has no regional wall motion abnormalities. The left ventricular internal cavity size was normal in size. There is  no left ventricular hypertrophy. Left ventricular diastolic parameters were normal. Right Ventricle: The right ventricular size is normal. No increase in right ventricular wall thickness. Right ventricular systolic function is normal. Left Atrium: Left atrial size was normal in size. Right Atrium: Right atrial size was normal in size. Pericardium: There is no evidence of pericardial effusion. Mitral Valve: There is a solid appearing mass on the atrial side of the mid/distal anterior leaflet Maximum diameter 1.5 cm Consider f/u TEE and cardiac MRI to further assess. The mitral valve is abnormal. There is mild thickening of the mitral valve leaflet(s). There is mild calcification of the mitral valve leaflet(s). Trivial mitral valve regurgitation. No evidence of mitral valve stenosis. Tricuspid Valve: The tricuspid valve is normal in structure. Tricuspid valve regurgitation is not demonstrated. No evidence of tricuspid stenosis. Aortic Valve: The aortic valve is tricuspid. There is mild calcification of the aortic valve. There is mild thickening of the aortic valve. Aortic valve regurgitation is not visualized. Aortic valve sclerosis is present, with no evidence of aortic valve stenosis. Aortic valve peak gradient measures 5.7 mmHg. Pulmonic Valve: The pulmonic valve was normal in structure. Pulmonic valve regurgitation is not visualized. No evidence of pulmonic stenosis. Aorta: The aortic root is normal in size and structure. Venous: The inferior vena cava is dilated in size with  greater than 50% respiratory variability, suggesting right atrial pressure of 8 mmHg. IAS/Shunts: The interatrial septum appears to be lipomatous. No atrial level shunt detected by color flow Doppler.  LEFT VENTRICLE PLAX 2D LVIDd:         2.32 cm   Diastology LVIDs:         2.36 cm   LV e' medial:    11.80  cm/s LV PW:         0.89 cm   LV E/e' medial:  8.2 LV IVS:        0.42 cm   LV e' lateral:   12.70 cm/s LVOT diam:     2.00 cm   LV E/e' lateral: 7.6 LV SV:         58 LV SV Index:   37 LVOT Area:     3.14 cm  RIGHT VENTRICLE            IVC RV S prime:     8.24 cm/s  IVC diam: 2.20 cm TAPSE (M-mode): 2.2 cm LEFT ATRIUM             Index        RIGHT ATRIUM           Index LA diam:        3.60 cm 2.33 cm/m   RA Area:     12.80 cm LA Vol (A2C):   38.8 ml 25.08 ml/m  RA Volume:   26.40 ml  17.07 ml/m LA Vol (A4C):   40.1 ml 25.92 ml/m LA Biplane Vol: 39.1 ml 25.28 ml/m  AORTIC VALVE AV Area (Vmax): 2.64 cm AV Vmax:        119.00 cm/s AV Peak Grad:   5.7 mmHg LVOT Vmax:      100.00 cm/s LVOT Vmean:     61.100 cm/s LVOT VTI:       0.184 m  AORTA Ao Root diam: 3.00 cm Ao Asc diam:  3.10 cm MITRAL VALVE               TRICUSPID VALVE MV Area (PHT): 4.68 cm    TR Peak grad:   18.3 mmHg MV Decel Time: 162 msec    TR Vmax:        214.00 cm/s MV E velocity: 97.10 cm/s MV A velocity: 75.70 cm/s  SHUNTS MV E/A ratio:  1.28        Systemic VTI:  0.18 m                            Systemic Diam: 2.00 cm Charlton Haws MD Electronically signed by Charlton Haws MD Signature Date/Time: 09/06/2022/12:57:44 PM    Final    Overnight EEG with video  Result Date: 09/06/2022 Charlsie Quest, MD     09/07/2022 10:54 AM Patient Name: Christopher Farrell MRN: [id number] Epilepsy Attending: Charlsie Quest Referring Physician/Provider: [name], MD Duration: 09/05/2022 2036 to 09/06/2022 2036 Patient history: 62yo M with seizure getting eeg to evaluate for seizure. Level of alertness: comatose AEDs during EEG study: Propofol Technical  aspects: This EEG study was done with scalp electrodes positioned according to the 10-20 International system of electrode placement. Electrical activity was reviewed with band pass filter of 1-70Hz , sensitivity of 7 uV/mm, display speed of 63mm/sec with a 60Hz  notched filter applied as appropriate. EEG data were recorded continuously and digitally stored.  Video monitoring was available and reviewed as appropriate. Description: EEG showed continuous generalized 3 to 7 Hz theta-delta slowing admixed with intermittent generalized 13-15hz  beta activity. Hyperventilation and photic stimulation were not performed.   ABNORMALITY - Continuous slow, generalized IMPRESSION: This study is suggestive of severe diffuse encephalopathy, nonspecific etiology but likely related to sedation. No seizures or epileptiform discharges were seen throughout the recording. Charlsie Quest   MR BRAIN W WO CONTRAST  Result  Date: 09/06/2022 CLINICAL DATA:  Meningitis/CNS infection suspected. EXAM: MRI HEAD WITHOUT AND WITH CONTRAST TECHNIQUE: Multiplanar, multiecho pulse sequences of the brain and surrounding structures were obtained without and with intravenous contrast. CONTRAST:  5mL GADAVIST GADOBUTROL 1 MMOL/ML IV SOLN COMPARISON:  None Available. FINDINGS: Brain: Multifocal abnormal diffusion restriction within the right greater than left hemispheres. The largest lesions are located in the right parietal and occipital lobes. There are punctate foci of acute ischemia within the contralateral hemisphere and left cerebellum. No acute or chronic hemorrhage. There is multifocal hyperintense T2-weighted signal within the white matter. Parenchymal volume and CSF spaces are normal. The midline structures are normal. There is no abnormal contrast enhancement. Vascular: Major flow voids are preserved. Skull and upper cervical spine: Normal calvarium and skull base. Visualized upper cervical spine and soft tissues are normal. Sinuses/Orbits:No  paranasal sinus fluid levels or advanced mucosal thickening. No mastoid or middle ear effusion. Normal orbits. IMPRESSION: Multifocal acute/subacute ischemia within the right greater than left cerebral hemispheres and left cerebellum. The largest lesions are located in the right parietal and occipital lobes. No hemorrhage or mass effect. Electronically Signed   By: Deatra Robinson M.D.   On: 09/06/2022 01:57   DG CHEST PORT 1 VIEW  Result Date: 09/05/2022 CLINICAL DATA:  OG tube placement EXAM: PORTABLE CHEST 1 VIEW COMPARISON:  Chest radiograph dated 09/05/2022 FINDINGS: Upper lungs are excluded from the field of view. Enteric tube terminates in the proximal gastric body. IMPRESSION: Enteric tube terminates in the proximal gastric body. Electronically Signed   By: Charline Bills M.D.   On: 09/05/2022 22:05   DG Chest Port 1 View  Result Date: 09/05/2022 CLINICAL DATA:  ETT placement EXAM: PORTABLE CHEST 1 VIEW COMPARISON:  09/05/2022 at 1149 hours FINDINGS: Endotracheal tube terminates 6.5 cm above the carina. Mild patchy right upper lobe and right lower lobe pneumonia. Left lung is clear. No pleural effusion or pneumothorax. The heart is normal in size. IMPRESSION: Endotracheal tube terminates 6.5 cm above the carina. Mild patchy right lung pneumonia, as above. Electronically Signed   By: Charline Bills M.D.   On: 09/05/2022 19:21   CT ABDOMEN PELVIS W CONTRAST  Result Date: 09/05/2022 CLINICAL DATA:  Abdominal pain, unresponsive EXAM: CT ABDOMEN AND PELVIS WITH CONTRAST TECHNIQUE: Multidetector CT imaging of the abdomen and pelvis was performed using the standard protocol following bolus administration of intravenous contrast. RADIATION DOSE REDUCTION: This exam was performed according to the departmental dose-optimization program which includes automated exposure control, adjustment of the mA and/or kV according to patient size and/or use of iterative reconstruction technique. CONTRAST:  75mL  OMNIPAQUE IOHEXOL 350 MG/ML SOLN COMPARISON:  None Available. FINDINGS: Examination is limited by breath motion artifact throughout. Lower chest: Please see separately reported examination of the chest Hepatobiliary: No solid liver abnormality is seen. No gallstones, gallbladder wall thickening, or biliary dilatation. Pancreas: Unremarkable. No pancreatic ductal dilatation or surrounding inflammatory changes. Spleen: Normal in size without significant abnormality. Adrenals/Urinary Tract: Adrenal glands are unremarkable. Kidneys are normal, without renal calculi, solid lesion, or hydronephrosis. Bladder is unremarkable. Stomach/Bowel: Stomach is within normal limits. Appendix appears normal. No evidence of bowel wall thickening, distention, or inflammatory changes. Colon is fluid-filled to the rectum. Vascular/Lymphatic: Aortic atherosclerosis. No enlarged abdominal or pelvic lymph nodes. Reproductive: No mass or other significant abnormality. Other: No abdominal wall hernia or abnormality. No ascites. Musculoskeletal: No acute or significant osseous findings. IMPRESSION: 1. No acute CT findings of the abdomen or pelvis to explain  abdominal pain. 2. Colon is fluid-filled to the rectum, suggestive of diarrheal illness. Aortic Atherosclerosis (ICD10-I70.0). Electronically Signed   By: Jearld Lesch M.D.   On: 09/05/2022 15:28   CT Angio Chest PE W/Cm &/Or Wo Cm  Result Date: 09/05/2022 CLINICAL DATA:  PE suspected, pneumonia EXAM: CT ANGIOGRAPHY CHEST WITH CONTRAST TECHNIQUE: Multidetector CT imaging of the chest was performed using the standard protocol during bolus administration of intravenous contrast. Multiplanar CT image reconstructions and MIPs were obtained to evaluate the vascular anatomy. RADIATION DOSE REDUCTION: This exam was performed according to the departmental dose-optimization program which includes automated exposure control, adjustment of the mA and/or kV according to patient size and/or use of  iterative reconstruction technique. CONTRAST:  75mL OMNIPAQUE IOHEXOL 350 MG/ML SOLN COMPARISON:  None Available. FINDINGS: Cardiovascular: Satisfactory opacification of the pulmonary arteries to the segmental level. No evidence of pulmonary embolism. Normal heart size. Left coronary artery calcifications. No pericardial effusion. Aortic atherosclerosis. Mediastinum/Nodes: No enlarged mediastinal, hilar, or axillary lymph nodes. Thyroid gland, trachea, and esophagus demonstrate no significant findings. Lungs/Pleura: Centrilobular and paraseptal emphysema. Diffuse bilateral bronchial wall thickening. Extensive bronchiolar plugging. Dense, heterogeneous nodular and consolidative airspace opacity throughout the right lung, particularly in the inferior right upper lobe (series 4, image 62), although also seen in the lateral segment right middle lobe (series 4, image 85). Lungs are clear. No pleural effusion or pneumothorax. Upper Abdomen: Please see separately reported examination of the abdomen. Musculoskeletal: No chest wall abnormality. No acute osseous findings. Review of the MIP images confirms the above findings. IMPRESSION: 1. Negative examination for pulmonary embolism. 2. Dense, heterogeneous nodular and consolidative airspace opacity throughout the right lung, particularly in the inferior right upper lobe, although also seen in the lateral segment right middle lobe. Findings are consistent with multifocal infection. 3. Diffuse bilateral bronchial wall thickening and extensive bronchiolar plugging, consistent with nonspecific infectious or inflammatory bronchitis. 4. Emphysema. 5. Coronary artery disease. Aortic Atherosclerosis (ICD10-I70.0) and Emphysema (ICD10-J43.9). Electronically Signed   By: Jearld Lesch M.D.   On: 09/05/2022 15:22   CT HEAD WO CONTRAST ( )  Result Date: 09/05/2022 CLINICAL DATA:  Mental status change, unknown cause EXAM: CT HEAD WITHOUT CONTRAST TECHNIQUE: Contiguous axial images  were obtained from the base of the skull through the vertex without intravenous contrast. RADIATION DOSE REDUCTION: This exam was performed according to the departmental dose-optimization program which includes automated exposure control, adjustment of the mA and/or kV according to patient size and/or use of iterative reconstruction technique. COMPARISON:  CT Head 03/26/13 FINDINGS: Brain: No evidence of acute infarction, hemorrhage, hydrocephalus, extra-axial collection or mass lesion/mass effect. Sequela of moderate chronic microvascular ischemic change. Vascular: No hyperdense vessel or unexpected calcification. Skull: Normal. Negative for fracture or focal lesion. Sinuses/Orbits: No middle ear or mastoid effusion. Chronic bilateral lamina papyracea fractures. Orbits are unremarkable. Other: None. IMPRESSION: No acute intracranial abnormality. Electronically Signed   By: Lorenza Cambridge M.D.   On: 09/05/2022 15:22   DG Chest Port 1 View  Result Date: 09/05/2022 CLINICAL DATA:  Sepsis EXAM: PORTABLE CHEST 1 VIEW COMPARISON:  X-ray 08/31/2022 FINDINGS: Hyperinflation. No pneumothorax, effusion or edema. Normal cardiopericardial silhouette. Overlapping cardiac leads. There are some subtle opacity in the right midlung, inferior aspect of the right upper lobe. IMPRESSION: Hyperinflation. Persistent subtle right midlung opacity. Recommend follow-up Electronically Signed   By: Karen Kays M.D.   On: 09/05/2022 12:40   DG Chest 2 View  Result Date: 08/31/2022 CLINICAL DATA:  Chest radiograph dated August 13, 2022 EXAM: CHEST - 2 VIEW COMPARISON:  Chest radiograph dated August 13, 2022 FINDINGS: The heart size and mediastinal contours are within normal limits. Interval improvement of the right upper and middle lobe lung opacities. Persistent atelectasis of the right middle lobe with mild remaining hazy opacities of the right upper and middle lobe suggesting resolving pneumonia. No pleural effusion. The visualized skeletal  structures are unremarkable. IMPRESSION: Interval improvement of the right upper and middle lobe lung opacities. Persistent atelectasis of the right middle lobe with mild remaining hazy opacities of the right upper and middle lobe suggesting resolving pneumonia. Electronically Signed   By: Larose Hires D.O.   On: 08/31/2022 14:32   DG Chest 2 View  Result Date: 08/13/2022 CLINICAL DATA:  Chest pain EXAM: CHEST - 2 VIEW COMPARISON:  Previous studies including the examination of 12/07/2019 FINDINGS: Cardiac size is within normal limits. New patchy moderate to large infiltrates in right upper lobe and right middle lobe suggesting multifocal pneumonia. Rest of the lung fields are clear. There is no pleural effusion or pneumothorax. IMPRESSION: New patchy alveolar infiltrates are seen in right upper lobe and right middle lobe suggesting multifocal pneumonia. Electronically Signed   By: Ernie Avena M.D.   On: 08/13/2022 14:59     PHYSICAL EXAM  Temp:  [98.3 F (36.8 C)-99.5 F (37.5 C)] 98.3 F (36.8 C) (07/07 1142) Pulse Rate:  [88-94] 91 (07/07 1142) Resp:  [17-19] 17 (07/07 1142) BP: (108-122)/(81-87) 108/81 (07/07 1142) SpO2:  [98 %-100 %] 100 % (07/07 1142)  General - Well nourished, well developed, in no apparent distress.  Ophthalmologic - fundi not visualized due to noncooperation.  Cardiovascular - Regular rhythm and rate.  Mental Status -  Level of arousal and orientation to month, place, and person were intact, but not orientated to year Language including expression, naming, repetition, comprehension was assessed and found intact.  Mild psychomotor slowing Fund of Knowledge was assessed and was intact.  Cranial Nerves II - XII - II - Visual field intact OU. III, IV, VI - Extraocular movements intact. V - Facial sensation intact bilaterally. VII - Facial movement intact bilaterally. VIII - Hearing & vestibular intact bilaterally. X - Palate elevates symmetrically. XI -  Chin turning & shoulder shrug intact bilaterally. XII - Tongue protrusion intact.  Motor Strength - The patient's strength was symmetrical in all extremities and pronator drift was absent.  Bulk was normal and fasciculations were absent.   Motor Tone - Muscle tone was assessed at the neck and appendages and was normal.  Reflexes - The patient's reflexes were symmetrical in all extremities and he had no pathological reflexes.  Sensory - Light touch, temperature/pinprick were assessed and were symmetrical.    Coordination - The patient had normal movements in the hands with no ataxia or dysmetria.  Tremor was absent.  Gait and Station - deferred.   ASSESSMENT/PLAN Christopher Farrell is a 62 y.o. male with history of HIV not on antiretrovirals who originally presented with pneumonia, cough and decreased responsiveness.  He also had several seizures shortly after presentation.  CSF and blood cultures were positive for strep pneumo, and TTE demonstrated mitral valve vegetation.  MRI brain shows multiple scattered strokes, likely from septic emboli.   Stroke - embolic shower, etiology likely cardioembolic in the setting of endocarditis CT head No acute abnormality.  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 TEE pending on monday LDL 47 HgbA1c 6.4 VTE prophylaxis -Lovenox No antithrombotic prior to admission, now on no antithrombotics given endocarditis Therapy recommendations:  CIR Disposition: Pending   Seizure activity Patient had seizure activity upon arrival to the hospital in the setting of meningitis,  LTM showed no further seizure activity No AED now given provoked seizure Seizure procaution No driving until seizure-free for 6 months   Bacterial meningitis  and strep pneumo bacteremia Pulmonary septic emboli Endocarditis Leukocytosis WBC 14.4->10.6 CSF and blood cultures positive for strep pneumo 2D Echo 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 TEE pending on Monday Continue Rocephin   HIV infection Patient has history of HIV Not compliant with antiretrovirals at home Currently on Biktarvy Follow-up with ID as outpatient   Hypertension Home meds: None Stable Maintain normotension   Hyperlipidemia Home meds: None LDL 47, goal < 70 No statin needed given LDL at goal   Tobacco Abuse Patient smokes cigarettes but not every day tobacco cessation education provided Patient is waiting to create   Substance Abuse UDS positive for THC  cessation education provided Patient is willing to create   Dysphagia Patient has post-stroke dysphagia Now on dysphagia 1 and thin liquid. encourage p.o. intake Dietitian consult Add ensure tid Speech on board   Other Stroke Risk Factors     Other Active Problems   Hospital day # 6   Marvel Plan, MD PhD Stroke Neurology 09/11/2022 12:15 PM    To contact Stroke Continuity provider, please refer to [email or web address]. After hours, contact General Neurology

## 2022-09-11 NOTE — Plan of Care (Signed)
  Problem: Metabolic: Goal: Ability to maintain appropriate glucose levels will improve Outcome: Progressing   Problem: Skin Integrity: Goal: Risk for impaired skin integrity will decrease Outcome: Progressing   Problem: Education: Goal: Knowledge of General Education information will improve Description: Including pain rating scale, medication(s)/side effects and non-pharmacologic comfort measures Outcome: Progressing   Problem: Clinical Measurements: Goal: Respiratory complications will improve Outcome: Progressing Goal: Cardiovascular complication will be avoided Outcome: Progressing   Problem: Activity: Goal: Risk for activity intolerance will decrease Outcome: Progressing   Problem: Elimination: Goal: Will not experience complications related to bowel motility Outcome: Progressing

## 2022-09-11 NOTE — Progress Notes (Addendum)
Subjective: No new complaints   Antibiotics:  Anti-infectives (From admission, onward)    Start     Dose/Rate Route Frequency Ordered Stop   09/10/22 1000  bictegravir-emtricitabine-tenofovir AF (BIKTARVY) 50-200-25 MG per tablet 1 tablet        1 tablet Oral Daily 09/09/22 1123     09/08/22 1003  dolutegravir (TIVICAY) tablet 50 mg  Status:  Discontinued        50 mg Oral Daily 09/08/22 1003 09/09/22 1123   09/06/22 1600  vancomycin (VANCOREADY) IVPB 750 mg/150 mL  Status:  Discontinued        750 mg 150 mL/hr over 60 Minutes Intravenous Every 12 hours 09/06/22 1502 09/08/22 0915   09/06/22 1600  dolutegravir (TIVICAY) tablet 50 mg  Status:  Discontinued        50 mg Per Tube Daily 09/06/22 1502 09/07/22 0648   09/06/22 1600  emtricitabine-tenofovir AF (DESCOVY) 200-25 MG per tablet 1 tablet  Status:  Discontinued        1 tablet Per Tube Daily 09/06/22 1502 09/07/22 0648   09/06/22 1545  dolutegravir (TIVICAY) tablet 50 mg  Status:  Discontinued        50 mg Per Tube Daily 09/06/22 1455 09/08/22 1003   09/06/22 1545  emtricitabine-tenofovir AF (DESCOVY) 200-25 MG per tablet 1 tablet  Status:  Discontinued        1 tablet Per Tube Daily 09/06/22 1455 09/09/22 1123   09/06/22 1400  vancomycin (VANCOREADY) IVPB 750 mg/150 mL  Status:  Discontinued        750 mg 150 mL/hr over 60 Minutes Intravenous Every 24 hours 09/05/22 1259 09/05/22 1824   09/06/22 0600  acyclovir (ZOVIRAX) 500 mg in dextrose 5 % 100 mL IVPB  Status:  Discontinued        500 mg 110 mL/hr over 60 Minutes Intravenous Every 8 hours 09/05/22 2125 09/06/22 1116   09/06/22 0200  vancomycin (VANCOREADY) IVPB 750 mg/150 mL  Status:  Discontinued        750 mg 150 mL/hr over 60 Minutes Intravenous Every 12 hours 09/05/22 2131 09/06/22 1116   09/06/22 0000  ampicillin (OMNIPEN) 2 g in sodium chloride 0.9 % 100 mL IVPB  Status:  Discontinued        2 g 300 mL/hr over 20 Minutes Intravenous Every 4 hours  09/05/22 2125 09/06/22 1116   09/05/22 2200  ceFEPIme (MAXIPIME) 2 g in sodium chloride 0.9 % 100 mL IVPB  Status:  Discontinued        2 g 200 mL/hr over 30 Minutes Intravenous Every 12 hours 09/05/22 1259 09/05/22 1824   09/05/22 2015  ampicillin (OMNIPEN) 2 g in sodium chloride 0.9 % 100 mL IVPB  Status:  Discontinued        2 g 300 mL/hr over 20 Minutes Intravenous Every 6 hours 09/05/22 1921 09/05/22 2125   09/05/22 2000  cefTRIAXone (ROCEPHIN) 2 g in sodium chloride 0.9 % 100 mL IVPB        2 g 200 mL/hr over 30 Minutes Intravenous Every 12 hours 09/05/22 1826 09/19/22 1959   09/05/22 2000  Ampicillin-Sulbactam (UNASYN) 3 g in sodium chloride 0.9 % 100 mL IVPB  Status:  Discontinued        3 g 200 mL/hr over 30 Minutes Intravenous Every 6 hours 09/05/22 1850 09/05/22 1921   09/05/22 2000  acyclovir (ZOVIRAX) 500 mg in dextrose 5 % 100 mL IVPB  Status:  Discontinued        500 mg 110 mL/hr over 60 Minutes Intravenous Every 12 hours 09/05/22 1857 09/05/22 2125   09/05/22 1115  ceFEPIme (MAXIPIME) 2 g in sodium chloride 0.9 % 100 mL IVPB        2 g 200 mL/hr over 30 Minutes Intravenous  Once 09/05/22 1113 09/05/22 1219   09/05/22 1115  metroNIDAZOLE (FLAGYL) IVPB 500 mg        500 mg 100 mL/hr over 60 Minutes Intravenous  Once 09/05/22 1113 09/05/22 1353   09/05/22 1115  vancomycin (VANCOCIN) IVPB 1000 mg/200 mL premix        1,000 mg 200 mL/hr over 60 Minutes Intravenous  Once 09/05/22 1113 09/05/22 1515       Medications: Scheduled Meds:  bictegravir-emtricitabine-tenofovir AF  1 tablet Oral Daily   Chlorhexidine Gluconate Cloth  6 each Topical Daily   enoxaparin (LOVENOX) injection  40 mg Subcutaneous Q24H   feeding supplement  237 mL Oral TID BM   melatonin  3 mg Oral QHS   sodium chloride flush  10-40 mL Intracatheter Q12H   thiamine  100 mg Oral Daily   Continuous Infusions:  sodium chloride Stopped (09/08/22 1007)   cefTRIAXone (ROCEPHIN)  IV 2 g (09/11/22 0751)    PRN Meds:.mouth rinse, sodium chloride flush    Objective: Weight change:   Intake/Output Summary (Last 24 hours) at 09/11/2022 1426 Last data filed at 09/11/2022 1300 Gross per 24 hour  Intake 700 ml  Output 475 ml  Net 225 ml   Blood pressure 108/81, pulse 91, temperature 98.3 F (36.8 C), temperature source Oral, resp. rate 17, weight 52.8 kg, SpO2 100 %. Temp:  [98.3 F (36.8 C)-99.5 F (37.5 C)] 98.3 F (36.8 C) (07/07 1142) Pulse Rate:  [88-94] 91 (07/07 1142) Resp:  [17-19] 17 (07/07 1142) BP: (108-122)/(81-87) 108/81 (07/07 1142) SpO2:  [98 %-100 %] 100 % (07/07 1142)  Physical Exam: Physical Exam Constitutional:      Appearance: He is well-developed.  HENT:     Head: Normocephalic and atraumatic.  Eyes:     Conjunctiva/sclera: Conjunctivae normal.  Cardiovascular:     Rate and Rhythm: Normal rate and regular rhythm.  Pulmonary:     Effort: Pulmonary effort is normal. No respiratory distress.     Breath sounds: No wheezing.  Abdominal:     General: There is no distension.     Palpations: Abdomen is soft.  Musculoskeletal:        General: Normal range of motion.     Cervical back: Normal range of motion and neck supple.  Skin:    General: Skin is warm and dry.     Findings: No erythema or rash.  Neurological:     General: No focal deficit present.     Mental Status: He is alert and oriented to person, place, and time.  Psychiatric:        Attention and Perception: Attention normal.        Mood and Affect: Affect is flat.        Speech: Speech is delayed.        Behavior: Behavior normal. Behavior is cooperative.        Cognition and Memory: He exhibits impaired remote memory.      CBC:    BMET Recent Labs    09/09/22 1324 09/10/22 0930  NA 133* 131*  K 3.7 3.9  CL 100 97*  CO2 24 24  GLUCOSE 99 76  BUN 7* 10  CREATININE 0.54* 0.65  CALCIUM 7.6* 7.4*     Liver Panel  No results for input(s): "PROT", "ALBUMIN", "AST", "ALT",  "ALKPHOS", "BILITOT", "BILIDIR", "IBILI" in the last 72 hours.     Sedimentation Rate No results for input(s): "ESRSEDRATE" in the last 72 hours. C-Reactive Protein No results for input(s): "CRP" in the last 72 hours.  Micro Results: Recent Results (from the past 720 hour(s))  Resp panel by RT-PCR (RSV, Flu A&B, Covid) Anterior Nasal Swab     Status: None   Collection Time: 08/13/22  4:12 PM   Specimen: Anterior Nasal Swab  Result Value Ref Range Status   SARS Coronavirus 2 by RT PCR NEGATIVE NEGATIVE Final   Influenza A by PCR NEGATIVE NEGATIVE Final   Influenza B by PCR NEGATIVE NEGATIVE Final    Comment: (NOTE) The Xpert Xpress SARS-CoV-2/FLU/RSV plus assay is intended as an aid in the diagnosis of influenza from Nasopharyngeal swab specimens and should not be used as a sole basis for treatment. Nasal washings and aspirates are unacceptable for Xpert Xpress SARS-CoV-2/FLU/RSV testing.  Fact Sheet for Patients: BloggerCourse.com  Fact Sheet for Healthcare Providers: SeriousBroker.it  This test is not yet approved or cleared by the [place] FDA and has been authorized for detection and/or diagnosis of SARS-CoV-2 by FDA under an Emergency Use Authorization (EUA). This EUA will remain in effect (meaning this test can be used) for the duration of the COVID-19 declaration under Section 564(b)(1) of the Act, 21 U.S.C. section 360bbb-3(b)(1), unless the authorization is terminated or revoked.     Resp Syncytial Virus by PCR NEGATIVE NEGATIVE Final    Comment: (NOTE) Fact Sheet for Patients: BloggerCourse.com  Fact Sheet for Healthcare Providers: SeriousBroker.it  This test is not yet approved or cleared by the [place] FDA and has been authorized for detection and/or diagnosis of SARS-CoV-2 by FDA under an Emergency Use Authorization (EUA). This EUA will remain in  effect (meaning this test can be used) for the duration of the COVID-19 declaration under Section 564(b)(1) of the Act, 21 U.S.C. section 360bbb-3(b)(1), unless the authorization is terminated or revoked.  Performed at [facility], [address], [place], [place] [address]   Group A Strep by PCR     Status: None   Collection Time: 08/31/22  1:01 PM   Specimen: Throat; Sterile Swab  Result Value Ref Range Status   Group A Strep by PCR NOT DETECTED NOT DETECTED Final    Comment: Performed at [facility], [address], [place], [place] [address]  Resp panel by RT-PCR (RSV, Flu A&B, Covid) Throat     Status: None   Collection Time: 08/31/22  1:01 PM   Specimen: Throat; Nasal Swab  Result Value Ref Range Status   SARS Coronavirus 2 by RT PCR NEGATIVE NEGATIVE Final   Influenza A by PCR NEGATIVE NEGATIVE Final   Influenza B by PCR NEGATIVE NEGATIVE Final    Comment: (NOTE) The Xpert Xpress SARS-CoV-2/FLU/RSV plus assay is intended as an aid in the diagnosis of influenza from Nasopharyngeal swab specimens and should not be used as a sole basis for treatment. Nasal washings and aspirates are unacceptable for Xpert Xpress SARS-CoV-2/FLU/RSV testing.  Fact Sheet for Patients: BloggerCourse.com  Fact Sheet for Healthcare Providers: SeriousBroker.it  This test is not yet approved or cleared by the [place] FDA and has been authorized for detection and/or diagnosis of SARS-CoV-2 by FDA under an Emergency Use Authorization (EUA).  This EUA will remain in effect (meaning this test can be used) for the duration of the COVID-19 declaration under Section 564(b)(1) of the Act, 21 U.S.C. section 360bbb-3(b)(1), unless the authorization is terminated or revoked.     Resp Syncytial Virus by PCR NEGATIVE NEGATIVE Final    Comment: (NOTE) Fact Sheet for Patients: BloggerCourse.com  Fact Sheet for  Healthcare Providers: SeriousBroker.it  This test is not yet approved or cleared by the [place] FDA and has been authorized for detection and/or diagnosis of SARS-CoV-2 by FDA under an Emergency Use Authorization (EUA). This EUA will remain in effect (meaning this test can be used) for the duration of the COVID-19 declaration under Section 564(b)(1) of the Act, 21 U.S.C. section 360bbb-3(b)(1), unless the authorization is terminated or revoked.  Performed at [facility], [address], [place], [place] [address]   Resp panel by RT-PCR (RSV, Flu A&B, Covid) Anterior Nasal Swab     Status: None   Collection Time: 09/05/22 11:13 AM   Specimen: Anterior Nasal Swab  Result Value Ref Range Status   SARS Coronavirus 2 by RT PCR NEGATIVE NEGATIVE Final   Influenza A by PCR NEGATIVE NEGATIVE Final   Influenza B by PCR NEGATIVE NEGATIVE Final    Comment: (NOTE) The Xpert Xpress SARS-CoV-2/FLU/RSV plus assay is intended as an aid in the diagnosis of influenza from Nasopharyngeal swab specimens and should not be used as a sole basis for treatment. Nasal washings and aspirates are unacceptable for Xpert Xpress SARS-CoV-2/FLU/RSV testing.  Fact Sheet for Patients: BloggerCourse.com  Fact Sheet for Healthcare Providers: SeriousBroker.it  This test is not yet approved or cleared by the [place] FDA and has been authorized for detection and/or diagnosis of SARS-CoV-2 by FDA under an Emergency Use Authorization (EUA). This EUA will remain in effect (meaning this test can be used) for the duration of the COVID-19 declaration under Section 564(b)(1) of the Act, 21 U.S.C. section 360bbb-3(b)(1), unless the authorization is terminated or revoked.     Resp Syncytial Virus by PCR NEGATIVE NEGATIVE Final    Comment: (NOTE) Fact Sheet for Patients: BloggerCourse.com  Fact Sheet  for Healthcare Providers: SeriousBroker.it  This test is not yet approved or cleared by the [place] FDA and has been authorized for detection and/or diagnosis of SARS-CoV-2 by FDA under an Emergency Use Authorization (EUA). This EUA will remain in effect (meaning this test can be used) for the duration of the COVID-19 declaration under Section 564(b)(1) of the Act, 21 U.S.C. section 360bbb-3(b)(1), unless the authorization is terminated or revoked.  Performed at [facility], [address] [place], [place] [address]   Blood Culture (routine x 2)     Status: Abnormal   Collection Time: 09/05/22 11:13 AM   Specimen: BLOOD RIGHT ARM  Result Value Ref Range Status   Specimen Description BLOOD RIGHT ARM  Final   Special Requests   Final    BOTTLES DRAWN AEROBIC AND ANAEROBIC Blood Culture results may not be optimal due to an excessive volume of blood received in culture bottles   Culture  Setup Time   Final    GRAM POSITIVE COCCI IN BOTH AEROBIC AND ANAEROBIC BOTTLES CRITICAL RESULT CALLED TO, READ BACK BY AND VERIFIED WITH: PHARMD H. WILSON 09/06/22 @ 0036 BY AB Performed at [facility], [address], [place], [place] [address]    Culture STREPTOCOCCUS PNEUMONIAE (A)  Final   Report Status 09/08/2022 FINAL  Final  Organism ID, Bacteria STREPTOCOCCUS PNEUMONIAE  Final      Susceptibility   Streptococcus pneumoniae - MIC*    ERYTHROMYCIN >=8 RESISTANT Resistant     LEVOFLOXACIN 0.5 SENSITIVE Sensitive     VANCOMYCIN 0.25 SENSITIVE Sensitive     PENICILLIN (meningitis) 0.5 RESISTANT Resistant     PENO - penicillin 0.5      PENICILLIN (non-meningitis) 0.5 SENSITIVE Sensitive     PENICILLIN (oral) 0.5 INTERMEDIATE Intermediate     CEFTRIAXONE (non-meningitis) 0.25 SENSITIVE Sensitive     CEFTRIAXONE (meningitis) 0.25 SENSITIVE Sensitive     * STREPTOCOCCUS PNEUMONIAE  Blood Culture ID Panel (Reflexed)     Status: Abnormal   Collection  Time: 09/05/22 11:13 AM  Result Value Ref Range Status   Enterococcus faecalis NOT DETECTED NOT DETECTED Final   Enterococcus Faecium NOT DETECTED NOT DETECTED Final   Listeria monocytogenes NOT DETECTED NOT DETECTED Final   Staphylococcus species NOT DETECTED NOT DETECTED Final   Staphylococcus aureus (BCID) NOT DETECTED NOT DETECTED Final   Staphylococcus epidermidis NOT DETECTED NOT DETECTED Final   Staphylococcus lugdunensis NOT DETECTED NOT DETECTED Final   Streptococcus species DETECTED (A) NOT DETECTED Final    Comment: CRITICAL RESULT CALLED TO, READ BACK BY AND VERIFIED WITH: PHARMD H. WILSON 09/06/22 @ 0036 BY AB    Streptococcus agalactiae NOT DETECTED NOT DETECTED Final   Streptococcus pneumoniae DETECTED (A) NOT DETECTED Final    Comment: CRITICAL RESULT CALLED TO, READ BACK BY AND VERIFIED WITH: PHARMD H. WILSON 09/06/22 @ 0036 BY AB    Streptococcus pyogenes NOT DETECTED NOT DETECTED Final   A.calcoaceticus-baumannii NOT DETECTED NOT DETECTED Final   Bacteroides fragilis NOT DETECTED NOT DETECTED Final   Enterobacterales NOT DETECTED NOT DETECTED Final   Enterobacter cloacae complex NOT DETECTED NOT DETECTED Final   Escherichia coli NOT DETECTED NOT DETECTED Final   Klebsiella aerogenes NOT DETECTED NOT DETECTED Final   Klebsiella oxytoca NOT DETECTED NOT DETECTED Final   Klebsiella pneumoniae NOT DETECTED NOT DETECTED Final   Proteus species NOT DETECTED NOT DETECTED Final   Salmonella species NOT DETECTED NOT DETECTED Final   Serratia marcescens NOT DETECTED NOT DETECTED Final   Haemophilus influenzae NOT DETECTED NOT DETECTED Final   Neisseria meningitidis NOT DETECTED NOT DETECTED Final   Pseudomonas aeruginosa NOT DETECTED NOT DETECTED Final   Stenotrophomonas maltophilia NOT DETECTED NOT DETECTED Final   Candida albicans NOT DETECTED NOT DETECTED Final   Candida auris NOT DETECTED NOT DETECTED Final   Candida glabrata NOT DETECTED NOT DETECTED Final   Candida  krusei NOT DETECTED NOT DETECTED Final   Candida parapsilosis NOT DETECTED NOT DETECTED Final   Candida tropicalis NOT DETECTED NOT DETECTED Final   Cryptococcus neoformans/gattii NOT DETECTED NOT DETECTED Final    Comment: Performed at [facility], [address] [place], [place] [address]  Blood Culture (routine x 2)     Status: None   Collection Time: 09/05/22  1:28 PM   Specimen: BLOOD LEFT HAND  Result Value Ref Range Status   Specimen Description BLOOD LEFT HAND  Final   Special Requests   Final    BOTTLES DRAWN AEROBIC AND ANAEROBIC Blood Culture results may not be optimal due to an inadequate volume of blood received in culture bottles   Culture   Final    NO GROWTH 5 DAYS Performed at [facility], [address], [place], [place] [address]    Report Status 09/10/2022 FINAL  Final  CSF culture w Gram Stain     Status: None   Collection Time: 09/05/22  5:56 PM   Specimen: CSF; Cerebrospinal Fluid  Result Value Ref Range Status   Specimen Description CSF  Final   Special Requests LP  Final   Gram Stain   Final    WBC PRESENT, PREDOMINANTLY PMN GRAM POSITIVE COCCI IN PAIRS CYTOSPIN SMEAR CRITICAL RESULT CALLED TO, READ BACK BY AND VERIFIED WITH: RN [place] ALLEN ON 09/05/22 @ 1853 BY DRT Performed at [facility], [address] [place], [place] [address]    Culture RARE STREPTOCOCCUS PNEUMONIAE  Final   Report Status 09/08/2022 FINAL  Final   Organism ID, Bacteria STREPTOCOCCUS PNEUMONIAE  Final      Susceptibility   Streptococcus pneumoniae - MIC*    ERYTHROMYCIN >=8 RESISTANT Resistant     LEVOFLOXACIN 0.5 SENSITIVE Sensitive     VANCOMYCIN 0.25 SENSITIVE Sensitive     PENICILLIN (meningitis) 0.5 RESISTANT Resistant     PENO - penicillin 0.5      PENICILLIN (non-meningitis) 0.5 SENSITIVE Sensitive     PENICILLIN (oral) 0.5 INTERMEDIATE Intermediate     CEFTRIAXONE (non-meningitis) 0.25 SENSITIVE Sensitive     CEFTRIAXONE (meningitis) 0.25  SENSITIVE Sensitive     * RARE STREPTOCOCCUS PNEUMONIAE  Culture, fungus without smear     Status: None (Preliminary result)   Collection Time: 09/05/22  5:56 PM   Specimen: CSF; Cerebrospinal Fluid  Result Value Ref Range Status   Specimen Description CSF  Final   Special Requests LP  Final   Culture   Final    NO FUNGUS ISOLATED AFTER 5 DAYS Performed at [facility], [address] [place], [place] [address]    Report Status PENDING  Incomplete  MRSA Next Gen by PCR, Nasal     Status: None   Collection Time: 09/05/22  6:41 PM   Specimen: Nasal Mucosa; Nasal Swab  Result Value Ref Range Status   MRSA by PCR Next Gen NOT DETECTED NOT DETECTED Final    Comment: (NOTE) The GeneXpert MRSA Assay (FDA approved for NASAL specimens only), is one component of a comprehensive MRSA colonization surveillance program. It is not intended to diagnose MRSA infection nor to guide or monitor treatment for MRSA infections. Test performance is not FDA approved in patients less than 34 years old. Performed at [facility], [address], [place], [place] [address]   Culture, blood (Routine X 2)     Status: None   Collection Time: 09/05/22  6:53 PM   Specimen: BLOOD  Result Value Ref Range Status   Specimen Description BLOOD RIGHT ANTECUBITAL  Final   Special Requests   Final    BOTTLES DRAWN AEROBIC AND ANAEROBIC Blood Culture adequate volume   Culture   Final    NO GROWTH 5 DAYS Performed at [facility], [address], [place], [place] [address]    Report Status 09/10/2022 FINAL  Final  Culture, blood (Routine X 2)     Status: None   Collection Time: 09/05/22  6:55 PM   Specimen: BLOOD  Result Value Ref Range Status   Specimen Description BLOOD SITE NOT SPECIFIED  Final   Special Requests   Final    BOTTLES DRAWN AEROBIC AND ANAEROBIC Blood Culture results may not be optimal due to an inadequate volume of blood received in culture bottles   Culture   Final    NO  GROWTH 5 DAYS  Performed at [facility], [address] [place], [place] [address]    Report Status 09/10/2022 FINAL  Final  Culture, blood (Routine X 2) w Reflex to ID Panel     Status: None   Collection Time: 09/06/22 11:40 PM   Specimen: BLOOD  Result Value Ref Range Status   Specimen Description BLOOD BLOOD RIGHT HAND  Final   Special Requests   Final    BOTTLES DRAWN AEROBIC AND ANAEROBIC Blood Culture adequate volume   Culture   Final    NO GROWTH 5 DAYS Performed at [facility], [address], [place], [place] [address]    Report Status 09/11/2022 FINAL  Final  Culture, blood (Routine X 2) w Reflex to ID Panel     Status: None   Collection Time: 09/06/22 11:40 PM   Specimen: BLOOD  Result Value Ref Range Status   Specimen Description BLOOD BLOOD LEFT HAND  Final   Special Requests   Final    BOTTLES DRAWN AEROBIC AND ANAEROBIC Blood Culture adequate volume   Culture   Final    NO GROWTH 5 DAYS Performed at [facility], [address], [place], [place] [address]    Report Status 09/11/2022 FINAL  Final    Studies/Results: No results found.    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   We will investigate further literature on IRIS to pneumococcus  Dr. Luciana Axe is back tomorrow.   LOS: 6 days   Christopher Farrell 09/11/2022, 2:26 PM

## 2022-09-12 ENCOUNTER — Inpatient Hospital Stay (HOSPITAL_COMMUNITY): Payer: Medicaid Other | Admitting: Certified Registered"

## 2022-09-12 ENCOUNTER — Inpatient Hospital Stay (HOSPITAL_COMMUNITY): Payer: Medicaid Other

## 2022-09-12 ENCOUNTER — Encounter (HOSPITAL_COMMUNITY)
Admission: EM | Disposition: A | Discharge status: Home Health | Payer: Self-pay | Source: Home / Self Care | Attending: Family Medicine

## 2022-09-12 ENCOUNTER — Other Ambulatory Visit (HOSPITAL_COMMUNITY): Payer: Self-pay

## 2022-09-12 DIAGNOSIS — B9681 Helicobacter pylori [H. pylori] as the cause of diseases classified elsewhere: Secondary | ICD-10-CM

## 2022-09-12 DIAGNOSIS — R4182 Altered mental status, unspecified: Secondary | ICD-10-CM

## 2022-09-12 DIAGNOSIS — I340 Nonrheumatic mitral (valve) insufficiency: Secondary | ICD-10-CM

## 2022-09-12 DIAGNOSIS — G988 Other disorders of nervous system: Secondary | ICD-10-CM

## 2022-09-12 DIAGNOSIS — I330 Acute and subacute infective endocarditis: Secondary | ICD-10-CM

## 2022-09-12 DIAGNOSIS — I679 Cerebrovascular disease, unspecified: Secondary | ICD-10-CM

## 2022-09-12 DIAGNOSIS — I5189 Other ill-defined heart diseases: Secondary | ICD-10-CM

## 2022-09-12 DIAGNOSIS — F418 Other specified anxiety disorders: Secondary | ICD-10-CM

## 2022-09-12 DIAGNOSIS — R401 Stupor: Secondary | ICD-10-CM

## 2022-09-12 HISTORY — PX: TEE WITHOUT CARDIOVERSION: SHX5443

## 2022-09-12 LAB — GLUCOSE, CAPILLARY
Glucose-Capillary: 106 mg/dL — ABNORMAL HIGH (ref 70–99)
Glucose-Capillary: 84 mg/dL (ref 70–99)
Glucose-Capillary: 104 mg/dL — ABNORMAL HIGH (ref 70–99)
Glucose-Capillary: 101 mg/dL — ABNORMAL HIGH (ref 70–99)

## 2022-09-12 LAB — CBC
HCT: 29 % — ABNORMAL LOW (ref 39.0–52.0)
Hemoglobin: 9.8 g/dL — ABNORMAL LOW (ref 13.0–17.0)
MCH: 28.9 pg (ref 26.0–34.0)
MCHC: 33.8 g/dL (ref 30.0–36.0)
MCV: 85.5 fL (ref 80.0–100.0)
Platelets: 322 10*3/uL (ref 150–400)
RBC: 3.39 MIL/uL — ABNORMAL LOW (ref 4.22–5.81)
RDW: 13.8 % (ref 11.5–15.5)
WBC: 8 10*3/uL (ref 4.0–10.5)
nRBC: 0 % (ref 0.0–0.2)

## 2022-09-12 LAB — ECHO TEE
MV M vel: 4.37 m/s
MV Peak grad: 76.4 mmHg
Radius: 1.25 cm

## 2022-09-12 LAB — BASIC METABOLIC PANEL
Anion gap: 6 (ref 5–15)
BUN: 5 mg/dL — ABNORMAL LOW (ref 8–23)
CO2: 25 mmol/L (ref 22–32)
Calcium: 7.6 mg/dL — ABNORMAL LOW (ref 8.9–10.3)
Chloride: 100 mmol/L (ref 98–111)
Creatinine, Ser: 0.6 mg/dL — ABNORMAL LOW (ref 0.61–1.24)
GFR, Estimated: >60 mL/min (ref >60)
Glucose, Bld: 91 mg/dL (ref 70–99)
Potassium: 3.6 mmol/L (ref 3.5–5.1)
Sodium: 131 mmol/L — ABNORMAL LOW (ref 135–145)

## 2022-09-12 SURGERY — ECHOCARDIOGRAM, TRANSESOPHAGEAL
Anesthesia: Monitor Anesthesia Care

## 2022-09-12 MED ORDER — PROPOFOL 500 MG/50ML IV EMUL
INTRAVENOUS | Status: DC | PRN
  Administered 2022-09-12: 125 ug/kg/min via INTRAVENOUS

## 2022-09-12 MED ORDER — SODIUM CHLORIDE 0.9 % IV SOLN: INTRAVENOUS | Status: DC

## 2022-09-12 MED ORDER — PROPOFOL 10 MG/ML IV BOLUS
INTRAVENOUS | Status: DC | PRN
  Administered 2022-09-12: 30 mg via INTRAVENOUS
  Administered 2022-09-12: 20 mg via INTRAVENOUS
  Administered 2022-09-12: 50 mg via INTRAVENOUS

## 2022-09-12 MED ORDER — PHENYLEPHRINE 80 MCG/ML (10ML) SYRINGE FOR IV PUSH (FOR BLOOD PRESSURE SUPPORT)
PREFILLED_SYRINGE | INTRAVENOUS | Status: DC | PRN
  Administered 2022-09-12: 160 ug via INTRAVENOUS

## 2022-09-12 NOTE — Progress Notes (Signed)
  Echocardiogram Echocardiogram Transesophageal has been performed.  Christopher Farrell 09/12/2022, 1:57 PM

## 2022-09-12 NOTE — Progress Notes (Addendum)
   Received sign out from covering provider on Friday that patient is scheduled for TEE today with Dr. Royann Shivers, needing orders/consent. [facility] has been requested to perform a transesophageal echocardiogram on Christopher Farrell for bacteremia and suspected endocarditis.  When I spoke with the patient he was alert and oriented to self, place, and July, but did not know the year. He was admitted with meningitis as well. Therefore, with his permission, I reached out to his mother Christopher Farrell by phone to confirm consent. After careful review of history and examination, the risks and benefits of transesophageal echocardiogram have been explained including risks of esophageal damage, perforation (1:10,000 risk), bleeding, pharyngeal hematoma as well as other potential complications associated with anesthesia including aspiration, arrhythmia, respiratory failure and death. Alternatives to treatment were discussed, questions were answered. Patient and mother Christopher Farrell agree they are willing for him to proceed. Harriett Meulendyke RN witnessed the consent with me. I delivered the consent to 3w and put it on his shadow chart and let the secretary know to notify the nurse who was presently with anothr patient. Patient had order for NPO after MN except sips with meds and ice chips. He confirms by phone that he did not have any ice chips. I also confirmed with Tiffany on 3w that the patient didn't have any ice chips. Order was updated to reflect NPO except sips with meds. Procedure is scheduled for 1345 with Dr. Royann Shivers.  Laurann Montana, PA-C 09/12/2022 7:02 AM

## 2022-09-12 NOTE — Plan of Care (Signed)
  Problem: Metabolic: Goal: Ability to maintain appropriate glucose levels will improve Outcome: Progressing   Problem: Skin Integrity: Goal: Risk for impaired skin integrity will decrease Outcome: Progressing   Problem: Education: Goal: Knowledge of General Education information will improve Description: Including pain rating scale, medication(s)/side effects and non-pharmacologic comfort measures Outcome: Progressing   Problem: Clinical Measurements: Goal: Respiratory complications will improve Outcome: Progressing Goal: Cardiovascular complication will be avoided Outcome: Progressing   Problem: Activity: Goal: Risk for activity intolerance will decrease Outcome: Progressing   Problem: Coping: Goal: Level of anxiety will decrease Outcome: Progressing   Problem: Elimination: Goal: Will not experience complications related to bowel motility Outcome: Progressing Goal: Will not experience complications related to urinary retention Outcome: Progressing

## 2022-09-12 NOTE — Progress Notes (Signed)
Patient off the unit for TEE.

## 2022-09-12 NOTE — Progress Notes (Signed)
STROKE TEAM PROGRESS NOTE   SUBJECTIVE (INTERVAL HISTORY) NT is at the bedside. Pt reclining in bed, awake alert, still mildly lethargic and psychomotor slowing.  TTE done this morning showed Large vegetation of the anterior mitral leaflet. This is mostly fixed, but has mobile components. There is an associated perforation of the anterior leaflet with severe eccentric mitral insufficiency, with a highly eccentric posteriorly-directed jet.    OBJECTIVE Temp:  [97.8 F (36.6 C)-99.6 F (37.6 C)] 98.3 F (36.8 C) (07/08 1555) Pulse Rate:  [79-101] 91 (07/08 1555) Cardiac Rhythm: Normal sinus rhythm (07/08 0704) Resp:  [0-23] 14 (07/08 1555) BP: (89-113)/(70-85) 91/71 (07/08 1555) SpO2:  [94 %-100 %] 99 % (07/08 1555)  Recent Labs  Lab 09/11/22 1630 09/11/22 2124 09/12/22 0616 09/12/22 1125 09/12/22 1700  GLUCAP 133* 102* 106* 84 104*   Recent Labs  Lab 09/06/22 0512 09/06/22 1642 09/07/22 0700 09/07/22 1634 09/08/22 0536 09/09/22 1324 09/10/22 0930 09/12/22 0630  NA 134*  --  138 138 141 133* 131* 131*  K 3.4*  --  6.1* 4.0 3.3* 3.7 3.9 3.6  CL 106  --  112* 107 104 100 97* 100  CO2 19*  --  19* 23 25 24 24 25   GLUCOSE 98  --  100* 122* 97 99 76 91  BUN 11  --  14 10 9  7* 10 5*  CREATININE 0.63  --  0.65 0.59* 0.63 0.54* 0.65 0.60*  CALCIUM 7.3*  --  7.5* 7.7* 7.6* 7.6* 7.4* 7.6*  MG 2.1 2.1 2.4 2.2 2.0  --   --   --   PHOS 3.3 2.7 2.9 1.8* 1.7* 3.4  --   --    No results for input(s): "AST", "ALT", "ALKPHOS", "BILITOT", "PROT", "ALBUMIN" in the last 168 hours.  Recent Labs  Lab 09/05/22 1925 09/06/22 1000 09/08/22 0536 09/10/22 0930 09/12/22 0630  WBC  --  14.4* 10.6* 9.7 8.0  HGB 10.9* 9.6* 9.3* 9.9* 9.8*  HCT 32.0* 27.7* 27.9* 27.9* 29.0*  MCV  --  89.4 87.5 87.2 85.5  PLT  --  137* 187 275 322   No results for input(s): "CKTOTAL", "CKMB", "CKMBINDEX", "TROPONINI" in the last 168 hours. No results for input(s): "LABPROT", "INR" in the last 72 hours. No  results for input(s): "COLORURINE", "LABSPEC", "PHURINE", "GLUCOSEU", "HGBUR", "BILIRUBINUR", "KETONESUR", "PROTEINUR", "UROBILINOGEN", "NITRITE", "LEUKOCYTESUR" in the last 72 hours.  Invalid input(s): "APPERANCEUR"     Component Value Date/Time   CHOL 58 09/07/2022 0927   TRIG 122 09/07/2022 0927   TRIG 11 02/14/2008 0000   HDL <10 (L) 09/07/2022 0927   CHOLHDL NOT CALCULATED 09/07/2022 0927   VLDL 24 09/07/2022 0927   LDLCALC NOT CALCULATED 09/07/2022 0927   LDLCALC 62 06/29/2021 1010   Lab Results  Component Value Date   HGBA1C 6.4 (H) 09/06/2022      Component Value Date/Time   LABOPIA NONE DETECTED 09/06/2022 0655   COCAINSCRNUR NONE DETECTED 09/06/2022 0655   LABBENZ NONE DETECTED 09/06/2022 0655   AMPHETMU NONE DETECTED 09/06/2022 0655   THCU POSITIVE (A) 09/06/2022 0655   LABBARB NONE DETECTED 09/06/2022 0655    No results for input(s): "ETH" in the last 168 hours.  I have personally reviewed the radiological images below and agree with the radiology interpretations.  ECHO TEE  Result Date: 09/12/2022    TRANSESOPHOGEAL ECHO REPORT   Patient Name:   Christopher Farrell Date of Exam: 09/12/2022 Medical Rec #:  [id number]  Height:       64.0 in Accession #:    [id number]         Weight:       116.4 lb Date of Birth:  [birth year]          BSA:          1.554 m Patient Age:    61 years           BP:           101/79 mmHg Patient Gender: M                  HR:           92 bpm. Exam Location:  Inpatient Procedure: 3D Echo, Transesophageal Echo, Cardiac Doppler and Color Doppler Indications:     Endocarditis  History:         Patient has prior history of Echocardiogram examinations, most                  recent 09/06/2022. Stroke, Endocarditis and Mitral Valve Disease,                  Signs/Symptoms:Dyspnea, Shortness of Breath, Bacteremia and                  Altered Mental Status; Risk Factors:Current Smoker.  Sonographer:     Sheralyn Boatman RDCS Referring Phys:  [id number] Tacey Ruiz DUNN  Diagnosing Phys: Thurmon Fair MD PROCEDURE: After discussion of the risks and benefits of a TEE, an informed consent was obtained from a family member. The transesophogeal probe was passed without difficulty through the esophogus of the patient. Imaged were obtained with the patient in a left lateral decubitus position. Sedation performed by different physician. The patient was monitored while under deep sedation. Anesthestetic sedation was provided intravenously by Anesthesiology: 179mg  of Propofol. The patient's vital signs; including heart rate, blood pressure, and oxygen saturation; remained stable throughout the procedure. The patient developed no complications during the procedure.  IMPRESSIONS  1. Left ventricular ejection fraction, by estimation, is 65 to 70%. The left ventricle has normal function. The left ventricle has no regional wall motion abnormalities.  2. Right ventricular systolic function is normal. The right ventricular size is normal.  3. No left atrial/left atrial appendage thrombus was detected.  4. There is a multilobular vegetation attached to the anterior mitral valve leaflet that measures approximately 1.7x 0.85cm. The vegetation is broad based and mostly fixed, but has small mobile components. 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 There is severe mitral insufficiency directed laterally, towards the ostium of the left atrial appendage. By the PISA method, the ERO area is 0.8 cm sq, regurgitant volume 100 ml. The mitral valve is abnormal. Severe mitral valve regurgitation. No evidence of mitral stenosis.  5. The tricuspid valve is abnormal.  6. The aortic valve is tricuspid. Aortic valve regurgitation is not visualized. No aortic stenosis is present. FINDINGS  Left Ventricle: Left ventricular ejection fraction, by estimation, is 65 to 70%. The left ventricle has normal function. The left ventricle has no regional wall motion abnormalities. The left  ventricular internal cavity size was normal in size. There is  no left ventricular hypertrophy. Right Ventricle: The right ventricular size is normal. No increase in right ventricular wall thickness. Right ventricular systolic function is normal. Left Atrium: Left atrial size was normal in size. No left atrial/left atrial appendage thrombus was detected. Right Atrium: Right atrial size  was normal in size. Pericardium: Trivial pericardial effusion is present. The pericardial effusion is anterior to the right ventricle. Mitral Valve: There is a multilobular vegetation attached to the anterior mitral valve leaflet that measures approximately 1.7x 0.85cm. The vegetation is broad based and mostly fixed, but has small mobile components. There is an associated pseudoaneurysm  and perforation of the anterior leaflet. There is severe mitral insufficiency directed laterally, towards the ostium of the left atrial appendage. By the PISA method, the ERO area is 0.8 cm sq, regurgitant volume 100 ml. The mitral valve is abnormal. Severe mitral valve regurgitation, with eccentric laterally directed jet. No evidence of mitral valve stenosis. Tricuspid Valve: The tricuspid valve is abnormal. Tricuspid valve regurgitation is mild. Aortic Valve: The aortic valve is tricuspid. Aortic valve regurgitation is not visualized. No aortic stenosis is present. Pulmonic Valve: The pulmonic valve was normal in structure. Pulmonic valve regurgitation is not visualized. No evidence of pulmonic stenosis. Aorta: The aortic root and ascending aorta are structurally normal, with no evidence of dilitation. IAS/Shunts: No atrial level shunt detected by color flow Doppler. Additional Comments: Spectral Doppler performed. MR Peak grad:    76.4 mmHg    TRICUSPID VALVE MR Mean grad:    50.0 mmHg    TR Peak grad:   19.2 mmHg MR Vmax:         437.00 cm/s  TR Vmax:        219.00 cm/s MR Vmean:        330.0 cm/s MR PISA:         9.82 cm MR PISA Eff ROA: 83 mm  MR PISA Radius:  1.25 cm Thurmon Fair MD Electronically signed by Thurmon Fair MD Signature Date/Time: 09/12/2022/3:53:48 PM    Final    EP STUDY  Result Date: 09/12/2022 See surgical note for result.  [place] EKG SITE RITE  Result Date: 09/08/2022 If Site Rite image not attached, placement could not be confirmed due to current cardiac rhythm.  DG Abd Portable 1V  Result Date: 09/07/2022 CLINICAL DATA:  Feeding tube placement. EXAM: PORTABLE ABDOMEN - 1 VIEW COMPARISON:  Abdominal CT 09/05/2022. FINDINGS: 0912 hours. Feeding tube tip projects over the L4 vertebral body, likely in the mid to distal stomach. The bowel gas pattern appears nonobstructive. Mild atelectasis at both lung bases. Telemetry leads overlie the abdomen and lower chest. IMPRESSION: Feeding tube tip projects over the mid to distal stomach. Electronically Signed   By: Carey Bullocks M.D.   On: 09/07/2022 09:20   CT ANGIO HEAD NECK W WO CM  Result Date: 09/07/2022 CLINICAL DATA:  Neuro deficit with acute stroke suspected EXAM: CT ANGIOGRAPHY HEAD AND NECK WITH AND WITHOUT CONTRAST TECHNIQUE: Multidetector CT imaging of the head and neck was performed using the standard protocol during bolus administration of intravenous contrast. Multiplanar CT image reconstructions and MIPs were obtained to evaluate the vascular anatomy. Carotid stenosis measurements (when applicable) are obtained utilizing NASCET criteria, using the distal internal carotid diameter as the denominator. RADIATION DOSE REDUCTION: This exam was performed according to the departmental dose-optimization program which includes automated exposure control, adjustment of the mA and/or kV according to patient size and/or use of iterative reconstruction technique. 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  There is extensive artifact from EEG leads, no evidence of hemorrhage or progression. No hydrocephalus  or shift Vascular: No hyperdense vessel or unexpected calcification. Skull: Normal. Negative for fracture or focal lesion. Sinuses/Orbits: No acute finding. Review of the MIP images confirms the above findings CTA NECK FINDINGS Aortic arch: Atheromatous calcification of the aorta with 3 vessel branching. Right carotid system: Vessels are smoothly contoured and widely patent. Left carotid system: ICA tortuosity. Vessels are smoothly contoured and widely patent. Vertebral arteries: Proximal subclavian atherosclerosis. The vertebral arteries are mildly tortuous. No stenosis, beading, or dissection. Skeleton: Ordinary cervical spine degeneration. Other neck: No acute or aggressive finding Upper chest: Clear apical lungs when accounting for artifact Review of the MIP images confirms the above findings CTA HEAD FINDINGS Anterior circulation: No significant stenosis, proximal occlusion, aneurysm, or vascular malformation. Posterior circulation: No significant stenosis, proximal occlusion, aneurysm, or vascular malformation. Venous sinuses: Diffusely patent Anatomic variants: None significant Review of the MIP images confirms the above findings IMPRESSION: No emergent vascular finding. No flow limiting stenosis or ulceration of major arteries in the head and neck. Electronically Signed   By: Tiburcio Pea M.D.   On: 09/07/2022 04:10   ECHOCARDIOGRAM COMPLETE  Result Date: 09/06/2022    ECHOCARDIOGRAM REPORT   Patient Name:   PIER YOOS Date of Exam: 09/06/2022 Medical Rec #:  [id number]          Height:       64.0 in Accession #:    [id number]         Weight:       115.1 lb Date of Birth:  [birth year]          BSA:          1.547 m Patient Age:    61 years           BP:           92/61 mmHg Patient Gender: M                  HR:           63 bpm. Exam Location:  Inpatient Procedure: 2D Echo, Cardiac Doppler and  Color Doppler Indications:    Other cardiac sounds R01.2  History:        Patient has no prior history of Echocardiogram examinations.                 Signs/Symptoms:Shortness of Breath; Risk Factors:Current Smoker.  Sonographer:    Lucendia Herrlich Referring Phys: [id number] LUKE SEMINARA IMPRESSIONS  1. Left ventricular ejection fraction, by estimation, is 60 to 65%. The left ventricle has normal function. The left ventricle has no regional wall motion abnormalities. Left ventricular diastolic parameters were normal.  2. Right ventricular systolic function is normal. The right ventricular size is normal.  3. There is a solid appearing mass on the atrial side of the mid/distal anterior leaflet Maximum diameter 1.5 cm Consider f/u TEE and cardiac MRI to further assess. The mitral valve is abnormal. Trivial mitral valve regurgitation. No evidence of mitral stenosis.  4. The aortic valve is tricuspid. There is mild calcification of the aortic valve. There is mild thickening of the aortic valve. Aortic valve regurgitation is not visualized. Aortic valve sclerosis is present, with no evidence of aortic valve stenosis.  5. The inferior vena cava is dilated in size with >50% respiratory variability, suggesting right atrial pressure of 8 mmHg. FINDINGS  Left Ventricle: Left ventricular ejection fraction, by estimation, is 60 to 65%. The left ventricle has normal function. The left ventricle has no regional  wall motion abnormalities. The left ventricular internal cavity size was normal in size. There is  no left ventricular hypertrophy. Left ventricular diastolic parameters were normal. Right Ventricle: The right ventricular size is normal. No increase in right ventricular wall thickness. Right ventricular systolic function is normal. Left Atrium: Left atrial size was normal in size. Right Atrium: Right atrial size was normal in size. Pericardium: There is no evidence of pericardial effusion. Mitral Valve: There is a solid  appearing mass on the atrial side of the mid/distal anterior leaflet Maximum diameter 1.5 cm Consider f/u TEE and cardiac MRI to further assess. The mitral valve is abnormal. There is mild thickening of the mitral valve leaflet(s). There is mild calcification of the mitral valve leaflet(s). Trivial mitral valve regurgitation. No evidence of mitral valve stenosis. Tricuspid Valve: The tricuspid valve is normal in structure. Tricuspid valve regurgitation is not demonstrated. No evidence of tricuspid stenosis. Aortic Valve: The aortic valve is tricuspid. There is mild calcification of the aortic valve. There is mild thickening of the aortic valve. Aortic valve regurgitation is not visualized. Aortic valve sclerosis is present, with no evidence of aortic valve stenosis. Aortic valve peak gradient measures 5.7 mmHg. Pulmonic Valve: The pulmonic valve was normal in structure. Pulmonic valve regurgitation is not visualized. No evidence of pulmonic stenosis. Aorta: The aortic root is normal in size and structure. Venous: The inferior vena cava is dilated in size with greater than 50% respiratory variability, suggesting right atrial pressure of 8 mmHg. IAS/Shunts: The interatrial septum appears to be lipomatous. No atrial level shunt detected by color flow Doppler.  LEFT VENTRICLE PLAX 2D LVIDd:         2.32 cm   Diastology LVIDs:         2.36 cm   LV e' medial:    11.80 cm/s LV PW:         0.89 cm   LV E/e' medial:  8.2 LV IVS:        0.42 cm   LV e' lateral:   12.70 cm/s LVOT diam:     2.00 cm   LV E/e' lateral: 7.6 LV SV:         58 LV SV Index:   37 LVOT Area:     3.14 cm  RIGHT VENTRICLE            IVC RV S prime:     8.24 cm/s  IVC diam: 2.20 cm TAPSE (M-mode): 2.2 cm LEFT ATRIUM             Index        RIGHT ATRIUM           Index LA diam:        3.60 cm 2.33 cm/m   RA Area:     12.80 cm LA Vol (A2C):   38.8 ml 25.08 ml/m  RA Volume:   26.40 ml  17.07 ml/m LA Vol (A4C):   40.1 ml 25.92 ml/m LA Biplane Vol: 39.1  ml 25.28 ml/m  AORTIC VALVE AV Area (Vmax): 2.64 cm AV Vmax:        119.00 cm/s AV Peak Grad:   5.7 mmHg LVOT Vmax:      100.00 cm/s LVOT Vmean:     61.100 cm/s LVOT VTI:       0.184 m  AORTA Ao Root diam: 3.00 cm Ao Asc diam:  3.10 cm MITRAL VALVE  TRICUSPID VALVE MV Area (PHT): 4.68 cm    TR Peak grad:   18.3 mmHg MV Decel Time: 162 msec    TR Vmax:        214.00 cm/s MV E velocity: 97.10 cm/s MV A velocity: 75.70 cm/s  SHUNTS MV E/A ratio:  1.28        Systemic VTI:  0.18 m                            Systemic Diam: 2.00 cm Charlton Haws MD Electronically signed by Charlton Haws MD Signature Date/Time: 09/06/2022/12:57:44 PM    Final    Overnight EEG with video  Result Date: 09/06/2022 Charlsie Quest, MD     09/07/2022 10:54 AM Patient Name: MYCA LAOS MRN: [id number] Epilepsy Attending: Charlsie Quest Referring Physician/Provider: [name], MD Duration: 09/05/2022 2036 to 09/06/2022 2036 Patient history: 62yo M with seizure getting eeg to evaluate for seizure. Level of alertness: comatose AEDs during EEG study: Propofol Technical aspects: This EEG study was done with scalp electrodes positioned according to the 10-20 International system of electrode placement. Electrical activity was reviewed with band pass filter of 1-70Hz , sensitivity of 7 uV/mm, display speed of 21mm/sec with a 60Hz  notched filter applied as appropriate. EEG data were recorded continuously and digitally stored.  Video monitoring was available and reviewed as appropriate. Description: EEG showed continuous generalized 3 to 7 Hz theta-delta slowing admixed with intermittent generalized 13-15hz  beta activity. Hyperventilation and photic stimulation were not performed.   ABNORMALITY - Continuous slow, generalized IMPRESSION: This study is suggestive of severe diffuse encephalopathy, nonspecific etiology but likely related to sedation. No seizures or epileptiform discharges were seen throughout the recording. Charlsie Quest   MR BRAIN W WO CONTRAST  Result Date: 09/06/2022 CLINICAL DATA:  Meningitis/CNS infection suspected. EXAM: MRI HEAD WITHOUT AND WITH CONTRAST TECHNIQUE: Multiplanar, multiecho pulse sequences of the brain and surrounding structures were obtained without and with intravenous contrast. CONTRAST:  5mL GADAVIST GADOBUTROL 1 MMOL/ML IV SOLN COMPARISON:  None Available. FINDINGS: Brain: Multifocal abnormal diffusion restriction within the right greater than left hemispheres. The largest lesions are located in the right parietal and occipital lobes. There are punctate foci of acute ischemia within the contralateral hemisphere and left cerebellum. No acute or chronic hemorrhage. There is multifocal hyperintense T2-weighted signal within the white matter. Parenchymal volume and CSF spaces are normal. The midline structures are normal. There is no abnormal contrast enhancement. Vascular: Major flow voids are preserved. Skull and upper cervical spine: Normal calvarium and skull base. Visualized upper cervical spine and soft tissues are normal. Sinuses/Orbits:No paranasal sinus fluid levels or advanced mucosal thickening. No mastoid or middle ear effusion. Normal orbits. IMPRESSION: Multifocal acute/subacute ischemia within the right greater than left cerebral hemispheres and left cerebellum. The largest lesions are located in the right parietal and occipital lobes. No hemorrhage or mass effect. Electronically Signed   By: Deatra Robinson M.D.   On: 09/06/2022 01:57   DG CHEST PORT 1 VIEW  Result Date: 09/05/2022 CLINICAL DATA:  OG tube placement EXAM: PORTABLE CHEST 1 VIEW COMPARISON:  Chest radiograph dated 09/05/2022 FINDINGS: Upper lungs are excluded from the field of view. Enteric tube terminates in the proximal gastric body. IMPRESSION: Enteric tube terminates in the proximal gastric body. Electronically Signed   By: Charline Bills M.D.   On: 09/05/2022 22:05   DG Chest Port 1 View  Result Date:  09/05/2022  CLINICAL DATA:  ETT placement EXAM: PORTABLE CHEST 1 VIEW COMPARISON:  09/05/2022 at 1149 hours FINDINGS: Endotracheal tube terminates 6.5 cm above the carina. Mild patchy right upper lobe and right lower lobe pneumonia. Left lung is clear. No pleural effusion or pneumothorax. The heart is normal in size. IMPRESSION: Endotracheal tube terminates 6.5 cm above the carina. Mild patchy right lung pneumonia, as above. Electronically Signed   By: Charline Bills M.D.   On: 09/05/2022 19:21   CT ABDOMEN PELVIS W CONTRAST  Result Date: 09/05/2022 CLINICAL DATA:  Abdominal pain, unresponsive EXAM: CT ABDOMEN AND PELVIS WITH CONTRAST TECHNIQUE: Multidetector CT imaging of the abdomen and pelvis was performed using the standard protocol following bolus administration of intravenous contrast. RADIATION DOSE REDUCTION: This exam was performed according to the departmental dose-optimization program which includes automated exposure control, adjustment of the mA and/or kV according to patient size and/or use of iterative reconstruction technique. CONTRAST:  75mL OMNIPAQUE IOHEXOL 350 MG/ML SOLN COMPARISON:  None Available. FINDINGS: Examination is limited by breath motion artifact throughout. Lower chest: Please see separately reported examination of the chest Hepatobiliary: No solid liver abnormality is seen. No gallstones, gallbladder wall thickening, or biliary dilatation. Pancreas: Unremarkable. No pancreatic ductal dilatation or surrounding inflammatory changes. Spleen: Normal in size without significant abnormality. Adrenals/Urinary Tract: Adrenal glands are unremarkable. Kidneys are normal, without renal calculi, solid lesion, or hydronephrosis. Bladder is unremarkable. Stomach/Bowel: Stomach is within normal limits. Appendix appears normal. No evidence of bowel wall thickening, distention, or inflammatory changes. Colon is fluid-filled to the rectum. Vascular/Lymphatic: Aortic atherosclerosis. No enlarged  abdominal or pelvic lymph nodes. Reproductive: No mass or other significant abnormality. Other: No abdominal wall hernia or abnormality. No ascites. Musculoskeletal: No acute or significant osseous findings. IMPRESSION: 1. No acute CT findings of the abdomen or pelvis to explain abdominal pain. 2. Colon is fluid-filled to the rectum, suggestive of diarrheal illness. Aortic Atherosclerosis (ICD10-I70.0). Electronically Signed   By: Jearld Lesch M.D.   On: 09/05/2022 15:28   CT Angio Chest PE W/Cm &/Or Wo Cm  Result Date: 09/05/2022 CLINICAL DATA:  PE suspected, pneumonia EXAM: CT ANGIOGRAPHY CHEST WITH CONTRAST TECHNIQUE: Multidetector CT imaging of the chest was performed using the standard protocol during bolus administration of intravenous contrast. Multiplanar CT image reconstructions and MIPs were obtained to evaluate the vascular anatomy. RADIATION DOSE REDUCTION: This exam was performed according to the departmental dose-optimization program which includes automated exposure control, adjustment of the mA and/or kV according to patient size and/or use of iterative reconstruction technique. CONTRAST:  75mL OMNIPAQUE IOHEXOL 350 MG/ML SOLN COMPARISON:  None Available. FINDINGS: Cardiovascular: Satisfactory opacification of the pulmonary arteries to the segmental level. No evidence of pulmonary embolism. Normal heart size. Left coronary artery calcifications. No pericardial effusion. Aortic atherosclerosis. Mediastinum/Nodes: No enlarged mediastinal, hilar, or axillary lymph nodes. Thyroid gland, trachea, and esophagus demonstrate no significant findings. Lungs/Pleura: Centrilobular and paraseptal emphysema. Diffuse bilateral bronchial wall thickening. Extensive bronchiolar plugging. Dense, heterogeneous nodular and consolidative airspace opacity throughout the right lung, particularly in the inferior right upper lobe (series 4, image 62), although also seen in the lateral segment right middle lobe (series 4,  image 85). Lungs are clear. No pleural effusion or pneumothorax. Upper Abdomen: Please see separately reported examination of the abdomen. Musculoskeletal: No chest wall abnormality. No acute osseous findings. Review of the MIP images confirms the above findings. IMPRESSION: 1. Negative examination for pulmonary embolism. 2. Dense, heterogeneous nodular and consolidative airspace opacity throughout the right lung, particularly  in the inferior right upper lobe, although also seen in the lateral segment right middle lobe. Findings are consistent with multifocal infection. 3. Diffuse bilateral bronchial wall thickening and extensive bronchiolar plugging, consistent with nonspecific infectious or inflammatory bronchitis. 4. Emphysema. 5. Coronary artery disease. Aortic Atherosclerosis (ICD10-I70.0) and Emphysema (ICD10-J43.9). Electronically Signed   By: Jearld Lesch M.D.   On: 09/05/2022 15:22   CT HEAD WO CONTRAST ( )  Result Date: 09/05/2022 CLINICAL DATA:  Mental status change, unknown cause EXAM: CT HEAD WITHOUT CONTRAST TECHNIQUE: Contiguous axial images were obtained from the base of the skull through the vertex without intravenous contrast. RADIATION DOSE REDUCTION: This exam was performed according to the departmental dose-optimization program which includes automated exposure control, adjustment of the mA and/or kV according to patient size and/or use of iterative reconstruction technique. COMPARISON:  CT Head 03/26/13 FINDINGS: Brain: No evidence of acute infarction, hemorrhage, hydrocephalus, extra-axial collection or mass lesion/mass effect. Sequela of moderate chronic microvascular ischemic change. Vascular: No hyperdense vessel or unexpected calcification. Skull: Normal. Negative for fracture or focal lesion. Sinuses/Orbits: No middle ear or mastoid effusion. Chronic bilateral lamina papyracea fractures. Orbits are unremarkable. Other: None. IMPRESSION: No acute intracranial abnormality.  Electronically Signed   By: Lorenza Cambridge M.D.   On: 09/05/2022 15:22   DG Chest Port 1 View  Result Date: 09/05/2022 CLINICAL DATA:  Sepsis EXAM: PORTABLE CHEST 1 VIEW COMPARISON:  X-ray 08/31/2022 FINDINGS: Hyperinflation. No pneumothorax, effusion or edema. Normal cardiopericardial silhouette. Overlapping cardiac leads. There are some subtle opacity in the right midlung, inferior aspect of the right upper lobe. IMPRESSION: Hyperinflation. Persistent subtle right midlung opacity. Recommend follow-up Electronically Signed   By: Karen Kays M.D.   On: 09/05/2022 12:40   DG Chest 2 View  Result Date: 08/31/2022 CLINICAL DATA:  Chest radiograph dated August 13, 2022 EXAM: CHEST - 2 VIEW COMPARISON:  Chest radiograph dated August 13, 2022 FINDINGS: The heart size and mediastinal contours are within normal limits. Interval improvement of the right upper and middle lobe lung opacities. Persistent atelectasis of the right middle lobe with mild remaining hazy opacities of the right upper and middle lobe suggesting resolving pneumonia. No pleural effusion. The visualized skeletal structures are unremarkable. IMPRESSION: Interval improvement of the right upper and middle lobe lung opacities. Persistent atelectasis of the right middle lobe with mild remaining hazy opacities of the right upper and middle lobe suggesting resolving pneumonia. Electronically Signed   By: Larose Hires D.O.   On: 08/31/2022 14:32     PHYSICAL EXAM  Temp:  [97.8 F (36.6 C)-99.6 F (37.6 C)] 98.3 F (36.8 C) (07/08 1555) Pulse Rate:  [79-101] 91 (07/08 1555) Resp:  [0-23] 14 (07/08 1555) BP: (89-113)/(70-85) 91/71 (07/08 1555) SpO2:  [94 %-100 %] 99 % (07/08 1555)  General - Well nourished, well developed, in no apparent distress.  Ophthalmologic - fundi not visualized due to noncooperation.  Cardiovascular - Regular rhythm and rate.  Mental Status -  Level of arousal and orientation to month, place, and person were intact,  but not orientated to year Language including expression, naming, repetition, comprehension was assessed and found intact.  Mild psychomotor slowing Fund of Knowledge was assessed and was intact.  Cranial Nerves II - XII - II - Visual field intact OU. III, IV, VI - Extraocular movements intact. V - Facial sensation intact bilaterally. VII - Facial movement intact bilaterally. VIII - Hearing & vestibular intact bilaterally. X - Palate elevates symmetrically. XI - Chin turning &  shoulder shrug intact bilaterally. XII - Tongue protrusion intact.  Motor Strength - The patient's strength was symmetrical in all extremities and pronator drift was absent.  Bulk was normal and fasciculations were absent.   Motor Tone - Muscle tone was assessed at the neck and appendages and was normal.  Reflexes - The patient's reflexes were symmetrical in all extremities and he had no pathological reflexes.  Sensory - Light touch, temperature/pinprick were assessed and were symmetrical.    Coordination - The patient had normal movements in the hands with no ataxia or dysmetria.  Tremor was absent.  Gait and Station - deferred.   ASSESSMENT/PLAN [name] is a 62 y.o. male with history of HIV not on antiretrovirals who originally presented with pneumonia, cough and decreased responsiveness.  He also had several seizures shortly after presentation.  CSF and blood cultures were positive for strep pneumo, and TTE demonstrated mitral valve vegetation.  MRI brain shows multiple scattered strokes, likely from septic emboli.   Stroke - embolic shower, etiology likely cardioembolic in the setting of endocarditis CT head No acute abnormality.  CTA head & neck no LVO or hemodynamically significant stenosis MRI small acute and subacute infarcts in right greater than left cerebral hemispheres and left cerebellum 2D Echo EF 60 to 65%, 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 TEE  Large vegetation of the anterior mitral leaflet. This is mostly fixed, but has mobile components. There is an associated perforation of the anterior leaflet with severe eccentric mitral insufficiency, with a highly eccentric posteriorly-directed jet.  LDL 47 HgbA1c 6.4 VTE prophylaxis -Lovenox No antithrombotic prior to admission, now on no antithrombotics given endocarditis Therapy recommendations:  CIR Disposition: Pending   Seizure activity Patient had seizure activity upon arrival to the hospital in the setting of meningitis,  LTM showed no further seizure activity No AED now given provoked seizure Seizure procaution No driving until seizure-free for 6 months   Bacterial meningitis and strep pneumo bacteremia Pulmonary septic emboli Endocarditis Leukocytosis WBC 14.4->10.6 CSF and blood cultures positive for strep pneumo 2D Echo 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 TEE Large vegetation of the anterior mitral leaflet. This is mostly fixed, but has mobile components. There is an associated perforation of the anterior leaflet with severe eccentric mitral insufficiency, with a highly eccentric posteriorly-directed jet.  Continue Rocephin per ID May consider CVTS consultation   HIV infection Patient has history of HIV Not compliant with antiretrovirals at home Currently on Biktarvy Follow-up with ID as outpatient   Hypertension Home meds: None Stable Maintain normotension   Hyperlipidemia Home meds: None LDL 47, goal < 70 No statin needed given LDL at goal   Tobacco Abuse Patient smokes cigarettes but not every day tobacco cessation education provided Patient is waiting to create   Substance Abuse UDS positive for [facility]  cessation education provided Patient is willing to create   Dysphagia Patient has post-stroke dysphagia Now on dysphagia 1 and thin liquid. encourage p.o. intake Dietitian consult Add ensure tid Speech on board    Other Stroke Risk Factors     Other Active Problems   Hospital day # 7  Neurology will sign off. Please call with questions. Pt will follow up with stroke clinic NP at [facility] in about 4 weeks after discharge. Thanks for the consult.   Marvel Plan, MD PhD Stroke Neurology 09/12/2022 7:09 PM    To contact Stroke Continuity provider, please refer to  [email or web address]. After hours, contact General Neurology

## 2022-09-12 NOTE — Anesthesia Procedure Notes (Signed)
Procedure Name: MAC Date/Time: 09/12/2022 1:06 PM  Performed by: Gus Puma, CRNAPre-anesthesia Checklist: Patient identified, Suction available, Emergency Drugs available, Patient being monitored and Timeout performed Patient Re-evaluated:Patient Re-evaluated prior to induction Oxygen Delivery Method: Nasal cannula Induction Type: IV induction

## 2022-09-12 NOTE — Progress Notes (Signed)
     Daily Progress Note Intern Pager: [phone]  Patient name: Christopher Farrell Medical record number: [id number] Date of birth: 05/22/1960 Age: 62 y.o. Gender: male  Primary Care Provider: Pcp, No Consultants: Cardiology, CCM, ID, Neurology Code Status: Full Code  Pt Overview and Major Events to Date:  7/1: Admission and Intubation 7/4: Extubated 7/5 Transferred to FMTS     Assessment and Plan: Christopher Farrell is a 62 y.o. male presenting with meningitis due to Streptococcus pneumoniae. Also noted to have atrial mass on echo concerning for septic vegetation Pertinent PMH/PSH includes HIV not on antiretrovirals.   Hospital Problem List      Hospital     * (Principal) Meningitis due to Streptococcus pneumoniae     Stable, mental status appears to be at baseline - Continue Rocephin (8/14 days; 7/1-7/15) - Neurology consulted, appreciate recommendations - PT/OT/SLP - monitor BMP        Altered mental status     Appears closer to baseline. 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  LVEF 60-65% - TEE today, will follow results        Cerebral septic emboli (HCC)     - continue antibiotics      FEN/GI: NPO for TEE PPx: Lovenox Dispo: pending clinical improvement  Subjective:  Patient did well overnight and has no complaints this morning. He was working with OT this morning when seen in the room and had an episode of dizziness while trying to freshen up.   Objective: Temp:  [98 F (36.7 C)-99.6 F (37.6 C)] 98 F (36.7 C) (07/08 0721) Pulse Rate:  [86-101] 101 (07/08 0730) Resp:  [14-19] 14 (07/08 0721) BP: (89-108)/(70-85) 102/71 (07/08 0730) SpO2:  [98 %-100 %] 100 % (07/08 0332)  Physical Exam: General: NAD, awake and alert. Cardiovascular: RRR.  Mitral regurg murmur. Respiratory: CTAB, normal WOB on RA Abdomen: soft, nontender, nondistended Extremities: no BLE edema Neuro: A&Ox3. No focal neurological deficits.  Laboratory: Most recent CBC Lab Results  Component Value Date   WBC 8.0 09/12/2022   HGB 9.8 (L) 09/12/2022   HCT 29.0 (L) 09/12/2022   MCV 85.5 09/12/2022   PLT 322 09/12/2022   Most recent BMP    Latest Ref Rng & Units 09/12/2022    6:30 AM  BMP  Glucose 70 - 99 mg/dL 91   BUN 8 - 23 mg/dL 5   Creatinine 1.47 - 8.29 mg/dL 5.62   Sodium 130 - 865 mmol/L 131   Potassium 3.5 - 5.1 mmol/L 3.6   Chloride 98 - 111 mmol/L 100   CO2 22 - 32 mmol/L 25   Calcium 8.9 - 10.3 mg/dL 7.6     Imaging/Diagnostic Tests: No new imaging.  Fortunato Curling, DO 09/12/2022, 8:45 AM PGY-1, [facility]  FPTS Intern pager: [phone], text pages welcome Secure chat group [facility] Teaching Service

## 2022-09-12 NOTE — Assessment & Plan Note (Signed)
Stable, mental status appears to be at baseline - Continue Rocephin (8/14 days; 7/1-7/15) - Neurology consulted, appreciate recommendations - PT/OT/SLP - monitor BMP

## 2022-09-12 NOTE — Anesthesia Postprocedure Evaluation (Signed)
Anesthesia Post Note  Patient: Christopher Farrell  Procedure(s) Performed: TRANSESOPHAGEAL ECHOCARDIOGRAM     Patient location during evaluation: PACU Anesthesia Type: MAC Level of consciousness: awake and alert Pain management: pain level controlled Vital Signs Assessment: post-procedure vital signs reviewed and stable Respiratory status: spontaneous breathing, nonlabored ventilation and respiratory function stable Cardiovascular status: stable and blood pressure returned to baseline Anesthetic complications: no   No notable events documented.  Last Vitals:  Vitals:   09/12/22 1356 09/12/22 1359  BP: 98/77 98/77  Pulse: 83 79  Resp: (!) 0 (!) 0  Temp:    SpO2: 99% 99%    Last Pain:  Vitals:   09/12/22 1329  TempSrc: Temporal  PainSc: 0-No pain                 Beryle Lathe

## 2022-09-12 NOTE — Assessment & Plan Note (Addendum)
-  continue antibiotics

## 2022-09-12 NOTE — Anesthesia Preprocedure Evaluation (Addendum)
Anesthesia Evaluation  Patient identified by MRN, date of birth, ID band Patient awake    Reviewed: Allergy & Precautions, NPO status , Patient's Chart, lab work & pertinent test results  History of Anesthesia Complications Negative for: history of anesthetic complications  Airway Mallampati: II  TM Distance: >3 FB Neck ROM: Full    Dental  (+) Dental Advisory Given Unable to assess:   Pulmonary Current Smoker   breath sounds clear to auscultation       Cardiovascular  Rhythm:Regular Rate:Normal   '24 TTE - EF 60 to 65%. There is a solid appearing mass on the atrial side of the mid/distal anterior MV leaflet, maximum diameter 1.5 cm. Trivial mitral valve  regurgitation.     Neuro/Psych Seizures -,  PSYCHIATRIC DISORDERS Anxiety Depression    CVA    GI/Hepatic negative GI ROS,,,(+)     substance abuse  cocaine use and marijuana use  Endo/Other   Na 131 Ca 7.6   Renal/GU negative Renal ROS     Musculoskeletal negative musculoskeletal ROS (+)    Abdominal   Peds  Hematology  (+) Blood dyscrasia, anemia , HIV INR 1.5 on 7/1     Anesthesia Other Findings HSV  Reproductive/Obstetrics                             Anesthesia Physical Anesthesia Plan  ASA: 4  Anesthesia Plan: MAC   Post-op Pain Management: Minimal or no pain anticipated   Induction:   PONV Risk Score and Plan: 0 and Propofol infusion and Treatment may vary due to age or medical condition  Airway Management Planned: Nasal Cannula and Natural Airway  Additional Equipment: None  Intra-op Plan:   Post-operative Plan:   Informed Consent: I have reviewed the patients History and Physical, chart, labs and discussed the procedure including the risks, benefits and alternatives for the proposed anesthesia with the patient or authorized representative who has indicated his/her understanding and acceptance.     Dental  advisory given  Plan Discussed with: CRNA and Anesthesiologist  Anesthesia Plan Comments: (Consented via phone with mother.)        Anesthesia Quick Evaluation

## 2022-09-12 NOTE — Assessment & Plan Note (Signed)
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 LVEF 60-65% - TEE today, will follow results

## 2022-09-12 NOTE — Progress Notes (Signed)
Nutrition Follow-up  DOCUMENTATION CODES:  Severe malnutrition in context of chronic illness  INTERVENTION:  Recommend reinstating a DYS 1 diet with thin liquids after TEE Monitor for readiness for diet advancement per SLP Ensure Enlive po TID, each supplement provides 350 kcal and 20 grams of protein. Mighty Shake TID, each supplement provides 330kcal and 9g protein  MVI with minerals daily, thiamine daily If intake does not increase, pt would likely benefit from having cortrak replaced on 7/10  NUTRITION DIAGNOSIS:  Severe Malnutrition related to chronic illness (HIV) as evidenced by severe fat depletion, severe muscle depletion. - remains applicable  GOAL:  Patient will meet greater than or equal to 90% of their needs - not progressing, pulled cortrak, poor PO  MONITOR:  PO intake, Supplement acceptance, Diet advancement, Labs  REASON FOR ASSESSMENT:  Consult Assessment of nutrition requirement/status  ASSESSMENT:  Pt with hx of HIV (not compliant with antivirals) and EtOH/tobacco abuse presented to ED with AMS after recently being diagnosed with pneumonia. Found to be septic and then suffered a seizure in ED.  7/1 - intubated  7/3 - cortrak placed (mid to distal stomach), extubated, pt pulled cortrak during PM shift 7/4 - SLP bedside evaluation, DYS1/thin liquids 7/8 - TEE scheduled  Pt sleeping at the time of visit and did not wake when name was called. Noted that pt NPO for TEE this afternoon. Pt pulled his cortrak tube out after extubation and PO intake has been extremely poor since. Would have benefited from replacement of tube on 7/5 to provide nutrition as pt is severely malnourished.   Discussed intake with RN. States that ensure was added yesterday and pt does like them and he tends to drink better than eat. Will add additional supplements to meal tray. Multiple providers noting that pt's mental status is improving. Will monitor to see if intake increases once diet  reinstated after procedure now that mental status is more clear. If intake remains minimal, would likely benefit from replacement of cortrak tube if pt will remain inpatient for several more days.  Average Meal Intake: 7/4-7/7: 9% intake x 6 recorded meals  Nutritionally Relevant Medications: Scheduled Meds:  bictegravir-emtricitabine-tenofovir AF  1 tablet Oral Daily   Ensure Enlive  237 mL Oral TID BM   thiamine  100 mg Oral Daily   Continuous Infusions:  sodium chloride 20 mL/hr at 09/12/22 0817   cefTRIAXone (ROCEPHIN)  IV 2 g (09/12/22 0819)   Labs Reviewed: Na 131 BUN 5, creatinine 0.6 CBG ranges from 92-133 mg/dL over the last 24 hours  NUTRITION - FOCUSED PHYSICAL EXAM:  Flowsheet Row Most Recent Value  Orbital Region Unable to assess  [forehead obsured by equipment]  Upper Arm Region Severe depletion  Thoracic and Lumbar Region Severe depletion  Buccal Region Unable to assess  [ETT holder blocking view]  Temple Region Unable to assess  [forehead obsecured by equipement]  Clavicle Bone Region Moderate depletion  Clavicle and Acromion Bone Region Severe depletion  Scapular Bone Region Severe depletion  Dorsal Hand Moderate depletion  Patellar Region Severe depletion  Anterior Thigh Region Severe depletion  Posterior Calf Region Severe depletion  Edema (RD Assessment) None  Hair Reviewed  Eyes Unable to assess  Mouth Reviewed  Skin Reviewed  Nails Reviewed    Diet Order:   Diet Order             Diet NPO time specified Except for: Sips with Meds  Diet effective midnight  EDUCATION NEEDS:   Not appropriate for education at this time  Skin:  Skin Assessment: Reviewed RN Assessment  Last BM:  7/7 - type 7  Height:   Ht Readings from Last 1 Encounters:  09/02/22 5\' 4"  (1.626 m)    Weight:   Wt Readings from Last 1 Encounters:  09/09/22 52.8 kg    Ideal Body Weight:  59.1 kg  BMI:  Body mass index is 19.98  kg/m.  Estimated Nutritional Needs:  Kcal:  1700-2000 kcal/d Protein:  85-100 g/d Fluid:  >/=1.8L/d    Greig Castilla, RD, LDN Clinical Dietitian RD pager # available in [facility]  After hours/weekend pager # available in [facility]

## 2022-09-12 NOTE — Plan of Care (Signed)
  Problem: Education: Goal: Ability to describe self-care measures that may prevent or decrease complications (Diabetes Survival Skills Education) will improve Outcome: Progressing Goal: Individualized Educational Video(s) Outcome: Progressing   Problem: Coping: Goal: Ability to adjust to condition or change in health will improve Outcome: Progressing   

## 2022-09-12 NOTE — Op Note (Signed)
INDICATIONS: infective endocarditis  PROCEDURE:   Informed consent was obtained prior to the procedure. The risks, benefits and alternatives for the procedure were discussed and the patient comprehended these risks.  Risks include, but are not limited to, cough, sore throat, vomiting, nausea, somnolence, esophageal and stomach trauma or perforation, bleeding, low blood pressure, aspiration, pneumonia, infection, trauma to the teeth and death.    After a procedural time-out, the oropharynx was anesthetized with 20% benzocaine spray.   During this procedure the patient was administered IV propofol by Anesthesiology, Dr. Mal Amabile  The transesophageal probe was inserted in the esophagus and stomach without difficulty and multiple views were obtained.  The patient was kept under observation until the patient left the procedure room.  The patient left the procedure room in stable condition.   Agitated microbubble saline contrast was not administered.  COMPLICATIONS:    There were no immediate complications.  FINDINGS:  Large vegetation of the anterior mitral leaflet. This is mostly fixed, but has mobile components. There is an associated perforation of the anterior leaflet with severe eccentric mitral insufficiency, with a highly eccentric posteriorly-directed jet. No other vegetations are seen.  RECOMMENDATIONS:    Hemodynamically stable patient. IV antibiotic therapy for endocarditis. Repeat transthoracic echo in 4-6 weeks. Reevaluate for mitral valve repair/replacement after completion of therapy.  Time Spent Directly with the Patient:  30 minutes   Christopher Farrell 09/12/2022, 3:21 PM

## 2022-09-12 NOTE — Transfer of Care (Signed)
Immediate Anesthesia Transfer of Care Note  Patient: Christopher Farrell  Procedure(s) Performed: TRANSESOPHAGEAL ECHOCARDIOGRAM  Patient Location: Cath Lab  Anesthesia Type:MAC  Level of Consciousness: drowsy and patient cooperative  Airway & Oxygen Therapy: Patient Spontanous Breathing and Patient connected to nasal cannula oxygen  Post-op Assessment: Report given to RN and Post -op Vital signs reviewed and stable  Post vital signs: Reviewed and stable  Last Vitals:  Vitals Value Taken Time  BP    Temp    Pulse    Resp    SpO2      Last Pain:  Vitals:   09/12/22 1124  TempSrc: Oral  PainSc:       Patients Stated Pain Goal: 0 (09/11/22 2045)  Complications: No notable events documented.

## 2022-09-12 NOTE — Progress Notes (Signed)
Patient back to the unit.

## 2022-09-12 NOTE — Progress Notes (Signed)
[facility]   Reason for visit: Follow up on meningitis  Interval History: MRI last week with concern for emboli to brain.  WBC 8;  extubated and transferred out of ICU.   Day 8 antibiotics  Physical Exam: Constitutional:  Vitals:   09/12/22 0730 09/12/22 1124  BP: 102/71 105/72  Pulse: (!) 101 90  Resp:  15  Temp:  99.2 F (37.3 C)  SpO2:  99%   patient appears in NAD Respiratory: Normal respiratory effort  Review of Systems: Constitutional: negative for fevers and chills  Lab Results  Component Value Date   WBC 8.0 09/12/2022   HGB 9.8 (L) 09/12/2022   HCT 29.0 (L) 09/12/2022   MCV 85.5 09/12/2022   PLT 322 09/12/2022    Lab Results  Component Value Date   CREATININE 0.60 (L) 09/12/2022   BUN 5 (L) 09/12/2022   NA 131 (L) 09/12/2022   K 3.6 09/12/2022   CL 100 09/12/2022   CO2 25 09/12/2022    Lab Results  Component Value Date   ALT 36 09/05/2022   AST 94 (H) 09/05/2022   ALKPHOS 42 09/05/2022     Microbiology: Recent Results (from the past 240 hour(s))  Resp panel by RT-PCR (RSV, Flu A&B, Covid) Anterior Nasal Swab     Status: None   Collection Time: 09/05/22 11:13 AM   Specimen: Anterior Nasal Swab  Result Value Ref Range Status   SARS Coronavirus 2 by RT PCR NEGATIVE NEGATIVE Final   Influenza A by PCR NEGATIVE NEGATIVE Final   Influenza B by PCR NEGATIVE NEGATIVE Final    Comment: (NOTE) The Xpert Xpress SARS-CoV-2/FLU/RSV plus assay is intended as an aid in the diagnosis of influenza from Nasopharyngeal swab specimens and should not be used as a sole basis for treatment. Nasal washings and aspirates are unacceptable for Xpert Xpress SARS-CoV-2/FLU/RSV testing.  Fact Sheet for Patients: BloggerCourse.com  Fact Sheet for Healthcare Providers: SeriousBroker.it  This test is not yet approved or cleared by the [place] FDA and has been authorized for detection  and/or diagnosis of SARS-CoV-2 by FDA under an Emergency Use Authorization (EUA). This EUA will remain in effect (meaning this test can be used) for the duration of the COVID-19 declaration under Section 564(b)(1) of the Act, 21 U.S.C. section 360bbb-3(b)(1), unless the authorization is terminated or revoked.     Resp Syncytial Virus by PCR NEGATIVE NEGATIVE Final    Comment: (NOTE) Fact Sheet for Patients: BloggerCourse.com  Fact Sheet for Healthcare Providers: SeriousBroker.it  This test is not yet approved or cleared by the [place] FDA and has been authorized for detection and/or diagnosis of SARS-CoV-2 by FDA under an Emergency Use Authorization (EUA). This EUA will remain in effect (meaning this test can be used) for the duration of the COVID-19 declaration under Section 564(b)(1) of the Act, 21 U.S.C. section 360bbb-3(b)(1), unless the authorization is terminated or revoked.  Performed at [facility], [address] [place], [place] [address]   Blood Culture (routine x 2)     Status: Abnormal   Collection Time: 09/05/22 11:13 AM   Specimen: BLOOD RIGHT ARM  Result Value Ref Range Status   Specimen Description BLOOD RIGHT ARM  Final   Special Requests   Final    BOTTLES DRAWN AEROBIC AND ANAEROBIC Blood Culture results may not be optimal due to an excessive volume of blood received in culture bottles   Culture  Setup Time  Final    GRAM POSITIVE COCCI IN BOTH AEROBIC AND ANAEROBIC BOTTLES CRITICAL RESULT CALLED TO, READ BACK BY AND VERIFIED WITH: PHARMD H. WILSON 09/06/22 @ 0036 BY AB Performed at [facility], [address] [place], [place] [address]    Culture STREPTOCOCCUS PNEUMONIAE (A)  Final   Report Status 09/08/2022 FINAL  Final   Organism ID, Bacteria STREPTOCOCCUS PNEUMONIAE  Final      Susceptibility   Streptococcus pneumoniae - MIC*    ERYTHROMYCIN >=8 RESISTANT Resistant      LEVOFLOXACIN 0.5 SENSITIVE Sensitive     VANCOMYCIN 0.25 SENSITIVE Sensitive     PENICILLIN (meningitis) 0.5 RESISTANT Resistant     PENO - penicillin 0.5      PENICILLIN (non-meningitis) 0.5 SENSITIVE Sensitive     PENICILLIN (oral) 0.5 INTERMEDIATE Intermediate     CEFTRIAXONE (non-meningitis) 0.25 SENSITIVE Sensitive     CEFTRIAXONE (meningitis) 0.25 SENSITIVE Sensitive     * STREPTOCOCCUS PNEUMONIAE  Blood Culture ID Panel (Reflexed)     Status: Abnormal   Collection Time: 09/05/22 11:13 AM  Result Value Ref Range Status   Enterococcus faecalis NOT DETECTED NOT DETECTED Final   Enterococcus Faecium NOT DETECTED NOT DETECTED Final   Listeria monocytogenes NOT DETECTED NOT DETECTED Final   Staphylococcus species NOT DETECTED NOT DETECTED Final   Staphylococcus aureus (BCID) NOT DETECTED NOT DETECTED Final   Staphylococcus epidermidis NOT DETECTED NOT DETECTED Final   Staphylococcus lugdunensis NOT DETECTED NOT DETECTED Final   Streptococcus species DETECTED (A) NOT DETECTED Final    Comment: CRITICAL RESULT CALLED TO, READ BACK BY AND VERIFIED WITH: PHARMD H. WILSON 09/06/22 @ 0036 BY AB    Streptococcus agalactiae NOT DETECTED NOT DETECTED Final   Streptococcus pneumoniae DETECTED (A) NOT DETECTED Final    Comment: CRITICAL RESULT CALLED TO, READ BACK BY AND VERIFIED WITH: PHARMD H. WILSON 09/06/22 @ 0036 BY AB    Streptococcus pyogenes NOT DETECTED NOT DETECTED Final   A.calcoaceticus-baumannii NOT DETECTED NOT DETECTED Final   Bacteroides fragilis NOT DETECTED NOT DETECTED Final   Enterobacterales NOT DETECTED NOT DETECTED Final   Enterobacter cloacae complex NOT DETECTED NOT DETECTED Final   Escherichia coli NOT DETECTED NOT DETECTED Final   Klebsiella aerogenes NOT DETECTED NOT DETECTED Final   Klebsiella oxytoca NOT DETECTED NOT DETECTED Final   Klebsiella pneumoniae NOT DETECTED NOT DETECTED Final   Proteus species NOT DETECTED NOT DETECTED Final   Salmonella species NOT  DETECTED NOT DETECTED Final   Serratia marcescens NOT DETECTED NOT DETECTED Final   Haemophilus influenzae NOT DETECTED NOT DETECTED Final   Neisseria meningitidis NOT DETECTED NOT DETECTED Final   Pseudomonas aeruginosa NOT DETECTED NOT DETECTED Final   Stenotrophomonas maltophilia NOT DETECTED NOT DETECTED Final   Candida albicans NOT DETECTED NOT DETECTED Final   Candida auris NOT DETECTED NOT DETECTED Final   Candida glabrata NOT DETECTED NOT DETECTED Final   Candida krusei NOT DETECTED NOT DETECTED Final   Candida parapsilosis NOT DETECTED NOT DETECTED Final   Candida tropicalis NOT DETECTED NOT DETECTED Final   Cryptococcus neoformans/gattii NOT DETECTED NOT DETECTED Final    Comment: Performed at [facility], [address] [place], [place] [address]  Blood Culture (routine x 2)     Status: None   Collection Time: 09/05/22  1:28 PM   Specimen: BLOOD LEFT HAND  Result Value Ref Range Status   Specimen Description BLOOD LEFT HAND  Final   Special Requests  Final    BOTTLES DRAWN AEROBIC AND ANAEROBIC Blood Culture results may not be optimal due to an inadequate volume of blood received in culture bottles   Culture   Final    NO GROWTH 5 DAYS Performed at [facility], [address] [place], [place] [address]    Report Status 09/10/2022 FINAL  Final  CSF culture w Gram Stain     Status: None   Collection Time: 09/05/22  5:56 PM   Specimen: CSF; Cerebrospinal Fluid  Result Value Ref Range Status   Specimen Description CSF  Final   Special Requests LP  Final   Gram Stain   Final    WBC PRESENT, PREDOMINANTLY PMN GRAM POSITIVE COCCI IN PAIRS CYTOSPIN SMEAR CRITICAL RESULT CALLED TO, READ BACK BY AND VERIFIED WITH: RN [place] ALLEN ON 09/05/22 @ 1853 BY DRT Performed at [facility], [address] [place], [place] [address]    Culture RARE STREPTOCOCCUS PNEUMONIAE  Final   Report Status 09/08/2022 FINAL  Final   Organism ID, Bacteria STREPTOCOCCUS  PNEUMONIAE  Final      Susceptibility   Streptococcus pneumoniae - MIC*    ERYTHROMYCIN >=8 RESISTANT Resistant     LEVOFLOXACIN 0.5 SENSITIVE Sensitive     VANCOMYCIN 0.25 SENSITIVE Sensitive     PENICILLIN (meningitis) 0.5 RESISTANT Resistant     PENO - penicillin 0.5      PENICILLIN (non-meningitis) 0.5 SENSITIVE Sensitive     PENICILLIN (oral) 0.5 INTERMEDIATE Intermediate     CEFTRIAXONE (non-meningitis) 0.25 SENSITIVE Sensitive     CEFTRIAXONE (meningitis) 0.25 SENSITIVE Sensitive     * RARE STREPTOCOCCUS PNEUMONIAE  Culture, fungus without smear     Status: None (Preliminary result)   Collection Time: 09/05/22  5:56 PM   Specimen: CSF; Cerebrospinal Fluid  Result Value Ref Range Status   Specimen Description CSF  Final   Special Requests LP  Final   Culture   Final    NO FUNGUS ISOLATED AFTER 6 DAYS Performed at [facility], [address], [place], [place] [address]    Report Status PENDING  Incomplete  MRSA Next Gen by PCR, Nasal     Status: None   Collection Time: 09/05/22  6:41 PM   Specimen: Nasal Mucosa; Nasal Swab  Result Value Ref Range Status   MRSA by PCR Next Gen NOT DETECTED NOT DETECTED Final    Comment: (NOTE) The GeneXpert MRSA Assay (FDA approved for NASAL specimens only), is one component of a comprehensive MRSA colonization surveillance program. It is not intended to diagnose MRSA infection nor to guide or monitor treatment for MRSA infections. Test performance is not FDA approved in patients less than 53 years old. Performed at [facility], [address], [place], [place] [address]   Culture, blood (Routine X 2)     Status: None   Collection Time: 09/05/22  6:53 PM   Specimen: BLOOD  Result Value Ref Range Status   Specimen Description BLOOD RIGHT ANTECUBITAL  Final   Special Requests   Final    BOTTLES DRAWN AEROBIC AND ANAEROBIC Blood Culture adequate volume   Culture   Final    NO GROWTH 5 DAYS Performed at [facility]  [facility], [address] [place], [place] [address]    Report Status 09/10/2022 FINAL  Final  Culture, blood (Routine X 2)     Status: None   Collection Time: 09/05/22  6:55 PM   Specimen:  BLOOD  Result Value Ref Range Status   Specimen Description BLOOD SITE NOT SPECIFIED  Final   Special Requests   Final    BOTTLES DRAWN AEROBIC AND ANAEROBIC Blood Culture results may not be optimal due to an inadequate volume of blood received in culture bottles   Culture   Final    NO GROWTH 5 DAYS Performed at [facility], [address] [place], [place] [address]    Report Status 09/10/2022 FINAL  Final  Culture, blood (Routine X 2) w Reflex to ID Panel     Status: None   Collection Time: 09/06/22 11:40 PM   Specimen: BLOOD  Result Value Ref Range Status   Specimen Description BLOOD BLOOD RIGHT HAND  Final   Special Requests   Final    BOTTLES DRAWN AEROBIC AND ANAEROBIC Blood Culture adequate volume   Culture   Final    NO GROWTH 5 DAYS Performed at [facility], [address] [place], [place] [address]    Report Status 09/11/2022 FINAL  Final  Culture, blood (Routine X 2) w Reflex to ID Panel     Status: None   Collection Time: 09/06/22 11:40 PM   Specimen: BLOOD  Result Value Ref Range Status   Specimen Description BLOOD BLOOD LEFT HAND  Final   Special Requests   Final    BOTTLES DRAWN AEROBIC AND ANAEROBIC Blood Culture adequate volume   Culture   Final    NO GROWTH 5 DAYS Performed at [facility], 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  Now improving overall with improved mentation.   Will need a prolonged treatment course  2.  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  Though unusual cause of  endocarditis, he likely was sick for a prolonged period and with a poor immune system, certainly makes it more possible.   TEE tomorrow  3.  HIV - on Biktarvy and no issues. Will need to continue on this.   CD4 205, viral load 100k.

## 2022-09-12 NOTE — Interval H&P Note (Signed)
History and Physical Interval Note:  09/12/2022 10:54 AM  Christopher Farrell  has presented today for surgery, with the diagnosis of vegetation.  The various methods of treatment have been discussed with the patient and family. After consideration of risks, benefits and other options for treatment, the patient has consented to  Procedure(s): TRANSESOPHAGEAL ECHOCARDIOGRAM (N/A) as a surgical intervention.  The patient's history has been reviewed, patient examined, no change in status, stable for surgery.  I have reviewed the patient's chart and labs.  Questions were answered to the patient's satisfaction.     Lynanne Delgreco

## 2022-09-12 NOTE — Assessment & Plan Note (Signed)
Continue Biktarvy. 

## 2022-09-12 NOTE — Progress Notes (Signed)
Occupational Therapy Treatment Patient Details Name: Christopher Farrell MRN: [id number] DOB: 1961/01/11 Today's Date: 09/12/2022   History of present illness Pt is a 62 y/o male presenting on 7/1 with cough and AMS. Noted presented to ED on 6/26 and found with multifocal pneumonia. Returned to ED after being found unresponsive, admitted with sepsis. 2 seizures in ED. CSF reveals bacterial meningitis. MRI with "multifocal acute/subacute ischemia within the right greater than left hemispheres and left cerebellum. Largest lesions in R parietal and occipital lobes". Intubated 7/1-7/3. PMH: HIV.   OT comments  Patient demonstrating gains with grooming standing at sink from sitting, toilet transfers with min assist, and following directions. Patient requires frequent cues for safety. Patient will benefit from intensive inpatient follow up therapy, >3 hours/day to increase independence and safety with self care and functional transfers. Acute OT to continue to follow.    Recommendations for follow up therapy are one component of a multi-disciplinary discharge planning process, led by the attending physician.  Recommendations may be updated based on patient status, additional functional criteria and insurance authorization.    Assistance Recommended at Discharge Frequent or constant Supervision/Assistance  Patient can return home with the following  Assistance with cooking/housework;Direct supervision/assist for medications management;Direct supervision/assist for financial management;Help with stairs or ramp for entrance;Assist for transportation;A little help with walking and/or transfers;A little help with bathing/dressing/bathroom   Equipment Recommendations  Other (comment) (defer)    Recommendations for Other Services Rehab consult    Precautions / Restrictions Precautions Precautions: Fall Restrictions Weight Bearing Restrictions: No       Mobility Bed Mobility Overal bed mobility: Needs  Assistance Bed Mobility: Rolling, Sidelying to Sit, Sit to Supine Rolling: Modified independent (Device/Increase time) Sidelying to sit: Supervision   Sit to supine: Supervision   General bed mobility comments: verbal cues to initate to and scoot to EOB    Transfers Overall transfer level: Needs assistance Equipment used: Rolling walker (2 wheels) Transfers: Sit to/from Stand, Bed to chair/wheelchair/BSC Sit to Stand: Min assist     Step pivot transfers: Min assist     General transfer comment: verbal cues for hand placement and min assist to power up. Min assist for walker management and cues to stay with walker     Balance Overall balance assessment: Needs assistance Sitting-balance support: Feet supported Sitting balance-Leahy Scale: Fair Sitting balance - Comments: able to perform LB dressing seated on EOB   Standing balance support: Single extremity supported, Bilateral upper extremity supported, During functional activity Standing balance-Leahy Scale: Poor Standing balance comment: reliant on external support                           ADL either performed or assessed with clinical judgement   ADL Overall ADL's : Needs assistance/impaired     Grooming: Wash/dry hands;Wash/dry face;Minimal assistance;Standing   Upper Body Bathing: Minimal assistance;Standing           Lower Body Dressing: Moderate assistance;Sitting/lateral leans Lower Body Dressing Details (indicate cue type and reason): changed socks Toilet Transfer: Minimal assistance;Ambulation Toilet Transfer Details (indicate cue type and reason): simulated                Extremity/Trunk Assessment              Vision       Perception     Praxis      Cognition Arousal/Alertness: Awake/alert Behavior During Therapy: Flat affect Overall Cognitive Status: Impaired/Different from baseline  Area of Impairment: Orientation, Attention, Memory, Following commands,  Safety/judgement, Awareness, Problem solving                 Orientation Level: Disoriented to, Time, Situation Current Attention Level: Sustained Memory: Decreased recall of precautions, Decreased short-term memory Following Commands: Follows one step commands consistently, Follows one step commands with increased time Safety/Judgement: Decreased awareness of safety, Decreased awareness of deficits Awareness: Intellectual Problem Solving: Slow processing, Decreased initiation, Difficulty sequencing, Requires verbal cues, Requires tactile cues General Comments: answers orientation questions with increased time and cues for date        Exercises      Shoulder Instructions       General Comments BP seated on EOB 104/79, while standing 102/71    Pertinent Vitals/ Pain       Pain Assessment Pain Assessment: No/denies pain  Home Living                                          Prior Functioning/Environment              Frequency  Min 2X/week        Progress Toward Goals  OT Goals(current goals can now be found in the care plan section)  Progress towards OT goals: Progressing toward goals  Acute Rehab OT Goals OT Goal Formulation: Patient unable to participate in goal setting Time For Goal Achievement: 09/22/22 Potential to Achieve Goals: Fair ADL Goals Pt Will Perform Grooming: sitting;with min assist Pt Will Perform Upper Body Dressing: with min assist;sitting Pt Will Transfer to Toilet: with mod assist;bedside commode Additional ADL Goal #1: Patient will complete bed mobility with min assist and maintain sitting balance at EOB with min guard for 5 minutes as precursor to ADLs. Additional ADL Goal #2: Pt will follow 1 step commands with 90% accuracy given increased time.  Plan Discharge plan remains appropriate    Co-evaluation                 AM-PAC OT "6 Clicks" Daily Activity     Outcome Measure   Help from another person  eating meals?: A Little Help from another person taking care of personal grooming?: A Little Help from another person toileting, which includes using toliet, bedpan, or urinal?: A Lot Help from another person bathing (including washing, rinsing, drying)?: A Lot Help from another person to put on and taking off regular upper body clothing?: A Lot Help from another person to put on and taking off regular lower body clothing?: A Lot 6 Click Score: 14    End of Session Equipment Utilized During Treatment: Gait belt;Rolling walker (2 wheels)  OT Visit Diagnosis: Other abnormalities of gait and mobility (R26.89);Muscle weakness (generalized) (M62.81);Pain;Other symptoms and signs involving cognitive function;Cognitive communication deficit (R41.841);Other symptoms and signs involving the nervous system (R29.898)   Activity Tolerance Patient tolerated treatment well   Patient Left in bed;with call bell/phone within reach;with bed alarm set   Nurse Communication Mobility status;Precautions        Time: 1610-9604 OT Time Calculation (min): 24 min  Charges: OT General Charges $OT Visit: 1 Visit OT Treatments $Self Care/Home Management : 23-37 mins  Alfonse Flavors, OTA Acute Rehabilitation Services  Office [phone]   Dewain Penning 09/12/2022, 8:27 AM

## 2022-09-12 NOTE — Progress Notes (Signed)
Inpatient Rehab Admissions Coordinator:   Pt.'s mentation was much clearer this morning. I discussed CIR again with him and provided a cost estimate as he is uninsured. He states that he would most likely rather go home but will think about CIR. I will check in with him tomorrow for a decision.   Megan Salon, MS, CCC-SLP Rehab Admissions Coordinator  [phone] (celll) [phone] (office)

## 2022-09-12 NOTE — Assessment & Plan Note (Signed)
Patient was admitted for bacterial meningitis.  Mentation has improved since admission.  He is aware of his name, location, month.  - assess mental status daily to monitor improvement - STAT head CT for any change in neuro exam  

## 2022-09-13 ENCOUNTER — Other Ambulatory Visit: Payer: Self-pay

## 2022-09-13 ENCOUNTER — Encounter (HOSPITAL_COMMUNITY): Payer: Self-pay | Admitting: Cardiovascular Disease

## 2022-09-13 LAB — GLUCOSE, CAPILLARY
Glucose-Capillary: 92 mg/dL (ref 70–99)
Glucose-Capillary: 117 mg/dL — ABNORMAL HIGH (ref 70–99)
Glucose-Capillary: 121 mg/dL — ABNORMAL HIGH (ref 70–99)
Glucose-Capillary: 103 mg/dL — ABNORMAL HIGH (ref 70–99)

## 2022-09-13 NOTE — Assessment & Plan Note (Signed)
Patient was admitted for bacterial meningitis.  Mentation has improved since admission.  He is aware of his name, location, month.  - assess mental status daily to monitor improvement - STAT head CT for any change in neuro exam  

## 2022-09-13 NOTE — TOC Progression Note (Addendum)
Transition of Care [facility] - Progression Note    Patient Details  Name: Christopher Farrell MRN: [id number] Date of Birth: 1960-09-08  Transition of Care [facility]) CM/SW Contact  Kermit Balo, RN Phone Number: 09/13/2022, 1:28 PM  Clinical Narrative:    CIR unable to accept for rehab. Pt with polysubstance use history. Needs 6 weeks of IV abx. Mom is not willing to administer the medications at home and pt is not capable.  TOC following.   Expected Discharge Plan: Home/Self Care    Expected Discharge Plan and Services                                               Social Determinants of Health (SDOH) Interventions SDOH Screenings   Food Insecurity: No Food Insecurity (09/13/2022)  Housing: Patient Declined (09/13/2022)  Transportation Needs: No Transportation Needs (09/13/2022)  Utilities: Not At Risk (09/13/2022)  Depression (PHQ2-9): High Risk (09/02/2022)  Tobacco Use: High Risk (09/13/2022)    Readmission Risk Interventions     No data to display

## 2022-09-13 NOTE — Assessment & Plan Note (Signed)
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 - continue Rocephin 

## 2022-09-13 NOTE — Progress Notes (Signed)
Physical Therapy Treatment Patient Details Name: Christopher Farrell MRN: [id number] DOB: [birth year] Today's Date: 09/13/2022   History of Present Illness Pt is a 62 y/o male presenting on 7/1 with cough and AMS. Noted presented to ED on 6/26 and found with multifocal pneumonia. Returned to ED after being found unresponsive, admitted with sepsis. 2 seizures in ED. CSF reveals bacterial meningitis. MRI with "multifocal acute/subacute ischemia within the right greater than left hemispheres and left cerebellum. Largest lesions in R parietal and occipital lobes". Intubated 7/1-7/3. PMH: HIV.    PT Comments  Pt progressing well towards all goals. Per case management pt will need to stay in hospital for 6 weeks for IV antibiotics. Pt currently functioning at minA level with RW and anticipate over the next 5 weeks pt will progress to level of function he will be safe to return home with mother. Acute PT to cont to follow patient and progress ambulation to no AD and address generalized weakness and impaired balance.     Assistance Recommended at Discharge Frequent or constant Supervision/Assistance  If plan is discharge home, recommend the following:  Can travel by private vehicle    Direct supervision/assist for medications management;Direct supervision/assist for financial management;Assist for transportation;A little help with walking and/or transfers;A little help with bathing/dressing/bathroom      Equipment Recommendations  BSC/3in1;Rolling walker (2 wheels) (pending progress)    Recommendations for Other Services       Precautions / Restrictions Precautions Precautions: Fall Restrictions Weight Bearing Restrictions: No     Mobility  Bed Mobility Overal bed mobility: Needs Assistance Bed Mobility: Supine to Sit     Supine to sit: HOB elevated, Supervision     General bed mobility comments: increased time, verbal directional cues    Transfers Overall transfer level: Needs  assistance Equipment used: Rolling walker (2 wheels) Transfers: Sit to/from Stand, Bed to chair/wheelchair/BSC Sit to Stand: Min assist           General transfer comment: verbal cues for hand placement, Min assist for walker management and cues to stay with walker    Ambulation/Gait Ambulation/Gait assistance: Min assist Gait Distance (Feet): 175 Feet Assistive device: Rolling walker (2 wheels) Gait Pattern/deviations: Step-through pattern, Trunk flexed, Decreased stride length Gait velocity: decreased     General Gait Details: pt with reciprocal gait pattern, no LOB, minA for walker management, verbal cues for safe turning with RW to not step outside of RW   Stairs             Wheelchair Mobility     Tilt Bed    Modified Rankin (Stroke Patients Only) Modified Rankin (Stroke Patients Only) Pre-Morbid Rankin Score: No symptoms Modified Rankin: Moderate disability     Balance Overall balance assessment: Needs assistance Sitting-balance support: Feet supported Sitting balance-Leahy Scale: Good Sitting balance - Comments: able to perform LB dressing seated on EOB   Standing balance support: Bilateral upper extremity supported, During functional activity Standing balance-Leahy Scale: Fair Standing balance comment: benefits from RW for amb, pt with fair static standing balance                            Cognition Arousal/Alertness: Awake/alert Behavior During Therapy: Flat affect Overall Cognitive Status: Impaired/Different from baseline Area of Impairment: Attention, Memory, Following commands, Safety/judgement, Awareness, Problem solving                   Current Attention Level: Sustained Memory: Decreased  recall of precautions, Decreased short-term memory Following Commands: Follows one step commands with increased time Safety/Judgement: Decreased awareness of safety, Decreased awareness of deficits Awareness: Intellectual Problem  Solving: Slow processing, Decreased initiation, Difficulty sequencing, Requires verbal cues, Requires tactile cues General Comments: pt with delayed response time but able to answer orientation questions with cues, pt with STM deficits constantly asking therapist "what do you want me to do?" pt with very flat affect        Exercises      General Comments General comments (skin integrity, edema, etc.): VSS      Pertinent Vitals/Pain Pain Assessment Pain Assessment: No/denies pain    Home Living Family/patient expects to be discharged to:: Private residence Living Arrangements: Parent                      Prior Function            PT Goals (current goals can now be found in the care plan section) Acute Rehab PT Goals Patient Stated Goal: did not state PT Goal Formulation: With patient/family Time For Goal Achievement: 09/22/22 Potential to Achieve Goals: Good Progress towards PT goals: Progressing toward goals    Frequency    Min 3X/week      PT Plan Frequency needs to be updated;Discharge plan needs to be updated    Co-evaluation              AM-PAC PT "6 Clicks" Mobility   Outcome Measure  Help needed turning from your back to your side while in a flat bed without using bedrails?: None Help needed moving from lying on your back to sitting on the side of a flat bed without using bedrails?: A Little Help needed moving to and from a bed to a chair (including a wheelchair)?: A Little Help needed standing up from a chair using your arms (e.g., wheelchair or bedside chair)?: A Little Help needed to walk in hospital room?: A Little Help needed climbing 3-5 steps with a railing? : A Lot 6 Click Score: 18    End of Session Equipment Utilized During Treatment: Gait belt Activity Tolerance: Patient tolerated treatment well Patient left: with call bell/phone within reach;in chair;with chair alarm set Nurse Communication: Mobility status PT Visit  Diagnosis: Unsteadiness on feet (R26.81);Other abnormalities of gait and mobility (R26.89);Muscle weakness (generalized) (M62.81);Difficulty in walking, not elsewhere classified (R26.2)     Time: 1610-9604 PT Time Calculation (min) (ACUTE ONLY): 19 min  Charges:    $Gait Training: 8-22 mins PT General Charges $$ ACUTE PT VISIT: 1 Visit                     Lewis Shock, PT, DPT Acute Rehabilitation Services Secure chat preferred Office #: [phone]    Iona Hansen 09/13/2022, 11:31 AM

## 2022-09-13 NOTE — Assessment & Plan Note (Signed)
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 LVEF 60-65%. TEE showed a large vegetation of the anterior mitral leaflet. This is mostly fixed, but has mobile components. There is an associated perforation of the anterior leaflet with severe eccentric mitral insufficiency, with a highly eccentric posteriorly-directed jet.  - Continue IV Rocephin

## 2022-09-13 NOTE — Assessment & Plan Note (Signed)
-  continue antibiotics

## 2022-09-13 NOTE — Progress Notes (Addendum)
Daily Progress Note Intern Pager: [phone]  Patient name: Christopher Farrell Medical record number: [id number] Date of birth: November 03, 1960 Age: 62 y.o. Gender: male  Primary Care Provider: Pcp, No Consultants: Cardiology, CCM, ID, Neurology  Code Status: Full Code  Pt Overview and Major Events to Date:  7/1: Admission and Intubation 7/4: Extubated 7/5 Transferred to FMTS   Assessment and Plan: Christopher Farrell is a 62 y.o. 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 Pertinent PMH/PSH includes HIV not on antiretrovirals.   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 Due to the vegetation  seen on his TEE, we will require a prolonged course of abx for 6 weeks.  Patient has no insurance and has a history of drug abuse so he is not a  candidate for CIR or SNF or IV access at home. - Continue Rocephin, will require 6 weeks of abx therapy due to vegetation  on TEE (Day 9; 7/1-8/12) - Monitor BMP - Will discuss with CM and ID to figure out dispo options        Altered mental status     Appears closer to baseline. 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  LVEF 60-65%. TEE showed a large vegetation of the anterior mitral leaflet.  This is mostly fixed, but has mobile components. There is an associated  perforation of the anterior leaflet with severe eccentric mitral  insufficiency, with a highly eccentric posteriorly-directed jet.  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 - continue Rocephin      FEN/GI: Dysphagia 1 PPx: Lovenox Dispo: pending clinical improvement  Subjective:  Patient did well overnight and has no complaints this morning. He feels a little weak overall.   Objective: Temp:  [97.8 F (36.6 C)-99 F (37.2 C)] 98.6 F (37 C) (07/09 1121) Pulse Rate:  [79-95] 90 (07/09 1121) Resp:  [0-23] 20 (07/09 1121) BP: (85-113)/(63-77) 90/66 (07/09 1121) SpO2:  [94 %-100 %] 100 % (07/09 1121) Weight:  [49 kg] 49 kg (07/09 1116)  Physical Exam: General: NAD, awake and alert. Cardiovascular: RRR. Mitral regurg murmur. Respiratory: CTAB, normal WOB on RA Abdomen: soft, nontender, nondistended Extremities: no BLE edema Neuro: A&Ox3. No focal neurological deficits.  Laboratory: Most recent CBC Lab Results  Component Value Date   WBC 8.0 09/12/2022   HGB 9.8 (L) 09/12/2022   HCT 29.0 (L) 09/12/2022   MCV 85.5 09/12/2022   PLT 322 09/12/2022   Most recent BMP    Latest Ref Rng & Units 09/12/2022    6:30 AM  BMP  Glucose 70 - 99 mg/dL 91   BUN 8 - 23 mg/dL 5   Creatinine 2.84 - 1.32 mg/dL 4.40   Sodium 102 - 725 mmol/L 131   Potassium 3.5 - 5.1 mmol/L 3.6   Chloride 98 - 111 mmol/L 100  CO2 22 - 32 mmol/L 25   Calcium 8.9 - 10.3 mg/dL 7.6     Imaging/Diagnostic Tests: No new imaging.  Fortunato Curling, DO 09/13/2022, 11:51 AM PGY-1, [facility]  FPTS Intern pager: [phone], text pages welcome Secure chat group [facility] Teaching Service

## 2022-09-13 NOTE — Progress Notes (Signed)
Inpatient Rehab Admissions Coordinator:   Note that Pt. Now with endocarditis to require IV antibiotics until 8/12. Pt. With history of polysubstance abuse, so cannot accept to CIR at this time as he would not be able to safely d/c with IV access. Will need to remain on acute  until IV ABX are completed. Cir will sign off for now.   Megan Salon, MS, CCC-SLP Rehab Admissions Coordinator  [phone] (celll) [phone] (office)

## 2022-09-13 NOTE — Assessment & Plan Note (Signed)
Stable, mental status appears to be at baseline - Continue Rocephin (9/14 days; 7/1-7/15) - Neurology consulted, appreciate recommendations - Possible CIR - monitor BMP

## 2022-09-13 NOTE — Assessment & Plan Note (Addendum)
Stable, mental status appears to be at baseline. Due to the vegetation seen on his TEE, we will require a prolonged course of abx for 6 weeks. Patient has no insurance and has a history of drug abuse so he is not a candidate for CIR or SNF or IV access at home. - Continue Rocephin, will require 6 weeks of abx therapy due to vegetation on TEE (Day 9; 7/1-8/12) - Monitor BMP - Will discuss with CM and ID to figure out dispo options

## 2022-09-13 NOTE — Assessment & Plan Note (Signed)
Continue Biktarvy. 

## 2022-09-13 NOTE — Progress Notes (Signed)
[facility]   Reason for visit: Follow up on meningitis and infective endocarditis  Interval History: TEE yesterday c/w large vegetation on the mitral valve with perforation and severe eccentric mitral insufficiency Day 9 total antibiotics  Physical Exam: Constitutional:  Vitals:   09/13/22 0500 09/13/22 0923  BP: 100/63 (!) 85/67  Pulse: 94 88  Resp: 18 17  Temp: 98.3 F (36.8 C) 98.7 F (37.1 C)  SpO2: 99% 99%   patient appears in NAD, up in chair Respiratory: Normal respiratory effort  Review of Systems: Constitutional: negative for fevers and chills  Lab Results  Component Value Date   WBC 8.0 09/12/2022   HGB 9.8 (L) 09/12/2022   HCT 29.0 (L) 09/12/2022   MCV 85.5 09/12/2022   PLT 322 09/12/2022    Lab Results  Component Value Date   CREATININE 0.60 (L) 09/12/2022   BUN 5 (L) 09/12/2022   NA 131 (L) 09/12/2022   K 3.6 09/12/2022   CL 100 09/12/2022   CO2 25 09/12/2022    Lab Results  Component Value Date   ALT 36 09/05/2022   AST 94 (H) 09/05/2022   ALKPHOS 42 09/05/2022     Microbiology: Recent Results (from the past 240 hour(s))  Resp panel by RT-PCR (RSV, Flu A&B, Covid) Anterior Nasal Swab     Status: None   Collection Time: 09/05/22 11:13 AM   Specimen: Anterior Nasal Swab  Result Value Ref Range Status   SARS Coronavirus 2 by RT PCR NEGATIVE NEGATIVE Final   Influenza A by PCR NEGATIVE NEGATIVE Final   Influenza B by PCR NEGATIVE NEGATIVE Final    Comment: (NOTE) The Xpert Xpress SARS-CoV-2/FLU/RSV plus assay is intended as an aid in the diagnosis of influenza from Nasopharyngeal swab specimens and should not be used as a sole basis for treatment. Nasal washings and aspirates are unacceptable for Xpert Xpress SARS-CoV-2/FLU/RSV testing.  Fact Sheet for Patients: BloggerCourse.com  Fact Sheet for Healthcare Providers: SeriousBroker.it  This test is not yet  approved or cleared by the [place] FDA and has been authorized for detection and/or diagnosis of SARS-CoV-2 by FDA under an Emergency Use Authorization (EUA). This EUA will remain in effect (meaning this test can be used) for the duration of the COVID-19 declaration under Section 564(b)(1) of the Act, 21 U.S.C. section 360bbb-3(b)(1), unless the authorization is terminated or revoked.     Resp Syncytial Virus by PCR NEGATIVE NEGATIVE Final    Comment: (NOTE) Fact Sheet for Patients: BloggerCourse.com  Fact Sheet for Healthcare Providers: SeriousBroker.it  This test is not yet approved or cleared by the [place] FDA and has been authorized for detection and/or diagnosis of SARS-CoV-2 by FDA under an Emergency Use Authorization (EUA). This EUA will remain in effect (meaning this test can be used) for the duration of the COVID-19 declaration under Section 564(b)(1) of the Act, 21 U.S.C. section 360bbb-3(b)(1), unless the authorization is terminated or revoked.  Performed at [facility], [address], [place], [place] [address]   Blood Culture (routine x 2)     Status: Abnormal   Collection Time: 09/05/22 11:13 AM   Specimen: BLOOD RIGHT ARM  Result Value Ref Range Status   Specimen Description BLOOD RIGHT ARM  Final   Special Requests   Final    BOTTLES DRAWN AEROBIC AND ANAEROBIC Blood Culture results may not be optimal due to an excessive volume of blood received in culture bottles   Culture  Setup Time   Final    GRAM POSITIVE COCCI IN BOTH AEROBIC AND ANAEROBIC BOTTLES CRITICAL RESULT CALLED TO, READ BACK BY AND VERIFIED WITH: PHARMD H. WILSON 09/06/22 @ 0036 BY AB Performed at [facility], [address], [place], [place] [address]    Culture STREPTOCOCCUS PNEUMONIAE (A)  Final   Report Status 09/08/2022 FINAL  Final   Organism ID, Bacteria STREPTOCOCCUS PNEUMONIAE  Final      Susceptibility    Streptococcus pneumoniae - MIC*    ERYTHROMYCIN >=8 RESISTANT Resistant     LEVOFLOXACIN 0.5 SENSITIVE Sensitive     VANCOMYCIN 0.25 SENSITIVE Sensitive     PENICILLIN (meningitis) 0.5 RESISTANT Resistant     PENO - penicillin 0.5      PENICILLIN (non-meningitis) 0.5 SENSITIVE Sensitive     PENICILLIN (oral) 0.5 INTERMEDIATE Intermediate     CEFTRIAXONE (non-meningitis) 0.25 SENSITIVE Sensitive     CEFTRIAXONE (meningitis) 0.25 SENSITIVE Sensitive     * STREPTOCOCCUS PNEUMONIAE  Blood Culture ID Panel (Reflexed)     Status: Abnormal   Collection Time: 09/05/22 11:13 AM  Result Value Ref Range Status   Enterococcus faecalis NOT DETECTED NOT DETECTED Final   Enterococcus Faecium NOT DETECTED NOT DETECTED Final   Listeria monocytogenes NOT DETECTED NOT DETECTED Final   Staphylococcus species NOT DETECTED NOT DETECTED Final   Staphylococcus aureus (BCID) NOT DETECTED NOT DETECTED Final   Staphylococcus epidermidis NOT DETECTED NOT DETECTED Final   Staphylococcus lugdunensis NOT DETECTED NOT DETECTED Final   Streptococcus species DETECTED (A) NOT DETECTED Final    Comment: CRITICAL RESULT CALLED TO, READ BACK BY AND VERIFIED WITH: PHARMD H. WILSON 09/06/22 @ 0036 BY AB    Streptococcus agalactiae NOT DETECTED NOT DETECTED Final   Streptococcus pneumoniae DETECTED (A) NOT DETECTED Final    Comment: CRITICAL RESULT CALLED TO, READ BACK BY AND VERIFIED WITH: PHARMD H. WILSON 09/06/22 @ 0036 BY AB    Streptococcus pyogenes NOT DETECTED NOT DETECTED Final   A.calcoaceticus-baumannii NOT DETECTED NOT DETECTED Final   Bacteroides fragilis NOT DETECTED NOT DETECTED Final   Enterobacterales NOT DETECTED NOT DETECTED Final   Enterobacter cloacae complex NOT DETECTED NOT DETECTED Final   Escherichia coli NOT DETECTED NOT DETECTED Final   Klebsiella aerogenes NOT DETECTED NOT DETECTED Final   Klebsiella oxytoca NOT DETECTED NOT DETECTED Final   Klebsiella pneumoniae NOT DETECTED NOT DETECTED Final    Proteus species NOT DETECTED NOT DETECTED Final   Salmonella species NOT DETECTED NOT DETECTED Final   Serratia marcescens NOT DETECTED NOT DETECTED Final   Haemophilus influenzae NOT DETECTED NOT DETECTED Final   Neisseria meningitidis NOT DETECTED NOT DETECTED Final   Pseudomonas aeruginosa NOT DETECTED NOT DETECTED Final   Stenotrophomonas maltophilia NOT DETECTED NOT DETECTED Final   Candida albicans NOT DETECTED NOT DETECTED Final   Candida auris NOT DETECTED NOT DETECTED Final   Candida glabrata NOT DETECTED NOT DETECTED Final   Candida krusei NOT DETECTED NOT DETECTED Final   Candida parapsilosis NOT DETECTED NOT DETECTED Final   Candida tropicalis NOT DETECTED NOT DETECTED Final   Cryptococcus neoformans/gattii NOT DETECTED NOT DETECTED Final    Comment: Performed at [facility], [address], [place], [place] [address]  Blood Culture (routine x 2)     Status: None   Collection Time: 09/05/22  1:28 PM   Specimen: BLOOD LEFT HAND  Result Value Ref Range Status   Specimen Description BLOOD LEFT HAND  Final  Special Requests   Final    BOTTLES DRAWN AEROBIC AND ANAEROBIC Blood Culture results may not be optimal due to an inadequate volume of blood received in culture bottles   Culture   Final    NO GROWTH 5 DAYS Performed at [facility], [address], [place], [place] [address]    Report Status 09/10/2022 FINAL  Final  CSF culture w Gram Stain     Status: None   Collection Time: 09/05/22  5:56 PM   Specimen: CSF; Cerebrospinal Fluid  Result Value Ref Range Status   Specimen Description CSF  Final   Special Requests LP  Final   Gram Stain   Final    WBC PRESENT, PREDOMINANTLY PMN GRAM POSITIVE COCCI IN PAIRS CYTOSPIN SMEAR CRITICAL RESULT CALLED TO, READ BACK BY AND VERIFIED WITH: RN [place] ALLEN ON 09/05/22 @ 1853 BY DRT Performed at [facility], [address] [place], [place] [address]    Culture RARE STREPTOCOCCUS PNEUMONIAE  Final   Report  Status 09/08/2022 FINAL  Final   Organism ID, Bacteria STREPTOCOCCUS PNEUMONIAE  Final      Susceptibility   Streptococcus pneumoniae - MIC*    ERYTHROMYCIN >=8 RESISTANT Resistant     LEVOFLOXACIN 0.5 SENSITIVE Sensitive     VANCOMYCIN 0.25 SENSITIVE Sensitive     PENICILLIN (meningitis) 0.5 RESISTANT Resistant     PENO - penicillin 0.5      PENICILLIN (non-meningitis) 0.5 SENSITIVE Sensitive     PENICILLIN (oral) 0.5 INTERMEDIATE Intermediate     CEFTRIAXONE (non-meningitis) 0.25 SENSITIVE Sensitive     CEFTRIAXONE (meningitis) 0.25 SENSITIVE Sensitive     * RARE STREPTOCOCCUS PNEUMONIAE  Culture, fungus without smear     Status: None (Preliminary result)   Collection Time: 09/05/22  5:56 PM   Specimen: CSF; Cerebrospinal Fluid  Result Value Ref Range Status   Specimen Description CSF  Final   Special Requests LP  Final   Culture   Final    NO FUNGUS ISOLATED AFTER 7 DAYS Performed at [facility], [address], [place], [place] [address]    Report Status PENDING  Incomplete  MRSA Next Gen by PCR, Nasal     Status: None   Collection Time: 09/05/22  6:41 PM   Specimen: Nasal Mucosa; Nasal Swab  Result Value Ref Range Status   MRSA by PCR Next Gen NOT DETECTED NOT DETECTED Final    Comment: (NOTE) The GeneXpert MRSA Assay (FDA approved for NASAL specimens only), is one component of a comprehensive MRSA colonization surveillance program. It is not intended to diagnose MRSA infection nor to guide or monitor treatment for MRSA infections. Test performance is not FDA approved in patients less than 33 years old. Performed at [facility], [address], [place], [place] [address]   Culture, blood (Routine X 2)     Status: None   Collection Time: 09/05/22  6:53 PM   Specimen: BLOOD  Result Value Ref Range Status   Specimen Description BLOOD RIGHT ANTECUBITAL  Final   Special Requests   Final    BOTTLES DRAWN AEROBIC AND ANAEROBIC Blood Culture adequate volume    Culture   Final    NO GROWTH 5 DAYS Performed at [facility], [address] [place], [place] [address]    Report Status 09/10/2022 FINAL  Final  Culture, blood (Routine X 2)     Status: None   Collection Time: 09/05/22  6:55  PM   Specimen: BLOOD  Result Value Ref Range Status   Specimen Description BLOOD SITE NOT SPECIFIED  Final   Special Requests   Final    BOTTLES DRAWN AEROBIC AND ANAEROBIC Blood Culture results may not be optimal due to an inadequate volume of blood received in culture bottles   Culture   Final    NO GROWTH 5 DAYS Performed at [facility], [address], [place], [place] [address]    Report Status 09/10/2022 FINAL  Final  Culture, blood (Routine X 2) w Reflex to ID Panel     Status: None   Collection Time: 09/06/22 11:40 PM   Specimen: BLOOD  Result Value Ref Range Status   Specimen Description BLOOD BLOOD RIGHT HAND  Final   Special Requests   Final    BOTTLES DRAWN AEROBIC AND ANAEROBIC Blood Culture adequate volume   Culture   Final    NO GROWTH 5 DAYS Performed at [facility], [address] [place], [place] [address]    Report Status 09/11/2022 FINAL  Final  Culture, blood (Routine X 2) w Reflex to ID Panel     Status: None   Collection Time: 09/06/22 11:40 PM   Specimen: BLOOD  Result Value Ref Range Status   Specimen Description BLOOD BLOOD LEFT HAND  Final   Special Requests   Final    BOTTLES DRAWN AEROBIC AND ANAEROBIC Blood Culture adequate volume   Culture   Final    NO GROWTH 5 DAYS Performed at [facility], [address] [place], [place] [address]    Report Status 09/11/2022 FINAL  Final    Impression/Plan:  1. Infective endocarditis - large vegetation noted on the mitral valve with perforation.  Will need prolonged IV antibiotics with ceftriaxone through August 12. Will need reevaluation of the valve with TTE after treatment completion with follow up with cardiology and TCTS.    2.  Meningitis - on  ceftriaxone for pneumococcal meningitis.  Will continue with CNS dosing  Penicillin resistance so will keep him on ceftriaxone  3.  CNS emboli - cw embolic disease from infective endocarditis.  Remains on ceftriaxone and will continue.

## 2022-09-14 LAB — BASIC METABOLIC PANEL
Anion gap: 7 (ref 5–15)
BUN: 9 mg/dL (ref 8–23)
CO2: 25 mmol/L (ref 22–32)
Calcium: 7.8 mg/dL — ABNORMAL LOW (ref 8.9–10.3)
Chloride: 100 mmol/L (ref 98–111)
Creatinine, Ser: 0.59 mg/dL — ABNORMAL LOW (ref 0.61–1.24)
GFR, Estimated: >60 mL/min (ref >60)
Glucose, Bld: 100 mg/dL — ABNORMAL HIGH (ref 70–99)
Potassium: 3.9 mmol/L (ref 3.5–5.1)
Sodium: 132 mmol/L — ABNORMAL LOW (ref 135–145)

## 2022-09-14 MED ORDER — ESCITALOPRAM OXALATE 10 MG PO TABS
10.0000 mg | ORAL_TABLET | Freq: Every day | ORAL | Status: DC
  Administered 2022-09-14 – 2022-09-27 (×14): 10 mg via ORAL
  Filled 2022-09-14 (×14): qty 1

## 2022-09-14 MED ORDER — SODIUM CHLORIDE 0.9 % IV SOLN
2.0000 g | INTRAVENOUS | Status: AC
  Administered 2022-09-27 – 2022-10-17 (×21): 2 g via INTRAVENOUS
  Filled 2022-09-14 (×21): qty 20

## 2022-09-14 MED ORDER — SODIUM CHLORIDE 0.9 % IV SOLN: 2.0000 g | INTRAVENOUS | Status: DC

## 2022-09-14 NOTE — Assessment & Plan Note (Addendum)
At baseline according to mom, patient is very quiet and doesn't speak much. - assess mental status daily to monitor improvement - STAT head CT for any change in neuro exam  - Started Lexapro 10 mg to help his mood

## 2022-09-14 NOTE — Assessment & Plan Note (Signed)
Continue Biktarvy. 

## 2022-09-14 NOTE — Progress Notes (Signed)
Physical Therapy Treatment Patient Details Name: Christopher Farrell MRN: [id number] DOB: Jul 26, 1960 Today's Date: 09/14/2022   History of Present Illness Pt is a 62 y/o male presenting on 7/1 with cough and AMS. Noted presented to ED on 6/26 and found with multifocal pneumonia. Returned to ED after being found unresponsive, admitted with sepsis. 2 seizures in ED. CSF reveals bacterial meningitis. MRI with "multifocal acute/subacute ischemia within the right greater than left hemispheres and left cerebellum. Largest lesions in R parietal and occipital lobes". Intubated 7/1-7/3. PMH: HIV.    PT Comments  Progressing well towards functional goals, which were updated today based on improvements. Ambulating with single UE support, still declines to try [facility] but suspect this would be an appropriate AD to progress towards at this time. Delayed processing, following 75% of commands accurately with dynamic gait challenges. Patient will continue to benefit from skilled physical therapy services to further improve independence with functional mobility.      Assistance Recommended at Discharge Frequent or constant Supervision/Assistance  If plan is discharge home, recommend the following:  Can travel by private vehicle    Direct supervision/assist for medications management;Direct supervision/assist for financial management;Assist for transportation;A little help with walking and/or transfers;A little help with bathing/dressing/bathroom      Equipment Recommendations  BSC/3in1;Rolling walker (2 wheels) (pending progress)    Recommendations for Other Services       Precautions / Restrictions Precautions Precautions: Fall Restrictions Weight Bearing Restrictions: No     Mobility  Bed Mobility Overal bed mobility: Modified Independent             General bed mobility comments: Mod I, extra time, good stability, no assist today.    Transfers Overall transfer level: Needs  assistance Equipment used: Rolling walker (2 wheels) Transfers: Sit to/from Stand Sit to Stand: Min guard           General transfer comment: Min guard for safety, cues for hand placement. Slow to rise, trunk flexed over RW initially.    Ambulation/Gait Ambulation/Gait assistance: Min guard Gait Distance (Feet): 210 Feet Assistive device: Rolling walker (2 wheels) Gait Pattern/deviations: Step-through pattern, Trunk flexed, Decreased stride length Gait velocity: decreased Gait velocity interpretation: <1.31 ft/sec, indicative of household ambulator   General Gait Details: Declined to try walking with SPC, but agreeable to try single UE support with gait, required min guard for safety. delayed processing when following simple instructions but accurate 75% of time when following through with directed task. Intermittent cues for upright posture. No buckling noted. Difficulty sequencing with backwards steps using RW. Challenged balance with horiziontal and vertical head turns.   Stairs             Wheelchair Mobility     Tilt Bed    Modified Rankin (Stroke Patients Only) Modified Rankin (Stroke Patients Only) Pre-Morbid Rankin Score: No symptoms Modified Rankin: Moderate disability     Balance Overall balance assessment: Needs assistance Sitting-balance support: Feet supported Sitting balance-Leahy Scale: Good     Standing balance support: Single extremity supported, Reliant on assistive device for balance Standing balance-Leahy Scale: Poor Standing balance comment: Suspect able to balance without UE support however decliens at this time.                            Cognition Arousal/Alertness: Awake/alert Behavior During Therapy: Flat affect Overall Cognitive Status: Impaired/Different from baseline Area of Impairment: Attention, Memory, Following commands, Safety/judgement, Problem solving  Current Attention Level:  Sustained Memory: Decreased recall of precautions, Decreased short-term memory Following Commands: Follows one step commands with increased time Safety/Judgement: Decreased awareness of safety, Decreased awareness of deficits   Problem Solving: Slow processing, Decreased initiation, Difficulty sequencing, Requires verbal cues          Exercises      General Comments        Pertinent Vitals/Pain Pain Assessment Pain Assessment: No/denies pain Pain Intervention(s): Monitored during session    Home Living                          Prior Function            PT Goals (current goals can now be found in the care plan section) Acute Rehab PT Goals Patient Stated Goal: did not state PT Goal Formulation: With patient/family Time For Goal Achievement: 09/22/22 Potential to Achieve Goals: Good Progress towards PT goals: Progressing toward goals    Frequency    Min 3X/week      PT Plan Current plan remains appropriate    Co-evaluation              AM-PAC PT "6 Clicks" Mobility   Outcome Measure  Help needed turning from your back to your side while in a flat bed without using bedrails?: None Help needed moving from lying on your back to sitting on the side of a flat bed without using bedrails?: None Help needed moving to and from a bed to a chair (including a wheelchair)?: A Little Help needed standing up from a chair using your arms (e.g., wheelchair or bedside chair)?: A Little Help needed to walk in hospital room?: A Little Help needed climbing 3-5 steps with a railing? : A Lot 6 Click Score: 19    End of Session Equipment Utilized During Treatment: Gait belt Activity Tolerance: Patient tolerated treatment well Patient left: with call bell/phone within reach;in bed;with bed alarm set   PT Visit Diagnosis: Unsteadiness on feet (R26.81);Other abnormalities of gait and mobility (R26.89);Muscle weakness (generalized) (M62.81);Difficulty in walking,  not elsewhere classified (R26.2)     Time: 1610-9604 PT Time Calculation (min) (ACUTE ONLY): 15 min  Charges:    $Gait Training: 8-22 mins PT General Charges $$ ACUTE PT VISIT: 1 Visit                     Kathlyn Sacramento, PT, DPT [facility]  Rehabilitation Services Physical Therapist Office: [phone] Website: [email or web address]    Berton Mount 09/14/2022, 12:14 PM

## 2022-09-14 NOTE — Assessment & Plan Note (Addendum)
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 LVEF 60-65%. TEE showed a large vegetation of the anterior mitral leaflet, mostly fixed, but has mobile components. There is an associated perforation of the anterior leaflet with severe eccentric mitral insufficiency, with a highly eccentric posteriorly-directed jet.  - Continue Rocephin 

## 2022-09-14 NOTE — Progress Notes (Signed)
Speech Language Pathology Treatment: Dysphagia;Cognitive-Linquistic  Patient Details Name: Christopher Farrell MRN: [id number] DOB: [birth year] Today's Date: 09/14/2022 Time: 0865-7846 SLP Time Calculation (min) (ACUTE ONLY): 21 min  Assessment / Plan / Recommendation Clinical Impression  Mr. Moser demonstrated much improved ability to attend to and masticate solids with no further s/s of dysphagia.  He self fed thin liquids with no concerns for aspiration.  Cognitively, he continues to present with flat affect, delayed response time, poor initiation. During problem solving task, he was able to scan through menu and identify lunch item, but need moderate assist to find and dial phone number.  He was unable to hear communication through phone. Denied hearing loss; denied adamantly that he had had a stroke.    No further f/u is needed for swallowing. He was advanced to a regular diet/thin liquids. Will need assist with tray set-up.  Will follow 1x/week for cognition.    HPI HPI: Pt is a 62 y/o male presenting on 7/1 with cough and AMS. 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 Dx infective endocarditis - will require six weeks of abx, ending 8/12. Intubated 7/1-3.  MRI with "multifocal acute/subacute ischemia within the right greater than left hemispheres and left cerebellum. Largest lesions in R parietal and occipital lobes".  PMH: HIV.      SLP Plan  Continue with current plan of care      Recommendations for follow up therapy are one component of a multi-disciplinary discharge planning process, led by the attending physician.  Recommendations may be updated based on patient status, additional functional criteria and insurance authorization.    Recommendations  Diet recommendations: Regular;Thin liquid Liquids provided via: Cup;Straw Medication Administration: Whole meds with liquid Compensations: Minimize environmental distractions                   Oral care BID   Frequent or constant Supervision/Assistance Dysphagia, unspecified (R13.10);Cognitive communication deficit (R41.841)     Continue with current plan of care    Burnell Hurta L. Samson Frederic, MA CCC/SLP Clinical Specialist - Acute Care SLP Acute Rehabilitation Services Office number [phone]  Blenda Mounts Laurice  09/14/2022, 10:03 AM

## 2022-09-14 NOTE — Progress Notes (Signed)
Nutrition Follow-up  DOCUMENTATION CODES:  Severe malnutrition in context of chronic illness  INTERVENTION:  Continue current diet as recommended per SLP Ordering assistance Tray set-up assistance Ensure Enlive po TID, each supplement provides 350 kcal and 20 grams of protein. Mighty Shake TID, each supplement provides 330kcal and 9g protein Magic cup TID with meals, each supplement provides 290 kcal and 9 grams of protein MVI with minerals daily, thiamine daily If intake does not increase, pt would likely benefit from having cortrak replaced  NUTRITION DIAGNOSIS:  Severe Malnutrition related to chronic illness (HIV) as evidenced by severe fat depletion, severe muscle depletion. - remains applicable  GOAL:  Patient will meet greater than or equal to 90% of their needs - not progressing, pulled cortrak, poor PO  MONITOR:  PO intake, Supplement acceptance, Diet advancement, Labs  REASON FOR ASSESSMENT:  Consult Assessment of nutrition requirement/status  ASSESSMENT:  Pt with hx of HIV (not compliant with antivirals) and EtOH/tobacco abuse presented to ED with AMS after recently being diagnosed with pneumonia. Found to be septic and then suffered a seizure in ED.  7/1 - intubated  7/3 - cortrak placed (mid to distal stomach), extubated, pt pulled cortrak during PM shift 7/4 - SLP bedside evaluation, DYS1/thin liquids 7/8 - TEE findings, Large vegetation of the anterior mitral leaflet. There is an associated perforation of the anterior leaflet with severe eccentric mitral insufficiency, with a highly eccentric posteriorly-directed jet.   7/10 - SLP evaluation, advanced to regular with thin liquids  Pt resting in bed at the time of assessment. Awake and alert today. States that he ordered spaghetti for lunch. Pt does report weight loss, unsure of timeframe but does state he used to weight ~150 lbs. Pt states he likes the ensure but only likes chocolate. Agreeable magic cup.    Average Meal Intake: 7/4-7/7: 9% intake x 6 recorded meals 7/9-7/10: 20% intake x 4 recorded meals  Nutritionally Relevant Medications: Scheduled Meds:  bictegravir-emtricitabine-tenofovir AF  1 tablet Daily   Ensure Enlive  237 mL TID BM   thiamine  100 mg Daily   Continuous Infusions:  cefTRIAXone (ROCEPHIN)  IV Stopped (09/14/22 0805)   [START ON 09/27/2022] cefTRIAXone (ROCEPHIN)  IV     Labs Reviewed: Na 132 Creatinine 0.59 CBG ranges from 92-121 mg/dL over the last 24 hours HgbA1c 6.4%  NUTRITION - FOCUSED PHYSICAL EXAM: Flowsheet Row Most Recent Value  Orbital Region Unable to assess  [forehead obsured by equipment]  Upper Arm Region Severe depletion  Thoracic and Lumbar Region Severe depletion  Buccal Region Unable to assess  [ETT holder blocking view]  Temple Region Unable to assess  [forehead obsecured by equipement]  Clavicle Bone Region Moderate depletion  Clavicle and Acromion Bone Region Severe depletion  Scapular Bone Region Severe depletion  Dorsal Hand Moderate depletion  Patellar Region Severe depletion  Anterior Thigh Region Severe depletion  Posterior Calf Region Severe depletion  Edema (RD Assessment) None  Hair Reviewed  Eyes Unable to assess  Mouth Reviewed  Skin Reviewed  Nails Reviewed    Diet Order:   Diet Order             Diet regular Room service appropriate? Yes with Assist; Fluid consistency: Thin  Diet effective now                   EDUCATION NEEDS:  Not appropriate for education at this time  Skin:  Skin Assessment: Reviewed RN Assessment  Last BM:  7/10 -  type 6  Height:  Ht Readings from Last 1 Encounters:  09/13/22 5\' 4"  (1.626 m)    Weight:  Wt Readings from Last 1 Encounters:  09/13/22 49 kg    Ideal Body Weight:  59.1 kg  BMI:  Body mass index is 18.54 kg/m.  Estimated Nutritional Needs:  Kcal:  1700-2000 kcal/d Protein:  85-100 g/d Fluid:  >/=1.8L/d    Greig Castilla, RD, LDN Clinical  Dietitian RD pager # available in [facility]  After hours/weekend pager # available in [facility]

## 2022-09-14 NOTE — Plan of Care (Signed)
  Problem: Education: Goal: Knowledge of disease or condition will improve Outcome: Progressing   Problem: Ischemic Stroke/TIA Tissue Perfusion: Goal: Complications of ischemic stroke/TIA will be minimized Outcome: Progressing   Problem: Health Behavior/Discharge Planning: Goal: Ability to manage health-related needs will improve Outcome: Progressing Goal: Goals will be collaboratively established with patient/family Outcome: Progressing   Problem: Self-Care: Goal: Ability to participate in self-care as condition permits will improve Outcome: Progressing   Problem: Nutrition: Goal: Dietary intake will improve Outcome: Progressing

## 2022-09-14 NOTE — Progress Notes (Signed)
Occupational Therapy Treatment Patient Details Name: Christopher Farrell MRN: [id number] DOB: 04-04-1960 Today's Date: 09/14/2022   History of present illness Pt is a 62 y/o male presenting on 7/1 with cough and AMS. Noted presented to ED on 6/26 and found with multifocal pneumonia. Returned to ED after being found unresponsive, admitted with sepsis. 2 seizures in ED. CSF reveals bacterial meningitis. MRI with "multifocal acute/subacute ischemia within the right greater than left hemispheres and left cerebellum. Largest lesions in R parietal and occipital lobes". Intubated 7/1-7/3. PMH: HIV.   OT comments  Pt meeting goals this session and upgraded goals. Pt completed task after encouragement from mother. Pt reports working at [employer] [place] at baseline". Recommendation update due to CIR declined admission.    Recommendations for follow up therapy are one component of a multi-disciplinary discharge planning process, led by the attending physician.  Recommendations may be updated based on patient status, additional functional criteria and insurance authorization.    Assistance Recommended at Discharge Frequent or constant Supervision/Assistance  Patient can return home with the following  Assistance with cooking/housework;Direct supervision/assist for medications management;Direct supervision/assist for financial management;Help with stairs or ramp for entrance;Assist for transportation;A little help with walking and/or transfers;A little help with bathing/dressing/bathroom   Equipment Recommendations  None recommended by OT    Recommendations for Other Services      Precautions / Restrictions Precautions Precautions: Fall       Mobility Bed Mobility Overal bed mobility: Modified Independent                  Transfers Overall transfer level: Needs assistance   Transfers: Sit to/from Stand Sit to Stand: Min guard                 Balance Overall  balance assessment: Needs assistance         Standing balance support: Single extremity supported, During functional activity, Reliant on assistive device for balance Standing balance-Leahy Scale: Poor                 High Level Balance Comments: pt with LOB with head turns in walking in hall. pt scan environment and saw a container of mints and requested one. pt declines use of DME cane and states no. pt with widen base of support and not neck rotation with ambulation           ADL either performed or assessed with clinical judgement   ADL Overall ADL's : Needs assistance/impaired Eating/Feeding: Set up;Bed level               Upper Body Dressing : Minimal assistance;Sitting   Lower Body Dressing: Moderate assistance   Toilet Transfer: Min guard;Ambulation;Regular Toilet           Functional mobility during ADLs: Min guard (declines use of DME)      Extremity/Trunk Assessment Upper Extremity Assessment Upper Extremity Assessment: Overall WFL for tasks assessed (abel to open plastic mint wrapper)   Lower Extremity Assessment Lower Extremity Assessment: Defer to PT evaluation        Vision       Perception     Praxis      Cognition Arousal/Alertness: Awake/alert Behavior During Therapy: Flat affect Overall Cognitive Status: Impaired/Different from baseline Area of Impairment: Memory                   Current Attention Level: Sustained         Problem Solving: Slow processing General  Comments: pt very limited verbalizations and delayed responses. pt motivated by mother encouragement and mumbles in disagreement with participation but completes task        Exercises      Shoulder Instructions       General Comments VSS on ra    Pertinent Vitals/ Pain       Pain Assessment Pain Assessment: No/denies pain  Home Living                                          Prior Functioning/Environment               Frequency  Min 2X/week        Progress Toward Goals  OT Goals(current goals can now be found in the care plan section)  Progress towards OT goals: Progressing toward goals  Acute Rehab OT Goals Patient Stated Goal: to get a mint OT Goal Formulation: Patient unable to participate in goal setting Time For Goal Achievement: 09/22/22 Potential to Achieve Goals: Fair ADL Goals Pt Will Perform Grooming: sitting;with min assist Pt Will Perform Upper Body Dressing: with min assist;sitting Pt Will Transfer to Toilet: with mod assist;bedside commode Additional ADL Goal #1: Patient will complete bed mobility with min assist and maintain sitting balance at EOB with min guard for 5 minutes as precursor to ADLs. (met) Additional ADL Goal #2: Pt will follow 1 step commands with 90% accuracy given increased time.  Plan Discharge plan needs to be updated    Co-evaluation                 AM-PAC OT "6 Clicks" Daily Activity     Outcome Measure   Help from another person eating meals?: A Little Help from another person taking care of personal grooming?: A Little Help from another person toileting, which includes using toliet, bedpan, or urinal?: A Lot Help from another person bathing (including washing, rinsing, drying)?: A Lot Help from another person to put on and taking off regular upper body clothing?: A Lot Help from another person to put on and taking off regular lower body clothing?: A Lot 6 Click Score: 14    End of Session Equipment Utilized During Treatment: Gait belt  OT Visit Diagnosis: Other abnormalities of gait and mobility (R26.89);Muscle weakness (generalized) (M62.81);Pain;Other symptoms and signs involving cognitive function;Cognitive communication deficit (R41.841);Other symptoms and signs involving the nervous system (R29.898)   Activity Tolerance Patient tolerated treatment well   Patient Left in bed;with call bell/phone within reach;with bed alarm set;with  nursing/sitter in room;with family/visitor present   Nurse Communication Mobility status;Precautions        Time: 0981-1914 OT Time Calculation (min): 20 min  Charges: OT General Charges $OT Visit: 1 Visit OT Treatments $Self Care/Home Management : 8-22 mins   Brynn, OTR/L  Acute Rehabilitation Services Office: [phone] .   [name] 09/14/2022, 4:28 PM

## 2022-09-14 NOTE — Progress Notes (Addendum)
Daily Progress Note Intern Pager: [phone]  Patient name: Christopher Farrell Medical record number: [id number] Date of birth: June 07, 1960 Age: 62 y.o. Gender: male  Primary Care Provider: Pcp, No Consultants: Cardiology, CCM, ID, Neurology Code Status: Full Code  Pt Overview and Major Events to Date:  7/1: Admission and Intubation 7/2: Brain MRI 7/4: Extubated 7/5: Transferred to FMTS  7/8: TEE 7/9: Prolonged IV Rocephin for 6 weeks total  Assessment and Plan: Christopher Farrell is a 62 y.o. 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 Pertinent PMH/PSH includes HIV not on antiretrovirals.   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 Due to the vegetation  seen on his TEE, we will require a prolonged course of abx for 6 weeks.  Patient has no insurance and has a history of drug abuse so he is not a  candidate for CIR or SNF or IV access at home. - Continue Rocephin, will require 6 weeks of abx therapy due to vegetation  on TEE (Day 10; 7/1-8/12) - Monitor BMP weekly        Altered mental status     At baseline according to mom, patient is very quiet and doesn't speak  much. 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  LVEF 60-65%. TEE showed a large vegetation of the anterior mitral leaflet,  mostly fixed, but has mobile components. There is an associated  perforation of the anterior leaflet with severe eccentric mitral  insufficiency, with a highly  eccentric posteriorly-directed jet.  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 - continue Rocephin      FEN/GI: Heart diet PPx: Lovenox Dispo:pending IV abx therapy  Subjective:  Patient did well overnight and has no complaints this morning. He understands why he needs to stay in the hospital for a longer duration.  Objective: Temp:  [98.4 F (36.9 C)-98.9 F (37.2 C)] 98.9 F (37.2 C) (07/10 0724) Pulse Rate:  [83-93] 83 (07/10 0724) Resp:  [15-20] 15 (07/09 1953) BP: (83-94)/(59-71) 94/71 (07/10 0724) SpO2:  [99 %-100 %] 100 % (07/10 0724) Weight:  [49 kg] 49 kg (07/09 1116)  Physical Exam: General: NAD, awake and alert. Cardiovascular: RRR. Mitral regurg murmur. Respiratory: CTAB, normal WOB on RA Abdomen: soft, nontender, nondistended Extremities: no BLE edema Neuro: A&Ox3. No focal neurological deficits.  Laboratory: Most recent CBC Lab Results  Component Value Date   WBC 8.0 09/12/2022   HGB 9.8 (L) 09/12/2022   HCT 29.0 (L) 09/12/2022   MCV 85.5 09/12/2022   PLT 322 09/12/2022   Most recent BMP    Latest Ref Rng & Units 09/13/2022   11:49 PM  BMP  Glucose 70 - 99 mg/dL 147   BUN 8 - 23 mg/dL 9   Creatinine 8.29 - 5.62 mg/dL 1.30  Sodium 135 - 145 mmol/L 132   Potassium 3.5 - 5.1 mmol/L 3.9   Chloride 98 - 111 mmol/L 100   CO2 22 - 32 mmol/L 25   Calcium 8.9 - 10.3 mg/dL 7.8     Imaging/Diagnostic Tests: No new imaging.  Fortunato Curling, DO 09/14/2022, 10:59 AM  PGY-1, [facility] FPTS Intern pager: [phone], text pages welcome Secure chat group [facility] Teaching Service

## 2022-09-15 LAB — GLUCOSE, CAPILLARY: Glucose-Capillary: 86 mg/dL (ref 70–99)

## 2022-09-15 NOTE — Progress Notes (Signed)
Daily Progress Note Intern Pager: [phone]  Patient name: Christopher Farrell Medical record number: [id number] Date of birth: 01/03/1961 Age: 62 y.o. Gender: male  Primary Care Provider: Pcp, No Consultants: Cardiology, CCM, ID, Neurology Code Status: Full Code  Pt Overview and Major Events to Date:  7/1: Admission and Intubation 7/2: Brain MRI 7/4: Extubated 7/5: Transferred to FMTS  7/8: TEE 7/9: Prolonged IV Rocephin for 6 weeks total 7/10: Started Lexapro 10 mg  Assessment and Plan: Christopher Farrell is a 62 y.o. 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 Pertinent PMH/PSH includes HIV not on antiretrovirals.      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 Due to the vegetation  seen on his TEE, we will require a prolonged course of abx for 6 weeks.  Patient has no insurance and has a history of drug abuse so he is not a  candidate for CIR or SNF or IV access at home. - Continue Rocephin, will require 6 weeks of abx therapy due to vegetation  on TEE (Day 10; 7/1-8/12) - Monitor BMP weekly        Altered mental status     At baseline according to mom, patient is very quiet and doesn't speak  much. 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  LVEF 60-65%. TEE showed a large vegetation of the anterior mitral leaflet,  mostly fixed, but has mobile components. There is an associated  perforation of the anterior leaflet with severe eccentric mitral  insufficiency, with a highly  eccentric posteriorly-directed jet.  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 - continue Rocephin       FEN/GI: Heart diet PPx: Lovenox Dispo:pending IV abx therapy   Subjective:  Patient did well overnight and has no complaints this morning. He seems to be more responsive this morning compared to yesterday and feels as if he is doing well.   Objective: Temp:  [98.2 F (36.8 C)-98.7 F (37.1 C)] 98.7 F (37.1 C) (07/11 0723) Pulse Rate:  [82-88] 84 (07/11 0723) Resp:  [17-18] 18 (07/11 0723) BP: (90-94)/(63-75) 93/72 (07/11 0723) SpO2:  [97 %-100 %] 99 % (07/11 0723)  Physical Exam: General: NAD, awake and alert. Cardiovascular: RRR. Mitral regurg murmur. Respiratory: CTAB, normal WOB on RA Abdomen: soft, nontender, nondistended Extremities: no BLE edema Neuro: A&Ox3. No focal neurological deficits.  Laboratory: Most recent CBC Lab Results  Component Value Date   WBC 8.0 09/12/2022   HGB 9.8 (L) 09/12/2022   HCT 29.0 (L) 09/12/2022   MCV 85.5 09/12/2022   PLT 322 09/12/2022   Most recent BMP    Latest Ref Rng & Units 09/13/2022   11:49 PM  BMP  Glucose 70 - 99 mg/dL 308   BUN 8 - 23 mg/dL 9   Creatinine 6.57 - 8.46 mg/dL 9.62  Sodium 135 - 145 mmol/L 132   Potassium 3.5 - 5.1 mmol/L 3.9   Chloride 98 - 111 mmol/L 100   CO2 22 - 32 mmol/L 25   Calcium 8.9 - 10.3 mg/dL 7.8     Imaging/Diagnostic Tests: No new imaging.  Fortunato Curling, DO 09/15/2022, 9:37 AM  PGY-1, [facility] FPTS Intern pager: [phone], text pages welcome Secure chat group [facility] Teaching Service

## 2022-09-15 NOTE — Assessment & Plan Note (Signed)
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 - continue Rocephin

## 2022-09-15 NOTE — Assessment & Plan Note (Signed)
Continue Biktarvy. 

## 2022-09-15 NOTE — Plan of Care (Signed)
  Problem: Coping: Goal: Ability to adjust to condition or change in health will improve Outcome: Progressing   Problem: Fluid Volume: Goal: Ability to maintain a balanced intake and output will improve Outcome: Progressing   Problem: Health Behavior/Discharge Planning: Goal: Ability to identify and utilize available resources and services will improve Outcome: Progressing Goal: Ability to manage health-related needs will improve Outcome: Progressing   Problem: Skin Integrity: Goal: Risk for impaired skin integrity will decrease Outcome: Progressing   Problem: Tissue Perfusion: Goal: Adequacy of tissue perfusion will improve Outcome: Progressing   

## 2022-09-15 NOTE — Progress Notes (Signed)
[facility]   Reason for visit: Follow up on bacteremia  Interval History: repeat blood cultures from 7/2 have remained negative.  No new complaints.    Physical Exam: Constitutional:  Vitals:   09/15/22 0517 09/15/22 0723  BP: 90/63 93/72  Pulse: 85 84  Resp: 18 18  Temp: 98.2 F (36.8 C) 98.7 F (37.1 C)  SpO2: 98% 99%   patient appears in NAD Respiratory: Normal respiratory effort  Review of Systems: Constitutional: negative for fevers and chills  Lab Results  Component Value Date   WBC 8.0 09/12/2022   HGB 9.8 (L) 09/12/2022   HCT 29.0 (L) 09/12/2022   MCV 85.5 09/12/2022   PLT 322 09/12/2022    Lab Results  Component Value Date   CREATININE 0.59 (L) 09/13/2022   BUN 9 09/13/2022   NA 132 (L) 09/13/2022   K 3.9 09/13/2022   CL 100 09/13/2022   CO2 25 09/13/2022    Lab Results  Component Value Date   ALT 36 09/05/2022   AST 94 (H) 09/05/2022   ALKPHOS 42 09/05/2022     Microbiology: Recent Results (from the past 240 hour(s))  Resp panel by RT-PCR (RSV, Flu A&B, Covid) Anterior Nasal Swab     Status: None   Collection Time: 09/05/22 11:13 AM   Specimen: Anterior Nasal Swab  Result Value Ref Range Status   SARS Coronavirus 2 by RT PCR NEGATIVE NEGATIVE Final   Influenza A by PCR NEGATIVE NEGATIVE Final   Influenza B by PCR NEGATIVE NEGATIVE Final    Comment: (NOTE) The Xpert Xpress SARS-CoV-2/FLU/RSV plus assay is intended as an aid in the diagnosis of influenza from Nasopharyngeal swab specimens and should not be used as a sole basis for treatment. Nasal washings and aspirates are unacceptable for Xpert Xpress SARS-CoV-2/FLU/RSV testing.  Fact Sheet for Patients: BloggerCourse.com  Fact Sheet for Healthcare Providers: SeriousBroker.it  This test is not yet approved or cleared by the [place] FDA and has been authorized for detection and/or diagnosis of  SARS-CoV-2 by FDA under an Emergency Use Authorization (EUA). This EUA will remain in effect (meaning this test can be used) for the duration of the COVID-19 declaration under Section 564(b)(1) of the Act, 21 U.S.C. section 360bbb-3(b)(1), unless the authorization is terminated or revoked.     Resp Syncytial Virus by PCR NEGATIVE NEGATIVE Final    Comment: (NOTE) Fact Sheet for Patients: BloggerCourse.com  Fact Sheet for Healthcare Providers: SeriousBroker.it  This test is not yet approved or cleared by the [place] FDA and has been authorized for detection and/or diagnosis of SARS-CoV-2 by FDA under an Emergency Use Authorization (EUA). This EUA will remain in effect (meaning this test can be used) for the duration of the COVID-19 declaration under Section 564(b)(1) of the Act, 21 U.S.C. section 360bbb-3(b)(1), unless the authorization is terminated or revoked.  Performed at [facility], [address] [place], [place] [address]   Blood Culture (routine x 2)     Status: Abnormal   Collection Time: 09/05/22 11:13 AM   Specimen: BLOOD RIGHT ARM  Result Value Ref Range Status   Specimen Description BLOOD RIGHT ARM  Final   Special Requests   Final    BOTTLES DRAWN AEROBIC AND ANAEROBIC Blood Culture results may not be optimal due to an excessive volume of blood received in culture bottles   Culture  Setup Time   Final    GRAM POSITIVE COCCI IN BOTH  AEROBIC AND ANAEROBIC BOTTLES CRITICAL RESULT CALLED TO, READ BACK BY AND VERIFIED WITH: PHARMD H. WILSON 09/06/22 @ 0036 BY AB Performed at [facility], [address] [place], [place] [address]    Culture STREPTOCOCCUS PNEUMONIAE (A)  Final   Report Status 09/08/2022 FINAL  Final   Organism ID, Bacteria STREPTOCOCCUS PNEUMONIAE  Final      Susceptibility   Streptococcus pneumoniae - MIC*    ERYTHROMYCIN >=8 RESISTANT Resistant     LEVOFLOXACIN 0.5 SENSITIVE  Sensitive     VANCOMYCIN 0.25 SENSITIVE Sensitive     PENICILLIN (meningitis) 0.5 RESISTANT Resistant     PENO - penicillin 0.5      PENICILLIN (non-meningitis) 0.5 SENSITIVE Sensitive     PENICILLIN (oral) 0.5 INTERMEDIATE Intermediate     CEFTRIAXONE (non-meningitis) 0.25 SENSITIVE Sensitive     CEFTRIAXONE (meningitis) 0.25 SENSITIVE Sensitive     * STREPTOCOCCUS PNEUMONIAE  Blood Culture ID Panel (Reflexed)     Status: Abnormal   Collection Time: 09/05/22 11:13 AM  Result Value Ref Range Status   Enterococcus faecalis NOT DETECTED NOT DETECTED Final   Enterococcus Faecium NOT DETECTED NOT DETECTED Final   Listeria monocytogenes NOT DETECTED NOT DETECTED Final   Staphylococcus species NOT DETECTED NOT DETECTED Final   Staphylococcus aureus (BCID) NOT DETECTED NOT DETECTED Final   Staphylococcus epidermidis NOT DETECTED NOT DETECTED Final   Staphylococcus lugdunensis NOT DETECTED NOT DETECTED Final   Streptococcus species DETECTED (A) NOT DETECTED Final    Comment: CRITICAL RESULT CALLED TO, READ BACK BY AND VERIFIED WITH: PHARMD H. WILSON 09/06/22 @ 0036 BY AB    Streptococcus agalactiae NOT DETECTED NOT DETECTED Final   Streptococcus pneumoniae DETECTED (A) NOT DETECTED Final    Comment: CRITICAL RESULT CALLED TO, READ BACK BY AND VERIFIED WITH: PHARMD H. WILSON 09/06/22 @ 0036 BY AB    Streptococcus pyogenes NOT DETECTED NOT DETECTED Final   A.calcoaceticus-baumannii NOT DETECTED NOT DETECTED Final   Bacteroides fragilis NOT DETECTED NOT DETECTED Final   Enterobacterales NOT DETECTED NOT DETECTED Final   Enterobacter cloacae complex NOT DETECTED NOT DETECTED Final   Escherichia coli NOT DETECTED NOT DETECTED Final   Klebsiella aerogenes NOT DETECTED NOT DETECTED Final   Klebsiella oxytoca NOT DETECTED NOT DETECTED Final   Klebsiella pneumoniae NOT DETECTED NOT DETECTED Final   Proteus species NOT DETECTED NOT DETECTED Final   Salmonella species NOT DETECTED NOT DETECTED Final    Serratia marcescens NOT DETECTED NOT DETECTED Final   Haemophilus influenzae NOT DETECTED NOT DETECTED Final   Neisseria meningitidis NOT DETECTED NOT DETECTED Final   Pseudomonas aeruginosa NOT DETECTED NOT DETECTED Final   Stenotrophomonas maltophilia NOT DETECTED NOT DETECTED Final   Candida albicans NOT DETECTED NOT DETECTED Final   Candida auris NOT DETECTED NOT DETECTED Final   Candida glabrata NOT DETECTED NOT DETECTED Final   Candida krusei NOT DETECTED NOT DETECTED Final   Candida parapsilosis NOT DETECTED NOT DETECTED Final   Candida tropicalis NOT DETECTED NOT DETECTED Final   Cryptococcus neoformans/gattii NOT DETECTED NOT DETECTED Final    Comment: Performed at [facility], [address] [place], [place] [address]  Blood Culture (routine x 2)     Status: None   Collection Time: 09/05/22  1:28 PM   Specimen: BLOOD LEFT HAND  Result Value Ref Range Status   Specimen Description BLOOD LEFT HAND  Final   Special Requests   Final    BOTTLES DRAWN AEROBIC AND ANAEROBIC  Blood Culture results may not be optimal due to an inadequate volume of blood received in culture bottles   Culture   Final    NO GROWTH 5 DAYS Performed at [facility], [address] [place], [place] [address]    Report Status 09/10/2022 FINAL  Final  CSF culture w Gram Stain     Status: None   Collection Time: 09/05/22  5:56 PM   Specimen: CSF; Cerebrospinal Fluid  Result Value Ref Range Status   Specimen Description CSF  Final   Special Requests LP  Final   Gram Stain   Final    WBC PRESENT, PREDOMINANTLY PMN GRAM POSITIVE COCCI IN PAIRS CYTOSPIN SMEAR CRITICAL RESULT CALLED TO, READ BACK BY AND VERIFIED WITH: RN [place] ALLEN ON 09/05/22 @ 1853 BY DRT Performed at [facility], [address], [place], [place] [address]    Culture RARE STREPTOCOCCUS PNEUMONIAE  Final   Report Status 09/08/2022 FINAL  Final   Organism ID, Bacteria STREPTOCOCCUS PNEUMONIAE  Final       Susceptibility   Streptococcus pneumoniae - MIC*    ERYTHROMYCIN >=8 RESISTANT Resistant     LEVOFLOXACIN 0.5 SENSITIVE Sensitive     VANCOMYCIN 0.25 SENSITIVE Sensitive     PENICILLIN (meningitis) 0.5 RESISTANT Resistant     PENO - penicillin 0.5      PENICILLIN (non-meningitis) 0.5 SENSITIVE Sensitive     PENICILLIN (oral) 0.5 INTERMEDIATE Intermediate     CEFTRIAXONE (non-meningitis) 0.25 SENSITIVE Sensitive     CEFTRIAXONE (meningitis) 0.25 SENSITIVE Sensitive     * RARE STREPTOCOCCUS PNEUMONIAE  Culture, fungus without smear     Status: None (Preliminary result)   Collection Time: 09/05/22  5:56 PM   Specimen: CSF; Cerebrospinal Fluid  Result Value Ref Range Status   Specimen Description CSF  Final   Special Requests LP  Final   Culture   Final    NO FUNGUS ISOLATED AFTER 8 DAYS Performed at [facility], [address], [place], [place] [address]    Report Status PENDING  Incomplete  MRSA Next Gen by PCR, Nasal     Status: None   Collection Time: 09/05/22  6:41 PM   Specimen: Nasal Mucosa; Nasal Swab  Result Value Ref Range Status   MRSA by PCR Next Gen NOT DETECTED NOT DETECTED Final    Comment: (NOTE) The GeneXpert MRSA Assay (FDA approved for NASAL specimens only), is one component of a comprehensive MRSA colonization surveillance program. It is not intended to diagnose MRSA infection nor to guide or monitor treatment for MRSA infections. Test performance is not FDA approved in patients less than 83 years old. Performed at [facility], [address] [place], [place] [address]   Culture, blood (Routine X 2)     Status: None   Collection Time: 09/05/22  6:53 PM   Specimen: BLOOD  Result Value Ref Range Status   Specimen Description BLOOD RIGHT ANTECUBITAL  Final   Special Requests   Final    BOTTLES DRAWN AEROBIC AND ANAEROBIC Blood Culture adequate volume   Culture   Final    NO GROWTH 5 DAYS Performed at [facility], [address]  [place], [place] [address]    Report Status 09/10/2022 FINAL  Final  Culture, blood (Routine X 2)     Status: None   Collection Time: 09/05/22  6:55 PM   Specimen: BLOOD  Result Value Ref Range Status  Specimen Description BLOOD SITE NOT SPECIFIED  Final   Special Requests   Final    BOTTLES DRAWN AEROBIC AND ANAEROBIC Blood Culture results may not be optimal due to an inadequate volume of blood received in culture bottles   Culture   Final    NO GROWTH 5 DAYS Performed at [facility], [address] [place], [place] [address]    Report Status 09/10/2022 FINAL  Final  Culture, blood (Routine X 2) w Reflex to ID Panel     Status: None   Collection Time: 09/06/22 11:40 PM   Specimen: BLOOD  Result Value Ref Range Status   Specimen Description BLOOD BLOOD RIGHT HAND  Final   Special Requests   Final    BOTTLES DRAWN AEROBIC AND ANAEROBIC Blood Culture adequate volume   Culture   Final    NO GROWTH 5 DAYS Performed at [facility], [address], [place], [place] [address]    Report Status 09/11/2022 FINAL  Final  Culture, blood (Routine X 2) w Reflex to ID Panel     Status: None   Collection Time: 09/06/22 11:40 PM   Specimen: BLOOD  Result Value Ref Range Status   Specimen Description BLOOD BLOOD LEFT HAND  Final   Special Requests   Final    BOTTLES DRAWN AEROBIC AND ANAEROBIC Blood Culture adequate volume   Culture   Final    NO GROWTH 5 DAYS Performed at [facility], [address] [place], [place] [address]    Report Status 09/11/2022 FINAL  Final    Impression/Plan:  1. Infective endocarditis - large vegetation noted with perforation on the MV.  On antibiotics with ceftriaxone for Pneumococcal infection.  Will continue  He will need follow up with TCTS after treatment completion or with any acute issue/changes related to his valve.  2.  Meningitis - continues on ceftriaxone as above, CNS dosing for now.    3.  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    4.  HIV - he continues on Biktarvy and had been off medications.  Tolerating well.  Will consider rechecking his viral load around the end of this month.

## 2022-09-15 NOTE — Assessment & Plan Note (Addendum)
Stable, mental status appears to be at baseline. Due to the vegetation seen on his TEE, we will require a prolonged course of abx for 6 weeks. Patient has no insurance and has a history of drug abuse so he is not a candidate for CIR or SNF or IV access at home. - Continue Rocephin, will require 6 weeks of abx therapy due to vegetation on TEE (Day 11; 7/1-8/12) - Monitor BMP weekly

## 2022-09-15 NOTE — Assessment & Plan Note (Signed)
At baseline according to mom, patient is very quiet and doesn't speak much. - assess mental status daily to monitor improvement - STAT head CT for any change in neuro exam  - Started Lexapro 10 mg to help his mood 

## 2022-09-15 NOTE — Plan of Care (Signed)
  Problem: Education: Goal: Ability to describe self-care measures that may prevent or decrease complications (Diabetes Survival Skills Education) will improve Outcome: Progressing Goal: Individualized Educational Video(s) Outcome: Progressing   Problem: Coping: Goal: Ability to adjust to condition or change in health will improve Outcome: Progressing   Problem: Fluid Volume: Goal: Ability to maintain a balanced intake and output will improve Outcome: Progressing   Problem: Health Behavior/Discharge Planning: Goal: Ability to identify and utilize available resources and services will improve Outcome: Progressing Goal: Ability to manage health-related needs will improve Outcome: Progressing   Problem: Metabolic: Goal: Ability to maintain appropriate glucose levels will improve Outcome: Progressing   Problem: Nutritional: Goal: Maintenance of adequate nutrition will improve Outcome: Progressing Goal: Progress toward achieving an optimal weight will improve Outcome: Progressing   Problem: Skin Integrity: Goal: Risk for impaired skin integrity will decrease Outcome: Progressing   Problem: Tissue Perfusion: Goal: Adequacy of tissue perfusion will improve Outcome: Progressing   Problem: Education: Goal: Knowledge of General Education information will improve Description: Including pain rating scale, medication(s)/side effects and non-pharmacologic comfort measures Outcome: Progressing   Problem: Health Behavior/Discharge Planning: Goal: Ability to manage health-related needs will improve Outcome: Progressing   Problem: Clinical Measurements: Goal: Ability to maintain clinical measurements within normal limits will improve Outcome: Progressing Goal: Will remain free from infection Outcome: Progressing Goal: Diagnostic test results will improve Outcome: Progressing Goal: Respiratory complications will improve Outcome: Progressing Goal: Cardiovascular complication will  be avoided Outcome: Progressing   Problem: Activity: Goal: Risk for activity intolerance will decrease Outcome: Progressing   Problem: Nutrition: Goal: Adequate nutrition will be maintained Outcome: Progressing   Problem: Coping: Goal: Level of anxiety will decrease Outcome: Progressing   Problem: Elimination: Goal: Will not experience complications related to bowel motility Outcome: Progressing Goal: Will not experience complications related to urinary retention Outcome: Progressing   Problem: Pain Managment: Goal: General experience of comfort will improve Outcome: Progressing   Problem: Safety: Goal: Ability to remain free from injury will improve Outcome: Progressing   Problem: Skin Integrity: Goal: Risk for impaired skin integrity will decrease Outcome: Progressing   Problem: Education: Goal: Knowledge of disease or condition will improve Outcome: Progressing Goal: Knowledge of secondary prevention will improve (MUST DOCUMENT ALL) Outcome: Progressing Goal: Knowledge of patient specific risk factors will improve (Mark N/A or DELETE if not current risk factor) Outcome: Progressing   Problem: Ischemic Stroke/TIA Tissue Perfusion: Goal: Complications of ischemic stroke/TIA will be minimized Outcome: Progressing   Problem: Coping: Goal: Will verbalize positive feelings about self Outcome: Progressing Goal: Will identify appropriate support needs Outcome: Progressing   Problem: Health Behavior/Discharge Planning: Goal: Ability to manage health-related needs will improve Outcome: Progressing Goal: Goals will be collaboratively established with patient/family Outcome: Progressing   Problem: Self-Care: Goal: Ability to participate in self-care as condition permits will improve Outcome: Progressing Goal: Verbalization of feelings and concerns over difficulty with self-care will improve Outcome: Progressing Goal: Ability to communicate needs accurately will  improve Outcome: Progressing   Problem: Nutrition: Goal: Risk of aspiration will decrease Outcome: Progressing Goal: Dietary intake will improve Outcome: Progressing   

## 2022-09-15 NOTE — Assessment & Plan Note (Signed)
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 LVEF 60-65%. TEE showed a large vegetation of the anterior mitral leaflet, mostly fixed, but has mobile components. There is an associated perforation of the anterior leaflet with severe eccentric mitral insufficiency, with a highly eccentric posteriorly-directed jet.  - Continue Rocephin

## 2022-09-15 NOTE — Progress Notes (Signed)
Occupational Therapy Treatment Patient Details Name: Christopher Farrell MRN: [id number] DOB: May 25, 1960 Today's Date: 09/15/2022   History of present illness Pt is a 62 y/o male presenting on 7/1 with cough and AMS. Noted presented to ED on 6/26 and found with multifocal pneumonia. Returned to ED after being found unresponsive, admitted with sepsis. 2 seizures in ED. CSF reveals bacterial meningitis. MRI with "multifocal acute/subacute ischemia within the right greater than left hemispheres and left cerebellum. Largest lesions in R parietal and occipital lobes". Intubated 7/1-7/3. PMH: HIV.   OT comments  Patient received in supine and required encouragement to participate. Patient able to get to EOB without assistance and asked to attempt mobility without RW. Patient performed mobility and transfers with min assist with HHA. Patient demonstrating gains with standing at sink for self care with patient able to perform peri care while standing at sink with min guard assist. Patient able to follow directions with increased time 75% of time. Acute OT to continue to follow.    Recommendations for follow up therapy are one component of a multi-disciplinary discharge planning process, led by the attending physician.  Recommendations may be updated based on patient status, additional functional criteria and insurance authorization.    Assistance Recommended at Discharge Frequent or constant Supervision/Assistance  Patient can return home with the following  Assistance with cooking/housework;Direct supervision/assist for medications management;Direct supervision/assist for financial management;Help with stairs or ramp for entrance;Assist for transportation;A little help with walking and/or transfers;A little help with bathing/dressing/bathroom   Equipment Recommendations  None recommended by OT    Recommendations for Other Services      Precautions / Restrictions Precautions Precautions:  Fall Restrictions Weight Bearing Restrictions: No (Simultaneous filing. User may not have seen previous data.)       Mobility Bed Mobility Overal bed mobility: Modified Independent             General bed mobility comments: able to get to EOB without assistance and increased time    Transfers Overall transfer level: Needs assistance Equipment used: 1 person hand held assist Transfers: Sit to/from Stand, Bed to chair/wheelchair/BSC Sit to Stand: Min guard     Step pivot transfers: Min assist     General transfer comment: min guard to min assist for safety with HHA     Balance Overall balance assessment: Needs assistance Sitting-balance support: Feet supported Sitting balance-Leahy Scale: Good     Standing balance support: Single extremity supported, During functional activity, Reliant on assistive device for balance Standing balance-Leahy Scale: Poor Standing balance comment: one extremity support while standing at sink and mobility                           ADL either performed or assessed with clinical judgement   ADL Overall ADL's : Needs assistance/impaired     Grooming: Wash/dry hands;Wash/dry face;Oral care;Min guard;Standing   Upper Body Bathing: Min guard;Standing   Lower Body Bathing: Min guard;Sit to/from stand Lower Body Bathing Details (indicate cue type and reason): bathed peri area while standing at sink Upper Body Dressing : Minimal assistance;Sitting Upper Body Dressing Details (indicate cue type and reason): changed gown Lower Body Dressing: Moderate assistance                      Extremity/Trunk Assessment              Vision       Perception  Praxis      Cognition Arousal/Alertness: Awake/alert Behavior During Therapy: Flat affect Overall Cognitive Status: Impaired/Different from baseline Area of Impairment: Memory                   Current Attention Level: Sustained   Following Commands:  Follows one step commands with increased time Safety/Judgement: Decreased awareness of safety, Decreased awareness of deficits   Problem Solving: Slow processing General Comments: required encouragement to participate, limited verbalization        Exercises      Shoulder Instructions       General Comments      Pertinent Vitals/ Pain       Pain Assessment Pain Assessment: Faces Faces Pain Scale: No hurt Pain Intervention(s): Monitored during session  Home Living                                          Prior Functioning/Environment              Frequency  Min 2X/week        Progress Toward Goals  OT Goals(current goals can now be found in the care plan section)  Progress towards OT goals: Progressing toward goals  Acute Rehab OT Goals OT Goal Formulation: Patient unable to participate in goal setting Time For Goal Achievement: 09/22/22 Potential to Achieve Goals: Fair ADL Goals Pt Will Perform Grooming: sitting;with min assist Pt Will Perform Upper Body Dressing: with supervision;sitting Pt Will Transfer to Toilet: with min assist;ambulating Additional ADL Goal #1: Patient will complete bed mobility with min assist and maintain sitting balance at EOB with min guard for 5 minutes as precursor to ADLs. (met) Additional ADL Goal #2: Pt will follow 1 step commands with 90% accuracy given increased time.  Plan Discharge plan needs to be updated    Co-evaluation                 AM-PAC OT "6 Clicks" Daily Activity     Outcome Measure   Help from another person eating meals?: A Little Help from another person taking care of personal grooming?: A Little Help from another person toileting, which includes using toliet, bedpan, or urinal?: A Lot Help from another person bathing (including washing, rinsing, drying)?: A Lot Help from another person to put on and taking off regular upper body clothing?: A Lot Help from another person to put  on and taking off regular lower body clothing?: A Lot 6 Click Score: 14    End of Session Equipment Utilized During Treatment: Gait belt  OT Visit Diagnosis: Other abnormalities of gait and mobility (R26.89);Muscle weakness (generalized) (M62.81);Pain;Other symptoms and signs involving cognitive function;Cognitive communication deficit (R41.841);Other symptoms and signs involving the nervous system (R29.898)   Activity Tolerance Patient tolerated treatment well   Patient Left in chair;with call bell/phone within reach;with chair alarm set   Nurse Communication Mobility status        Time: 1610-9604 OT Time Calculation (min): 31 min  Charges: OT General Charges $OT Visit: 1 Visit OT Treatments $Self Care/Home Management : 23-37 mins  Alfonse Flavors, OTA Acute Rehabilitation Services  Office [phone]   Dewain Penning 09/15/2022, 9:42 AM

## 2022-09-16 NOTE — Progress Notes (Signed)
Daily Progress Note Intern Pager: [phone]  Patient name: Christopher Farrell Medical record number: [id number] Date of birth: Mar 07, 1961 Age: 62 y.o. Gender: male  Primary Care Provider: Pcp, No Consultants: Cardiology, CCM, ID, Neurology Code Status: Full Code  Pt Overview and Major Events to Date:  7/1: Admission and Intubation 7/2: Brain MRI 7/4: Extubated 7/5: Transferred to FMTS  7/8: TEE 7/9: Prolonged IV Rocephin for 6 weeks total 7/10: Started Lexapro 10 mg  Assessment and Plan: Christopher Farrell is a 62 y.o. 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 Pertinent PMH/PSH includes HIV not on antiretrovirals.   [facility]     * (Principal) Meningitis due to Streptococcus pneumoniae     Stable, mental status appears to be at baseline. Due to the vegetation  seen on his TEE, we will require a prolonged course of abx for 6 weeks.  Patient has no insurance and has a history of drug abuse so he is not a  candidate for CIR or SNF or IV access at home. - Continue Rocephin, will require 6 weeks of abx therapy due to vegetation  on TEE (Day 12; 7/1-8/12) - Monitor BMP weekly        Altered mental status     At baseline according to mom, patient is very quiet and doesn't speak  much. 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  LVEF 60-65%. TEE showed a large vegetation of the anterior mitral leaflet,  mostly fixed, but has mobile components. There is an associated  perforation of the anterior leaflet with severe eccentric mitral  insufficiency, with a  highly eccentric posteriorly-directed jet.  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 - continue Rocephin      FEN/GI: Regular Diet PPx: Lovenox Dispo:pending IV abx therapy  Subjective:  Patient did well overnight and has no complaints this morning. He feels the same as yesterday and complains of no issues.  Objective: Temp:  [98.1 F (36.7 C)-98.7 F (37.1 C)] 98.3 F (36.8 C) (07/12 0728) Pulse Rate:  [78-88] 79 (07/12 0728) Resp:  [16-47] 18 (07/12 0728) BP: (89-94)/(62-73) 89/62 (07/12 0728) SpO2:  [99 %-100 %] 99 % (07/12 0728)  Physical Exam: General: NAD, awake and alert. Cardiovascular: RRR. Mitral regurg murmur. Respiratory: CTAB, normal WOB on RA Abdomen: soft, nontender, nondistended Extremities: no BLE edema Neuro: A&Ox3. No focal neurological deficits.  Laboratory: Most recent CBC Lab Results  Component Value Date   WBC 8.0 09/12/2022   HGB 9.8 (L) 09/12/2022   HCT 29.0 (L) 09/12/2022   MCV 85.5 09/12/2022   PLT 322 09/12/2022   Most recent BMP    Latest Ref Rng & Units 09/13/2022   11:49 PM  BMP  Glucose 70 - 99 mg/dL 962   BUN 8 - 23 mg/dL 9   Creatinine 9.52 - 8.41 mg/dL 3.24   Sodium 401 - 027 mmol/L 132  Potassium 3.5 - 5.1 mmol/L 3.9   Chloride 98 - 111 mmol/L 100   CO2 22 - 32 mmol/L 25   Calcium 8.9 - 10.3 mg/dL 7.8    Imaging/Diagnostic Tests: No new imaging.  Fortunato Curling, DO 09/16/2022, 8:27 AM  PGY-1, [facility] FPTS Intern pager: [phone], text pages welcome Secure chat group [facility] Teaching Service

## 2022-09-16 NOTE — Progress Notes (Signed)
Occupational Therapy Treatment Patient Details Name: Christopher Farrell MRN: [id number] DOB: [birth year] Today's Date: 09/16/2022   History of present illness Pt is a 62 y/o male presenting on 7/1 with cough and AMS. Noted presented to ED on 6/26 and found with multifocal pneumonia. Returned to ED after being found unresponsive, admitted with sepsis. 2 seizures in ED. CSF reveals bacterial meningitis. MRI with "multifocal acute/subacute ischemia within the right greater than left hemispheres and left cerebellum. Largest lesions in R parietal and occipital lobes". Intubated 7/1-7/3. PMH: HIV.   OT comments  Patient required encouragement to participate due to patient stating he had recently completed PT. Patient willing to transfer to recliner and address BUE HEP. Patient able to perform exercises with red therapy band and min cues. Acute OT to continue to follow.    Recommendations for follow up therapy are one component of a multi-disciplinary discharge planning process, led by the attending physician.  Recommendations may be updated based on patient status, additional functional criteria and insurance authorization.    Assistance Recommended at Discharge Frequent or constant Supervision/Assistance  Patient can return home with the following  Assistance with cooking/housework;Direct supervision/assist for medications management;Direct supervision/assist for financial management;Help with stairs or ramp for entrance;Assist for transportation;A little help with walking and/or transfers;A little help with bathing/dressing/bathroom   Equipment Recommendations  None recommended by OT    Recommendations for Other Services      Precautions / Restrictions Precautions Precautions: Fall Restrictions Weight Bearing Restrictions: No       Mobility Bed Mobility Overal bed mobility: Modified Independent Bed Mobility: Supine to Sit     Supine to sit: Modified independent (Device/Increase time)      General bed mobility comments: no assistance needed to get to EOB    Transfers Overall transfer level: Needs assistance Equipment used: None Transfers: Sit to/from Stand, Bed to chair/wheelchair/BSC Sit to Stand: Supervision           General transfer comment: able to transfer from bed to recliner with supervision     Balance Overall balance assessment: Needs assistance Sitting-balance support: Feet supported, No upper extremity supported Sitting balance-Leahy Scale: Good     Standing balance support: During functional activity, No upper extremity supported Standing balance-Leahy Scale: Fair                             ADL either performed or assessed with clinical judgement   ADL Overall ADL's : Needs assistance/impaired                                       General ADL Comments: declined self care tasks    Extremity/Trunk Assessment              Vision       Perception     Praxis      Cognition Arousal/Alertness: Awake/alert Behavior During Therapy: Flat affect, Agitated Overall Cognitive Status: Impaired/Different from baseline Area of Impairment: Memory, Following commands, Safety/judgement, Problem solving                     Memory: Decreased recall of precautions, Decreased short-term memory Following Commands: Follows one step commands with increased time, Follows one step commands inconsistently Safety/Judgement: Decreased awareness of safety, Decreased awareness of deficits   Problem Solving: Slow processing, Requires verbal cues General Comments: gave wrong  day of week but was able to give date and place, required encouragement to participate        Exercises Exercises: General Upper Extremity General Exercises - Upper Extremity Shoulder Flexion: Strengthening, Both, 15 reps Shoulder Horizontal ABduction: Strengthening, Both, 15 reps    Shoulder Instructions       General Comments       Pertinent Vitals/ Pain       Pain Assessment Pain Assessment: No/denies pain Pain Intervention(s): Monitored during session  Home Living                                          Prior Functioning/Environment              Frequency  Min 2X/week        Progress Toward Goals  OT Goals(current goals can now be found in the care plan section)  Progress towards OT goals: Progressing toward goals  Acute Rehab OT Goals Patient Stated Goal: go home OT Goal Formulation: With patient Time For Goal Achievement: 09/22/22 Potential to Achieve Goals: Good ADL Goals Pt Will Perform Grooming: sitting;with min assist Pt Will Perform Upper Body Dressing: with supervision;sitting Pt Will Transfer to Toilet: with min assist;ambulating Additional ADL Goal #1: Patient will complete bed mobility with min assist and maintain sitting balance at EOB with min guard for 5 minutes as precursor to ADLs. Additional ADL Goal #2: Pt will follow 1 step commands with 90% accuracy given increased time.  Plan Discharge plan needs to be updated    Co-evaluation                 AM-PAC OT "6 Clicks" Daily Activity     Outcome Measure   Help from another person eating meals?: A Little Help from another person taking care of personal grooming?: A Little Help from another person toileting, which includes using toliet, bedpan, or urinal?: A Little Help from another person bathing (including washing, rinsing, drying)?: A Little Help from another person to put on and taking off regular upper body clothing?: A Little Help from another person to put on and taking off regular lower body clothing?: A Little 6 Click Score: 18    End of Session    OT Visit Diagnosis: Other abnormalities of gait and mobility (R26.89);Muscle weakness (generalized) (M62.81);Pain;Other symptoms and signs involving cognitive function;Cognitive communication deficit (R41.841);Other symptoms and signs  involving the nervous system (R29.898)   Activity Tolerance Patient tolerated treatment well   Patient Left in chair;with call bell/phone within reach;with chair alarm set   Nurse Communication Mobility status        Time: 1914-7829 OT Time Calculation (min): 15 min  Charges: OT General Charges $OT Visit: 1 Visit OT Treatments $Therapeutic Activity: 8-22 mins  Christopher Farrell, OTA Acute Rehabilitation Services  Office [phone]   Christopher Farrell 09/16/2022, 12:52 PM

## 2022-09-16 NOTE — Assessment & Plan Note (Signed)
At baseline according to mom, patient is very quiet and doesn't speak much. - assess mental status daily to monitor improvement - STAT head CT for any change in neuro exam  - Started Lexapro 10 mg to help his mood 

## 2022-09-16 NOTE — Assessment & Plan Note (Signed)
Continue Biktarvy. 

## 2022-09-16 NOTE — Progress Notes (Signed)
Physical Therapy Treatment Patient Details Name: Christopher Farrell MRN: [id number] DOB: 06/16/1960 Today's Date: 09/16/2022   History of Present Illness Pt is a 62 y/o male presenting on 7/1 with cough and AMS. Noted presented to ED on 6/26 and found with multifocal pneumonia. Returned to ED after being found unresponsive, admitted with sepsis. 2 seizures in ED. CSF reveals bacterial meningitis. MRI with "multifocal acute/subacute ischemia within the right greater than left hemispheres and left cerebellum. Largest lesions in R parietal and occipital lobes". Intubated 7/1-7/3. PMH: HIV.    PT Comments  Continues to make progress towards functional goals. Balance, and confidence improving. Denies SPC use but willing to participate in gait training without AD today. Demonstrates minor instability requiring min guard for safety but no overt LOB noted today. Facilitated cognitive challenges with orientation to unit and having to recall locations. Pt still with significant difficulty recalling where his room is located after walking down the hall. Minor drifting with wide BOS while searching for room numbers. Patient will continue to benefit from skilled physical therapy services to further improve independence with functional mobility.      Assistance Recommended at Discharge Frequent or constant Supervision/Assistance  If plan is discharge home, recommend the following:  Can travel by private vehicle    Direct supervision/assist for medications management;Direct supervision/assist for financial management;Assist for transportation;A little help with walking and/or transfers;A little help with bathing/dressing/bathroom      Equipment Recommendations  BSC/3in1;Rolling walker (2 wheels) (pending progress)    Recommendations for Other Services       Precautions / Restrictions Precautions Precautions: Fall Restrictions Weight Bearing Restrictions: No     Mobility  Bed Mobility Overal bed  mobility: Modified Independent             General bed mobility comments: able to get to EOB without assistance and increased time    Transfers Overall transfer level: Needs assistance Equipment used: None Transfers: Sit to/from Stand, Bed to chair/wheelchair/BSC Sit to Stand: Supervision           General transfer comment: Supervision for safety, able to stabilize himself. Declines trying [facility]    Ambulation/Gait Ambulation/Gait assistance: Min guard Gait Distance (Feet): 300 Feet Assistive device: None Gait Pattern/deviations: Step-through pattern, Decreased stride length, Drifts right/left Gait velocity: decreased Gait velocity interpretation: <1.8 ft/sec, indicate of risk for recurrent falls   General Gait Details: Declines SPC use. Agreeable to challenge himself without AD. Pt requires min guard for safety. Frequent VC for direction, orientation on unit, and recall to find his room. Cues for safety, awareness, and target finding while ambulating. Drifts when turning head Lt and Rt while ambulating. no overt LOB during this bout.   Stairs             Wheelchair Mobility     Tilt Bed    Modified Rankin (Stroke Patients Only) Modified Rankin (Stroke Patients Only) Pre-Morbid Rankin Score: No symptoms Modified Rankin: Moderate disability     Balance Overall balance assessment: Needs assistance Sitting-balance support: Feet supported, No upper extremity supported Sitting balance-Leahy Scale: Good     Standing balance support: During functional activity, No upper extremity supported Standing balance-Leahy Scale: Fair                              Cognition Arousal/Alertness: Awake/alert Behavior During Therapy: Flat affect, Agitated Overall Cognitive Status: Impaired/Different from baseline Area of Impairment: Memory, Following commands, Safety/judgement, Problem solving  Memory: Decreased recall of precautions,  Decreased short-term memory Following Commands: Follows one step commands with increased time, Follows one step commands inconsistently Safety/Judgement: Decreased awareness of safety, Decreased awareness of deficits   Problem Solving: Slow processing, Requires verbal cues General Comments: required encouragement to participate, limited verbalization        Exercises      General Comments        Pertinent Vitals/Pain Pain Assessment Pain Assessment: No/denies pain Pain Intervention(s): Monitored during session    Home Living                          Prior Function            PT Goals (current goals can now be found in the care plan section) Acute Rehab PT Goals Patient Stated Goal: did not state PT Goal Formulation: With patient/family Time For Goal Achievement: 09/22/22 Potential to Achieve Goals: Good Progress towards PT goals: Progressing toward goals    Frequency    Min 3X/week      PT Plan Current plan remains appropriate    Co-evaluation              AM-PAC PT "6 Clicks" Mobility   Outcome Measure  Help needed turning from your back to your side while in a flat bed without using bedrails?: None Help needed moving from lying on your back to sitting on the side of a flat bed without using bedrails?: None Help needed moving to and from a bed to a chair (including a wheelchair)?: A Little Help needed standing up from a chair using your arms (e.g., wheelchair or bedside chair)?: A Little Help needed to walk in hospital room?: A Little Help needed climbing 3-5 steps with a railing? : A Little 6 Click Score: 20    End of Session Equipment Utilized During Treatment: Gait belt Activity Tolerance: Patient tolerated treatment well Patient left: with call bell/phone within reach;in bed;with bed alarm set (Declines to sit in chair.) Nurse Communication: Mobility status PT Visit Diagnosis: Unsteadiness on feet (R26.81);Other abnormalities of  gait and mobility (R26.89);Muscle weakness (generalized) (M62.81);Difficulty in walking, not elsewhere classified (R26.2)     Time: 1610-9604 PT Time Calculation (min) (ACUTE ONLY): 14 min  Charges:    $Gait Training: 8-22 mins PT General Charges $$ ACUTE PT VISIT: 1 Visit                     Kathlyn Sacramento, PT, DPT [facility]  Rehabilitation Services Physical Therapist Office: [phone] Website: [email or web address]    Berton Mount 09/16/2022, 12:39 PM

## 2022-09-16 NOTE — Assessment & Plan Note (Signed)
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 LVEF 60-65%. TEE showed a large vegetation of the anterior mitral leaflet, mostly fixed, but has mobile components. There is an associated perforation of the anterior leaflet with severe eccentric mitral insufficiency, with a highly eccentric posteriorly-directed jet.  - Continue Rocephin 

## 2022-09-16 NOTE — Assessment & Plan Note (Signed)
Stable, mental status appears to be at baseline. Due to the vegetation seen on his TEE, we will require a prolonged course of abx for 6 weeks. Patient has no insurance and has a history of drug abuse so he is not a candidate for CIR or SNF or IV access at home. - Continue Rocephin, will require 6 weeks of abx therapy due to vegetation on TEE (Day 12; 7/1-8/12) - Monitor BMP weekly

## 2022-09-16 NOTE — Plan of Care (Signed)
  Problem: Education: Goal: Ability to describe self-care measures that may prevent or decrease complications (Diabetes Survival Skills Education) will improve Outcome: Progressing Goal: Individualized Educational Video(s) Outcome: Progressing   Problem: Nutritional: Goal: Maintenance of adequate nutrition will improve Outcome: Progressing Goal: Progress toward achieving an optimal weight will improve Outcome: Progressing   Problem: Skin Integrity: Goal: Risk for impaired skin integrity will decrease Outcome: Progressing   Problem: Education: Goal: Knowledge of General Education information will improve Description: Including pain rating scale, medication(s)/side effects and non-pharmacologic comfort measures Outcome: Progressing

## 2022-09-16 NOTE — Plan of Care (Signed)
  Problem: Education: Goal: Ability to describe self-care measures that may prevent or decrease complications (Diabetes Survival Skills Education) will improve Outcome: Progressing Goal: Individualized Educational Video(s) Outcome: Progressing   Problem: Coping: Goal: Ability to adjust to condition or change in health will improve Outcome: Progressing   Problem: Fluid Volume: Goal: Ability to maintain a balanced intake and output will improve Outcome: Progressing   Problem: Health Behavior/Discharge Planning: Goal: Ability to identify and utilize available resources and services will improve Outcome: Progressing Goal: Ability to manage health-related needs will improve Outcome: Progressing   Problem: Metabolic: Goal: Ability to maintain appropriate glucose levels will improve Outcome: Progressing   Problem: Nutritional: Goal: Maintenance of adequate nutrition will improve Outcome: Progressing Goal: Progress toward achieving an optimal weight will improve Outcome: Progressing   Problem: Skin Integrity: Goal: Risk for impaired skin integrity will decrease Outcome: Progressing   Problem: Tissue Perfusion: Goal: Adequacy of tissue perfusion will improve Outcome: Progressing   Problem: Education: Goal: Knowledge of General Education information will improve Description: Including pain rating scale, medication(s)/side effects and non-pharmacologic comfort measures Outcome: Progressing   Problem: Health Behavior/Discharge Planning: Goal: Ability to manage health-related needs will improve Outcome: Progressing   Problem: Clinical Measurements: Goal: Ability to maintain clinical measurements within normal limits will improve Outcome: Progressing Goal: Will remain free from infection Outcome: Progressing Goal: Diagnostic test results will improve Outcome: Progressing Goal: Respiratory complications will improve Outcome: Progressing Goal: Cardiovascular complication will  be avoided Outcome: Progressing   Problem: Activity: Goal: Risk for activity intolerance will decrease Outcome: Progressing   Problem: Nutrition: Goal: Adequate nutrition will be maintained Outcome: Progressing   Problem: Coping: Goal: Level of anxiety will decrease Outcome: Progressing   Problem: Elimination: Goal: Will not experience complications related to bowel motility Outcome: Progressing Goal: Will not experience complications related to urinary retention Outcome: Progressing   Problem: Pain Managment: Goal: General experience of comfort will improve Outcome: Progressing   Problem: Safety: Goal: Ability to remain free from injury will improve Outcome: Progressing   Problem: Skin Integrity: Goal: Risk for impaired skin integrity will decrease Outcome: Progressing   Problem: Education: Goal: Knowledge of disease or condition will improve Outcome: Progressing Goal: Knowledge of secondary prevention will improve (MUST DOCUMENT ALL) Outcome: Progressing Goal: Knowledge of patient specific risk factors will improve (Mark N/A or DELETE if not current risk factor) Outcome: Progressing   Problem: Ischemic Stroke/TIA Tissue Perfusion: Goal: Complications of ischemic stroke/TIA will be minimized Outcome: Progressing   Problem: Coping: Goal: Will verbalize positive feelings about self Outcome: Progressing Goal: Will identify appropriate support needs Outcome: Progressing   Problem: Health Behavior/Discharge Planning: Goal: Ability to manage health-related needs will improve Outcome: Progressing Goal: Goals will be collaboratively established with patient/family Outcome: Progressing   Problem: Self-Care: Goal: Ability to participate in self-care as condition permits will improve Outcome: Progressing Goal: Verbalization of feelings and concerns over difficulty with self-care will improve Outcome: Progressing Goal: Ability to communicate needs accurately will  improve Outcome: Progressing   Problem: Nutrition: Goal: Risk of aspiration will decrease Outcome: Progressing Goal: Dietary intake will improve Outcome: Progressing   

## 2022-09-16 NOTE — TOC Progression Note (Signed)
Transition of Care [facility]) - Progression Note    Patient Details  Name: Christopher Farrell MRN: [id number] Date of Birth: 1961-02-16  Transition of Care [facility]) CM/SW Contact  Kermit Balo, RN Phone Number: 09/16/2022, 11:37 AM  Clinical Narrative:     Pt is here for 6 weeks of IV abx.  TOC following.  Expected Discharge Plan: Home/Self Care    Expected Discharge Plan and Services                                               Social Determinants of Health (SDOH) Interventions SDOH Screenings   Food Insecurity: No Food Insecurity (09/13/2022)  Housing: Patient Declined (09/13/2022)  Transportation Needs: No Transportation Needs (09/13/2022)  Utilities: Not At Risk (09/13/2022)  Depression (PHQ2-9): High Risk (09/02/2022)  Tobacco Use: High Risk (09/13/2022)    Readmission Risk Interventions     No data to display

## 2022-09-16 NOTE — Assessment & Plan Note (Signed)
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 - continue Rocephin 

## 2022-09-17 NOTE — Progress Notes (Signed)
Daily Progress Note Intern Pager: [phone]  Patient name: Christopher Farrell Medical record number: [id number] Date of birth: 1960/09/02 Age: 62 y.o. Gender: male  Primary Care Provider: Pcp, No Consultants: Cardiology, CCM, ID, Neurology Code Status: FULL CODE  Pt Overview and Major Events to Date:  7/1: Admission and Intubation 7/2: Brain MRI 7/4: Extubated 7/5: Transferred to FMTS  7/8: TEE 7/9: Prolonged IV Rocephin for 6 weeks total 7/10: Started Lexapro 10 mg  Assessment and Plan: Christopher Farrell is a 62 y.o. male presenting with meningitis due to Streptococcus pneumoniae and large vegetation on the anterior mitral leaflet with associated perforation of the anterior leaflet with severe mitral insufficiency, which will require 6 weeks of inpatient IV administration of antibiotics. Pertinent PMH/PSH includes HIV not on antiretrovirals. Patient remains clinically stable.  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 - Continue Rocephin for 6 weeks of antibiotic therapy due to vegetation  (day 13; 7/1-8/12), and will remain in hospital due to inability for  patient to administer antibiotics in the outpatient setting (uninsured,  history of substance use)        Altered mental status     At baseline.  Seemed more responsive today. - assess mental status daily to monitor improvement - STAT head CT for any change in neuro exam         HIV infection (HCC)     - Continue Biktarvy        Meningitis due to Streptococcus pneumoniae     Stable, mental status appears to be at baseline.  Initially required  ICU level of care. 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 - continue  Rocephin       FEN/GI: Regular PPx: SCDs, out of bed Dispo: Pending completion of IV antibiotic therapy  Subjective:  Pleasant this morning.  Resting comfortably.  Has no complaints.  Objective: Temp:  [97.8 F (36.6 C)-98.7 F (37.1 C)] 98.7 F (37.1 C) (07/13 0453) Pulse Rate:  [77-88] 77 (07/13 0453) Resp:  [16-18] 18 (07/13 0453) BP: (86-103)/(62-73) 94/70 (07/13 0453) SpO2:  [99 %-100 %] 100 % (07/13 0453) Physical Exam: General: Chronically ill-appearing male laying in the bed no distress Cardiovascular: Regular rate and rhythm Respiratory: Normal work of breathing on room air, clear to auscultation anteriorly and posteriorly Abdomen: Soft, thin, nontender nondistended Extremities: No edema  Laboratory: Most recent CBC Lab Results  Component Value Date   WBC 8.0 09/12/2022   HGB 9.8 (L) 09/12/2022   HCT 29.0 (L) 09/12/2022   MCV 85.5 09/12/2022   PLT 322 09/12/2022   Most recent BMP    Latest Ref Rng & Units 09/13/2022   11:49 PM  BMP  Glucose 70 - 99 mg/dL 366   BUN 8 - 23 mg/dL 9   Creatinine 4.40 - 3.47 mg/dL 4.25   Sodium 956 - 387 mmol/L 132   Potassium 3.5 - 5.1 mmol/L 3.9   Chloride 98 - 111 mmol/L 100   CO2 22 - 32 mmol/L 25   Calcium 8.9 - 10.3 mg/dL 7.8      Litzi Binning,  Nolberto Hanlon, DO 09/17/2022, 6:22 AM  PGY-3, [facility] FPTS Intern pager: [phone], text pages welcome Secure chat group [facility] Teaching Service

## 2022-09-17 NOTE — Assessment & Plan Note (Signed)
Continue Biktarvy. 

## 2022-09-17 NOTE — Assessment & Plan Note (Signed)
At baseline.  Seemed more responsive today. - assess mental status daily to monitor improvement - STAT head CT for any change in neuro exam

## 2022-09-17 NOTE — Assessment & Plan Note (Signed)
Stable, mental status appears to be at baseline.  Initially required ICU level of care. - Continue Rocephin, will require 6 weeks of abx therapy due to vegetation on TEE (Day 12; 7/1-8/12) - Monitor BMP weekly

## 2022-09-17 NOTE — Plan of Care (Signed)
  Problem: Education: Goal: Ability to describe self-care measures that may prevent or decrease complications (Diabetes Survival Skills Education) will improve Outcome: Progressing Goal: Individualized Educational Video(s) Outcome: Progressing   Problem: Coping: Goal: Ability to adjust to condition or change in health will improve Outcome: Progressing   Problem: Fluid Volume: Goal: Ability to maintain a balanced intake and output will improve Outcome: Progressing   Problem: Health Behavior/Discharge Planning: Goal: Ability to identify and utilize available resources and services will improve Outcome: Progressing Goal: Ability to manage health-related needs will improve Outcome: Progressing   Problem: Nutritional: Goal: Maintenance of adequate nutrition will improve Outcome: Progressing Goal: Progress toward achieving an optimal weight will improve Outcome: Progressing   

## 2022-09-17 NOTE — Assessment & Plan Note (Addendum)
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 - Continue Rocephin for 6 weeks of antibiotic therapy due to vegetation (day 13; 7/1-8/12), and will remain in hospital due to inability for patient to administer antibiotics in the outpatient setting (uninsured, history of substance use)

## 2022-09-18 ENCOUNTER — Encounter (HOSPITAL_COMMUNITY): Payer: Self-pay | Admitting: Pulmonary Disease

## 2022-09-18 NOTE — Assessment & Plan Note (Signed)
Secondary to infective endocarditis  - continue Rocephin for 6 week course as above

## 2022-09-18 NOTE — Assessment & Plan Note (Addendum)
Resolved

## 2022-09-18 NOTE — Assessment & Plan Note (Addendum)
Mental status at baseline. No neurologic deficits compared to baseline.  - Continue 6 week Rocephin treatment for endocarditis   - STAT head CT for any change in neuro exam  - BMP weekly

## 2022-09-18 NOTE — Assessment & Plan Note (Signed)
Continue biktarvy 

## 2022-09-18 NOTE — Progress Notes (Signed)
     Daily Progress Note Intern Pager: [phone]  Patient name: Christopher Farrell Medical record number: [id number] Date of birth: 1961-02-07 Age: 62 y.o. Gender: male  Primary Care Provider: Pcp, No Consultants: Cardiology, CCM, ID, Neurology  Code Status: Full Code   Pt Overview and Major Events to Date:  7/1: Admission and Intubation  7/2: Brain MRI 7/4: Extubated 7/5: Transferred to FMTS  7/8: TEE 7/9: Prolonged IV Rocephin for 6 weeks total 7/10: Started Lexapro 10 mg  Assessment and Plan: Christopher Farrell is a 62 y.o. male presenting with meningitis due to Streptococcus pneumoniae and large vegetation on the anterior mitral leaflet with associated perforation of the anterior leaflet with severe mitral insufficiency, requiring 6 weeks of inpatient IV administration of antibiotics. Pertinent PMH/PSH includes HIV not on antiretrovirals. Patient remains clinically stable.  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 No neurologic deficits compared to baseline.   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 Not a candidate for outpatient IV therapy   Subjective:  Patient has no concerns. Denies confusion or pain.    Objective: Temp:  [98 F (36.7 C)-98.8 F (37.1 C)] 98.3 F (36.8 C) (07/14 0059) Pulse Rate:  [77-89] 85 (07/14 0059) Resp:  [14-18] 18 (07/14 0059) BP: (87-94)/(56-73) 94/73 (07/14 0059) SpO2:  [99 %-100 %] 99 % (07/14 0059) Physical Exam: General: In no acute distress, lying in bed  Cardiovascular: RRR, cap refill < 2 seconds Respiratory: Normal work of breathing on room air Abdomen: Soft, non distended Extremities: No BLE edema  Neuro: Alert and oriented x 4   Laboratory: No labs today   Lockie Mola, MD 09/18/2022, 1:43 AM  PGY-2, [place] FPTS Intern pager: [phone], text pages welcome Secure chat group [facility] Teaching Service

## 2022-09-18 NOTE — Assessment & Plan Note (Addendum)
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 Pneumo  - Continue Rocephin for total of 6 weeks (day 15, 7/1-8/12), patient is not a candidate for outpatient therapy due to lack of insurance and history of IVDU.

## 2022-09-19 LAB — BASIC METABOLIC PANEL
Anion gap: 7 (ref 5–15)
BUN: 7 mg/dL — ABNORMAL LOW (ref 8–23)
CO2: 27 mmol/L (ref 22–32)
Calcium: 8.2 mg/dL — ABNORMAL LOW (ref 8.9–10.3)
Chloride: 99 mmol/L (ref 98–111)
Creatinine, Ser: 0.65 mg/dL (ref 0.61–1.24)
GFR, Estimated: >60 mL/min (ref >60)
Glucose, Bld: 105 mg/dL — ABNORMAL HIGH (ref 70–99)
Potassium: 3.9 mmol/L (ref 3.5–5.1)
Sodium: 133 mmol/L — ABNORMAL LOW (ref 135–145)

## 2022-09-19 LAB — CBC
HCT: 27.7 % — ABNORMAL LOW (ref 39.0–52.0)
Hemoglobin: 9.5 g/dL — ABNORMAL LOW (ref 13.0–17.0)
MCH: 30.5 pg (ref 26.0–34.0)
MCHC: 34.3 g/dL (ref 30.0–36.0)
MCV: 89.1 fL (ref 80.0–100.0)
Platelets: 384 10*3/uL (ref 150–400)
RBC: 3.11 MIL/uL — ABNORMAL LOW (ref 4.22–5.81)
RDW: 15.1 % (ref 11.5–15.5)
WBC: 8.3 10*3/uL (ref 4.0–10.5)
nRBC: 0 % (ref 0.0–0.2)

## 2022-09-19 NOTE — TOC Progression Note (Signed)
Transition of Care [facility]) - Progression Note    Patient Details  Name: Christopher Farrell MRN: [id number] Date of Birth: 1960-10-02  Transition of Care [facility]) CM/SW Contact  Kermit Balo, RN Phone Number: 09/19/2022, 1:18 PM  Clinical Narrative:    Pt will remain in the hospital for his 6 weeks of Iv abx. End date: 8/12 TOC following.   Expected Discharge Plan: Home/Self Care    Expected Discharge Plan and Services                                               Social Determinants of Health (SDOH) Interventions SDOH Screenings   Food Insecurity: No Food Insecurity (09/13/2022)  Housing: Patient Declined (09/13/2022)  Transportation Needs: No Transportation Needs (09/13/2022)  Utilities: Not At Risk (09/13/2022)  Depression (PHQ2-9): High Risk (09/02/2022)  Tobacco Use: High Risk (09/13/2022)    Readmission Risk Interventions     No data to display

## 2022-09-19 NOTE — Progress Notes (Signed)
Daily Progress Note Intern Pager: [phone]  Patient name: Christopher Farrell Medical record number: [id number] Date of birth: 1960-10-09 Age: 62 y.o. Gender: male  Primary Care Provider: Pcp, No Consultants: Cardiology, CCM, ID, neurology Code Status: Full  Pt Overview and Major Events to Date:  7/1: Admission and Intubation  7/2: Brain MRI 7/4: Extubated 7/5: Transferred to FMTS  7/8: TEE 7/9: Prolonged IV Rocephin for 6 weeks total 7/10: Started Lexapro 10 mg  Assessment and Plan: Christopher Farrell is a 62 y.o. 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  Not a candidate for outpatient IV antibiotics. Pertinent PMH/PSH includes HIV not on antiretrovirals. Patient remains clinically stable.  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 Pneumo  - Continue Rocephin for total of 6 weeks (day 15, 7/1-8/12), patient is  not a candidate for outpatient therapy due to lack of insurance and  history of IVDU.        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 No neurologic deficits compared to baseline.   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  Not a candidate for outpatient IV therapy.  Subjective:  Patient is seen in  bed this morning with breakfast at bedside.  He states he is doing well, he is just tired.  He is without complaint.  Objective: Temp:  [97.8 F (36.6 C)-99 F (37.2 C)] 97.9 F (36.6 C) (07/15 0735) Pulse Rate:  [76-89] 80 (07/15 0735) Resp:  [16-20] 16 (07/15 0400) BP: (80-91)/(59-67) 81/59 (07/15 0735) SpO2:  [97 %-100 %] 99 % (07/15 0735) Physical Exam: General: Pleasant, no acute distress Cardio: RRR Pulm: CTA bilaterally.  No increased work of breathing.  On room air. Abdominal: soft, non-tender, non-distended Extremities: no peripheral edema  Neuro: alert and oriented x3, speech normal in content. Psych:  Cognition and judgment appear intact. Alert, communicative  and cooperative with normal attention span and concentration.  Laboratory: Most recent CBC Lab Results  Component Value Date   WBC 8.0 09/12/2022   HGB 9.8 (L) 09/12/2022   HCT 29.0 (L) 09/12/2022   MCV 85.5 09/12/2022   PLT 322 09/12/2022   Most recent BMP    Latest Ref Rng & Units 09/13/2022   11:49 PM  BMP  Glucose 70 - 99 mg/dL 284   BUN 8 - 23 mg/dL 9   Creatinine 1.32 - 4.40 mg/dL 1.02   Sodium 725 - 366 mmol/L 132   Potassium 3.5 - 5.1 mmol/L 3.9   Chloride 98 - 111 mmol/L 100  CO2 22 - 32 mmol/L 25   Calcium 8.9 - 10.3 mg/dL 7.8     [name], DO 09/19/2022, 9:38 AM  PGY-1, [facility] FPTS Intern pager: [phone], text pages welcome Secure chat group [facility] Teaching Service

## 2022-09-19 NOTE — Progress Notes (Signed)
Physical Therapy Treatment Patient Details Name: Christopher Farrell MRN: [id number] DOB: 1960/11/05 Today's Date: 09/19/2022   History of Present Illness Pt is a 62 y/o male presenting on 7/1 with cough and AMS. Noted presented to ED on 6/26 and found with multifocal pneumonia. Returned to ED after being found unresponsive, admitted with sepsis. 2 seizures in ED. CSF reveals bacterial meningitis. MRI with "multifocal acute/subacute ischemia within the right greater than left hemispheres and left cerebellum. Largest lesions in R parietal and occipital lobes". Intubated 7/1-7/3. PMH: HIV.    PT Comments  Participates with encouragement today. Ambulatory up to 315 feet min guard for safety, no AD, would benefit likely from [facility] at this time but declines. Challenged with variable speeds and target finding in hallways. No overt LOB noted, reports discomfort with walking on hard floor; advised to have family bring shoes and pt agreeable for next visit. Practiced navigating stairs. Unable to find room without cues, attempting to have pt utilize signs to navigate back to room with notable cognitive difficulty. Patient will continue to benefit from skilled physical therapy services to further improve independence with functional mobility.      Assistance Recommended at Discharge Frequent or constant Supervision/Assistance  If plan is discharge home, recommend the following:  Can travel by private vehicle    Direct supervision/assist for medications management;Direct supervision/assist for financial management;Assist for transportation;A little help with walking and/or transfers;A little help with bathing/dressing/bathroom      Equipment Recommendations  None recommended by PT (pending progress)    Recommendations for Other Services Rehab consult     Precautions / Restrictions Precautions Precautions: Fall Precaution Comments: upon arrival, purewick laying beside pt in bed, not attached, looked as  if he had pulled it off but he had no memory of this. NT notified and pt given a urinal Restrictions Weight Bearing Restrictions: No     Mobility  Bed Mobility Overal bed mobility: Modified Independent             General bed mobility comments: no assistance needed to get to EOB    Transfers Overall transfer level: Needs assistance Equipment used: None Transfers: Sit to/from Stand, Bed to chair/wheelchair/BSC Sit to Stand: Supervision           General transfer comment: Supervision for safety, no AD, denies SPC for stability. Mild sway but able to self correct.    Ambulation/Gait Ambulation/Gait assistance: Min guard Gait Distance (Feet): 315 Feet Assistive device: None Gait Pattern/deviations: Step-through pattern, Decreased stride length, Drifts right/left, Wide base of support, Antalgic Gait velocity: decreased Gait velocity interpretation: <1.8 ft/sec, indicate of risk for recurrent falls   General Gait Details: Declines SPC use. Agreeable to challenge himself without AD. Pt requires min guard for safety. Frequent VC for direction, orientation on unit, and recall to find his room. Cues for safety, awareness, and target finding while ambulating. Challenged further with variable speeds (limited, not following instuctions well.) Could not utilize signage well to figure out way back to room. Increased sway and mildly antalgic pattern, reports feet get sore walking without shoes, will ask family to bring for next visit.   Stairs Stairs: Yes Stairs assistance: Min guard Stair Management: One rail Right, Step to pattern, Forwards Number of Stairs: 5 General stair comments: Min guard for safety, cues for sequencing, decreased control with Lt knee on descent. Able to self correct.   Wheelchair Mobility     Tilt Bed    Modified Rankin (Stroke Patients Only) Modified Rankin (  Stroke Patients Only) Pre-Morbid Rankin Score: No symptoms Modified Rankin: Moderate  disability     Balance Overall balance assessment: Needs assistance Sitting-balance support: Feet supported, No upper extremity supported Sitting balance-Leahy Scale: Good     Standing balance support: During functional activity, No upper extremity supported Standing balance-Leahy Scale: Fair                              Cognition Arousal/Alertness: Awake/alert Behavior During Therapy: Flat affect, Agitated Overall Cognitive Status: Impaired/Different from baseline Area of Impairment: Memory, Following commands, Safety/judgement, Problem solving, Awareness                   Current Attention Level: Sustained Memory: Decreased recall of precautions, Decreased short-term memory Following Commands: Follows one step commands with increased time, Follows one step commands inconsistently Safety/Judgement: Decreased awareness of safety, Decreased awareness of deficits Awareness: Intellectual Problem Solving: Slow processing, Requires verbal cues General Comments: Unable to find way back to room or utilize signage correctly to trace steps. He does recall his room number.        Exercises General Exercises - Lower Extremity Ankle Circles/Pumps: AROM, Both, 15 reps, Seated Quad Sets: Strengthening, Both, 5 reps, Seated Gluteal Sets: Strengthening, Both, 5 reps, Seated    General Comments        Pertinent Vitals/Pain Pain Assessment Pain Assessment: No/denies pain Pain Intervention(s): Monitored during session, Repositioned    Home Living                          Prior Function            PT Goals (current goals can now be found in the care plan section) Acute Rehab PT Goals Patient Stated Goal: did not state PT Goal Formulation: With patient/family Time For Goal Achievement: 09/22/22 Potential to Achieve Goals: Good Progress towards PT goals: Progressing toward goals    Frequency    Min 3X/week      PT Plan Current plan remains  appropriate    Co-evaluation              AM-PAC PT "6 Clicks" Mobility   Outcome Measure  Help needed turning from your back to your side while in a flat bed without using bedrails?: None Help needed moving from lying on your back to sitting on the side of a flat bed without using bedrails?: None Help needed moving to and from a bed to a chair (including a wheelchair)?: A Little Help needed standing up from a chair using your arms (e.g., wheelchair or bedside chair)?: A Little Help needed to walk in hospital room?: A Little Help needed climbing 3-5 steps with a railing? : A Little 6 Click Score: 20    End of Session Equipment Utilized During Treatment: Gait belt Activity Tolerance: Patient tolerated treatment well Patient left: with call bell/phone within reach;in chair;with chair alarm set;with SCD's reapplied Nurse Communication: Mobility status PT Visit Diagnosis: Unsteadiness on feet (R26.81);Other abnormalities of gait and mobility (R26.89);Muscle weakness (generalized) (M62.81);Difficulty in walking, not elsewhere classified (R26.2)     Time: 1152-1206 PT Time Calculation (min) (ACUTE ONLY): 14 min  Charges:    $Gait Training: 8-22 mins PT General Charges $$ ACUTE PT VISIT: 1 Visit                     Kathlyn Sacramento, PT, DPT [facility]  Rehabilitation  Services Physical Therapist Office: [phone] Website: [email or web address]    Berton Mount 09/19/2022, 2:27 PM

## 2022-09-20 ENCOUNTER — Inpatient Hospital Stay (HOSPITAL_COMMUNITY): Payer: Medicaid Other

## 2022-09-20 DIAGNOSIS — M25511 Pain in right shoulder: Secondary | ICD-10-CM | POA: Insufficient documentation

## 2022-09-20 MED ORDER — GADOBUTROL 1 MMOL/ML IV SOLN
4.5000 mL | Freq: Once | INTRAVENOUS | Status: AC | PRN
  Administered 2022-09-20: 4.5 mL via INTRAVENOUS

## 2022-09-20 MED ORDER — ACETAMINOPHEN 325 MG PO TABS
650.0000 mg | ORAL_TABLET | Freq: Four times a day (QID) | ORAL | Status: DC | PRN
  Administered 2022-09-20 – 2022-10-01 (×11): 650 mg via ORAL
  Filled 2022-09-20 (×11): qty 2

## 2022-09-20 NOTE — Assessment & Plan Note (Signed)
 Secondary to infective endocarditis  - continue Rocephin for 6 week course as above

## 2022-09-20 NOTE — Progress Notes (Signed)
Daily Progress Note Intern Pager: [phone]  Patient name: Christopher Farrell Medical record number: [id number] Date of birth: 1960/08/25 Age: 62 y.o. Gender: male  Primary Care Provider: Pcp, No Consultants: Cardiology, CCM, ID, neurology Code Status: Full  Pt Overview and Major Events to Date:  7/1: Admission and Intubation  7/2: Brain MRI 7/4: Extubated 7/5: Transferred to FMTS  7/8: TEE 7/9: Prolonged IV Rocephin for 6 weeks total 7/10: Started Lexapro 10 mg  Assessment and Plan: Christopher Farrell is a 62 y.o. 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  Not a candidate for outpatient IV antibiotics. Pertinent PMH/PSH includes HIV not on antiretrovirals. Patient remains clinically stable.  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 Pneumo  - Continue Rocephin for total of 6 weeks (day 15, 7/1-8/12), patient is  not a candidate for outpatient therapy due to lack of insurance and  history of IVDU.        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 No neurologic deficits compared to baseline.   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  Full range of motion  intact. - Continue to monitor - Consider x-ray if persistent pain       FEN/GI: Regular diet PPx:  SCDs, out of bed Dispo:Ending completion of IV antibiotic therapy. Not a candidate for outpatient IV therapy.   Subjective:  Patient seen resting in bed this morning.  He reports good appetite.  He does complain of right shoulder pain and stiffness which he thinks may be related to sleeping wrong.  He maintains good range of motion and strength in the arm.  Objective: Temp:  [98.3 F (36.8 C)-98.9 F (37.2 C)] 98.3 F (36.8 C) (07/16 0744) Pulse Rate:  [76-84] 83 (07/16 0744) Resp:  [16-20] 16 (07/16 0744) BP: (71-139)/(56-116) 139/116 (07/16 0800) SpO2:  [97 %-100 %] 97 % (07/16 0744) Physical Exam: General: Pleasant, no acute distress Cardiovascular: RRR Respiratory: CTA bilaterally.  No increased work of breathing.  On room air. Abdomen: Soft, nontender, nondistended Extremities: No peripheral edema.  Full range of motion intact in right shoulder  Laboratory: Most recent CBC Lab Results  Component Value Date   WBC 8.3 09/19/2022   HGB 9.5 (L) 09/19/2022   HCT 27.7 (L) 09/19/2022   MCV 89.1 09/19/2022   PLT 384 09/19/2022   Most recent BMP    Latest Ref Rng & Units 09/19/2022    1:50 PM  BMP  Glucose 70 - 99 mg/dL 308   BUN 8 - 23 mg/dL 7   Creatinine 6.57 - 8.46 mg/dL 9.62  Sodium 135 - 145 mmol/L 133   Potassium 3.5 - 5.1 mmol/L 3.9   Chloride 98 - 111 mmol/L 99   CO2 22 - 32 mmol/L 27   Calcium 8.9 - 10.3 mg/dL 8.2     [name], DO 09/20/2022, 9:32 AM  PGY-1, [facility] FPTS Intern pager: [phone], text pages welcome Secure chat group [facility] Teaching Service

## 2022-09-20 NOTE — Assessment & Plan Note (Signed)
Patient complains of right shoulder pain on 7/16.  Full range of motion intact. - Continue to monitor - Consider x-ray if persistent pain

## 2022-09-20 NOTE — Progress Notes (Signed)
1755 Pt has left floor via wheelchair with transport for MRI.

## 2022-09-20 NOTE — Progress Notes (Signed)
[facility]  Date of Admission:  09/05/2022      Total days of antibiotics 15   Ceftriaxone 7/01 >> c          ASSESSMENT: Christopher Farrell is a 62 y.o. 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 BPs remain soft over night. Overall appears compensated from HF perspective but not sure he is moving around much.   Uncontrolled HIV Disease, CD4 216 and VL 102,000 7/01 -continue biktarvy once daily. Plan to repeat VL at end of July for therapeutic monitoring  Malnutrition / Deconditioning -  -with possible surgery upcoming encouraged him to continue to work on ambulating multiple times a day with nursing staff and PT.    PLAN: Continue IV Ceftriaxone - BID dosing through 7/22 then QD through 8/12   Will check in with him weekly while here inpatient.    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 Has noticed it during some activities but can't really convey what activities he noticed it with.   O/N BPs have been soft with systolic 80s.    Review of Systems: Review of Systems  Constitutional:  Negative for chills and fever.  Musculoskeletal:  Positive for joint pain (right shoulder).  Neurological:  Negative for dizziness, weakness  and headaches.  All other systems reviewed and are negative.   No Known Allergies  OBJECTIVE: Vitals:   09/19/22 1946 09/19/22 2314 09/20/22 0744 09/20/22 0800  BP: (!) 85/60 (!) 83/74 (!) 71/56 (!) 139/116  Pulse: 84 82 83   Resp: 17 18 16    Temp: 98.6 F (37 C) 98.9 F (37.2 C) 98.3 F (36.8 C)   TempSrc: Oral Oral Oral   SpO2: 99% 98% 97%   Weight:      Height:       Body mass index is 18.54 kg/m.   Physical Exam  Lab Results Lab Results  Component Value Date   WBC 8.3 09/19/2022   HGB 9.5 (L) 09/19/2022   HCT 27.7 (L) 09/19/2022   MCV 89.1 09/19/2022   PLT 384 09/19/2022    Lab Results  Component Value Date   CREATININE 0.65 09/19/2022   BUN 7 (L) 09/19/2022   NA 133 (L) 09/19/2022   K 3.9 09/19/2022   CL 99 09/19/2022   CO2 27 09/19/2022    Lab Results  Component Value Date   ALT 36 09/05/2022   AST 94 (H) 09/05/2022   ALKPHOS 42 09/05/2022   BILITOT 0.8 09/05/2022     Microbiology: No results found for this or any previous visit (from the past 240 hour(s)).   Rexene Alberts, MSN, NP-C [facility]  [email or web address] Pager: [phone]  Office: [phone] [facility] Main Line: [phone] *Secure Chat Communication Welcome

## 2022-09-20 NOTE — Assessment & Plan Note (Signed)
Mental status at baseline. No neurologic deficits compared to baseline.  - Continue 6 week Rocephin treatment for endocarditis   - STAT head CT for any change in neuro exam  - BMP weekly  -Repeat brain MRI per ID

## 2022-09-20 NOTE — Assessment & Plan Note (Signed)
Continue biktarvy 

## 2022-09-21 ENCOUNTER — Inpatient Hospital Stay (HOSPITAL_COMMUNITY): Payer: Medicaid Other

## 2022-09-21 LAB — BASIC METABOLIC PANEL
Anion gap: 8 (ref 5–15)
BUN: 8 mg/dL (ref 8–23)
CO2: 29 mmol/L (ref 22–32)
Calcium: 8.6 mg/dL — ABNORMAL LOW (ref 8.9–10.3)
Chloride: 97 mmol/L — ABNORMAL LOW (ref 98–111)
Creatinine, Ser: 0.61 mg/dL (ref 0.61–1.24)
GFR, Estimated: >60 mL/min (ref >60)
Glucose, Bld: 100 mg/dL — ABNORMAL HIGH (ref 70–99)
Potassium: 4 mmol/L (ref 3.5–5.1)
Sodium: 134 mmol/L — ABNORMAL LOW (ref 135–145)

## 2022-09-21 NOTE — Progress Notes (Signed)
Occupational Therapy Treatment Patient Details Name: Christopher Farrell MRN: [id number] DOB: Apr 18, 1960 Today's Date: 09/21/2022   History of present illness Pt is a 62 y/o male presenting on 7/1 with cough and AMS. Noted presented to ED on 6/26 and found with multifocal pneumonia. Returned to ED after being found unresponsive, admitted with sepsis. 2 seizures in ED. CSF reveals bacterial meningitis. MRI with "multifocal acute/subacute ischemia within the right greater than left hemispheres and left cerebellum. Largest lesions in R parietal and occipital lobes". Intubated 7/1-7/3. PMH: HIV.   OT comments  Pt meeting all established OT goals, thus, goals updated this session. Pt requiring min encouragement to participate in OT session. Pt performing path finding in hall with min guard A and cues for following 2 step directions and memory. Washing hands at sink with min guard A due to decreased balance with reaching. Able to recall and demonstrate one exercise from UE HEP. Pt with limitations in balance and cognition. Will continue to follow acutely.    Recommendations for follow up therapy are one component of a multi-disciplinary discharge planning process, led by the attending physician.  Recommendations may be updated based on patient status, additional functional criteria and insurance authorization.    Assistance Recommended at Discharge Frequent or constant Supervision/Assistance  Patient can return home with the following  Assistance with cooking/housework;Direct supervision/assist for medications management;Direct supervision/assist for financial management;Help with stairs or ramp for entrance;Assist for transportation;A little help with walking and/or transfers;A little help with bathing/dressing/bathroom   Equipment Recommendations  None recommended by OT    Recommendations for Other Services      Precautions / Restrictions Precautions Precautions: Fall Restrictions Weight Bearing  Restrictions: No       Mobility Bed Mobility Overal bed mobility: Modified Independent                  Transfers Overall transfer level: Needs assistance Equipment used: None Transfers: Sit to/from Stand Sit to Stand: Supervision           General transfer comment: Supervision for safety, no AD, denies SPC for stability. Mild sway but able to self correct.     Balance Overall balance assessment: Needs assistance Sitting-balance support: Feet supported, No upper extremity supported Sitting balance-Leahy Scale: Good     Standing balance support: During functional activity, No upper extremity supported Standing balance-Leahy Scale: Fair                             ADL either performed or assessed with clinical judgement   ADL Overall ADL's : Needs assistance/impaired     Grooming: Wash/dry hands;Min guard;Standing                               Functional mobility during ADLs: Min guard      Extremity/Trunk Assessment              Vision       Perception     Praxis      Cognition Arousal/Alertness: Awake/alert Behavior During Therapy: Flat affect, Agitated Overall Cognitive Status: Impaired/Different from baseline Area of Impairment: Memory, Following commands, Safety/judgement, Problem solving, Awareness                   Current Attention Level: Sustained Memory: Decreased recall of precautions, Decreased short-term memory Following Commands: Follows one step commands with increased time, Follows one step  commands inconsistently Safety/Judgement: Decreased awareness of safety, Decreased awareness of deficits Awareness: Intellectual Problem Solving: Slow processing, Requires verbal cues General Comments: Unable to find way back to room or utilize signage correctly to trace steps. He does recall his room number. Poor ability to follow multistep commands. Pt also with poor awareness of deficits.         Exercises      Shoulder Instructions       General Comments      Pertinent Vitals/ Pain       Pain Assessment Pain Assessment: Faces Faces Pain Scale: Hurts a little bit Pain Location: Reporting pain in shoulders Pain Descriptors / Indicators: Discomfort Pain Intervention(s): Limited activity within patient's tolerance, Monitored during session  Home Living                                          Prior Functioning/Environment              Frequency  Min 2X/week        Progress Toward Goals  OT Goals(current goals can now be found in the care plan section)  Progress towards OT goals: Progressing toward goals  Acute Rehab OT Goals Patient Stated Goal: watch TV OT Goal Formulation: With patient Time For Goal Achievement: 10/05/22 Potential to Achieve Goals: Good  Plan Frequency remains appropriate    Co-evaluation                 AM-PAC OT "6 Clicks" Daily Activity     Outcome Measure   Help from another person eating meals?: A Little Help from another person taking care of personal grooming?: A Little Help from another person toileting, which includes using toliet, bedpan, or urinal?: A Little Help from another person bathing (including washing, rinsing, drying)?: A Little Help from another person to put on and taking off regular upper body clothing?: A Little Help from another person to put on and taking off regular lower body clothing?: A Little 6 Click Score: 18    End of Session    OT Visit Diagnosis: Other abnormalities of gait and mobility (R26.89);Muscle weakness (generalized) (M62.81);Pain;Other symptoms and signs involving cognitive function;Cognitive communication deficit (R41.841);Other symptoms and signs involving the nervous system (R29.898)   Activity Tolerance Patient tolerated treatment well   Patient Left in chair;with call bell/phone within reach;with chair alarm set   Nurse Communication Mobility status         Time: 1045-1100 OT Time Calculation (min): 15 min  Charges: OT General Charges $OT Visit: 1 Visit OT Treatments $Self Care/Home Management : 8-22 mins  Tyler Deis, OTR/L [facility] Acute Rehabilitation Office: [phone]   Myrla Halsted 09/21/2022, 11:23 AM

## 2022-09-21 NOTE — Assessment & Plan Note (Signed)
 Secondary to infective endocarditis  - continue Rocephin for 6 week course as above

## 2022-09-21 NOTE — Progress Notes (Signed)
Speech Language Pathology Treatment: Cognitive-Linquistic  Patient Details Name: Christopher Farrell MRN: [id number] DOB: Jan 11, 1961 Today's Date: 09/21/2022 Time: 8295-6213 SLP Time Calculation (min) (ACUTE ONLY): 12 min  Assessment / Plan / Recommendation Clinical Impression  Pt seen for cognitive treatment focused on initiation and identifying/verbalizing needs. Pt had 100% accuracy in a card sorting activity. When asked to participate in a game of UNO, pt became visibly agitated, but agreed to play. SLP provided detailed instructions regarding the rules and reinforced the importance of asking for help when necessary. Pt demonstrated adequate sustained attention to complete the game, although showed significantly decreased initiation, requiring Mod verbal cues to play a card or ask for assistance. During a conversation, pt answered all biographical questions without asking any reciprocal questions. SLP will continue to f/u 1x/week to address attention and initiation through functional treatment activities.    HPI HPI: Pt is a 62 y/o male presenting on 7/1 with cough and AMS. 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 Dx infective endocarditis - will require six weeks of abx, ending 8/12. Intubated 7/1-3.  MRI with "multifocal acute/subacute ischemia within the right greater than left hemispheres and left cerebellum. Largest lesions in R parietal and occipital lobes".  PMH: HIV.      SLP Plan  Continue with current plan of care      Recommendations for follow up therapy are one component of a multi-disciplinary discharge planning process, led by the attending physician.  Recommendations may be updated based on patient status, additional functional criteria and insurance authorization.    Recommendations                     Oral care BID   Frequent or constant Supervision/Assistance Cognitive communication deficit (Y86.578)     Continue with current  plan of care     Gwynneth Aliment, M.A., CF-SLP Speech Language Pathology, Acute Rehabilitation Services  Secure Chat preferred [phone]   09/21/2022, 1:23 PM

## 2022-09-21 NOTE — Assessment & Plan Note (Signed)
Mental status at baseline. No neurologic deficits compared to baseline.  - Continue 6 week Rocephin treatment for endocarditis   - STAT head CT for any change in neuro exam  - BMP weekly  -Repeat brain MRI with questionable hemorrhage; order head CT to rule out

## 2022-09-21 NOTE — Progress Notes (Signed)
Physical Therapy Treatment Patient Details Name: Christopher Farrell MRN: [id number] DOB: 1960/10/26 Today's Date: 09/21/2022   History of Present Illness Pt is a 62 y/o male presenting on 7/1 with cough and AMS. Noted presented to ED on 6/26 and found with multifocal pneumonia. Returned to ED after being found unresponsive, admitted with sepsis. 2 seizures in ED. CSF reveals bacterial meningitis. MRI with "multifocal acute/subacute ischemia within the right greater than left hemispheres and left cerebellum. Largest lesions in R parietal and occipital lobes". Intubated 7/1-7/3. PMH: HIV.    PT Comments  Limited participation today, seemingly more agitated than usual. Reluctantly agreeable to work with therapy but refused to allow BERG or DGI testing to assess balance and fall risk. He did ambulate at a supervision level >200 feet without an assistive device, with minor instability and deviations noted but no overt LOB or buckling. Goals updated. Would not participate with dynamic challenges. States he has asked his mother to bring shoes and she will do this hopefully today. (May have better participation once shoes delivered.)  May set a DGI goal for low fall risk score and once this is met may consider d/c from acute rehab; potentially update to outpatient PT rather than HHPT if he can participate further with testing. Patient will continue to benefit from skilled physical therapy services to further improve independence with functional mobility.     Assistance Recommended at Discharge Frequent or constant Supervision/Assistance  If plan is discharge home, recommend the following:  Can travel by private vehicle    Direct supervision/assist for medications management;Direct supervision/assist for financial management;Assist for transportation;A little help with walking and/or transfers;A little help with bathing/dressing/bathroom      Equipment Recommendations  None recommended by PT     Recommendations for Other Services       Precautions / Restrictions Precautions Precautions: Fall Restrictions Weight Bearing Restrictions: No     Mobility  Bed Mobility Overal bed mobility: Modified Independent             General bed mobility comments: no assistance needed to get to EOB    Transfers Overall transfer level: Modified independent Equipment used: None               General transfer comment: Mod I stable to rise and sit from various surfaces, wider BOS.    Ambulation/Gait Ambulation/Gait assistance: Supervision Gait Distance (Feet): 250 Feet Assistive device: None Gait Pattern/deviations: Step-through pattern, Decreased stride length, Drifts right/left, Wide base of support Gait velocity: decreased Gait velocity interpretation: <1.8 ft/sec, indicate of risk for recurrent falls   General Gait Details: Minor instability with forward gait. Declines to participate with DGI instructions. Supervision for safety with basic forward ambulation. No overt LOB, buckling or stumbling noted. Pt with limited interest in participating in gait training today.   Stairs             Wheelchair Mobility     Tilt Bed    Modified Rankin (Stroke Patients Only) Modified Rankin (Stroke Patients Only) Pre-Morbid Rankin Score: No symptoms Modified Rankin: Moderate disability     Balance Overall balance assessment: Needs assistance Sitting-balance support: Feet supported, No upper extremity supported Sitting balance-Leahy Scale: Good     Standing balance support: During functional activity, No upper extremity supported Standing balance-Leahy Scale: Fair                              Cognition Arousal/Alertness: Awake/alert Behavior  During Therapy: Flat affect, Agitated Overall Cognitive Status: Impaired/Different from baseline Area of Impairment: Memory, Following commands, Safety/judgement, Problem solving, Awareness                  Orientation Level: Disoriented to, Time, Situation Current Attention Level: Sustained Memory: Decreased recall of precautions, Decreased short-term memory Following Commands: Follows one step commands with increased time, Follows one step commands inconsistently Safety/Judgement: Decreased awareness of safety, Decreased awareness of deficits Awareness: Intellectual Problem Solving: Slow processing, Requires verbal cues          Exercises      General Comments        Pertinent Vitals/Pain Pain Assessment Pain Assessment: No/denies pain    Home Living                          Prior Function            PT Goals (current goals can now be found in the care plan section) Acute Rehab PT Goals Patient Stated Goal: did not state PT Goal Formulation: With patient/family Time For Goal Achievement: 09/28/22 Potential to Achieve Goals: Good Progress towards PT goals: Progressing toward goals    Frequency    Min 3X/week      PT Plan Current plan remains appropriate    Co-evaluation              AM-PAC PT "6 Clicks" Mobility   Outcome Measure  Help needed turning from your back to your side while in a flat bed without using bedrails?: None Help needed moving from lying on your back to sitting on the side of a flat bed without using bedrails?: None Help needed moving to and from a bed to a chair (including a wheelchair)?: None Help needed standing up from a chair using your arms (e.g., wheelchair or bedside chair)?: None Help needed to walk in hospital room?: A Little Help needed climbing 3-5 steps with a railing? : A Little 6 Click Score: 22    End of Session Equipment Utilized During Treatment: Gait belt Activity Tolerance: Patient tolerated treatment well (self limiting) Patient left: with call bell/phone within reach;in chair;with chair alarm set;with SCD's reapplied Nurse Communication: Mobility status PT Visit Diagnosis: Unsteadiness on feet  (R26.81);Other abnormalities of gait and mobility (R26.89);Muscle weakness (generalized) (M62.81);Difficulty in walking, not elsewhere classified (R26.2)     Time: 6213-0865 PT Time Calculation (min) (ACUTE ONLY): 10 min  Charges:    $Gait Training: 8-22 mins PT General Charges $$ ACUTE PT VISIT: 1 Visit                     Kathlyn Sacramento, PT, DPT [facility] Health  Rehabilitation Services Physical Therapist Office: [phone] Website: [email or web address]    Berton Mount 09/21/2022, 2:41 PM

## 2022-09-21 NOTE — Progress Notes (Signed)
Daily Progress Note Intern Pager: [phone]  Patient name: Christopher Farrell Medical record number: [id number] Date of birth: 1960/08/27 Age: 62 y.o. Gender: male  Primary Care Provider: Pcp, No Consultants: Radiology, CCM, ID, neurology Code Status: Full  Pt Overview and Major Events to Date:  7/1: Admission and Intubation  7/2: Brain MRI 7/4: Extubated 7/5: Transferred to FMTS  7/8: TEE 7/9: Prolonged IV Rocephin for 6 weeks total 7/10: Started Lexapro 10 mg 7/16: Brain MRI 7/17: CT head  Assessment and Plan: Christopher Farrell is a 62 y.o. 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  Not a candidate for outpatient IV antibiotics. Pertinent PMH/PSH includes HIV not on antiretrovirals. Patient remains clinically stable.  Patient did have CT head this morning to rule out hemorrhage possibly seen on MRI brain yesterday.  This is still pending.  Patient did not have any mental status change for neurologic abnormalities on exam today.  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 Pneumo  - Continue Rocephin for total of 6 weeks (day 15, 7/1-8/12), patient is  not a candidate for outpatient therapy due to lack of insurance and  history of IVDU.        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 No neurologic deficits compared to baseline.   - Continue 6 week Rocephin treatment for endocarditis   - STAT head CT for any change in neuro exam  - BMP weekly  -Repeat brain MRI with questionable hemorrhage; order head CT to rule out        Cerebral septic emboli (HCC)      Secondary to infective endocarditis  - continue Rocephin for 6 week course as above        Shoulder pain, right     Patient complains of right shoulder pain on 7/16.  Full range of motion  intact. - Continue to monitor - Consider x-ray if persistent pain       FEN/GI: Regular diet PPx: SCDs, out of bed Dispo: Completion of IV antibiotic therapy.  Not a candidate for outpatient IV therapy.  Subjective:  Patient seen resting comfortably in bed today.  He does complain of stiffness and discomfort, but states he is able to get up and walk around the room and sit up in his chair comfortably throughout the day.  He has a good appetite.  He denies any other concerns at this time.  Objective: Temp:  [98 F (36.7 C)-98.2 F (36.8 C)] 98.2 F (36.8 C) (07/17 0728) Pulse Rate:  [78-79] 79 (07/17 0728) Resp:  [16-20] 16 (07/17 0728) BP: (86-94)/(62-68) 94/68 (07/17 0728) SpO2:  [99 %-100 %] 100 % (07/17 0728) Physical Exam: General: Pleasant, no acute distress Cardiovascular: RRR, no murmurs Respiratory: CTA bilaterally.  No increased work of breathing.  On room air. Abdomen: Soft, nontender, nondistended Extremities: No peripheral edema.  Moves all extremities equally. Neuro: alert and oriented x3, speech normal in content, PERRLA. Psych:  Cognition and judgment appear intact. Alert, communicative  and cooperative.  Laboratory: Most recent CBC Lab Results  Component Value Date   WBC 8.3 09/19/2022   HGB 9.5 (L) 09/19/2022   HCT 27.7 (L) 09/19/2022   MCV 89.1 09/19/2022   PLT 384 09/19/2022   Most recent BMP    Latest Ref Rng & Units 09/21/2022    4:50 AM  BMP  Glucose 70 - 99 mg/dL 409   BUN 8 - 23 mg/dL 8   Creatinine 8.11 - 9.14 mg/dL 7.82   Sodium 956 - 213 mmol/L 134   Potassium 3.5 - 5.1 mmol/L 4.0   Chloride 98 - 111 mmol/L 97   CO2 22 - 32 mmol/L 29   Calcium 8.9 - 10.3 mg/dL 8.6    Imaging/Diagnostic Tests: 09/20/2022 brain MRI: 1. Punctate foci of acute  ischemia within the left corona radiata and right occipital lobe. No hemorrhage or mass effect. 2. Area of abnormal DWI signal within the extra-axial space overlying both convexities, left-greater-than-right. No corresponding abnormality on the FLAIR sequence. This may be artifactual, but correlation with noncontrast head CT may be helpful to exclude hemorrhage. 3. 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   Christopher Skeeters, DO 09/21/2022, 12:22 PM  PGY-1, [facility] Health Family Medicine FPTS Intern pager: [phone], text pages welcome Secure chat group [facility] Teaching Service

## 2022-09-22 ENCOUNTER — Ambulatory Visit: Payer: Self-pay | Admitting: Internal Medicine

## 2022-09-22 NOTE — Assessment & Plan Note (Signed)
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 Pneumo  - Continue Rocephin for total of 6 weeks (day 15, 7/1-8/12), patient is not a candidate for outpatient therapy due to lack of insurance and history of IVDU.

## 2022-09-22 NOTE — Assessment & Plan Note (Signed)
Continue biktarvy 

## 2022-09-22 NOTE — Progress Notes (Addendum)
Nutrition Follow-up  DOCUMENTATION CODES:  Severe malnutrition in context of chronic illness  INTERVENTION:  Continue current diet as recommended per SLP Ordering assistance Tray set-up assistance Ensure Enlive po TID, each supplement provides 350 kcal and 20 grams of protein. Mighty Shake TID, each supplement provides 330kcal and 9g protein Magic cup TID with meals, each supplement provides 290 kcal and 9 grams of protein MVI with minerals daily, thiamine daily Request new standing weight  NUTRITION DIAGNOSIS:  Severe Malnutrition related to chronic illness (HIV) as evidenced by severe fat depletion, severe muscle depletion. - remains applicable  GOAL:  Patient will meet greater than or equal to 90% of their needs - not progressing, pulled cortrak, poor PO  MONITOR:  PO intake, Supplement acceptance, Diet advancement, Labs  REASON FOR ASSESSMENT:  Consult Assessment of nutrition requirement/status  ASSESSMENT:  Pt with hx of HIV (not compliant with antivirals) and EtOH/tobacco abuse presented to ED with AMS after recently being diagnosed with pneumonia. Found to be septic and then suffered a seizure in ED.  7/1 - intubated  7/3 - cortrak placed (mid to distal stomach), extubated, pt pulled cortrak during PM shift 7/4 - SLP bedside evaluation, DYS1/thin liquids 7/8 - TEE findings, Large vegetation of the anterior mitral leaflet. There is an associated perforation of the anterior leaflet with severe eccentric mitral insufficiency, with a highly eccentric posteriorly-directed jet.   7/10 - SLP evaluation, advanced to regular with thin liquids  Pt resting in bed at the time of assessment eating a snack. Does endorse that his appetite has gotten better over the last few weeks. Continues to drink ensure, one noted at bedside. No recent weight, will request new standing.   Pt overall stable, will remains admitted for IV antibiotic administration.   Average Meal Intake: 7/4-7/7:  9% intake x 6 recorded meals 7/9-7/10: 20% intake x 4 recorded meals 7/11-7/14: 68% intake x 4 recorded meals  Nutritionally Relevant Medications: Scheduled Meds:  bictegravir-emtricitabine-tenofovir AF  1 tablet Oral Daily   Ensure Enlive  237 mL Oral TID BM   thiamine  100 mg Oral Daily   Continuous Infusions:  cefTRIAXone (ROCEPHIN)  IV 2 g (09/22/22 0740)   Labs Reviewed: Na 134, chloride 97 CBG ranges from 86-121 mg/dL over the last 24 hours HgbA1c 6.4%  NUTRITION - FOCUSED PHYSICAL EXAM: Flowsheet Row Most Recent Value  Orbital Region Unable to assess  [forehead obsured by equipment]  Upper Arm Region Severe depletion  Thoracic and Lumbar Region Severe depletion  Buccal Region Unable to assess  [ETT holder blocking view]  Temple Region Unable to assess  [forehead obsecured by equipement]  Clavicle Bone Region Moderate depletion  Clavicle and Acromion Bone Region Severe depletion  Scapular Bone Region Severe depletion  Dorsal Hand Moderate depletion  Patellar Region Severe depletion  Anterior Thigh Region Severe depletion  Posterior Calf Region Severe depletion  Edema (RD Assessment) None  Hair Reviewed  Eyes Unable to assess  Mouth Reviewed  Skin Reviewed  Nails Reviewed    Diet Order:   Diet Order             Diet regular Room service appropriate? Yes with Assist; Fluid consistency: Thin  Diet effective now                   EDUCATION NEEDS:  Not appropriate for education at this time  Skin:  Skin Assessment: Reviewed RN Assessment  Last BM:  7/18  Height:  Ht Readings from Last 1  Encounters:  09/13/22 5\' 4"  (1.626 m)    Weight:  Wt Readings from Last 1 Encounters:  09/13/22 49 kg    Ideal Body Weight:  59.1 kg  BMI:  Body mass index is 18.54 kg/m.  Estimated Nutritional Needs:  Kcal:  1700-2000 kcal/d Protein:  85-100 g/d Fluid:  >/=1.8L/d    Greig Castilla, RD, LDN Clinical Dietitian RD pager # available in [facility]  After  hours/weekend pager # available in [facility]

## 2022-09-22 NOTE — TOC Progression Note (Signed)
Transition of Care [facility]) - Progression Note    Patient Details  Name: NAHZIR POHLE MRN: [id number] Date of Birth: Dec 26, 1960  Transition of Care [facility]) CM/SW Contact  Kermit Balo, RN Phone Number: 09/22/2022, 9:58 AM  Clinical Narrative:    Pt will remain in the hospital for his 6weeks of IV abx.  TOC following.   Expected Discharge Plan: Home/Self Care    Expected Discharge Plan and Services                                               Social Determinants of Health (SDOH) Interventions SDOH Screenings   Food Insecurity: No Food Insecurity (09/13/2022)  Housing: Patient Declined (09/13/2022)  Transportation Needs: No Transportation Needs (09/13/2022)  Utilities: Not At Risk (09/13/2022)  Depression (PHQ2-9): High Risk (09/02/2022)  Tobacco Use: High Risk (09/13/2022)    Readmission Risk Interventions     No data to display

## 2022-09-22 NOTE — Plan of Care (Signed)
  Problem: Education: Goal: Ability to describe self-care measures that may prevent or decrease complications (Diabetes Survival Skills Education) will improve Outcome: Progressing Goal: Individualized Educational Video(s) Outcome: Progressing   Problem: Coping: Goal: Ability to adjust to condition or change in health will improve Outcome: Progressing   Problem: Fluid Volume: Goal: Ability to maintain a balanced intake and output will improve Outcome: Progressing   Problem: Health Behavior/Discharge Planning: Goal: Ability to identify and utilize available resources and services will improve Outcome: Progressing Goal: Ability to manage health-related needs will improve Outcome: Progressing   Problem: Metabolic: Goal: Ability to maintain appropriate glucose levels will improve Outcome: Progressing   Problem: Nutritional: Goal: Maintenance of adequate nutrition will improve Outcome: Progressing Goal: Progress toward achieving an optimal weight will improve Outcome: Progressing   Problem: Skin Integrity: Goal: Risk for impaired skin integrity will decrease Outcome: Progressing   Problem: Tissue Perfusion: Goal: Adequacy of tissue perfusion will improve Outcome: Progressing   Problem: Education: Goal: Knowledge of General Education information will improve Description: Including pain rating scale, medication(s)/side effects and non-pharmacologic comfort measures Outcome: Progressing   Problem: Health Behavior/Discharge Planning: Goal: Ability to manage health-related needs will improve Outcome: Progressing   Problem: Clinical Measurements: Goal: Ability to maintain clinical measurements within normal limits will improve Outcome: Progressing Goal: Will remain free from infection Outcome: Progressing Goal: Diagnostic test results will improve Outcome: Progressing Goal: Respiratory complications will improve Outcome: Progressing Goal: Cardiovascular complication will  be avoided Outcome: Progressing   Problem: Activity: Goal: Risk for activity intolerance will decrease Outcome: Progressing   Problem: Nutrition: Goal: Adequate nutrition will be maintained Outcome: Progressing   Problem: Coping: Goal: Level of anxiety will decrease Outcome: Progressing   Problem: Elimination: Goal: Will not experience complications related to bowel motility Outcome: Progressing Goal: Will not experience complications related to urinary retention Outcome: Progressing   Problem: Safety: Goal: Ability to remain free from injury will improve Outcome: Progressing   Problem: Skin Integrity: Goal: Risk for impaired skin integrity will decrease Outcome: Progressing   Problem: Education: Goal: Knowledge of disease or condition will improve Outcome: Progressing Goal: Knowledge of secondary prevention will improve (MUST DOCUMENT ALL) Outcome: Progressing Goal: Knowledge of patient specific risk factors will improve Loraine Leriche N/A or DELETE if not current risk factor) Outcome: Progressing   Problem: Ischemic Stroke/TIA Tissue Perfusion: Goal: Complications of ischemic stroke/TIA will be minimized Outcome: Progressing   Problem: Coping: Goal: Will verbalize positive feelings about self Outcome: Progressing Goal: Will identify appropriate support needs Outcome: Progressing   Problem: Health Behavior/Discharge Planning: Goal: Ability to manage health-related needs will improve Outcome: Progressing Goal: Goals will be collaboratively established with patient/family Outcome: Progressing   Problem: Self-Care: Goal: Ability to participate in self-care as condition permits will improve Outcome: Progressing Goal: Verbalization of feelings and concerns over difficulty with self-care will improve Outcome: Progressing Goal: Ability to communicate needs accurately will improve Outcome: Progressing   Problem: Nutrition: Goal: Risk of aspiration will decrease Outcome:  Progressing Goal: Dietary intake will improve Outcome: Progressing

## 2022-09-22 NOTE — Assessment & Plan Note (Signed)
 Secondary to infective endocarditis  - continue Rocephin for 6 week course as above

## 2022-09-22 NOTE — Progress Notes (Addendum)
Daily Progress Note Intern Pager: [phone]  Patient name: Christopher Farrell Medical record number: [id number] Date of birth: 1960-09-15 Age: 62 y.o. Gender: male  Primary Care Provider: Pcp, No Consultants: Radiology, CCM, ID, neurology Code Status: Full   Pt Overview and Major Events to Date:  7/1: Admission and Intubation  7/2: Brain MRI 7/4: Extubated 7/5: Transferred to FMTS  7/8: TEE 7/9: Prolonged IV Rocephin for 6 weeks total 7/10: Started Lexapro 10 mg 7/16: Brain MRI 7/17: CT head   Assessment and Plan: Christopher Farrell is a 62 y.o. 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  Not a candidate for outpatient IV antibiotics. Pertinent PMH/PSH includes HIV not on antiretrovirals.  Has had some low BPs recently. BP 102/72 on manual recheck this morning.   Patient remains clinically stable.  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 Pneumo  - Continue Rocephin for total of 6 weeks (day 15, 7/1-8/12), patient is  not a candidate for outpatient therapy due to lack of insurance and  history of IVDU.        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 No neurologic deficits compared to baseline.  Repeat MRI Brain and CT Head negative. 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  Full  range of motion  intact. No shoulder pain since. - Continue to monitor - Consider x-ray if persistent pain       FEN/GI: Regular diet PPx: SCDs, out of bed Dispo: Completion of IV antibiotic therapy (8/12).  Not a candidate for outpatient IV therapy.  Subjective:  Patient seen this morning lying in bed.  He states he is comfortable.  He reports a good appetite.  Denies dizziness, HA. He was without complaint.  Objective: Temp:  [97.6 F (36.4 C)-98.9 F (37.2 C)] 98.3 F (36.8 C) (07/18 0720) Pulse Rate:  [75-89] 81 (07/18 0720) Resp:  [16-18] 16 (07/18 0720) BP: (76-102)/(55-72) 102/72 (07/18 0814) SpO2:  [99 %-100 %] 100 % (07/18 0720) Physical Exam: General: Pleasant, no acute distress Cardiovascular: RRR Respiratory: CTA bilaterally.  No increased work of breathing.  On room air Abdomen: Soft, nontender, nondistended Extremities: Moves all extremities equally. Neuro: alert and oriented, speech normal in content.  Psych:  Alert, communicative  and cooperative.  Laboratory: Most recent CBC Lab Results  Component Value Date   WBC 8.3 09/19/2022   HGB 9.5 (L) 09/19/2022   HCT 27.7 (L) 09/19/2022   MCV 89.1 09/19/2022   PLT 384 09/19/2022   Most recent BMP    Latest Ref Rng & Units 09/21/2022    4:50 AM  BMP  Glucose  70 - 99 mg/dL 295   BUN 8 - 23 mg/dL 8   Creatinine 6.21 - 3.08 mg/dL 6.57   Sodium 846 - 962 mmol/L 134   Potassium 3.5 - 5.1 mmol/L 4.0   Chloride 98 - 111 mmol/L 97   CO2 22 - 32 mmol/L 29   Calcium 8.9 - 10.3 mg/dL 8.6     9/52/84 CT Head:  1. No evidence of acute hemorrhage. 2. Known punctate acute infarcts better characterized on recent MRI.  [name], DO 09/22/2022, 8:47 AM  PGY-1, [facility] FPTS Intern pager: [phone], text pages welcome Secure chat group [facility] Teaching Service

## 2022-09-22 NOTE — Assessment & Plan Note (Signed)
Mental status at baseline. No neurologic deficits compared to baseline. Repeat MRI Brain and CT Head negative. - Continue 6 week Rocephin treatment for endocarditis   - STAT head CT for any change in neuro exam  - BMP weekly

## 2022-09-22 NOTE — Assessment & Plan Note (Signed)
Patient complains of right shoulder pain on 7/16.  Full range of motion intact. No shoulder pain since. - Continue to monitor - Consider x-ray if persistent pain

## 2022-09-23 ENCOUNTER — Inpatient Hospital Stay: Payer: Self-pay | Admitting: Student

## 2022-09-23 DIAGNOSIS — I959 Hypotension, unspecified: Secondary | ICD-10-CM | POA: Diagnosis present

## 2022-09-23 DIAGNOSIS — B955 Unspecified streptococcus as the cause of diseases classified elsewhere: Secondary | ICD-10-CM | POA: Diagnosis present

## 2022-09-23 DIAGNOSIS — R634 Abnormal weight loss: Secondary | ICD-10-CM

## 2022-09-23 MED ORDER — MIDODRINE HCL 5 MG PO TABS: 5.0000 mg | ORAL_TABLET | Freq: Three times a day (TID) | ORAL | Status: DC

## 2022-09-23 MED ORDER — MIDODRINE HCL 5 MG PO TABS
5.0000 mg | ORAL_TABLET | Freq: Three times a day (TID) | ORAL | Status: DC
  Administered 2022-09-23 – 2022-09-25 (×5): 5 mg via ORAL
  Filled 2022-09-23 (×4): qty 1

## 2022-09-23 MED ORDER — MIDODRINE HCL 5 MG PO TABS
ORAL_TABLET | ORAL | Status: AC
  Filled 2022-09-23: qty 1

## 2022-09-23 NOTE — Assessment & Plan Note (Signed)
Mitral valve vegetation due to S. Pneumo. - Continue Rocephin for total of 6 weeks (7/1-8/12), patient is  not a candidate for outpatient therapy due to lack of insurance and  history of IVDU. - Repeat TTE 8/5, per cards recs   Meningitis due to S. Pneumo. - STAT head CT for any change in neuro exam  - BMP weekly  - Follow up with TCTS after IV abx therapy or any issues/changes to his valve

## 2022-09-23 NOTE — Assessment & Plan Note (Signed)
Patient has lost ~7 lbs during hospitalization.  - RD has seen pt, appreciate recs - daily weights - regular diet - continue Ensure 3 times daily

## 2022-09-23 NOTE — Assessment & Plan Note (Signed)
Mental status at baseline. - Continue 6 week Rocephin treatment for endocarditis   - STAT head CT for any change in neuro exam  - BMP weekly

## 2022-09-23 NOTE — Assessment & Plan Note (Signed)
Mitral valve vegetation due to S. Pneumo. - Continue Rocephin for total of 6 weeks (7/1-8/12), patient is  not a candidate for outpatient therapy due to lack of insurance and  history of IVDU.  Meningitis due to S. Pneumo. - STAT head CT for any change in neuro exam  - BMP weekly

## 2022-09-23 NOTE — Progress Notes (Addendum)
Daily Progress Note Intern Pager: [phone]  Patient name: Christopher Farrell Medical record number: [id number] Date of birth: 01-09-1961 Age: 62 y.o. Gender: male  Primary Care Provider: Pcp, No Consultants: Radiology, CCM, ID, neurology Code Status: Full   Pt Overview and Major Events to Date:  7/1: Admission and Intubation  7/2: Brain MRI 7/4: Extubated 7/5: Transferred to FMTS  7/8: TEE 7/9: Prolonged IV Rocephin for 6 weeks total 7/10: Started Lexapro 10 mg 7/16: Brain MRI 7/17: CT head   Assessment and Plan: Christopher Farrell is a 62 y.o. 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  Not a candidate for outpatient IV antibiotics. Pertinent PMH/PSH includes HIV not on antiretrovirals.   Has continued to have low BPs. BP 84/60 on manual recheck this morning.   [facility]     * (Principal) Streptococcal bacteremia     Mitral valve vegetation due to S. Pneumo. - Continue Rocephin for total of 6 weeks (7/1-8/12), patient is  not a candidate for outpatient therapy due to lack of insurance and  history of IVDU. - Repeat TTE 8/5, per cards recs   Meningitis due to S. Pneumo. - STAT head CT for any change in neuro exam  - BMP weekly  - Follow up with TCTS after IV abx therapy or any issues/changes to his  valve         Hypotension     BP consistently in the 80s/60s during hospitalization. - start midodrine 5mg          Weight loss     Patient has lost ~7 lbs during hospitalization.  - RD has seen pt, appreciate recs - daily weights - continue Ensure 3 times daily      CHRONIC, STABLE PROBLEMS: - HIV: continue Biktarvy. Recheck HIV viral load around end of July. - Right shoulder pain: tylenol prn. Consider xray if persistent  pain   FEN/GI: Regular diet PPx: SCDs, out of bed Dispo: Completion of IV antibiotic therapy (8/12).  Not a candidate for outpatient IV therapy.  Subjective:  Patient seen resting in bed this AM. He states he is comfortable and denies any concerns. No HA, chest pain, difficulty breathing, or abdominal pain. Reports a good appetite.  Objective: Temp:  [99.1 F (37.3 C)-99.2 F (37.3 C)] 99.1 F (37.3 C) (07/19 0700) Pulse Rate:  [79-89] 83 (07/19 1103) Resp:  [17-18] 18 (07/19 0700) BP: (77-84)/(53-67) 84/60 (07/19 1115) SpO2:  [99 %-100 %] 99 % (07/19 0700) Physical Exam: General: Pleasant, no acute distress Cardiovascular: RRR Respiratory: CTA bilaterally.  No increased work of breathing.  On room air. Abdomen: Normoactive bowel sounds. Soft, nontender, nondistended. Extremities: Moves all extremities equally. Neuro: alert and oriented, speech normal in content.  Psych:  Alert, communicative and cooperative.  Laboratory: Most recent CBC Lab Results  Component Value Date   WBC 8.3 09/19/2022   HGB 9.5 (L) 09/19/2022   HCT 27.7 (L) 09/19/2022   MCV 89.1 09/19/2022   PLT 384 09/19/2022   Most recent BMP    Latest Ref Rng & Units 09/21/2022    4:50 AM  BMP  Glucose 70 - 99 mg/dL 696   BUN 8 - 23 mg/dL 8   Creatinine 2.95 - 2.84 mg/dL 1.32   Sodium 440 - 102 mmol/L 134  Potassium 3.5 - 5.1 mmol/L 4.0   Chloride 98 - 111 mmol/L 97   CO2 22 - 32 mmol/L 29   Calcium 8.9 - 10.3 mg/dL 8.6     [name], DO 09/23/2022, 1:33 PM  PGY-1, [facility] FPTS Intern pager: [phone], text pages welcome Secure chat group [facility] Teaching Service

## 2022-09-23 NOTE — Assessment & Plan Note (Signed)
 Secondary to infective endocarditis  - continue Rocephin for 6 week course as above

## 2022-09-23 NOTE — Assessment & Plan Note (Signed)
>>  ASSESSMENT AND PLAN FOR WEIGHT LOSS WRITTEN ON 09/24/2022  3:09 AM BY Levin Erp, MD  Patient has lost ~7 lbs during hospitalization.  - RD has seen pt, appreciate recs - daily weights - regular diet - continue Ensure 3 times daily

## 2022-09-23 NOTE — Progress Notes (Addendum)
Physical Therapy Treatment Patient Details Name: Christopher Farrell MRN: [id number] DOB: [birth year] Today's Date: 09/23/2022   History of Present Illness Pt is a 62 y/o male presenting on 7/1 with cough and AMS. Noted presented to ED on 6/26 and found with multifocal pneumonia. Returned to ED after being found unresponsive, admitted with sepsis. 2 seizures in ED. CSF reveals bacterial meningitis. MRI with "multifocal acute/subacute ischemia within the right greater than left hemispheres and left cerebellum. Largest lesions in R parietal and occipital lobes". Intubated 7/1-7/3. PMH: HIV.    PT Comments  Requires further encouragement to participate today, mother present and supportive. Pt has bedroom shoes today. Remains agitated with prospect of working with PT. Scored 19 with DGI. Suspect we can d/c from acute services when score >19 achieved, indicating low fall risk. Would encourage more walking with staff, esp mobility techs to prevent decline or atrophy during prolonged admission. Patient will continue to benefit from skilled physical therapy services to further improve independence with functional mobility.      Assistance Recommended at Discharge Frequent or constant Supervision/Assistance  If plan is discharge home, recommend the following:  Can travel by private vehicle    Direct supervision/assist for medications management;Direct supervision/assist for financial management;Assist for transportation;A little help with walking and/or transfers;A little help with bathing/dressing/bathroom      Equipment Recommendations  None recommended by PT    Recommendations for Other Services       Precautions / Restrictions Precautions Precautions: Fall Restrictions Weight Bearing Restrictions: No     Mobility  Bed Mobility Overal bed mobility: Modified Independent             General bed mobility comments: No assist needed.    Transfers Overall transfer level: Modified  independent         Step pivot transfers: Min assist       General transfer comment: Mod I to rise from bed, declines need for AD.    Ambulation/Gait Ambulation/Gait assistance: Supervision Gait Distance (Feet): 250 Feet Assistive device: None Gait Pattern/deviations: Step-through pattern, Decreased stride length, Drifts right/left, Wide base of support Gait velocity: decreased Gait velocity interpretation: <1.8 ft/sec, indicate of risk for recurrent falls   General Gait Details: Agreeable to dynamic challenges today, participating in DGI below. Educated on findings, awareness. Additonally, pt with somewhat improved direction finding and following. Wide BOS, adjusting Lt shoe multiple times during bout. Able to self correct without assistive device.   Stairs             Wheelchair Mobility     Tilt Bed    Modified Rankin (Stroke Patients Only) Modified Rankin (Stroke Patients Only) Pre-Morbid Rankin Score: No symptoms Modified Rankin: Moderate disability     Balance Overall balance assessment: Needs assistance Sitting-balance support: Feet supported, No upper extremity supported Sitting balance-Leahy Scale: Good     Standing balance support: During functional activity, No upper extremity supported Standing balance-Leahy Scale: Good                   Standardized Balance Assessment Standardized Balance Assessment : Dynamic Gait Index   Dynamic Gait Index Level Surface: Mild Impairment Change in Gait Speed: Mild Impairment Gait with Horizontal Head Turns: Mild Impairment Gait with Vertical Head Turns: Normal Gait and Pivot Turn: Normal Step Over Obstacle: Mild Impairment Step Around Obstacles: Normal Steps: Mild Impairment Total Score: 19      Cognition Arousal/Alertness: Awake/alert Behavior During Therapy: Flat affect, Agitated Overall Cognitive Status: Impaired/Different from  baseline                                  General Comments: Following simple single step commands consistently. Correctly turns left and right. Inconsistent with multi-step commands.        Exercises      General Comments        Pertinent Vitals/Pain Pain Assessment Pain Assessment: No/denies pain Pain Score: 0-No pain Pain Intervention(s): Monitored during session    Home Living                          Prior Function            PT Goals (current goals can now be found in the care plan section) Acute Rehab PT Goals Patient Stated Goal: did not state PT Goal Formulation: With patient/family Time For Goal Achievement: 09/28/22 Potential to Achieve Goals: Good Progress towards PT goals: Progressing toward goals    Frequency    Min 3X/week      PT Plan Current plan remains appropriate    Co-evaluation              AM-PAC PT "6 Clicks" Mobility   Outcome Measure  Help needed turning from your back to your side while in a flat bed without using bedrails?: None Help needed moving from lying on your back to sitting on the side of a flat bed without using bedrails?: None Help needed moving to and from a bed to a chair (including a wheelchair)?: None Help needed standing up from a chair using your arms (e.g., wheelchair or bedside chair)?: None Help needed to walk in hospital room?: A Little Help needed climbing 3-5 steps with a railing? : A Little 6 Click Score: 22    End of Session Equipment Utilized During Treatment: Gait belt Activity Tolerance: Patient tolerated treatment well (self limiting) Patient left: with call bell/phone within reach;in bed;with bed alarm set;with family/visitor present Nurse Communication: Mobility status PT Visit Diagnosis: Unsteadiness on feet (R26.81);Other abnormalities of gait and mobility (R26.89);Muscle weakness (generalized) (M62.81);Difficulty in walking, not elsewhere classified (R26.2)     Time: 6433-2951 PT Time Calculation (min) (ACUTE ONLY):  13 min  Charges:    $Gait Training: 8-22 mins PT General Charges $$ ACUTE PT VISIT: 1 Visit                     Kathlyn Sacramento, PT, DPT [facility]  Rehabilitation Services Physical Therapist Office: [phone] Website: [email or web address]    Berton Mount 09/23/2022, 2:04 PM

## 2022-09-23 NOTE — Assessment & Plan Note (Signed)
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 Pneumo  - Continue Rocephin for total of 6 weeks (day 15, 7/1-8/12), patient is not a candidate for outpatient therapy due to lack of insurance and history of IVDU.

## 2022-09-23 NOTE — Progress Notes (Signed)
Occupational Therapy Treatment Patient Details Name: Christopher Farrell MRN: [id number] DOB: 1960/05/18 Today's Date: 09/23/2022   History of present illness Pt is a 62 y/o male presenting on 7/1 with cough and AMS. Noted presented to ED on 6/26 and found with multifocal pneumonia. Returned to ED after being found unresponsive, admitted with sepsis. 2 seizures in ED. CSF reveals bacterial meningitis. MRI with "multifocal acute/subacute ischemia within the right greater than left hemispheres and left cerebellum. Largest lesions in R parietal and occipital lobes". Intubated 7/1-7/3. PMH: HIV.   OT comments  Reluctantly agreeing to participate in session. Incorporated visual scanning into session while walking in the hall. Decreased awareness noted with possible L inattention. Pt states he feels "close to normal". 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 Will increase distractions during ADL and mobility and further assess functional vision/visual perception.    Recommendations for follow up therapy are one component of a multi-disciplinary discharge planning process, led by the attending physician.  Recommendations may be updated based on patient status, additional functional criteria and insurance authorization.    Assistance Recommended at Discharge Frequent or constant Supervision/Assistance  Patient can return home with the following  Assistance with cooking/housework;Direct supervision/assist for medications management;Direct supervision/assist for financial management;Assist for transportation   Equipment Recommendations       Recommendations for Other Services      Precautions / Restrictions Precautions Precautions: Fall Restrictions Weight Bearing Restrictions: No       Mobility Bed Mobility Overal bed mobility: Modified Independent             General bed  mobility comments: No assist needed.    Transfers Overall transfer level: Modified independent                       Balance Overall balance assessment: Needs assistance Sitting-balance support: Feet supported, No upper extremity supported Sitting balance-Leahy Scale: Good     Standing balance support: During functional activity, No upper extremity supported Standing balance-Leahy Scale: Good                             ADL either performed or assessed with clinical judgement   ADL Overall ADL's : Needs assistance/impaired Eating/Feeding: Independent   Grooming: Modified independent                               Functional mobility during ADLs: Modified independent General ADL Comments: S with ADL tasks; at baseline pt washes dishes/buses table for [employer]; walks to work    [employer] information systems officer Perception: Impaired (appears to demonstrate L inattention at times; will further assess)   Praxis      Cognition Arousal/Alertness: Awake/alert Behavior During Therapy: Flat affect Overall Cognitive Status: No family/caregiver present to determine baseline cognitive functioning                     Current Attention Level: Selective   Following Commands: Follows one step commands consistently Safety/Judgement: Decreased awareness of safety, Decreased awareness of deficits Awareness: Emergent Problem Solving: Slow processing General Comments: decreased awareness of deficits; unsure of why he is in  the hospital        Exercises Exercises:  (Declines)    Shoulder Instructions       General Comments Ambulated to the nurses station to get 3 objects which he remembered adn then had appropirate converstion with NT secretary however did not sustian conversation with staff    Pertinent Vitals/ Pain       Pain Assessment Pain Assessment: No/denies pain  Home  Living                                          Prior Functioning/Environment              Frequency  Min 1X/week        Progress Toward Goals  OT Goals(current goals can now be found in the care plan section)  Progress towards OT goals: Goals met and updated - see care plan  Acute Rehab OT Goals Patient Stated Goal: to get some crackers OT Goal Formulation: With patient Time For Goal Achievement: 10/07/22 Potential to Achieve Goals: Good ADL Goals Pt Will Perform Grooming:  (goal met 7/19) Pt Will Perform Upper Body Dressing:  (goal met 7/19) Additional ADL Goal #2:  (goal met 7/19)  Plan Discharge plan remains appropriate    Co-evaluation                 AM-PAC OT "6 Clicks" Daily Activity     Outcome Measure   Help from another person eating meals?: None Help from another person taking care of personal grooming?: None Help from another person toileting, which includes using toliet, bedpan, or urinal?: A Little Help from another person bathing (including washing, rinsing, drying)?: A Little Help from another person to put on and taking off regular upper body clothing?: A Little Help from another person to put on and taking off regular lower body clothing?: A Little 6 Click Score: 20    End of Session Equipment Utilized During Treatment: Gait belt  OT Visit Diagnosis: Other abnormalities of gait and mobility (R26.89);Muscle weakness (generalized) (M62.81);Pain;Other symptoms and signs involving cognitive function;Cognitive communication deficit (R41.841);Other symptoms and signs involving the nervous system (R29.898)   Activity Tolerance Patient tolerated treatment well   Patient Left in bed;with call bell/phone within reach;with bed alarm set   Nurse Communication Mobility status        Time: 8413-2440 OT Time Calculation (min): 25 min  Charges: OT General Charges $OT Visit: 1 Visit OT Treatments $Self Care/Home Management :  23-37 mins  Luisa Dago, OT/L   Acute OT Clinical Specialist Acute Rehabilitation Services Pager [phone] Office [phone]   [facility] 09/23/2022, 4:51 PM

## 2022-09-23 NOTE — Assessment & Plan Note (Signed)
Patient complains of right shoulder pain on 7/16.  Full range of motion intact. No shoulder pain since. - Continue to monitor - Consider x-ray if persistent pain

## 2022-09-23 NOTE — Assessment & Plan Note (Signed)
Continue biktarvy 

## 2022-09-23 NOTE — Assessment & Plan Note (Signed)
BP consistently in the 80s/60s during hospitalization. - start midodrine 5mg 

## 2022-09-24 NOTE — Progress Notes (Signed)
Daily Progress Note Intern Pager: [phone]  Patient name: Christopher Farrell Medical record number: [id number] Date of birth: 10-Jun-1960 Age: 62 y.o. Gender: male  Primary Care Provider: Pcp, No Consultants: IR, PCCM, ID, Neurology (all signed off) Code Status: FULL  Pt Overview and Major Events to Date:  7/1: Admission and Intubation  7/2: Brain MRI-multifocal acute ischemia in R>L cerebral hemispheres and cerebellum 7/4: Extubated 7/5: Transferred to FMTS  7/8: TEE-MV multilobular vegetation 7/9: Prolonged IV Rocephin for 6 weeks total 7/10: Started Lexapro 10 mg 7/16: Repeat Brain MRI - similar artifact present need CT Head to r/o hemorrhage 7/17: CT head negative   Assessment and Plan:  Christopher Farrell is a 62 y.o. 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 Not a candidate for outpatient IV antibiotics. Pertinent PMH/PSH includes HIV not on antiretrovirals.  [facility]     * (Principal) Streptococcal bacteremia     Mitral valve vegetation due to S. Pneumo. - Continue Rocephin for total of 6 weeks (7/1-8/12), patient is  not a candidate for outpatient therapy due to lack of insurance and  history of IVDU. - Repeat TTE 8/5, per cards recs   Meningitis due to S. Pneumo. - STAT head CT for any change in neuro exam  - BMP weekly  - Follow up with TCTS after IV abx therapy or any issues/changes to his  valve         Hypotension     BP consistently in the 80s/60s during hospitalization. - continue midodrine 5mg  TID        Weight loss     Patient has lost ~7 lbs during hospitalization.  - RD has seen pt, appreciate recs - daily weights - regular diet - continue Ensure 3 times daily       CHRONIC, STABLE PROBLEMS: - HIV:  continue Biktarvy. Recheck HIV viral load around end of July. - Right shoulder pain: tylenol prn. Consider xray if persistent pain    FEN/GI: Regular PPx: SCDs Dispo: Completing IV Abx 8/12 inpatient as not a candidate for outpatient IV therapy  Subjective:  Denies any issues overnight.  States that not have any dizziness, numbness, weakness.  No fever or chills.  Objective: Temp:  [98.3 F (36.8 C)-99.1 F (37.3 C)] 98.3 F (36.8 C) (07/19 2308) Pulse Rate:  [78-86] 78 (07/19 2308) Resp:  [17-18] 17 (07/19 2308) BP: (79-92)/(60-74) 84/74 (07/19 2308) SpO2:  [99 %-100 %] 100 % (07/19 2308) Physical Exam: General: NAD, awake, alert, responsive to all questions Cardiovascular: Regular rate, systolic murmur appreciated, no rubs or gallops Respiratory: Clear to auscultation bilaterally, no wheezes rales or crackles Abdomen: Soft, nontender to palpation Extremities: Well-perfused, no lower extremity edema  Laboratory: Most recent CBC Lab Results  Component Value Date   WBC 8.3 09/19/2022   HGB 9.5 (L) 09/19/2022   HCT 27.7 (L) 09/19/2022   MCV 89.1 09/19/2022   PLT 384 09/19/2022   Most recent BMP    Latest Ref Rng & Units 09/21/2022    4:50 AM  BMP  Glucose 70 - 99 mg/dL 324   BUN 8 - 23 mg/dL 8   Creatinine 4.01 - 0.27 mg/dL 2.53   Sodium 664 - 403 mmol/L 134   Potassium 3.5 - 5.1 mmol/L 4.0  Chloride 98 - 111 mmol/L 97   CO2 22 - 32 mmol/L 29   Calcium 8.9 - 10.3 mg/dL 8.6      Levin Erp, MD 09/24/2022, 3:10 AM  PGY-3, [facility] Family Medicine FPTS Intern pager: [phone], text pages welcome Secure chat group [facility] Teaching Service

## 2022-09-24 NOTE — Assessment & Plan Note (Signed)
BP consistently in the 80s/60s during hospitalization. - continue midodrine 5mg  TID

## 2022-09-25 LAB — CBC
HCT: 30.1 % — ABNORMAL LOW (ref 39.0–52.0)
Hemoglobin: 9.7 g/dL — ABNORMAL LOW (ref 13.0–17.0)
MCH: 29.4 pg (ref 26.0–34.0)
MCHC: 32.2 g/dL (ref 30.0–36.0)
MCV: 91.2 fL (ref 80.0–100.0)
Platelets: 270 10*3/uL (ref 150–400)
RBC: 3.3 MIL/uL — ABNORMAL LOW (ref 4.22–5.81)
RDW: 15.7 % — ABNORMAL HIGH (ref 11.5–15.5)
WBC: 9 10*3/uL (ref 4.0–10.5)
nRBC: 0 % (ref 0.0–0.2)

## 2022-09-25 MED ORDER — MIDODRINE HCL 5 MG PO TABS
10.0000 mg | ORAL_TABLET | Freq: Three times a day (TID) | ORAL | Status: DC
  Administered 2022-09-25 – 2022-10-11 (×49): 10 mg via ORAL
  Filled 2022-09-25 (×51): qty 2

## 2022-09-25 MED ORDER — LOPERAMIDE HCL 2 MG PO CAPS
2.0000 mg | ORAL_CAPSULE | ORAL | Status: DC | PRN
  Administered 2022-09-26 – 2022-10-11 (×8): 2 mg via ORAL
  Filled 2022-09-25 (×10): qty 1

## 2022-09-25 NOTE — Assessment & Plan Note (Signed)
BP consistently in the 80s/60s during hospitalization. -Continue midodrine to 10mg  TID

## 2022-09-25 NOTE — Assessment & Plan Note (Signed)
BP consistently in the 80s/60s during hospitalization. - continue midodrine 5mg  TID

## 2022-09-25 NOTE — Assessment & Plan Note (Signed)
Patient has lost ~7 lbs during hospitalization. Most recent weight yesterday 108lbs, consistent with weight 2 weeks ago. - RD has seen pt, appreciate recs - daily weights - regular diet - continue Ensure 3 times daily

## 2022-09-25 NOTE — Assessment & Plan Note (Signed)
>>  ASSESSMENT AND PLAN FOR WEIGHT LOSS WRITTEN ON 09/25/2022  9:10 AM BY Ason Heslin, DO  Patient has lost ~7 lbs during hospitalization. Most recent weight yesterday 108lbs, consistent with weight 2 weeks ago. - RD has seen pt, appreciate recs - daily weights - regular diet - continue Ensure 3 times daily

## 2022-09-25 NOTE — Plan of Care (Signed)
  Problem: Coping: Goal: Ability to adjust to condition or change in health will improve Outcome: Progressing   Problem: Fluid Volume: Goal: Ability to maintain a balanced intake and output will improve Outcome: Progressing   Problem: Clinical Measurements: Goal: Respiratory complications will improve Outcome: Progressing   Problem: Coping: Goal: Level of anxiety will decrease Outcome: Progressing   Problem: Elimination: Goal: Will not experience complications related to urinary retention Outcome: Progressing   Problem: Skin Integrity: Goal: Risk for impaired skin integrity will decrease Outcome: Not Progressing   Problem: Elimination: Goal: Will not experience complications related to bowel motility Outcome: Not Progressing

## 2022-09-25 NOTE — Assessment & Plan Note (Signed)
Mitral valve vegetation due to S. Pneumo. - Continue Rocephin for total of 6 weeks (7/1-8/12), patient is  not a candidate for outpatient therapy due to lack of insurance and  history of IVDU. - Repeat TTE 8/5, per cards recs   Meningitis due to S. Pneumo. - STAT head CT for any change in neuro exam  - BMP weekly  - Follow up with TCTS after IV abx therapy or any issues/changes to his valve

## 2022-09-25 NOTE — Progress Notes (Addendum)
Daily Progress Note Intern Pager: [phone]  Patient name: Christopher Farrell Medical record number: [id number] Date of birth: December 10, 1960 Age: 62 y.o. Gender: male  Primary Care Provider: Pcp, No Consultants: Radiology, CCM, ID, neurology Code Status: Full   Pt Overview and Major Events to Date:  7/1: Admission and Intubation  7/2: Brain MRI - multifocal acute ischemia in R>L cerebral hemispheres and cerebellum  7/4: Extubated 7/5: Transferred to FMTS  7/8: TEE - MV multilobular vegetation  7/9: Prolonged IV Rocephin for 6 weeks total 7/10: Started Lexapro 10 mg 7/16: Brain MRI - similar artifact present need CT Head to r/o hemorrhage  7/17: CT head negative 7/19: repeated low BPs, started midodrine 5mg    Assessment and Plan: Christopher Farrell is a 62 y.o. 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  Not a candidate for outpatient IV antibiotics. Pertinent PMH/PSH includes HIV not on antiretrovirals.  BPs continue to be somewhat low, consistent with history during the course of his hospitalization.  Patient denies dizziness or lightheadedness, reports he is able to move around his room freely and work with PT without concern.   Patient remains clinically stable.   [facility]     * (Principal) Streptococcal bacteremia     Mitral valve vegetation due to S. Pneumo. - Continue Rocephin for total of 6 weeks (7/1-8/12), patient is  not a candidate for outpatient therapy due to lack of insurance and  history of IVDU. - Repeat TTE 8/5, per cards recs   Meningitis due to S. Pneumo. - STAT head CT for any change in neuro exam  - BMP weekly  - Follow up with TCTS after IV abx therapy or any issues/changes to his  valve         Hypotension     BP  consistently in the 80s/60s during hospitalization. - increase midodrine to 10mg  TID        Weight loss     Patient has lost ~7 lbs during hospitalization. Most recent weight  yesterday 108lbs, consistent with weight 2 weeks ago. - RD has seen pt, appreciate recs - daily weights - regular diet - continue Ensure 3 times daily        FEN/GI: Regular diet PPx: SCDs, out of bed Dispo: Completing IV Abx 8/12 inpatient as not a candidate for outpatient IV therapy  Subjective:  Patient seen sitting up in bed this morning eating breakfast.  He has 2 Ensure drinks at bedside, and has finished one of them.  He denies any headache, dizziness, lightheadedness, abdominal pain.  He reports his appetite is "fine."  He does complain of right shoulder pain and stiffness.  This has been a problem he previously brought up during this hospitalization, though it resolved on its own.  He states he has been getting up and walking around the room and participating with PT.  Objective: Temp:  [98.3 F (36.8 C)-99 F (37.2 C)] 98.3 F (36.8 C) (07/21 0719) Pulse Rate:  [78-86] 86 (07/21 0719) Resp:  [14-17] 14 (07/21 0719) BP: (77-91)/(54-71) 77/56 (07/21 0719) SpO2:  [99 %-100 %] 100 % (07/21 0324) Physical Exam: General: No acute distress, pleasant, responsive to all questions. Cardiovascular: RRR Respiratory: CTA bilaterally.  No increased work of breathing.  On room air. Abdomen: Normoactive  bowel sounds.  Soft, nontender, nondistended. Extremities: Moves all extremities equally.  Normal range of motion in right shoulder, with some discomfort.  Laboratory: Most recent CBC Lab Results  Component Value Date   WBC 8.3 09/19/2022   HGB 9.5 (L) 09/19/2022   HCT 27.7 (L) 09/19/2022   MCV 89.1 09/19/2022   PLT 384 09/19/2022   Most recent BMP    Latest Ref Rng & Units 09/21/2022    4:50 AM  BMP  Glucose 70 - 99 mg/dL 440   BUN 8 - 23 mg/dL 8   Creatinine 1.02 - 7.25 mg/dL 3.66   Sodium 440  - 347 mmol/L 134   Potassium 3.5 - 5.1 mmol/L 4.0   Chloride 98 - 111 mmol/L 97   CO2 22 - 32 mmol/L 29   Calcium 8.9 - 10.3 mg/dL 8.6     [name], DO 09/25/2022, 11:52 AM  PGY-1, [facility] FPTS Intern pager: [phone], text pages welcome Secure chat group [facility] Teaching Service

## 2022-09-26 DIAGNOSIS — R634 Abnormal weight loss: Secondary | ICD-10-CM

## 2022-09-26 DIAGNOSIS — R401 Stupor: Secondary | ICD-10-CM | POA: Insufficient documentation

## 2022-09-26 DIAGNOSIS — Z91199 Patient's noncompliance with other medical treatment and regimen due to unspecified reason: Secondary | ICD-10-CM

## 2022-09-26 DIAGNOSIS — J13 Pneumonia due to Streptococcus pneumoniae: Secondary | ICD-10-CM

## 2022-09-26 LAB — CULTURE, FUNGUS WITHOUT SMEAR

## 2022-09-26 MED ORDER — LIDOCAINE 5 % EX PTCH
1.0000 | MEDICATED_PATCH | Freq: Every day | CUTANEOUS | Status: DC
  Administered 2022-09-26 – 2022-10-09 (×11): 1 via TRANSDERMAL
  Filled 2022-09-26 (×18): qty 1

## 2022-09-26 MED ORDER — DICLOFENAC SODIUM 1 % EX GEL
4.0000 g | Freq: Four times a day (QID) | CUTANEOUS | Status: DC
  Administered 2022-09-26 – 2022-10-13 (×46): 4 g via TOPICAL
  Filled 2022-09-26: qty 100

## 2022-09-26 NOTE — Assessment & Plan Note (Signed)
Patient has lost ~7 lbs during hospitalization. Most recent weight yesterday 108lbs, consistent with weight 2 weeks ago. - RD has seen pt, appreciate recs - daily weights - regular diet - continue Ensure 3 times daily

## 2022-09-26 NOTE — Progress Notes (Signed)
Patient transferred to 5C16, per order via wheelchair escorted by CNA. All personal belongings sent with patient. VSS. Respirations are even and unlabored. NAD. RA. Writer left voicemail for patient's mother Thorsten Climer [phone] to notify of the transfer per patient request.

## 2022-09-26 NOTE — Progress Notes (Addendum)
Daily Progress Note Intern Pager: [phone]  Patient name: Christopher Farrell Medical record number: [id number] Date of birth: 17-Dec-1960 Age: 62 y.o. Gender: male  Primary Care Provider: Pcp, No Consultants: Radiology, CCM, ID, neurology Code Status: Full  Pt Overview and Major Events to Date:  7/1: Admission and Intubation  7/2: Brain MRI - multifocal acute ischemia in R>L cerebral hemispheres and cerebellum  7/4: Extubated 7/5: Transferred to FMTS  7/8: TEE - MV multilobular vegetation  7/9: Prolonged IV Rocephin for 6 weeks total 7/10: Started Lexapro 10 mg 7/16: Brain MRI - similar artifact present need CT Head to r/o hemorrhage  7/17: CT head negative 7/19: repeated low BPs, started midodrine 5mg  7/21: Increased midodrine to 10 mg  Assessment and Plan: JSAON YOO is a 62 y.o. 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  Not a candidate for outpatient IV antibiotics. Pertinent PMH/PSH includes HIV not on antiretrovirals.   Patient remains clinically stable.  [facility]     * (Principal) Streptococcal bacteremia     Mitral valve vegetation due to S. Pneumo. - Continue Rocephin for total of 6 weeks (7/1-8/12), patient is  not a candidate for outpatient therapy due to lack of insurance and  history of IVDU. - Repeat TTE 8/5, per cards recs   Meningitis due to S. Pneumo. - STAT head CT for any change in neuro exam  - BMP weekly  - Follow up with TCTS after IV abx therapy or any issues/changes to his  valve         Hypotension     BP consistently in the 80s/60s during hospitalization. -Continue midodrine to 10mg  TID        Weight loss     Patient has lost ~7 lbs during hospitalization. Most recent weight  yesterday 108lbs,  consistent with weight 2 weeks ago. - RD has seen pt, appreciate recs - daily weights - regular diet - continue Ensure 3 times daily         Stupor  Chronic, stable problems: Diarrhea -Imodium as needed Right shoulder pain/stiffness - pt has voltaren gel, lidocaine patch, heating pad as needed  FEN/GI: Regular diet PPx: SCDs, out of bed Dispo: Pending inpatient completion of IV antibiotics on 8/12.  Not a candidate for outpatient IV therapy.  Subjective:  Seen resting in bed this morning and with a tray of breakfast.  He reports he is doing "fine."  When I asked about the diarrhea reported yesterday by his RN, he states he has had some issues, does not elaborate.  He says he has been trying to work with PT, but he only has slippers here in the hospital and feels like he will fall down without different shoes.  He reports some ongoing right shoulder pain, no worse than prior.  He states he does continue to work on this with PT. Provided work note per pt request.  Objective: Temp:  [97.8 F (36.6 C)-99.1 F (37.3 C)] 99.1 F (37.3 C) (07/22 1110) Pulse Rate:  [76-84] 78 (07/22 1110) Resp:  [14-19] 16 (07/22 1110) BP: (83-94)/(57-70) 86/60 (07/22 1110) SpO2:  [100 %] 100 % (07/22 1110) Physical Exam: General: No acute distress Cardiovascular: RRR Respiratory: CTA bilaterally, on room air Abdomen: Active bowel sounds.  Soft, nontender, nondistended.  Extremities: Extremities equally.  Full range of motion in right shoulder. Neuro: Alert, speech normal in content. Psych: Communicative and cooperative.  Laboratory: Most recent CBC Lab Results  Component Value Date   WBC 9.0 09/25/2022   HGB 9.7 (L) 09/25/2022   HCT 30.1 (L) 09/25/2022   MCV 91.2 09/25/2022   PLT 270 09/25/2022   Most recent BMP    Latest Ref Rng & Units 09/21/2022    4:50 AM  BMP  Glucose 70 - 99 mg/dL 161   BUN 8 - 23 mg/dL 8   Creatinine 0.96 - 0.45 mg/dL 4.09   Sodium 811 - 914 mmol/L 134    Potassium 3.5 - 5.1 mmol/L 4.0   Chloride 98 - 111 mmol/L 97   CO2 22 - 32 mmol/L 29   Calcium 8.9 - 10.3 mg/dL 8.6     [name], DO 09/26/2022, 1:27 PM  PGY-1, [facility] Family Medicine FPTS Intern pager: [phone], text pages welcome Secure chat group [facility] Teaching Service

## 2022-09-26 NOTE — Assessment & Plan Note (Signed)
>>  ASSESSMENT AND PLAN FOR WEIGHT LOSS WRITTEN ON 09/26/2022  9:38 AM BY Takela Varden, DO  Patient has lost ~7 lbs during hospitalization. Most recent weight yesterday 108lbs, consistent with weight 2 weeks ago. - RD has seen pt, appreciate recs - daily weights - regular diet - continue Ensure 3 times daily

## 2022-09-26 NOTE — Progress Notes (Signed)
PT Cancellation Note  Patient Details Name: Christopher Farrell MRN: [id number] DOB: 1960-04-03   Cancelled Treatment:    Reason Eval/Treat Not Completed: Patient declined, no reason specified  States he does not feel like working with Physical Therapy today. May agree on another date. Educated on importance of safe mobility and progressing independence during admission to maximize outcomes. Will follow and work with pt as willing.  Kathlyn Sacramento, PT, DPT [facility] Health  Rehabilitation Services Physical Therapist Office: [phone] Website: [email or web address]  Berton Mount 09/26/2022, 1:43 PM

## 2022-09-26 NOTE — Assessment & Plan Note (Signed)
Mitral valve vegetation due to S. Pneumo. - Continue Rocephin for total of 6 weeks (7/1-8/12), patient is  not a candidate for outpatient therapy due to lack of insurance and  history of IVDU. - Repeat TTE 8/5, per cards recs   Meningitis due to S. Pneumo. - STAT head CT for any change in neuro exam  - BMP weekly  - Follow up with TCTS after IV abx therapy or any issues/changes to his valve

## 2022-09-26 NOTE — Progress Notes (Signed)
[facility]   Reason for visit: Follow up on bacteremia/mv endocarditis/pna from strep pna  Interval History:  repeat blood cultures from 7/2 have remained negative.  No new complaints.  No focal neurological deficit  Patient finished 3 weeks bid ceftriaxone today and will need 3 more weeks daily dosing for endocarditis  No complaint  3-4 watery diarrhea a day no n/v/abd pain. Good appetite. No leukocytosis   Physical Exam: Constitutional:  Vitals:   09/26/22 1110 09/26/22 1340  BP: (!) 86/60 (!) 89/68  Pulse: 78 83  Resp: 16 18  Temp: 99.1 F (37.3 C) 98 F (36.7 C)  SpO2: 100% 99%    General/constitutional: no distress, pleasant HEENT: Normocephalic, PER, Conj Clear, EOMI, Oropharynx clear Neck supple CV: rrr no mrg Lungs: clear to auscultation, normal respiratory effort Abd: Soft, Nontender Ext: no edema Skin: No Rash Neuro: nonfocal MSK: no peripheral joint swelling/tenderness/warmth; back spines nontender    Review of Systems: Constitutional: negative for fevers and chills  Lab Results  Component Value Date   WBC 9.0 09/25/2022   HGB 9.7 (L) 09/25/2022   HCT 30.1 (L) 09/25/2022   MCV 91.2 09/25/2022   PLT 270 09/25/2022    Lab Results  Component Value Date   CREATININE 0.61 09/21/2022   BUN 8 09/21/2022   NA 134 (L) 09/21/2022   K 4.0 09/21/2022   CL 97 (L) 09/21/2022   CO2 29 09/21/2022    Lab Results  Component Value Date   ALT 36 09/05/2022   AST 94 (H) 09/05/2022   ALKPHOS 42 09/05/2022     Microbiology: No results found for this or any previous visit (from the past 240 hour(s)).  Imaging: Reviewed   7/1 cta chest/abd pelv 1. Negative examination for pulmonary embolism. 2. Dense, heterogeneous nodular and consolidative airspace opacity throughout the right lung, particularly in the inferior right upper lobe, although also seen in the lateral segment right middle lobe. Findings are consistent with  multifocal infection. 3. Diffuse bilateral bronchial wall thickening and extensive bronchiolar plugging, consistent with nonspecific infectious or inflammatory bronchitis. 4. Emphysema. 5. Coronary artery disease.   7/8 tee  1. Left ventricular ejection fraction, by estimation, is 65 to 70%. The left ventricle has normal function. The left ventricle has no regional wall motion abnormalities.   2. Right ventricular systolic function is normal. The right ventricular size is normal.   3. No left atrial/left atrial appendage thrombus was detected.   4. There is a multilobular vegetation attached to the anterior mitral valve leaflet that measures approximately 1.7x 0.85cm. The vegetation is broad based and mostly fixed, but has small mobile components. 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 There is severe mitral insufficiency directed laterally, towards the ostium of the left atrial appendage. By the PISA method, the ERO area is 0.8 cm sq, regurgitant volume 100 ml. The mitral valve is abnormal. Severe mitral valve regurgitation. No evidence of mitral stenosis.   5. The tricuspid valve is abnormal.   6. The aortic valve is tricuspid. Aortic valve regurgitation is not  visualized. No aortic stenosis is present.    7/16 mri brain 1. Punctate foci of acute ischemia within the left corona radiata and right occipital lobe. No hemorrhage or mass effect. 2. Area of abnormal DWI signal within the extra-axial space overlying both convexities, left-greater-than-right. No corresponding abnormality on the FLAIR sequence. This may be artifactual, but correlation with noncontrast head CT may be  helpful to exclude hemorrhage. 3. 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    --------- Abx: 7/1-c ceftriaxone; 7/22 dosing bid decreased to daily  Outpatient biktegravir   Line: 7/5-c rue picc   A/p 62 yo male  with hiv/aids, intermittent compliance with ART admitted 7/1 with sepsis in setting [place] syndrome from strep pneumoniae infection   #strep pnumoniae bacteremia #austrian syndrome (meningitis, IE, pneumonia) 7/01 csf cx strep pnae 7/01 bcx strep pnae 7/01 me pcr strep pnae 7/08 tee severe mr. I discussed with primary team who had spoken to cards/cts --> repeat tee around 4 weeks of abx to reassess 7/22 patient without sign/sx of chf or further embolic phenomenom. No headache/neurological deficit/back-periphearl joint pain  -continue ceftriaxone for another 3 weeks from 7/22    #hiv/aids Lab Results  Component Value Date   HIV1RNAQUANT 102,000 09/05/2022   Lab Results  Component Value Date   CD4TCELL 13 (L) 09/05/2022   CD4TABS 216 (L) 09/05/2022   Intermittently compliant. Undetectable late 2022 then lost to follow up and off ART prior to this admission Restarted on biktarvy No strong indication for pjp prophylaxis at this time    I spent more than 50 minute reviewing data/chart, and coordinating care, providing direct face to face time providing counseling/discussing diagnostics/treatment plan with patient and treatment team

## 2022-09-26 NOTE — Progress Notes (Signed)
Report called to Maisie Fus, RN on [facility] assuming care of this patient upon transfer to [facility] room 16. Writer answered all questions.

## 2022-09-26 NOTE — Plan of Care (Signed)
Patient alert/oriented X4. Patient compliant with medication administration and blood pressure was soft on admission. MD aware. Patient skin assessed with Maxie Better, RN. Generalized scabbing noted. Patient belongings are packed up at bedside. Will continue to monitor.   Problem: Education: Goal: Ability to describe self-care measures that may prevent or decrease complications (Diabetes Survival Skills Education) will improve Outcome: Progressing   Problem: Education: Goal: Individualized Educational Video(s) Outcome: Progressing   Problem: Coping: Goal: Ability to adjust to condition or change in health will improve Outcome: Progressing   Problem: Fluid Volume: Goal: Ability to maintain a balanced intake and output will improve Outcome: Progressing   Problem: Health Behavior/Discharge Planning: Goal: Ability to identify and utilize available resources and services will improve Outcome: Progressing   Problem: Health Behavior/Discharge Planning: Goal: Ability to manage health-related needs will improve Outcome: Progressing   Problem: Metabolic: Goal: Ability to maintain appropriate glucose levels will improve Outcome: Progressing   Problem: Nutritional: Goal: Maintenance of adequate nutrition will improve Outcome: Progressing   Problem: Nutritional: Goal: Progress toward achieving an optimal weight will improve Outcome: Progressing   Problem: Nutritional: Goal: Progress toward achieving an optimal weight will improve Outcome: Progressing   Problem: Skin Integrity: Goal: Risk for impaired skin integrity will decrease Outcome: Progressing   Problem: Tissue Perfusion: Goal: Adequacy of tissue perfusion will improve Outcome: Progressing   Problem: Education: Goal: Knowledge of General Education information will improve Description: Including pain rating scale, medication(s)/side effects and non-pharmacologic comfort measures Outcome: Progressing   Problem: Health  Behavior/Discharge Planning: Goal: Ability to manage health-related needs will improve Outcome: Progressing   Problem: Clinical Measurements: Goal: Ability to maintain clinical measurements within normal limits will improve Outcome: Progressing   Problem: Clinical Measurements: Goal: Will remain free from infection Outcome: Progressing   Problem: Clinical Measurements: Goal: Diagnostic test results will improve Outcome: Progressing   Problem: Clinical Measurements: Goal: Respiratory complications will improve Outcome: Progressing   Problem: Clinical Measurements: Goal: Cardiovascular complication will be avoided Outcome: Progressing   Problem: Activity: Goal: Risk for activity intolerance will decrease Outcome: Progressing   Problem: Nutrition: Goal: Adequate nutrition will be maintained Outcome: Progressing   Problem: Coping: Goal: Level of anxiety will decrease Outcome: Progressing   Problem: Elimination: Goal: Will not experience complications related to bowel motility Outcome: Progressing   Problem: Elimination: Goal: Will not experience complications related to urinary retention Outcome: Progressing   Problem: Safety: Goal: Ability to remain free from injury will improve Outcome: Progressing   Problem: Skin Integrity: Goal: Risk for impaired skin integrity will decrease Outcome: Progressing   Problem: Education: Goal: Knowledge of disease or condition will improve Outcome: Progressing   Problem: Education: Goal: Knowledge of secondary prevention will improve (MUST DOCUMENT ALL) Outcome: Progressing   Problem: Education: Goal: Knowledge of patient specific risk factors will improve Loraine Leriche N/A or DELETE if not current risk factor) Outcome: Progressing   Problem: Ischemic Stroke/TIA Tissue Perfusion: Goal: Complications of ischemic stroke/TIA will be minimized Outcome: Progressing   Problem: Coping: Goal: Will verbalize positive feelings about  self Outcome: Progressing   Problem: Coping: Goal: Will identify appropriate support needs Outcome: Progressing   Problem: Health Behavior/Discharge Planning: Goal: Ability to manage health-related needs will improve Outcome: Progressing   Problem: Self-Care: Goal: Ability to participate in self-care as condition permits will improve Outcome: Progressing   Problem: Self-Care: Goal: Verbalization of feelings and concerns over difficulty with self-care will improve Outcome: Progressing   Problem: Self-Care: Goal: Ability to communicate needs accurately will improve Outcome: Progressing  Problem: Nutrition: Goal: Risk of aspiration will decrease Outcome: Progressing   Problem: Nutrition: Goal: Dietary intake will improve Outcome: Progressing

## 2022-09-27 NOTE — Plan of Care (Signed)
  Problem: Coping: Goal: Ability to adjust to condition or change in health will improve Outcome: Progressing   Problem: Nutritional: Goal: Maintenance of adequate nutrition will improve Outcome: Progressing Goal: Progress toward achieving an optimal weight will improve Outcome: Progressing   Problem: Skin Integrity: Goal: Risk for impaired skin integrity will decrease Outcome: Progressing

## 2022-09-27 NOTE — Plan of Care (Signed)
A/ox4 and on room air. No complaints of pain this shift. Up with standby assist. Patient compliant with using call bell before getting out of bed after family left. BP remains soft. No acute issues.    Problem: Education: Goal: Ability to describe self-care measures that may prevent or decrease complications (Diabetes Survival Skills Education) will improve Outcome: Progressing Goal: Individualized Educational Video(s) Outcome: Progressing   Problem: Coping: Goal: Ability to adjust to condition or change in health will improve Outcome: Progressing   Problem: Fluid Volume: Goal: Ability to maintain a balanced intake and output will improve Outcome: Progressing   Problem: Health Behavior/Discharge Planning: Goal: Ability to identify and utilize available resources and services will improve Outcome: Progressing Goal: Ability to manage health-related needs will improve Outcome: Progressing   Problem: Metabolic: Goal: Ability to maintain appropriate glucose levels will improve Outcome: Progressing   Problem: Nutritional: Goal: Maintenance of adequate nutrition will improve Outcome: Progressing Goal: Progress toward achieving an optimal weight will improve Outcome: Progressing   Problem: Skin Integrity: Goal: Risk for impaired skin integrity will decrease Outcome: Progressing   Problem: Tissue Perfusion: Goal: Adequacy of tissue perfusion will improve Outcome: Progressing   Problem: Education: Goal: Knowledge of General Education information will improve Description: Including pain rating scale, medication(s)/side effects and non-pharmacologic comfort measures Outcome: Progressing   Problem: Health Behavior/Discharge Planning: Goal: Ability to manage health-related needs will improve Outcome: Progressing   Problem: Clinical Measurements: Goal: Ability to maintain clinical measurements within normal limits will improve Outcome: Progressing Goal: Will remain free from  infection Outcome: Progressing Goal: Diagnostic test results will improve Outcome: Progressing Goal: Respiratory complications will improve Outcome: Progressing Goal: Cardiovascular complication will be avoided Outcome: Progressing   Problem: Activity: Goal: Risk for activity intolerance will decrease Outcome: Progressing   Problem: Nutrition: Goal: Adequate nutrition will be maintained Outcome: Progressing   Problem: Coping: Goal: Level of anxiety will decrease Outcome: Progressing   Problem: Elimination: Goal: Will not experience complications related to bowel motility Outcome: Progressing Goal: Will not experience complications related to urinary retention Outcome: Progressing   Problem: Safety: Goal: Ability to remain free from injury will improve Outcome: Progressing   Problem: Skin Integrity: Goal: Risk for impaired skin integrity will decrease Outcome: Progressing   Problem: Education: Goal: Knowledge of disease or condition will improve Outcome: Progressing Goal: Knowledge of secondary prevention will improve (MUST DOCUMENT ALL) Outcome: Progressing Goal: Knowledge of patient specific risk factors will improve Loraine Leriche N/A or DELETE if not current risk factor) Outcome: Progressing   Problem: Ischemic Stroke/TIA Tissue Perfusion: Goal: Complications of ischemic stroke/TIA will be minimized Outcome: Progressing   Problem: Coping: Goal: Will verbalize positive feelings about self Outcome: Progressing Goal: Will identify appropriate support needs Outcome: Progressing   Problem: Health Behavior/Discharge Planning: Goal: Ability to manage health-related needs will improve Outcome: Progressing Goal: Goals will be collaboratively established with patient/family Outcome: Progressing   Problem: Self-Care: Goal: Ability to participate in self-care as condition permits will improve Outcome: Progressing Goal: Verbalization of feelings and concerns over difficulty  with self-care will improve Outcome: Progressing Goal: Ability to communicate needs accurately will improve Outcome: Progressing   Problem: Nutrition: Goal: Risk of aspiration will decrease Outcome: Progressing Goal: Dietary intake will improve Outcome: Progressing

## 2022-09-27 NOTE — Assessment & Plan Note (Addendum)
BP consistently in the 80s/60s during hospitalization. -Continue midodrine 10mg  TID

## 2022-09-27 NOTE — Progress Notes (Addendum)
Daily Progress Note Intern Pager: [phone]  Patient name: Christopher Farrell Medical record number: [id number] Date of birth: [birth year] Age: 62 y.o. Gender: male  Primary Care Provider: Pcp, No Consultants: Radiology, CCM, ID, neurology Code Status: Full   Pt Overview and Major Events to Date:  7/1: Admission and Intubation  7/2: Brain MRI - multifocal acute ischemia in R>L cerebral hemispheres and cerebellum  7/4: Extubated 7/5: Transferred to [facility]  7/8: TEE - MV multilobular vegetation  7/9: Prolonged IV Rocephin for 6 weeks total 7/10: Started Lexapro 10 mg 7/16: Brain MRI - similar artifact present need CT Head to r/o hemorrhage  7/17: CT head negative 7/19: repeated low BPs, started midodrine 5mg  7/21: Increased midodrine to 10 mg   Assessment and Plan: Christopher Farrell is a 62 y.o. 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  Not a candidate for outpatient IV antibiotics. Pertinent PMH/PSH includes HIV not on antiretrovirals.   Patient remains clinically stable.  [facility]     * (Principal) Streptococcal bacteremia     Mitral valve vegetation due to S. Pneumo. - Continue Rocephin for total of 6 weeks (7/1-8/12), patient is  not a candidate for outpatient therapy due to lack of insurance and  history of IVDU. - Repeat TTE 8/5, per cards recs   Meningitis due to S. Pneumo. 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 Pneumo  - Continue Rocephin for total of 6 weeks (day 15, 7/1-8/12), patient is  not a candidate for  outpatient therapy due to lack of insurance and  history of IVDU.        Hypotension     BP consistently in the 80s/60s during hospitalization. -Continue midodrine 10mg  TID        Weight loss     Patient has lost ~7 lbs during hospitalization. Most recent weight 7/20  108lbs, consistent with weight 2 weeks ago. - RD has seen pt, appreciate recs - daily weights - regular diet - continue Ensure 3 times daily       Chronic, stable problems: HIV - continue Biktarvy.  Recheck HIV viral load around end of July. Diarrhea -Imodium as needed Right shoulder pain/stiffness - pt has Tylenol, voltaren gel, lidocaine patch, heating pad as needed   FEN/GI: Regular diet PPx: SCDs, out of bed Dispo: Pending inpatient completion of IV antibiotics on 8/12.  Not a candidate for outpatient IV therapy.  Subjective:  Seen resting comfortably in bed this morning.  He is without complaint.  He denies any pain, shortness of breath, abdominal discomfort.  Objective: Temp:  [98 F (36.7 C)-99.1 F (37.3 C)] 98.3 F (36.8 C) (07/23 0839) Pulse Rate:  [76-83] 82 (07/23 0839) Resp:  [16-19] 16 (07/23 0834) BP: (83-89)/(60-68) 86/62 (07/23 0839) SpO2:  [98 %-100 %] 100 % (07/23 0839) Physical Exam: General: No acute distress Cardiovascular: RRR Respiratory: CTA bilaterally Abdomen: Bowel sounds.  Soft, nontender Extremities: Moves all extremities equally  Laboratory: Most recent CBC Lab  Results  Component Value Date   WBC 9.0 09/25/2022   HGB 9.7 (L) 09/25/2022   HCT 30.1 (L) 09/25/2022   MCV 91.2 09/25/2022   PLT 270 09/25/2022   Most recent BMP    Latest Ref Rng & Units 09/21/2022    4:50 AM  BMP  Glucose 70 - 99 mg/dL 841   BUN 8 - 23 mg/dL 8   Creatinine 3.24 - 4.01 mg/dL 0.27   Sodium 253 - 664 mmol/L 134   Potassium 3.5 - 5.1 mmol/L 4.0   Chloride 98 - 111 mmol/L 97   CO2 22 - 32 mmol/L 29   Calcium 8.9 - 10.3 mg/dL 8.6     [name], DO 09/27/2022, 9:50 AM  PGY-1,  [facility] FPTS Intern pager: [phone], text pages welcome Secure chat group [facility] Teaching Service

## 2022-09-27 NOTE — Assessment & Plan Note (Signed)
Patient has lost ~7 lbs during hospitalization. Most recent weight 7/20 108lbs, consistent with weight 2 weeks ago. - RD has seen pt, appreciate recs - daily weights - regular diet - continue Ensure 3 times daily

## 2022-09-27 NOTE — Assessment & Plan Note (Signed)
Mitral valve vegetation due to S. Pneumo. - Continue Rocephin for total of 6 weeks (7/1-8/12), patient is  not a candidate for outpatient therapy due to lack of insurance and  history of IVDU. - Repeat TTE 8/5, per cards recs   Meningitis due to S. Pneumo. - STAT head CT for any change in neuro exam  - BMP weekly  - Follow up with TCTS after IV abx therapy or any issues/changes to his valve

## 2022-09-27 NOTE — Progress Notes (Signed)
Physical Therapy Treatment Patient Details Name: Christopher Farrell MRN: [id number] DOB: 04-May-1960 Today's Date: 09/27/2022   History of Present Illness Pt is a 62 y/o male presenting on 7/1 with cough and AMS. Noted presented to ED on 6/26 and found with multifocal pneumonia. Returned to ED after being found unresponsive, admitted with sepsis. 2 seizures in ED. CSF reveals bacterial meningitis. MRI with "multifocal acute/subacute ischemia within the right greater than left hemispheres and left cerebellum. Largest lesions in R parietal and occipital lobes". Intubated 7/1-7/3. PMH: HIV.    PT Comments  Pt received in supine, agreeable to therapy session with encouragement, pt c/o no pants so PTA obtained paper pants for him. Pt needing up to Supervision for safety with gait/stair training using single rail and no overt LOB or buckling. Pt easily frustrated with questions so unable to perform full DGI, however pt fairly steady today and making good progress toward his goals. Pt would benefit from DGI or other higher level balance goal to be added as he is close to meeting current PT goals. Pt continues to benefit from PT services to progress toward functional mobility goals.      Assistance Recommended at Discharge Frequent or constant Supervision/Assistance  If plan is discharge home, recommend the following:  Can travel by private vehicle    Direct supervision/assist for medications management;Direct supervision/assist for financial management;Assist for transportation;A little help with walking and/or transfers;A little help with bathing/dressing/bathroom      Equipment Recommendations  None recommended by PT    Recommendations for Other Services       Precautions / Restrictions Precautions Precautions: Fall Restrictions Weight Bearing Restrictions: No     Mobility  Bed Mobility Overal bed mobility: Modified Independent             General bed mobility comments: No assist  needed.    Transfers Overall transfer level: Modified independent                 General transfer comment: Mod I to rise from bed, declines need for AD.    Ambulation/Gait Ambulation/Gait assistance: Supervision Gait Distance (Feet): 700 Feet Assistive device: None Gait Pattern/deviations: Step-through pattern, Decreased stride length, Wide base of support, Decreased dorsiflexion - right, Decreased dorsiflexion - left Gait velocity: decreased     General Gait Details: pt tends to occasionally shuffle but likely due to footwear (bedroom slippers without heel straps) no buckling or overt LOB; pt slightly frustrated with encouragement to progress mobility so defer full DGI this date.   Stairs Stairs: Yes Stairs assistance: Supervision Stair Management: One rail Right, Step to pattern, Forwards, Alternating pattern Number of Stairs: 6 General stair comments: min cues for safety, no buckling, pt with step-to pattern descending with LLE first due to mild LLE instability   Wheelchair Mobility     Tilt Bed    Modified Rankin (Stroke Patients Only) Modified Rankin (Stroke Patients Only) Pre-Morbid Rankin Score: No symptoms Modified Rankin: Moderate disability     Balance Overall balance assessment: Needs assistance Sitting-balance support: Feet supported, No upper extremity supported Sitting balance-Leahy Scale: Good     Standing balance support: During functional activity, No upper extremity supported Standing balance-Leahy Scale: Good                   Standardized Balance Assessment Standardized Balance Assessment : Dynamic Gait Index   Dynamic Gait Index Level Surface: Normal Gait and Pivot Turn: Normal Steps: Moderate Impairment      Cognition  Arousal/Alertness: Awake/alert Behavior During Therapy: Flat affect Overall Cognitive Status: Impaired/Different from baseline                                 General Comments: Following  simple single step commands consistently. Correctly turns left and right. Pt seems to become frustrated easily but when he expressed annoyance at not having pants, PTA obtained paper pants for him and pt was more calm and participated better in therapies.        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 and walking "a lot" PT Goal Formulation: With patient/family Time For Goal Achievement: 09/28/22 Progress towards PT goals: Progressing toward goals    Frequency    Min 3X/week      PT Plan Current plan remains appropriate       AM-PAC PT "6 Clicks" Mobility   Outcome Measure  Help needed turning from your back to your side while in a flat bed without using bedrails?: None Help needed moving from lying on your back to sitting on the side of a flat bed without using bedrails?: None Help needed moving to and from a bed to a chair (including a wheelchair)?: None Help needed standing up from a chair using your arms (e.g., wheelchair or bedside chair)?: None Help needed to walk in hospital room?: A Little Help needed climbing 3-5 steps with a railing? : A Little 6 Click Score: 22    End of Session   Activity Tolerance: Patient tolerated treatment well Patient left: with call bell/phone within reach;in bed (RN ok to leave bed alarm off as pt with fair safety awareness and able to make his needs known) Nurse Communication: Mobility status PT Visit Diagnosis: Unsteadiness on feet (R26.81);Other abnormalities of gait and mobility (R26.89);Muscle weakness (generalized) (M62.81);Difficulty in walking, not elsewhere classified (R26.2)      Time: 7829-5621 PT Time Calculation (min) (ACUTE ONLY): 15 min  Charges:    $Gait Training: 8-22 mins PT General Charges $$ ACUTE PT VISIT: 1 Visit                     Larena Ohnemus P., PTA Acute Rehabilitation Services Secure Chat Preferred 9a-5:30pm Office: [phone]    Dorathy Kinsman [facility] 09/27/2022, 6:46 PM

## 2022-09-27 NOTE — Assessment & Plan Note (Signed)
>>  ASSESSMENT AND PLAN FOR WEIGHT LOSS WRITTEN ON 09/27/2022  9:48 AM BY Ryon Layton, DO  Patient has lost ~7 lbs during hospitalization. Most recent weight 7/20 108lbs, consistent with weight 2 weeks ago. - RD has seen pt, appreciate recs - daily weights - regular diet - continue Ensure 3 times daily

## 2022-09-27 NOTE — Assessment & Plan Note (Signed)
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 Pneumo  - Continue Rocephin for total of 6 weeks (day 15, 7/1-8/12), patient is not a candidate for outpatient therapy due to lack of insurance and history of IVDU.

## 2022-09-28 LAB — HEPATIC FUNCTION PANEL
ALT: 16 U/L (ref 0–44)
AST: 16 U/L (ref 15–41)
Albumin: 2.5 g/dL — ABNORMAL LOW (ref 3.5–5.0)
Alkaline Phosphatase: 47 U/L (ref 38–126)
Bilirubin, Direct: <0.1 mg/dL (ref 0.0–0.2)
Total Bilirubin: 0.2 mg/dL — ABNORMAL LOW (ref 0.3–1.2)
Total Protein: 6.9 g/dL (ref 6.5–8.1)

## 2022-09-28 LAB — BASIC METABOLIC PANEL
Anion gap: 9 (ref 5–15)
BUN: 16 mg/dL (ref 8–23)
CO2: 27 mmol/L (ref 22–32)
Calcium: 8.8 mg/dL — ABNORMAL LOW (ref 8.9–10.3)
Chloride: 100 mmol/L (ref 98–111)
Creatinine, Ser: 0.67 mg/dL (ref 0.61–1.24)
GFR, Estimated: >60 mL/min (ref >60)
Glucose, Bld: 98 mg/dL (ref 70–99)
Potassium: 4.2 mmol/L (ref 3.5–5.1)
Sodium: 136 mmol/L (ref 135–145)

## 2022-09-28 LAB — CBC
HCT: 28.3 % — ABNORMAL LOW (ref 39.0–52.0)
Hemoglobin: 9.2 g/dL — ABNORMAL LOW (ref 13.0–17.0)
MCH: 29.8 pg (ref 26.0–34.0)
MCHC: 32.5 g/dL (ref 30.0–36.0)
MCV: 91.6 fL (ref 80.0–100.0)
Platelets: 249 10*3/uL (ref 150–400)
RBC: 3.09 MIL/uL — ABNORMAL LOW (ref 4.22–5.81)
RDW: 15.9 % — ABNORMAL HIGH (ref 11.5–15.5)
WBC: 8.6 10*3/uL (ref 4.0–10.5)
nRBC: 0 % (ref 0.0–0.2)

## 2022-09-28 MED ORDER — ESCITALOPRAM OXALATE 10 MG PO TABS
20.0000 mg | ORAL_TABLET | Freq: Every day | ORAL | Status: DC
  Administered 2022-09-28 – 2022-10-17 (×20): 20 mg via ORAL
  Filled 2022-09-28 (×20): qty 2

## 2022-09-28 NOTE — Assessment & Plan Note (Signed)
>>  ASSESSMENT AND PLAN FOR WEIGHT LOSS WRITTEN ON 09/28/2022  9:21 AM BY Mliss Sax, Amanie Mcculley, DO  Most recent weight 7/24 111lbs, increasing from low of 108lbs. - RD has seen pt, appreciate recs -Continue daily weights - regular diet - continue Ensure 3 times daily

## 2022-09-28 NOTE — Assessment & Plan Note (Signed)
Mitral valve vegetation due to S. Pneumo. - Continue Rocephin for total of 6 weeks (7/1-8/12), patient is  not a candidate for outpatient therapy due to lack of insurance and  history of IVDU. - Repeat TTE 8/5, per cards recs   Meningitis due to S. Pneumo. - STAT head CT for any change in neuro exam  - BMP weekly  - Follow up with TCTS after IV abx therapy or any issues/changes to his valve

## 2022-09-28 NOTE — Assessment & Plan Note (Signed)
BP consistently in the 80s/60s during hospitalization. -Continue midodrine 10mg  TID

## 2022-09-28 NOTE — Assessment & Plan Note (Signed)
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 Pneumo  - Continue Rocephin for total of 6 weeks (day 15, 7/1-8/12), patient is not a candidate for outpatient therapy due to lack of insurance and history of IVDU.

## 2022-09-28 NOTE — Plan of Care (Signed)
  Problem: Education: Goal: Individualized Educational Video(s) Outcome: Progressing   Problem: Coping: Goal: Ability to adjust to condition or change in health will improve Outcome: Progressing   Problem: Fluid Volume: Goal: Ability to maintain a balanced intake and output will improve Outcome: Progressing   Problem: Metabolic: Goal: Ability to maintain appropriate glucose levels will improve Outcome: Progressing   Problem: Nutritional: Goal: Maintenance of adequate nutrition will improve Outcome: Progressing Goal: Progress toward achieving an optimal weight will improve Outcome: Progressing   Problem: Skin Integrity: Goal: Risk for impaired skin integrity will decrease Outcome: Progressing   Problem: Tissue Perfusion: Goal: Adequacy of tissue perfusion will improve Outcome: Progressing   Problem: Clinical Measurements: Goal: Ability to maintain clinical measurements within normal limits will improve Outcome: Progressing Goal: Will remain free from infection Outcome: Progressing Goal: Diagnostic test results will improve Outcome: Progressing Goal: Respiratory complications will improve Outcome: Progressing Goal: Cardiovascular complication will be avoided Outcome: Progressing   Problem: Activity: Goal: Risk for activity intolerance will decrease Outcome: Progressing   Problem: Nutrition: Goal: Adequate nutrition will be maintained Outcome: Progressing   Problem: Coping: Goal: Level of anxiety will decrease Outcome: Progressing   Problem: Elimination: Goal: Will not experience complications related to bowel motility Outcome: Progressing Goal: Will not experience complications related to urinary retention Outcome: Progressing   Problem: Safety: Goal: Ability to remain free from injury will improve Outcome: Progressing   Problem: Skin Integrity: Goal: Risk for impaired skin integrity will decrease Outcome: Progressing   Problem: Education: Goal:  Knowledge of disease or condition will improve Outcome: Progressing Goal: Knowledge of secondary prevention will improve (MUST DOCUMENT ALL) Outcome: Progressing Goal: Knowledge of patient specific risk factors will improve Loraine Leriche N/A or DELETE if not current risk factor) Outcome: Progressing   Problem: Ischemic Stroke/TIA Tissue Perfusion: Goal: Complications of ischemic stroke/TIA will be minimized Outcome: Progressing   Problem: Coping: Goal: Will verbalize positive feelings about self Outcome: Progressing Goal: Will identify appropriate support needs Outcome: Progressing   Problem: Health Behavior/Discharge Planning: Goal: Ability to manage health-related needs will improve Outcome: Progressing Goal: Goals will be collaboratively established with patient/family Outcome: Progressing   Problem: Self-Care: Goal: Ability to participate in self-care as condition permits will improve Outcome: Progressing Goal: Verbalization of feelings and concerns over difficulty with self-care will improve Outcome: Progressing Goal: Ability to communicate needs accurately will improve Outcome: Progressing   Problem: Nutrition: Goal: Risk of aspiration will decrease Outcome: Progressing Goal: Dietary intake will improve Outcome: Progressing

## 2022-09-28 NOTE — Assessment & Plan Note (Signed)
Most recent weight 7/24 111lbs, increasing from low of 108lbs. - RD has seen pt, appreciate recs -Continue daily weights - regular diet - continue Ensure 3 times daily

## 2022-09-28 NOTE — Progress Notes (Signed)
Daily Progress Note Intern Pager: [phone]  Patient name: Christopher Farrell Medical record number: [id number] Date of birth: 1961/02/09 Age: 62 y.o. Gender: male  Primary Care Provider: Pcp, No Consultants: Radiology, CCM, ID, neurology Code Status: Full   Pt Overview and Major Events to Date:  7/1: Admission and Intubation  7/2: Brain MRI - multifocal acute ischemia in R>L cerebral hemispheres and cerebellum  7/4: Extubated 7/5: Transferred to [facility]MTS  7/8: TEE - MV multilobular vegetation  7/9: Prolonged IV Rocephin for 6 weeks total 7/10: Started Lexapro 10 mg 7/16: Brain MRI - similar artifact present need CT Head to r/o hemorrhage  7/17: CT head negative 7/19: repeated low BPs, started midodrine 5mg  7/21: Increased midodrine to 10 mg 1/24: Increase Lexapro to 20 mg   Assessment and Plan: Christopher Farrell is a 62 y.o. 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  Not a candidate for outpatient IV antibiotics. Pertinent PMH/PSH includes HIV not on antiretrovirals.   Patient remains clinically stable.  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 Pneumo. - Continue Rocephin for total of 6 weeks (7/1-8/12), patient is  not a candidate for outpatient therapy due to lack of insurance and  history of IVDU. - Repeat TTE 8/5, per cards recs   Meningitis due to S. Pneumo. - STAT head CT for any change in neuro exam  - BMP weekly  - Follow up with TCTS after IV abx therapy or any issues/changes to his  valve         Anxiety and depression     Patient was started on Lexapro 10 mg 7/10.  He would like to increase  this. - Increase Lexapro to 20 mg        Acute bacterial endocarditis     1.5  atrial mass with large vegetation of anterior mitral leaflet due to  S. Pneumo  - Continue Rocephin for total of 6 weeks (day 15, 7/1-8/12), patient is  not a candidate for outpatient therapy due to lack of insurance and  history of IVDU.        Hypotension     BP consistently in the 80s/60s during hospitalization. -Continue midodrine 10mg  TID        Weight loss     Most recent weight 7/24 111lbs, increasing from low of 108lbs. - RD has seen pt, appreciate recs -Continue daily weights - regular diet - continue Ensure 3 times daily       Chronic, stable problems: HIV - continue Biktarvy.  Recheck HIV viral load around end of July. Diarrhea -Imodium as needed Right shoulder pain/stiffness - pt has Tylenol, voltaren gel, lidocaine patch, heating pad as needed   FEN/GI: Regular diet PPx: SCDs, out of bed Dispo: Pending inpatient completion of IV antibiotics on 8/12.  Not a candidate for outpatient IV therapy.  Subjective:  Patient seen this morning resting in bed watching TV.  He states he is doing fine and had no issues overnight.  He does report that he lost his work note and his recent move from one room to another.  I assured him that this was in the computer and he did have another copy printed as needed.  He  would like to try increasing his Lexapro to improve his mood and energy levels.  Objective: Temp:  [98 F (36.7 C)-98.7 F (37.1 C)] 98.7 F (37.1 C) (07/24 0753) Pulse Rate:  [74-82] 82 (07/24 0753) Resp:  [16-20] 20 (07/24 0753) BP: (83-87)/(65-70) 87/70 (07/24 0753) SpO2:  [99 %-100 %] 99 % (07/24 0753) Weight:  [50.4 kg] 50.4 kg (07/24 0532) Physical Exam: General: No acute distress Cardiovascular: RRR Respiratory: CTA bilaterally Abdomen: Soft, nontender, nondistended. Extremities: Moves all extremities equally.  Laboratory: Most recent CBC Lab Results  Component Value Date   WBC 8.6 09/28/2022   HGB 9.2 (L) 09/28/2022   HCT 28.3 (L) 09/28/2022    MCV 91.6 09/28/2022   PLT 249 09/28/2022   Most recent BMP    Latest Ref Rng & Units 09/28/2022    4:45 AM  BMP  Glucose 70 - 99 mg/dL 98   BUN 8 - 23 mg/dL 16   Creatinine 1.61 - 1.24 mg/dL 0.96   Sodium 045 - 409 mmol/L 136   Potassium 3.5 - 5.1 mmol/L 4.2   Chloride 98 - 111 mmol/L 100   CO2 22 - 32 mmol/L 27   Calcium 8.9 - 10.3 mg/dL 8.8     [name], DO 09/28/2022, 9:26 AM  PGY-1, [facility] FPTS Intern pager: [phone], text pages welcome Secure chat group [facility] Teaching Service

## 2022-09-28 NOTE — Assessment & Plan Note (Signed)
Patient was started on Lexapro 10 mg 7/10.  He would like to increase this. - Increase Lexapro to 20 mg

## 2022-09-28 NOTE — Progress Notes (Signed)
**Note Christopher-Identified via Obfuscation** Occupational Therapy Treatment Patient Details Name: Christopher Farrell MRN: [id number] DOB: 1960-08-26 Today's Date: 09/28/2022   History of present illness Pt is a 62 y/o male presenting on 7/1 with cough and AMS. Noted presented to ED on 6/26 and found with multifocal pneumonia. Returned to ED after being found unresponsive, admitted with sepsis. 2 seizures in ED. CSF reveals bacterial meningitis. MRI with "multifocal acute/subacute ischemia within the right greater than left hemispheres and left cerebellum. Largest lesions in R parietal and occipital lobes". Intubated 7/1-7/3. PMH: HIV.   OT comments  Pt progressing toward established OT goals. Continued assessment of perception/vision this session with functional mobility into hall with min amount of obstacles and distraction. Pt with good recognition of obstacles on L and R and able to respond timely to door opening toward him on L. Basic visual fields testing performed and pt with slowed processing? Will continue to assess. Pt with difficulty path finding in hall having walked R out of room and once turned around, passing room and walking down another hall. Min cues to use signage to determine where room was located. Once used signage able to locate room without further cues. Due to pt normally walks 45 minutes to word and has to cross several streets, pt benefiting from continued OT services to optimize attention to environment and executive function.    Recommendations for follow up therapy are one component of a multi-disciplinary discharge planning process, led by the attending physician.  Recommendations may be updated based on patient status, additional functional criteria and insurance authorization.    Assistance Recommended at Discharge Frequent or constant Supervision/Assistance  Patient can return home with the following  Assistance with cooking/housework;Direct supervision/assist for medications management;Direct supervision/assist  for financial management;Assist for transportation   Equipment Recommendations  None recommended by OT    Recommendations for Other Services      Precautions / Restrictions Precautions Precautions: Fall Restrictions Weight Bearing Restrictions: No       Mobility Bed Mobility Overal bed mobility: Modified Independent             General bed mobility comments: No assist needed.    Transfers Overall transfer level: Modified independent                 General transfer comment: Mod I to rise from bed, declines need for AD.     Balance Overall balance assessment: Needs assistance Sitting-balance support: Feet supported, No upper extremity supported       Standing balance support: During functional activity, No upper extremity supported Standing balance-Leahy Scale: Good                             ADL either performed or assessed with clinical judgement   ADL Overall ADL's : Needs assistance/impaired     Grooming: Modified independent;Wash/dry hands                   Toilet Transfer: Ambulation;Modified Independent Toilet Transfer Details (indicate cue type and reason): Pt navigating restroom and taking IV pole with him without assist Toileting- Clothing Manipulation and Hygiene: Modified independent;Sit to/from stand              Extremity/Trunk Assessment              Vision   Additional Comments: Able to read therapist hand writing thanking him for participating in OT session. Aware of obstacles on L and R sides. Basic visual  fields screen able to detect on L and R ~75 degrees from midline. Perhaps slowed processing? will continue to assess.   Perception Perception Perception: Impaired (Per prior notes, appears to demo decr attention to L at times, but self locating trash can o L and placing at L side of bed. With automatic doors open, pt in way of L door and backing up in timely manner. will continue to assess.)    Praxis      Cognition Arousal/Alertness: Awake/alert Behavior During Therapy: Flat affect Overall Cognitive Status: Impaired/Different from baseline Area of Impairment: Attention, Memory, Following commands, Problem solving, Awareness                   Current Attention Level: Selective (can attend to therapist/task with TV on, overall, not attentive to environment)   Following Commands: Follows one step commands consistently Safety/Judgement: Decreased awareness of safety, Decreased awareness of deficits Awareness: Emergent Problem Solving: Slow processing          Exercises      Shoulder Instructions       General Comments      Pertinent Vitals/ Pain       Pain Assessment Pain Assessment: No/denies pain  Home Living                                          Prior Functioning/Environment              Frequency  Min 1X/week        Progress Toward Goals  OT Goals(current goals can now be found in the care plan section)  Progress towards OT goals: Progressing toward goals  Acute Rehab OT Goals Patient Stated Goal: to get some ice OT Goal Formulation: With patient Time For Goal Achievement: 10/07/22 Potential to Achieve Goals: Good  Plan Discharge plan remains appropriate    Co-evaluation                 AM-PAC OT "6 Clicks" Daily Activity     Outcome Measure   Help from another person eating meals?: None Help from another person taking care of personal grooming?: None Help from another person toileting, which includes using toliet, bedpan, or urinal?: A Little Help from another person bathing (including washing, rinsing, drying)?: A Little Help from another person to put on and taking off regular upper body clothing?: A Little Help from another person to put on and taking off regular lower body clothing?: A Little 6 Click Score: 20    End of Session Equipment Utilized During Treatment: Gait belt  OT Visit  Diagnosis: Other abnormalities of gait and mobility (R26.89);Muscle weakness (generalized) (M62.81);Pain;Other symptoms and signs involving cognitive function;Cognitive communication deficit (R41.841);Other symptoms and signs involving the nervous system (R29.898)   Activity Tolerance Patient tolerated treatment well   Patient Left in bed;with call bell/phone within reach;with bed alarm set   Nurse Communication Mobility status        Time: 1530-1600 OT Time Calculation (min): 30 min  Charges: OT General Charges $OT Visit: 1 Visit OT Treatments $Self Care/Home Management : 8-22 mins $Therapeutic Activity: 8-22 mins  Tyler Deis, OTR/L [facility] Acute Rehabilitation Office: [phone]   Myrla Halsted 09/28/2022, 4:43 PM

## 2022-09-29 DIAGNOSIS — R634 Abnormal weight loss: Secondary | ICD-10-CM

## 2022-09-29 DIAGNOSIS — B955 Unspecified streptococcus as the cause of diseases classified elsewhere: Secondary | ICD-10-CM

## 2022-09-29 DIAGNOSIS — I959 Hypotension, unspecified: Secondary | ICD-10-CM

## 2022-09-29 DIAGNOSIS — R7881 Bacteremia: Secondary | ICD-10-CM

## 2022-09-29 NOTE — Assessment & Plan Note (Signed)
Patient was started on Lexapro 10 mg 7/10.  He would like to increase this. - Continue Lexapro 20 mg - consider adding wellbutrin if mood does not respond

## 2022-09-29 NOTE — Assessment & Plan Note (Signed)
BP consistently in the 80s/60s during hospitalization. -Continue midodrine 10mg  TID

## 2022-09-29 NOTE — Assessment & Plan Note (Signed)
Mitral valve vegetation due to S. Pneumo. - Continue Rocephin for total of 6 weeks (7/1-8/12), patient is  not a candidate for outpatient therapy due to lack of insurance and  history of IVDU. - Repeat TTE 8/5, per cards recs   Meningitis due to S. Pneumo. - STAT head CT for any change in neuro exam  - BMP weekly  - Follow up with TCTS after IV abx therapy or any issues/changes to his valve

## 2022-09-29 NOTE — Progress Notes (Signed)
Speech Language Pathology Treatment: Cognitive-Linquistic  Patient Details Name: Christopher Farrell MRN: [id number] DOB: 10-14-1960 Today's Date: 09/29/2022 Time: 6213-0865 SLP Time Calculation (min) (ACUTE ONLY): 11 min  Assessment / Plan / Recommendation Clinical Impression  Pt seen today to target attention and verbalizing needs. SLP presented pt with a pillbox activity. Pt correctly identified 4/6 errors given Min verbal and visual cueing. He greatly benefited from a visual marking beside the target pill before identifying errors. Pt required Mod verbal cueing to participate in activity, although demonstrated good selective attention throughout the session. Will continue to follow up 1/week to address cognitive goals.    HPI HPI: Pt is a 62 y/o male presenting on 7/1 with cough and AMS. 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 Dx infective endocarditis - will require six weeks of abx, ending 8/12. Intubated 7/1-3.  MRI with "multifocal acute/subacute ischemia within the right greater than left hemispheres and left cerebellum. Largest lesions in R parietal and occipital lobes".  PMH: HIV.      SLP Plan  Continue with current plan of care      Recommendations for follow up therapy are one component of a multi-disciplinary discharge planning process, led by the attending physician.  Recommendations may be updated based on patient status, additional functional criteria and insurance authorization.    Recommendations                     Oral care BID   Frequent or constant Supervision/Assistance Cognitive communication deficit (H84.696)     Continue with current plan of care     Gwynneth Aliment, M.A., CF-SLP Speech Language Pathology, Acute Rehabilitation Services  Secure Chat preferred [phone]   09/29/2022, 2:50 PM

## 2022-09-29 NOTE — Assessment & Plan Note (Addendum)
Weight this AM 111lbs, increasing from low of 108lbs. Patient reports good appetite. - RD has seen pt, appreciate recs -Continue daily weights - regular diet - continue Ensure 3 times daily

## 2022-09-29 NOTE — Assessment & Plan Note (Signed)
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 Pneumo  - Continue Rocephin for total of 6 weeks (7/1-8/12), patient is not a candidate for outpatient therapy due to lack of insurance and history of IVDU.

## 2022-09-29 NOTE — Progress Notes (Signed)
Nutrition Follow-up  DOCUMENTATION CODES:   Severe malnutrition in context of chronic illness  INTERVENTION:   Continue daily weights   Continue Ensure Plus High Protein po TID, each supplement provides 350 kcal and 20 grams of protein.  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 -ongoing   GOAL:   Patient will meet greater than or equal to 90% of their needs -progressing   MONITOR:   PO intake, Supplement acceptance, Diet advancement, Labs  REASON FOR ASSESSMENT:   Consult Assessment of nutrition requirement/status  ASSESSMENT:   Pt with hx of HIV (not compliant with antivirals) and EtOH/tobacco abuse presented to ED with AMS after recently being diagnosed with pneumonia. Found to be septic and then suffered a seizure in ED.  Visited patient at bedside who awakened briefly to talk with RD. He reports good appetite and eating well along with consuming ONS.   No reported N/V/D/C, trouble chewing/swallowing   Labs: reviewed  Meds: Biktarvy, rocephin, lexapro, Ensure TID, NS, thiamine  Wt: admit- 115#, current 112# PO: 100% x last 5 documented meals  I/O's:  +1.3 L    Diet Order:   Diet Order             Diet regular Room service appropriate? Yes with Assist; Fluid consistency: Thin  Diet effective now                   EDUCATION NEEDS:   Not appropriate for education at this time  Skin:  Skin Assessment: Reviewed RN Assessment  Last BM:  7/25  Height:   Ht Readings from Last 1 Encounters:  09/13/22 5\' 4"  (1.626 m)    Weight:   Wt Readings from Last 1 Encounters:  09/29/22 50.7 kg    Ideal Body Weight:  59.1 kg  BMI:  Body mass index is 19.17  kg/m.  Estimated Nutritional Needs:   Kcal:  1700-2000 kcal/d  Protein:  85-100 g/d  Fluid:  >/=1.8L/d   Leodis Rains, RDN, LDN  Clinical Nutrition

## 2022-09-29 NOTE — Assessment & Plan Note (Signed)
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 Pneumo  - Continue Rocephin for total of 6 weeks (day 15, 7/1-8/12), patient is not a candidate for outpatient therapy due to lack of insurance and history of IVDU.

## 2022-09-29 NOTE — Assessment & Plan Note (Signed)
>>  ASSESSMENT AND PLAN FOR WEIGHT LOSS WRITTEN ON 09/29/2022  9:36 AM BY Marwan Lipe, DO  Weight this AM 111lbs, increasing from low of 108lbs. Patient reports good appetite. - RD has seen pt, appreciate recs -Continue daily weights - regular diet - continue Ensure 3 times daily

## 2022-09-29 NOTE — Plan of Care (Signed)
Patient alert/oriented X4. Patient compliant with medication administration and tolerated IV abx. Patient was up in chair for a few hours. Patient complains of no pain in R shoulder after lidocaine patch applied to R shoulder. VSS, no complaints at this time.  Problem: Education: Goal: Ability to describe self-care measures that may prevent or decrease complications (Diabetes Survival Skills Education) will improve Outcome: Progressing   Problem: Education: Goal: Individualized Educational Video(s) Outcome: Progressing   Problem: Coping: Goal: Ability to adjust to condition or change in health will improve Outcome: Progressing   Problem: Fluid Volume: Goal: Ability to maintain a balanced intake and output will improve Outcome: Progressing   Problem: Health Behavior/Discharge Planning: Goal: Ability to identify and utilize available resources and services will improve Outcome: Progressing   Problem: Health Behavior/Discharge Planning: Goal: Ability to manage health-related needs will improve Outcome: Progressing   Problem: Metabolic: Goal: Ability to maintain appropriate glucose levels will improve Outcome: Progressing   Problem: Nutritional: Goal: Maintenance of adequate nutrition will improve Outcome: Progressing   Problem: Nutritional: Goal: Progress toward achieving an optimal weight will improve Outcome: Progressing   Problem: Skin Integrity: Goal: Risk for impaired skin integrity will decrease Outcome: Progressing   Problem: Tissue Perfusion: Goal: Adequacy of tissue perfusion will improve Outcome: Progressing   Problem: Education: Goal: Knowledge of General Education information will improve Description: Including pain rating scale, medication(s)/side effects and non-pharmacologic comfort measures Outcome: Progressing   Problem: Health Behavior/Discharge Planning: Goal: Ability to manage health-related needs will improve Outcome: Progressing   Problem:  Clinical Measurements: Goal: Ability to maintain clinical measurements within normal limits will improve Outcome: Progressing   Problem: Clinical Measurements: Goal: Will remain free from infection Outcome: Progressing   Problem: Clinical Measurements: Goal: Diagnostic test results will improve Outcome: Progressing   Problem: Clinical Measurements: Goal: Respiratory complications will improve Outcome: Progressing   Problem: Clinical Measurements: Goal: Cardiovascular complication will be avoided Outcome: Progressing   Problem: Activity: Goal: Risk for activity intolerance will decrease Outcome: Progressing   Problem: Nutrition: Goal: Adequate nutrition will be maintained Outcome: Progressing   Problem: Coping: Goal: Level of anxiety will decrease Outcome: Progressing   Problem: Elimination: Goal: Will not experience complications related to bowel motility Outcome: Progressing   Problem: Elimination: Goal: Will not experience complications related to urinary retention Outcome: Progressing   Problem: Safety: Goal: Ability to remain free from injury will improve Outcome: Progressing   Problem: Skin Integrity: Goal: Risk for impaired skin integrity will decrease Outcome: Progressing   Problem: Education: Goal: Knowledge of disease or condition will improve Outcome: Progressing   Problem: Education: Goal: Knowledge of secondary prevention will improve (MUST DOCUMENT ALL) Outcome: Progressing   Problem: Education: Goal: Knowledge of patient specific risk factors will improve Loraine Leriche N/A or DELETE if not current risk factor) Outcome: Progressing   Problem: Ischemic Stroke/TIA Tissue Perfusion: Goal: Complications of ischemic stroke/TIA will be minimized Outcome: Progressing   Problem: Coping: Goal: Will verbalize positive feelings about self Outcome: Progressing   Problem: Coping: Goal: Will identify appropriate support needs Outcome: Progressing    Problem: Health Behavior/Discharge Planning: Goal: Ability to manage health-related needs will improve Outcome: Progressing   Problem: Health Behavior/Discharge Planning: Goal: Goals will be collaboratively established with patient/family Outcome: Progressing   Problem: Self-Care: Goal: Ability to participate in self-care as condition permits will improve Outcome: Progressing   Problem: Self-Care: Goal: Verbalization of feelings and concerns over difficulty with self-care will improve Outcome: Progressing   Problem: Self-Care: Goal: Ability to communicate needs  accurately will improve Outcome: Progressing   Problem: Nutrition: Goal: Risk of aspiration will decrease Outcome: Progressing   Problem: Nutrition: Goal: Dietary intake will improve Outcome: Progressing

## 2022-09-29 NOTE — Plan of Care (Signed)
  Problem: Education: Goal: Ability to describe self-care measures that may prevent or decrease complications (Diabetes Survival Skills Education) will improve Outcome: Progressing Goal: Individualized Educational Video(s) Outcome: Progressing   Problem: Coping: Goal: Ability to adjust to condition or change in health will improve Outcome: Progressing   Problem: Fluid Volume: Goal: Ability to maintain a balanced intake and output will improve Outcome: Progressing   Problem: Health Behavior/Discharge Planning: Goal: Ability to identify and utilize available resources and services will improve Outcome: Progressing Goal: Ability to manage health-related needs will improve Outcome: Progressing   Problem: Metabolic: Goal: Ability to maintain appropriate glucose levels will improve Outcome: Progressing   Problem: Nutritional: Goal: Maintenance of adequate nutrition will improve Outcome: Progressing Goal: Progress toward achieving an optimal weight will improve Outcome: Progressing   Problem: Skin Integrity: Goal: Risk for impaired skin integrity will decrease Outcome: Progressing   Problem: Tissue Perfusion: Goal: Adequacy of tissue perfusion will improve Outcome: Progressing   Problem: Education: Goal: Knowledge of General Education information will improve Description: Including pain rating scale, medication(s)/side effects and non-pharmacologic comfort measures Outcome: Progressing   Problem: Health Behavior/Discharge Planning: Goal: Ability to manage health-related needs will improve Outcome: Progressing   Problem: Clinical Measurements: Goal: Ability to maintain clinical measurements within normal limits will improve Outcome: Progressing Goal: Will remain free from infection Outcome: Progressing Goal: Diagnostic test results will improve Outcome: Progressing Goal: Respiratory complications will improve Outcome: Progressing Goal: Cardiovascular complication will  be avoided Outcome: Progressing   Problem: Activity: Goal: Risk for activity intolerance will decrease Outcome: Progressing   Problem: Nutrition: Goal: Adequate nutrition will be maintained Outcome: Progressing   Problem: Coping: Goal: Level of anxiety will decrease Outcome: Progressing   Problem: Elimination: Goal: Will not experience complications related to bowel motility Outcome: Progressing Goal: Will not experience complications related to urinary retention Outcome: Progressing   Problem: Safety: Goal: Ability to remain free from injury will improve Outcome: Progressing   Problem: Skin Integrity: Goal: Risk for impaired skin integrity will decrease Outcome: Progressing   Problem: Education: Goal: Knowledge of disease or condition will improve Outcome: Progressing Goal: Knowledge of secondary prevention will improve (MUST DOCUMENT ALL) Outcome: Progressing Goal: Knowledge of patient specific risk factors will improve Loraine Leriche N/A or DELETE if not current risk factor) Outcome: Progressing   Problem: Ischemic Stroke/TIA Tissue Perfusion: Goal: Complications of ischemic stroke/TIA will be minimized Outcome: Progressing   Problem: Coping: Goal: Will verbalize positive feelings about self Outcome: Progressing Goal: Will identify appropriate support needs Outcome: Progressing   Problem: Health Behavior/Discharge Planning: Goal: Ability to manage health-related needs will improve Outcome: Progressing Goal: Goals will be collaboratively established with patient/family Outcome: Progressing   Problem: Self-Care: Goal: Ability to participate in self-care as condition permits will improve Outcome: Progressing Goal: Verbalization of feelings and concerns over difficulty with self-care will improve Outcome: Progressing Goal: Ability to communicate needs accurately will improve Outcome: Progressing   Problem: Nutrition: Goal: Risk of aspiration will decrease Outcome:  Progressing Goal: Dietary intake will improve Outcome: Progressing

## 2022-09-29 NOTE — Progress Notes (Signed)
Daily Progress Note Intern Pager: [phone]  Patient name: Christopher Farrell Medical record number: [id number] Date of birth: [birth year] Age: 62 y.o. Gender: male  Primary Care Provider: Pcp, No Consultants: Radiology, CCM, ID, neurology Code Status: Full   Pt Overview and Major Events to Date:  7/1: Admission and Intubation  7/2: Brain MRI - multifocal acute ischemia in R>L cerebral hemispheres and cerebellum  7/4: Extubated 7/5: Transferred to [facility]  7/8: TEE - MV multilobular vegetation  7/9: Prolonged IV Rocephin for 6 weeks total 7/10: Started Lexapro 10 mg 7/16: Brain MRI - similar artifact present need CT Head to r/o hemorrhage  7/17: CT head negative 7/19: repeated low BPs, started midodrine 5mg  7/21: Increased midodrine to 10 mg 7/24: Increase Lexapro to 20 mg   Assessment and Plan: Christopher Farrell is a 62 y.o. 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  Not a candidate for outpatient IV antibiotics. Pertinent PMH/PSH includes HIV not on antiretrovirals.   Patient remains clinically stable.  [facility]     * (Principal) Streptococcal bacteremia     Mitral valve vegetation due to S. Pneumo. - Continue Rocephin for total of 6 weeks (7/1-8/12), patient is  not a candidate for outpatient therapy due to lack of insurance and  history of IVDU. - Repeat TTE 8/5, per cards recs   Meningitis due to S. Pneumo. - STAT head CT for any change in neuro exam  - BMP weekly  - Follow up with [facility] after IV abx therapy or any issues/changes to his  valve         Anxiety and depression     Patient was started on Lexapro 10 mg 7/10.  He would like to increase  this. - Continue Lexapro 20 mg - consider adding wellbutrin if mood does not  respond        Acute bacterial endocarditis     1.5 atrial mass with large vegetation of anterior mitral leaflet due to  S. Pneumo  - Continue Rocephin for total of 6 weeks (day 15, 7/1-8/12), patient is  not a candidate for outpatient therapy due to lack of insurance and  history of IVDU.        Hypotension     BP consistently in the 80s/60s during hospitalization. -Continue midodrine 10mg  TID        Weight loss     Weight this AM 111lbs, increasing from low of 108lbs. Patient reports  good appetite. - RD has seen pt, appreciate recs -Continue daily weights - regular diet - continue Ensure 3 times daily       Chronic, stable problems: HIV - continue Biktarvy.  Recheck HIV viral load around end of July. Diarrhea -Imodium as needed Right shoulder pain/stiffness - pt has Tylenol, voltaren gel, lidocaine patch, heating pad as needed   FEN/GI: Regular diet PPx: SCDs, out of bed Dispo: Pending inpatient completion of IV antibiotics on 8/12.  Not a candidate for outpatient IV therapy.  Subjective:  Patient seen resting in bed this morning.  He is without complaint.  Reports good appetite.  Objective: Temp:  [98.1 F (36.7 C)-98.3 F (36.8 C)] 98.3 F (36.8 C) (07/25 0717) Pulse Rate:  [83-93] 83 (07/25 0717) Resp:  [18-20] 18 (07/25 0717) BP: (84-90)/(66-69) 84/66 (  07/25 0717) SpO2:  [99 %-100 %] 99 % (07/25 0717) Weight:  [50.7 kg] 50.7 kg (07/25 0600) Physical Exam: General: No acute distress Cardiovascular: RRR Respiratory: CTA bilaterally Abdomen: Normoactive bowel sounds.  Soft, nontender, nondistended Extremities: Moves all extremities equally  Laboratory: Most recent CBC Lab Results  Component Value Date   WBC 8.6 09/28/2022   HGB 9.2 (L) 09/28/2022   HCT 28.3 (L) 09/28/2022   MCV 91.6 09/28/2022   PLT 249 09/28/2022   Most recent BMP    Latest Ref Rng & Units 09/28/2022    4:45 AM  BMP  Glucose 70 - 99 mg/dL 98   BUN 8 - 23 mg/dL 16    Creatinine 4.13 - 1.24 mg/dL 2.44   Sodium 010 - 272 mmol/L 136   Potassium 3.5 - 5.1 mmol/L 4.2   Chloride 98 - 111 mmol/L 100   CO2 22 - 32 mmol/L 27   Calcium 8.9 - 10.3 mg/dL 8.8    Christopher Skeeters, DO 09/29/2022, 9:36 AM  PGY-1, [facility] FPTS Intern pager: [phone], text pages welcome Secure chat group [facility] Teaching Service

## 2022-09-29 NOTE — Progress Notes (Signed)
Physical Therapy Treatment Patient Details Name: Christopher Farrell MRN: [id number] DOB: Jun 21, 1960 Today's Date: 09/29/2022   History of Present Illness Pt is a 62 y/o male presenting on 7/1 with cough and AMS. Noted presented to ED on 6/26 and found with multifocal pneumonia. Returned to ED after being found unresponsive, admitted with sepsis. 2 seizures in ED. CSF reveals bacterial meningitis. MRI with "multifocal acute/subacute ischemia within the right greater than left hemispheres and left cerebellum. Largest lesions in R parietal and occipital lobes". Intubated 7/1-7/3. PMH: HIV.    PT Comments  Pt was seen for gait visit, during which pt tended to be dismissive of the need for therapy and was unclear on the objective of PT.  He voices concern about hospital clothing to walk on the hallway, but was covered in the process of getting up to walk.  This seems more a cognitive issue but is also demonstrating some instability that is worsened by his choice of slippers for footwear.  Given the lateral shifts of gait on the hallway, recommend him to have at least one session a week of therapy, to continue to observe and encourage distances.  May adjust frequency as needed, but revisit in a week.     Assistance Recommended at Discharge Frequent or constant Supervision/Assistance  If plan is discharge home, recommend the following:  Can travel by private vehicle    Direct supervision/assist for medications management;Direct supervision/assist for financial management;Assist for transportation;A little help with walking and/or transfers;A little help with bathing/dressing/bathroom      Equipment Recommendations  None recommended by PT    Recommendations for Other Services       Precautions / Restrictions Precautions Precautions: Fall Precaution Comments: has IV and reports he walks alone with IV Restrictions Weight Bearing Restrictions: No     Mobility  Bed Mobility Overal bed  mobility: Modified Independent                  Transfers Overall transfer level: Modified independent                      Ambulation/Gait Ambulation/Gait assistance: Supervision Gait Distance (Feet): 300 Feet Assistive device: None Gait Pattern/deviations: Step-through pattern, Wide base of support, Shuffle Gait velocity: decreased Gait velocity interpretation: <1.31 ft/sec, indicative of household ambulator Pre-gait activities: standing balance ck General Gait Details: pt is mildly frustrated with being asked to walk and tends to shuffle in slippers.  Has a great deal of expectation that he is going to walk independently in his room, but not clear that nursing has given permission   Stairs             Wheelchair Mobility     Tilt Bed    Modified Rankin (Stroke Patients Only)       Balance Overall balance assessment: Needs assistance Sitting-balance support: Feet supported Sitting balance-Leahy Scale: Good     Standing balance support: No upper extremity supported Standing balance-Leahy Scale: Good                              Cognition Arousal/Alertness: Awake/alert Behavior During Therapy: Flat affect Overall Cognitive Status: Impaired/Different from baseline Area of Impairment: Problem solving, Safety/judgement, Attention                   Current Attention Level: Selective     Safety/Judgement: Decreased awareness of safety, Decreased awareness of deficits Awareness: Emergent Problem  Solving: Slow processing General Comments: pt is not happy about using paper pants although this was not an issue last visit, used them appreciatively        Exercises      General Comments General comments (skin integrity, edema, etc.): pt was able to take DGI last visit, but today is a bit laterally wandering off walking path, distracted by wearing hosp clothing and genuinely dislikes being out on hallway where he can be  observed      Pertinent Vitals/Pain Pain Assessment Pain Assessment: No/denies pain    Home Living                          Prior Function            PT Goals (current goals can now be found in the care plan section) Acute Rehab PT Goals Patient Stated Goal: 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: None Help needed to walk in hospital room?: A Little Help needed climbing 3-5 steps with a railing? : A Little 6 Click Score: 22    End of Session Equipment Utilized During Treatment: Gait belt Activity Tolerance: Patient tolerated treatment well Patient left: with call bell/phone within reach;in bed Nurse Communication: Mobility status PT Visit Diagnosis: Unsteadiness on feet (R26.81);Other abnormalities of gait and mobility (R26.89);Muscle weakness (generalized) (M62.81);Difficulty in walking, not elsewhere classified (R26.2)     Time: 1610-9604 PT Time Calculation (min) (ACUTE ONLY): 19 min  Charges:    $Gait Training: 8-22 mins PT General Charges $$ ACUTE PT VISIT: 1 Visit          Ivar Drape 09/29/2022, 2:30 PM  Samul Dada, PT PhD Acute Rehab Dept. Number: [facility] [id number] and [facility] [phone]

## 2022-09-30 ENCOUNTER — Ambulatory Visit: Payer: Self-pay | Admitting: Nurse Practitioner

## 2022-09-30 DIAGNOSIS — E43 Unspecified severe protein-calorie malnutrition: Secondary | ICD-10-CM | POA: Insufficient documentation

## 2022-09-30 NOTE — TOC Progression Note (Signed)
Transition of Care [facility]) - Progression Note    Patient Details  Name: Christopher Farrell MRN: [id number] Date of Birth: 09/04/1960  [facility] CM/SW Contact  Kermit Balo, RN Phone Number: 09/30/2022, 10:19 AM  Clinical Narrative:    Pt to remain hospitalized until 10/17/22 for IV abx.  TOC following.   Expected Discharge Plan: Home/Self Care    Expected Discharge Plan and Services                                               Social Determinants of Health (SDOH) Interventions SDOH Screenings   Food Insecurity: No Food Insecurity (09/13/2022)  Housing: Patient Declined (09/13/2022)  Transportation Needs: No Transportation Needs (09/13/2022)  Utilities: Not At Risk (09/13/2022)  Depression (PHQ2-9): High Risk (09/02/2022)  Tobacco Use: High Risk (09/13/2022)    Readmission Risk Interventions     No data to display

## 2022-09-30 NOTE — Progress Notes (Signed)
Occupational Therapy Treatment Patient Details Name: Christopher Farrell MRN: [id number] DOB: [birth year] Today's Date: 09/30/2022   History of present illness Pt is a 62 y/o male presenting on 7/1 with cough and AMS. Noted presented to ED on 6/26 and found with multifocal pneumonia. Returned to ED after being found unresponsive, admitted with sepsis. 2 seizures in ED. CSF reveals bacterial meningitis. MRI with "multifocal acute/subacute ischemia within the right greater than left hemispheres and left cerebellum. Largest lesions in R parietal and occipital lobes". Intubated 7/1-7/3. PMH: HIV.   OT comments  Pt with good progress toward established OT goals. Performing functional mobility into hall and to central tower from [employer]. Pt with good tolerance; observed to run into wall 1x on the R. Pt able to navigate back to room without cues. Pt performing location of 4 words on word search with min encouragement. Able to locate 4 words in ~5-6 mins. Good technique. Initially with difficulty problem solving and deductive reasoning to spell more complex words in search as indicated in key but improving after assist with first word. Will continue to follow acutely. HHOT remains appropriate.   Recommendations for follow up therapy are one component of a multi-disciplinary discharge planning process, led by the attending physician.  Recommendations may be updated based on patient status, additional functional criteria and insurance authorization.    Assistance Recommended at Discharge Frequent or constant Supervision/Assistance  Patient can return home with the following  Assistance with cooking/housework;Direct supervision/assist for medications management;Direct supervision/assist for financial management;Assist for transportation   Equipment Recommendations  None recommended by OT    Recommendations for Other Services      Precautions / Restrictions Precautions Precautions: Fall Precaution  Comments: has IV and reports he walks alone with IV Restrictions Weight Bearing Restrictions: No       Mobility Bed Mobility Overal bed mobility: Modified Independent                  Transfers Overall transfer level: Modified independent                       Balance Overall balance assessment: Needs assistance Sitting-balance support: Feet supported Sitting balance-Leahy Scale: Good     Standing balance support: No upper extremity supported Standing balance-Leahy Scale: Good                             ADL either performed or assessed with clinical judgement   ADL Overall ADL's : Needs assistance/impaired Eating/Feeding: Independent Eating/Feeding Details (indicate cue type and reason): performing on entry                 Lower Body Dressing: Modified independent;Sitting/lateral leans Lower Body Dressing Details (indicate cue type and reason): sliding feet into shoes and adjusting back at EOB Toilet Transfer: Ambulation;Modified Independent           Functional mobility during ADLs: Modified independent General ADL Comments: continued challenge of attention, problem, solving, awareness, and vision with functional mobility into hall and with word search.    Extremity/Trunk Assessment Upper Extremity Assessment Upper Extremity Assessment: Overall WFL for tasks assessed   Lower Extremity Assessment Lower Extremity Assessment: Defer to PT evaluation        Vision   Additional Comments: Challenging vision/visual attention with word search. pt with good scanning technique top to bottom and L to R. Initially requiring assist to determine where first word he found (  middle, spelled backward) ended due to two words on page both beginning with "back" and word he found being second word on page. Pt continuously using L to R and top to bottom approach. pt able to find 4 words in 5-6 minutes. Only needing assist to circle first word due to  the confusion regarding which word he found.   Perception     Praxis      Cognition Arousal/Alertness: Awake/alert Behavior During Therapy: Flat affect Overall Cognitive Status: Impaired/Different from baseline Area of Impairment: Problem solving, Safety/judgement, Attention, Awareness                   Current Attention Level: Selective     Safety/Judgement: Decreased awareness of safety, Decreased awareness of deficits Awareness: Emergent Problem Solving: Slow processing          Exercises      Shoulder Instructions       General Comments Pt dislikes being in hall where he can be observed. Offered paper scrub shirt to go with pants and pt deferred    Pertinent Vitals/ Pain       Pain Assessment Pain Assessment: No/denies pain Pain Intervention(s): Limited activity within patient's tolerance, Monitored during session  Home Living                                          Prior Functioning/Environment              Frequency  Min 1X/week        Progress Toward Goals  OT Goals(current goals can now be found in the care plan section)  Progress towards OT goals: Progressing toward goals  Acute Rehab OT Goals Patient Stated Goal: none stated OT Goal Formulation: With patient Time For Goal Achievement: 10/07/22 Potential to Achieve Goals: Good ADL Goals Pt Will Transfer to Toilet: with modified independence;ambulating;regular height toilet Additional ADL Goal #1: Patient will complete bed mobility with min assist and maintain sitting balance at EOB with min guard for 5 minutes as precursor to ADLs.  Plan Discharge plan remains appropriate    Co-evaluation                 AM-PAC OT "6 Clicks" Daily Activity     Outcome Measure   Help from another person eating meals?: None Help from another person taking care of personal grooming?: None Help from another person toileting, which includes using toliet, bedpan, or  urinal?: A Little Help from another person bathing (including washing, rinsing, drying)?: A Little Help from another person to put on and taking off regular upper body clothing?: A Little Help from another person to put on and taking off regular lower body clothing?: A Little 6 Click Score: 20    End of Session    OT Visit Diagnosis: Other abnormalities of gait and mobility (R26.89);Muscle weakness (generalized) (M62.81);Pain;Other symptoms and signs involving cognitive function;Cognitive communication deficit (R41.841);Other symptoms and signs involving the nervous system (R29.898)   Activity Tolerance Patient tolerated treatment well   Patient Left in bed;with call bell/phone within reach;with bed alarm set   Nurse Communication Mobility status        Time: 1114-1140 OT Time Calculation (min): 26 min  Charges: OT General Charges $OT Visit: 1 Visit OT Treatments $Therapeutic Activity: 23-37 mins  Tyler Deis, OTR/L [facility] Acute Rehabilitation Office: [phone]   Myrla Halsted  09/30/2022, 1:52 PM

## 2022-09-30 NOTE — Progress Notes (Signed)
Daily Progress Note Intern Pager: [phone]  Patient name: Christopher Farrell Medical record number: [id number] Date of birth: Jul 28, 1960 Age: 62 y.o. Gender: male  Primary Care Provider: Pcp, No Consultants: Radiology, CCM, ID, neurology Code Status: Full   Pt Overview and Major Events to Date:  7/1: Admission and Intubation  7/2: Brain MRI - multifocal acute ischemia in R>L cerebral hemispheres and cerebellum  7/4: Extubated 7/5: Transferred to FMTS  7/8: TEE - MV multilobular vegetation  7/9: Prolonged IV Rocephin for 6 weeks total 7/10: Started Lexapro 10 mg 7/16: Brain MRI - similar artifact present need CT Head to r/o hemorrhage  7/17: CT head negative 7/19: repeated low BPs, started midodrine 5mg  7/21: Increased midodrine to 10 mg 7/24: Increase Lexapro to 20 mg   Assessment and Plan: Christopher Farrell is a 62 y.o. 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  Not a candidate for outpatient IV antibiotics. Pertinent PMH/PSH includes HIV not on antiretrovirals.   Patient remains clinically stable.  [facility]     * (Principal) Streptococcal bacteremia     Mitral valve vegetation due to S. Pneumo. - Continue Rocephin for total of 6 weeks (7/1-8/12), patient is  not a candidate for outpatient therapy due to lack of insurance and  history of IVDU. - Repeat TTE 8/5, per cards recs   Meningitis due to S. Pneumo. - STAT head CT for any change in neuro exam  - BMP weekly  - Follow up with TCTS after IV abx therapy or any issues/changes to his  valve         Anxiety and depression     Patient was started on Lexapro 10 mg 7/10.  He would like to increase  this. - Continue Lexapro 20 mg - consider adding wellbutrin if mood does not  respond        Severe malnutrition (HCC)     Weight this AM 111lbs, increasing from low of 108lbs. Patient reports  good appetite. - RD has seen pt, appreciate recs -Continue daily weights - regular diet - continue Ensure 3 times daily         Acute bacterial endocarditis     1.5 atrial mass with large vegetation of anterior mitral leaflet due to  S. Pneumo  - Continue Rocephin for total of 6 weeks (7/1-8/12), patient is not a  candidate for outpatient therapy due to lack of insurance and history of  IVDU.        Hypotension     BP consistently in the 80s/60s during hospitalization. -Continue midodrine 10mg  TID      Chronic, stable problems: HIV - continue Biktarvy.  Recheck HIV viral load around end of July. Diarrhea -Imodium as needed Right shoulder pain/stiffness - pt has Tylenol, voltaren gel, lidocaine patch, heating pad as needed   FEN/GI: Regular diet PPx: SCDs, out of bed Dispo: Pending inpatient completion of IV antibiotics on 8/12.  Not a candidate for outpatient IV therapy.  Subjective:  Seen this morning resting comfortably in bed.  He states he is doing well.  He is without complaint at this time  Objective: Temp:  [98.3 F (36.8 C)-98.5 F (36.9 C)] 98.5 F (36.9 C) (07/26 0720) Pulse Rate:  [84-88] 84 (07/26 1038) Resp:  [16-18] 16 (  07/26 0720) BP: (87-93)/(66-71) 93/71 (07/26 1038) SpO2:  [97 %-100 %] 100 % (07/26 1038) Physical Exam: General: No acute distress, pleasant Cardiovascular: RRR Respiratory: CTA bilaterally Abdomen: Soft, nontender.  Normoactive bowel sounds. Extremities: Moves all extremities equally  Laboratory: Most recent CBC Lab Results  Component Value Date   WBC 8.6 09/28/2022   HGB 9.2 (L) 09/28/2022   HCT 28.3 (L) 09/28/2022   MCV 91.6 09/28/2022   PLT 249 09/28/2022   Most recent BMP    Latest Ref Rng & Units 09/28/2022    4:45 AM  BMP  Glucose 70 - 99 mg/dL 98   BUN 8 - 23 mg/dL 16   Creatinine 4.09 - 1.24  mg/dL 8.11   Sodium 914 - 782 mmol/L 136   Potassium 3.5 - 5.1 mmol/L 4.2   Chloride 98 - 111 mmol/L 100   CO2 22 - 32 mmol/L 27   Calcium 8.9 - 10.3 mg/dL 8.8     Christopher Skeeters, DO 09/30/2022, 11:54 AM  PGY-1, [facility] [place] [facility] FPTS Intern pager: [phone], text pages welcome Secure chat group [facility] Teaching Service

## 2022-09-30 NOTE — Assessment & Plan Note (Signed)
Weight this AM 111lbs, increasing from low of 108lbs. Patient reports good appetite. - RD has seen pt, appreciate recs -Continue daily weights - regular diet - continue Ensure 3 times daily

## 2022-10-01 DIAGNOSIS — D649 Anemia, unspecified: Secondary | ICD-10-CM | POA: Insufficient documentation

## 2022-10-01 DIAGNOSIS — D638 Anemia in other chronic diseases classified elsewhere: Secondary | ICD-10-CM | POA: Diagnosis present

## 2022-10-01 LAB — TECHNOLOGIST SMEAR REVIEW: Plt Morphology: NORMAL

## 2022-10-01 NOTE — Assessment & Plan Note (Signed)
Mitral valve vegetation from S. Pneumo.  Patient continued to remain afebrile.  Not a good candidate for outpatient IV therapy due to lack of insurance.  Will remain inpatient to complete IV antibiotics. - Continue 6 weeks of Rocephin (7/1-8/12) -Continue routine vitals - Repeat TTE 8/5, per cards recs   Meningitis due to S. Pneumo.  No reported headaches and continued to remain afebrile. - STAT head CT for any change in neuro exam  - BMP weekly (next on 7/31) - Follow up with TCTS after IV abx therapy or any issues/changes to his valve

## 2022-10-01 NOTE — Assessment & Plan Note (Addendum)
Patient was started on Lexapro 10 mg 7/10.  Increased on 7/24. - Continue Lexapro 20 mg - Consider adding Wellbutrin if resistant after 4-6 weeks

## 2022-10-01 NOTE — Assessment & Plan Note (Signed)
Patient with good appetite and weight continues to improve. - RD following, last seen 7/25 - Continue daily weights - Regular diet - Continue Ensure 3 times daily - Mighty shakes 3 times daily with meals - Mighty cup 3 times daily with meals

## 2022-10-01 NOTE — Progress Notes (Signed)
Daily Progress Note Intern Pager: [phone]  Patient name: Christopher Farrell Medical record number: [id number] Date of birth: 1961-02-06 Age: 62 y.o. Gender: male  Primary Care Provider: Pcp, No Consultants: CCM, ID, neurology signed off Code Status: Full  Pt Overview and Major Events to Date:  7/1: Admission and Intubation  7/2: Brain MRI - multifocal acute ischemia in R>L cerebral hemispheres and cerebellum  7/4: Extubated 7/5: Transferred to FMTS  7/8: TEE - MV multilobular vegetation  7/9: Prolonged IV Rocephin for 6 weeks total 7/10: Started Lexapro 10 mg 7/16: Brain MRI - similar artifact present need CT Head to r/o hemorrhage  7/17: CT head negative 7/19: repeated low BPs, started midodrine 5mg  7/21: Increased midodrine to 10 mg 7/24: Increase Lexapro to 20 mg  Assessment and Plan:  Christopher Farrell is a 62 y.o. 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  Not a candidate for outpatient IV antibiotics. Pertinent PMH/PSH includes HIV not on antiretrovirals.   Christopher Farrell     * (Principal) Streptococcal bacteremia     Mitral valve vegetation due to S. Pneumo. - Continue Rocephin for total of 6 weeks (7/1-8/12), patient is  not a candidate for outpatient therapy due to lack of insurance and  history of IVDU. - Repeat TTE 8/5, per cards recs   Meningitis due to S. Pneumo. - STAT head CT for any change in neuro exam  - BMP weekly (next on 7/31) - Follow up with TCTS after IV abx therapy or any issues/changes to his  valve         Anxiety and depression     Patient was started on Lexapro 10 mg 7/10.  Increased on 7/24. - Continue Lexapro 20 mg - Consider adding Wellbutrin if resistant after 4-6 weeks        Protein-calorie  malnutrition, severe (HCC)     Weight has been increasing from low of 108lbs. Patient reports good  appetite. 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 Pneumo  - Continue Rocephin for total of 6 weeks (7/1-8/12), patient is not a  candidate for outpatient therapy due to lack of insurance and history of  IVDU.        Hypotension     BP 90s/60s last night. Has been low throughout hospitalization.  Asymptomatic. -Continue midodrine 10mg  TID        Anemia     Hemoglobin 9-10s within the hospital meds have been previously in the  12's prior to admission. Likely of chronic disease.  ferritin 614 (likely  d/t infection), B12 586, hemoglobin 9.6, smear pending.       Chronic, stable problems: HIV - continue Biktarvy.  Recheck HIV viral load end of July. Diarrhea -Imodium as needed Right shoulder pain/stiffness - pt has Tylenol, voltaren gel, lidocaine patch, heating pad as needed  FEN/GI: Regular PPx: Out of bed Dispo: Unable to go to SNF due to patient's history, unable to discharge home due to  IV drug use.  Here for IV antibiotics until 10/17/2022.  Subjective:  Patient says that he did not have any issues overnight.  States that he has mild right shoulder pain that he would like some Tylenol for otherwise is doing well.  Objective: Temp:  [97.7 F (36.5 C)-98.6 F (37 C)] 98.3 F (36.8 C) (07/27 0434) Pulse Rate:  [84-94] 93 (07/27 0434) Resp:  [16-18] 18 (07/27 0434) BP: (88-93)/(63-72) 88/72 (07/27 0434) SpO2:  [96 %-100 %] 99 % (07/27 0434) Physical Exam: General: NAD, awake, alert, responsive to all questions Cardiovascular: Regular rate and rhythm Respiratory: Clear to auscultation bilaterally, no wheezes  rales or crackles Abdomen: Soft, nontender to palpation Extremities: Well-perfused, warm  Laboratory: Most recent CBC Lab Results  Component Value Date   WBC 9.6 10/01/2022   HGB 9.6 (L) 10/01/2022   HCT 30.1 (L) 10/01/2022   MCV 93.5 10/01/2022   PLT 272 10/01/2022   Most recent BMP    Latest Ref Rng & Units 09/28/2022    4:45 AM  BMP  Glucose 70 - 99 mg/dL 98   BUN 8 - 23 mg/dL 16   Creatinine 1.61 - 1.24 mg/dL 0.96   Sodium 045 - 409 mmol/L 136   Potassium 3.5 - 5.1 mmol/L 4.2   Chloride 98 - 111 mmol/L 100   CO2 22 - 32 mmol/L 27   Calcium 8.9 - 10.3 mg/dL 8.8     Levin Erp, MD 10/01/2022, 5:54 AM  PGY-3, [place] [facility] FPTS Intern pager: [phone], text pages welcome Secure chat group [facility] Teaching Service

## 2022-10-01 NOTE — Assessment & Plan Note (Signed)
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 Pneumo  - Continue Rocephin for total of 6 weeks (7/1-8/12), patient is not a candidate for outpatient therapy due to lack of insurance and history of IVDU.

## 2022-10-01 NOTE — Assessment & Plan Note (Addendum)
BP has remained appropriate in the last 24 hours. - Continue midodrine 10mg  TID

## 2022-10-01 NOTE — Assessment & Plan Note (Addendum)
Weight has been increasing from low of 108lbs. Patient reports good appetite. - RD following, last seen 7/25 - Continue daily weights - Regular diet - Continue Ensure 3 times daily - Mighty shakes 3 times daily with meals - Mighty cup 3 times daily with meals

## 2022-10-01 NOTE — Plan of Care (Signed)
  Problem: Nutritional: Goal: Maintenance of adequate nutrition will improve Outcome: Progressing   Problem: Education: Goal: Knowledge of General Education information will improve Description: Including pain rating scale, medication(s)/side effects and non-pharmacologic comfort measures Outcome: Progressing   Problem: Nutrition: Goal: Adequate nutrition will be maintained Outcome: Progressing   Problem: Coping: Goal: Level of anxiety will decrease Outcome: Progressing

## 2022-10-01 NOTE — Assessment & Plan Note (Signed)
Stable on current dose. - Continue Lexapro 20 mg - Consider adding Wellbutrin if resistant after 4-6 weeks

## 2022-10-01 NOTE — Assessment & Plan Note (Signed)
BP 90s/60s last night. Has been low throughout hospitalization. Asymptomatic. -Continue midodrine 10mg  TID

## 2022-10-01 NOTE — Progress Notes (Signed)
Daily Progress Note Intern Pager: [phone]  Patient name: Christopher Farrell Medical record number: [id number] Date of birth: [birth year] Age: 62 y.o. Gender: male  Primary Care Provider: Pcp, No Consultants: CCM, ID, neuro signed off Code Status: Full  Pt Overview and Major Events to Date:  7/1: Admission and Intubation  7/2: Brain MRI - multifocal acute ischemia in R>L cerebral hemispheres and cerebellum  7/4: Extubated 7/5: Transferred to FMTS  7/8: TEE - MV multilobular vegetation  7/9: Prolonged IV Rocephin for 6 weeks total 7/10: Started Lexapro 10 mg 7/16: Brain MRI - similar artifact present need CT Head to r/o hemorrhage  7/17: CT head negative 7/19: repeated low BPs, started midodrine 5mg  7/21: Increased midodrine to 10 mg 7/24: Increase Lexapro to 20 mg  Assessment and Plan:  Christopher Farrell is a 62 year old male who presented with meningitis 2/2 Streptococcus pneumonia and found to have large vegetation on the anterior mitral valve leaflet with associated perforation and severe mitral insufficiency currently on 6 weeks of IV antibiotics.. Pertinent PMH/PSH includes HIV not on antiretroviral medications.[facility]     * (Principal) Streptococcal bacteremia     Mitral valve vegetation from S. Pneumo.  Patient continued to remain  afebrile.  Not a good candidate for outpatient IV therapy due to lack of  insurance.  Will remain inpatient to complete IV antibiotics. - Continue 6 weeks of Rocephin (7/1-8/12) -Continue routine vitals - Repeat TTE 8/5, per cards recs   Meningitis due to S. Pneumo.  No reported headaches and continued to  remain afebrile. - STAT head CT for any change in neuro exam  - BMP weekly (next on 7/31) - Follow up with TCTS after IV abx therapy or any issues/changes to his  valve         Anxiety and depression     Stable on current dose. - Continue Lexapro 20 mg - Consider adding Wellbutrin if resistant after 4-6 weeks         Protein-calorie malnutrition, severe (HCC)     Patient with good appetite and weight continues to improve. 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 Pneumo  - Continue Rocephin for total of 6 weeks (7/1-8/12), patient is not a  candidate for outpatient therapy due to lack of insurance and history of  IVDU.        Hypotension     BP has remained appropriate in the last 24 hours. -Continue midodrine 10mg  TID      FEN/GI: Regular PPx: Out of bed Dispo:Home  pending completion of IV antibiotics . Barriers include requiring IV antibiotics.   Subjective:  Christopher Farrell was awake and laying in bed comfortably.  He said he is doing fine and has no concerns.  Denies having any headache, vision changes or chest pain.  His only concern was carrying excused from work being that he would be in the hospital for a little while for IV antibiotics.  Informed patient we would give him letter for  work excuse prior to discharge.  Objective: Temp:  [98.1 F (36.7 C)-98.8 F (37.1 C)] 98.5 F (36.9 C) (07/28 0326)  Pulse Rate:  [88-97] 97 (07/27 2009) Resp:  [16-18] 18 (07/28 0326) BP: (85-97)/(65-75) 96/75 (07/28 0326) SpO2:  [96 %-97 %] 96 % (07/28 0326) Weight:  [54.1 kg-55.7 kg] 54.1 kg (07/28 0500) Physical Exam: General: Awake, NAD, well-appearing Cardiovascular: Regular rate and rhythm, well-perfused Respiratory: Clear breath sounds bilaterally, normal WOB on RA Abdomen: Soft, nondistended, nontender Extremities: No LE edema Neuro: ANO x 3, no focal neurological deficits.  Laboratory: Most recent CBC Lab Results  Component Value Date   WBC 9.6 10/01/2022   HGB 9.6 (L) 10/01/2022   HCT 30.1 (L) 10/01/2022   MCV 93.5  10/01/2022   PLT 272 10/01/2022   Most recent BMP    Latest Ref Rng & Units 09/28/2022    4:45 AM  BMP  Glucose 70 - 99 mg/dL 98   BUN 8 - 23 mg/dL 16   Creatinine 0.98 - 1.24 mg/dL 1.19   Sodium 147 - 829 mmol/L 136   Potassium 3.5 - 5.1 mmol/L 4.2   Chloride 98 - 111 mmol/L 100   CO2 22 - 32 mmol/L 27   Calcium 8.9 - 10.3 mg/dL 8.8     Imaging/Diagnostic Tests: No new images.  Jerre Simon, MD 10/02/2022, 6:22 AM  PGY-3, [facility] FPTS Intern pager: [phone], text pages welcome Secure chat group [facility] Teaching Service

## 2022-10-01 NOTE — Assessment & Plan Note (Signed)
Mitral valve vegetation due to S. Pneumo. - Continue Rocephin for total of 6 weeks (7/1-8/12), patient is  not a candidate for outpatient therapy due to lack of insurance and  history of IVDU. - Repeat TTE 8/5, per cards recs   Meningitis due to S. Pneumo. - STAT head CT for any change in neuro exam  - BMP weekly (next on 7/31) - Follow up with TCTS after IV abx therapy or any issues/changes to his valve

## 2022-10-01 NOTE — Assessment & Plan Note (Addendum)
Hemoglobin 9-10s within the hospital meds have been previously in the 12's prior to admission. Likely of chronic disease.  ferritin 614 (likely d/t infection), B12 586, hemoglobin 9.6, smear pending.

## 2022-10-01 NOTE — Progress Notes (Signed)
Patient's BP 85/65. Patient does not voice any concerns or complain of any symptoms at this time. Provider made aware-confirmed understanding. Patient's bed lowered and call bell within reach.

## 2022-10-02 DIAGNOSIS — F32A Depression, unspecified: Secondary | ICD-10-CM

## 2022-10-02 DIAGNOSIS — F419 Anxiety disorder, unspecified: Secondary | ICD-10-CM

## 2022-10-02 NOTE — Plan of Care (Signed)
  Problem: Education: Goal: Ability to describe self-care measures that may prevent or decrease complications (Diabetes Survival Skills Education) will improve Outcome: Progressing Goal: Individualized Educational Video(s) Outcome: Progressing   Problem: Coping: Goal: Ability to adjust to condition or change in health will improve Outcome: Progressing   Problem: Fluid Volume: Goal: Ability to maintain a balanced intake and output will improve Outcome: Progressing   Problem: Health Behavior/Discharge Planning: Goal: Ability to identify and utilize available resources and services will improve Outcome: Progressing Goal: Ability to manage health-related needs will improve Outcome: Progressing   Problem: Metabolic: Goal: Ability to maintain appropriate glucose levels will improve Outcome: Progressing   Problem: Nutritional: Goal: Maintenance of adequate nutrition will improve Outcome: Progressing Goal: Progress toward achieving an optimal weight will improve Outcome: Progressing   Problem: Skin Integrity: Goal: Risk for impaired skin integrity will decrease Outcome: Progressing   Problem: Tissue Perfusion: Goal: Adequacy of tissue perfusion will improve Outcome: Progressing   Problem: Education: Goal: Knowledge of General Education information will improve Description: Including pain rating scale, medication(s)/side effects and non-pharmacologic comfort measures Outcome: Progressing   Problem: Health Behavior/Discharge Planning: Goal: Ability to manage health-related needs will improve Outcome: Progressing   Problem: Clinical Measurements: Goal: Ability to maintain clinical measurements within normal limits will improve Outcome: Progressing Goal: Will remain free from infection Outcome: Progressing Goal: Diagnostic test results will improve Outcome: Progressing Goal: Respiratory complications will improve Outcome: Progressing Goal: Cardiovascular complication will  be avoided Outcome: Progressing   Problem: Activity: Goal: Risk for activity intolerance will decrease Outcome: Progressing   Problem: Nutrition: Goal: Adequate nutrition will be maintained Outcome: Progressing   Problem: Coping: Goal: Level of anxiety will decrease Outcome: Progressing   Problem: Elimination: Goal: Will not experience complications related to bowel motility Outcome: Progressing Goal: Will not experience complications related to urinary retention Outcome: Progressing   Problem: Safety: Goal: Ability to remain free from injury will improve Outcome: Progressing   Problem: Skin Integrity: Goal: Risk for impaired skin integrity will decrease Outcome: Progressing   Problem: Education: Goal: Knowledge of disease or condition will improve Outcome: Progressing Goal: Knowledge of secondary prevention will improve (MUST DOCUMENT ALL) Outcome: Progressing Goal: Knowledge of patient specific risk factors will improve Loraine Leriche N/A or DELETE if not current risk factor) Outcome: Progressing   Problem: Ischemic Stroke/TIA Tissue Perfusion: Goal: Complications of ischemic stroke/TIA will be minimized Outcome: Progressing   Problem: Coping: Goal: Will verbalize positive feelings about self Outcome: Progressing Goal: Will identify appropriate support needs Outcome: Progressing   Problem: Health Behavior/Discharge Planning: Goal: Ability to manage health-related needs will improve Outcome: Progressing Goal: Goals will be collaboratively established with patient/family Outcome: Progressing   Problem: Self-Care: Goal: Ability to participate in self-care as condition permits will improve Outcome: Progressing Goal: Verbalization of feelings and concerns over difficulty with self-care will improve Outcome: Progressing Goal: Ability to communicate needs accurately will improve Outcome: Progressing   Problem: Nutrition: Goal: Risk of aspiration will decrease Outcome:  Progressing Goal: Dietary intake will improve Outcome: Progressing

## 2022-10-03 NOTE — Assessment & Plan Note (Signed)
Hgb 9.6, Hct 30.1, MCV 93.5 - Monitor with weekly CBC

## 2022-10-03 NOTE — Assessment & Plan Note (Signed)
BP has remained appropriate in the last 24 hours. - Continue midodrine 10mg  TID

## 2022-10-03 NOTE — Assessment & Plan Note (Signed)
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 Pneumo  - Continue Rocephin for total of 6 weeks (7/1-8/12), patient is not a candidate for outpatient therapy due to lack of insurance and history of IVDU.

## 2022-10-03 NOTE — Progress Notes (Signed)
Daily Progress Note Intern Pager: [phone]  Patient name: Christopher Farrell Medical record number: [id number] Date of birth: May 01, 1960 Age: 62 y.o. Gender: male  Primary Care Provider: Pcp, No Consultants: CCM, ID, neuro signed off  Code Status: Full Code  Pt Overview and Major Events to Date:  7/1: Admission and Intubation  7/2: Brain MRI - multifocal acute ischemia in R>L cerebral hemispheres and cerebellum  7/4: Extubated 7/5: Transferred to FMTS  7/8: TEE - MV multilobular vegetation  7/9: Prolonged IV Rocephin for 6 weeks total 7/10: Started Lexapro 10 mg 7/16: Brain MRI - similar artifact present need CT Head to r/o hemorrhage  7/17: CT head negative 7/19: repeated low BPs, started midodrine 5mg  7/21: Increased midodrine to 10 mg 7/24: Increase Lexapro to 20 mg  Assessment and Plan: Christopher Farrell is a 62 year old male who presented with meningitis 2/2 Streptococcus pneumonia and found to have large vegetation on the anterior mitral valve leaflet with associated perforation and severe mitral insufficiency currently on 6 weeks of IV antibiotics.. Pertinent PMH/PSH includes HIV not on antiretroviral medications.  [facility]     * (Principal) Streptococcal bacteremia     Mitral valve vegetation from S. Pneumo.  Patient continued to remain  afebrile.  Not a good candidate for outpatient IV therapy due to lack of  insurance.  Will remain inpatient to complete IV antibiotics. - Continue 6 weeks of Rocephin (7/1-8/12) - Continue routine vitals - Repeat TTE 8/5, per cards recs   Meningitis due to S. Pneumo.  No reported headaches and continued to  remain afebrile. - STAT head CT for any change in neuro exam  - BMP weekly (next on 7/31) - Follow up with [facility] after IV abx therapy or any issues/changes to his  valve         Human immunodeficiency virus (HIV) disease (HCC)     Remains on Biktarvy 50-200-25 daily. - Rechecking HIV viral load on 7/31         Anxiety and depression     Stable on current dose. - Continue Lexapro 20 mg - Consider adding Wellbutrin if resistant after 4-6 weeks        Protein-calorie malnutrition, severe (HCC)     Patient with good appetite and weight continues to improve. 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 Pneumo  - Continue Rocephin for total of 6 weeks (7/1-8/12), patient is not a  candidate for outpatient therapy due to lack of insurance and history of  IVDU.        Hypotension     BP has remained appropriate in the last 24 hours. - Continue midodrine 10mg  TID        Anemia, chronic disease     Hgb 9.6, Hct 30.1, MCV 93.5 - Monitor with weekly CBC     FEN/GI: Regular PPx: OOB Dispo: pending completion of IV abx. Barriers including IV abx  Subjective:  [name] was awake and laying in bed comfortably. He was getting another IV access today.  He said he is doing fine and has no concerns.  Denies having any headache, vision changes or chest  pain.   Objective: Temp:  [98 F (36.7 C)-98.9 F (37.2 C)] 98 F (36.7 C) (07/29 0838) Pulse Rate:  [89-99] 89 (07/29 1202) Resp:  [16-20] 20 (07/29 0838) BP: (87-100)/(66-76) 87/68 (07/29 1202) SpO2:  [92 %-95 %] 95 % (07/29 0838) Weight:  [54.5 kg] 54.5 kg (07/29 0310) Physical Exam: General: Awake and alert, NAD, well-appearing Cardiovascular: RRR. Mitral regurg murmur Respiratory: CTAB. Normal WOB on RA. No wheezing, crackles, or rhonchi Abdomen: soft, non-tender, non-distended. Normoactive bowel sounds Extremities: no BLE edema  Laboratory: Most recent CBC Lab Results  Component Value Date   WBC 9.6 10/01/2022   HGB 9.6 (L) 10/01/2022   HCT 30.1 (L) 10/01/2022   MCV 93.5  10/01/2022   PLT 272 10/01/2022   Most recent BMP    Latest Ref Rng & Units 09/28/2022    4:45 AM  BMP  Glucose 70 - 99 mg/dL 98   BUN 8 - 23 mg/dL 16   Creatinine 4.09 - 1.24 mg/dL 8.11   Sodium 914 - 782 mmol/L 136   Potassium 3.5 - 5.1 mmol/L 4.2   Chloride 98 - 111 mmol/L 100   CO2 22 - 32 mmol/L 27   Calcium 8.9 - 10.3 mg/dL 8.8     Imaging/Diagnostic Tests: No new imaging.   Fortunato Curling, DO 10/03/2022, 12:52 PM PGY-1, [facility]  FPTS Intern pager: [phone], text pages welcome Secure chat group [facility] Teaching Service

## 2022-10-03 NOTE — Assessment & Plan Note (Addendum)
Remains on Biktarvy 50-200-25 daily. - Rechecking HIV viral load on 7/31

## 2022-10-03 NOTE — Assessment & Plan Note (Signed)
Patient with good appetite and weight continues to improve. - RD following, last seen 7/25 - Continue daily weights - Regular diet - Continue Ensure 3 times daily - Mighty shakes 3 times daily with meals - Mighty cup 3 times daily with meals

## 2022-10-03 NOTE — Assessment & Plan Note (Addendum)
Mitral valve vegetation from S. Pneumo.  Patient continued to remain afebrile.  Not a good candidate for outpatient IV therapy due to lack of insurance.  Will remain inpatient to complete IV antibiotics. - Continue 6 weeks of Rocephin (7/1-8/12) -Continue routine vitals - Repeat TTE 8/5, per cards recs   Meningitis due to S. Pneumo.  No reported headaches and continued to remain afebrile. - STAT head CT for any change in neuro exam  - BMP weekly (next on 7/31) - Follow up with [facility] after IV abx therapy or any issues/changes to his valve

## 2022-10-03 NOTE — Assessment & Plan Note (Signed)
Stable on current dose. - Continue Lexapro 20 mg - Consider adding Wellbutrin if resistant after 4-6 weeks

## 2022-10-03 NOTE — Progress Notes (Signed)
Id brief note   Lab Results  Component Value Date   WBC 9.6 10/01/2022   HGB 9.6 (L) 10/01/2022   HCT 30.1 (L) 10/01/2022   MCV 93.5 10/01/2022   PLT 272 10/01/2022   Last metabolic panel Lab Results  Component Value Date   GLUCOSE 98 09/28/2022   NA 136 09/28/2022   K 4.2 09/28/2022   CL 100 09/28/2022   CO2 27 09/28/2022   BUN 16 09/28/2022   CREATININE 0.67 09/28/2022   GFRNONAA >60 09/28/2022   CALCIUM 8.8 (L) 09/28/2022   PHOS 3.4 09/09/2022   PROT 6.9 09/28/2022   ALBUMIN 2.5 (L) 09/28/2022   BILITOT 0.2 (L) 09/28/2022   ALKPHOS 47 09/28/2022   AST 16 09/28/2022   ALT 16 09/28/2022   ANIONGAP 9 09/28/2022   No results found for: "CRP"   Patient finishing 3 more weeks ceftriaxone from 7/22 for 6 weeks abx treatment for 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

## 2022-10-03 NOTE — Plan of Care (Signed)
A/Ox4 and on room air. No complaints of pain this shift. Up with self care. No overnight issues.    Problem: Education: Goal: Ability to describe self-care measures that may prevent or decrease complications (Diabetes Survival Skills Education) will improve Outcome: Progressing Goal: Individualized Educational Video(s) Outcome: Progressing   Problem: Coping: Goal: Ability to adjust to condition or change in health will improve Outcome: Progressing   Problem: Fluid Volume: Goal: Ability to maintain a balanced intake and output will improve Outcome: Progressing   Problem: Health Behavior/Discharge Planning: Goal: Ability to identify and utilize available resources and services will improve Outcome: Progressing Goal: Ability to manage health-related needs will improve Outcome: Progressing   Problem: Metabolic: Goal: Ability to maintain appropriate glucose levels will improve Outcome: Progressing   Problem: Nutritional: Goal: Maintenance of adequate nutrition will improve Outcome: Progressing Goal: Progress toward achieving an optimal weight will improve Outcome: Progressing   Problem: Skin Integrity: Goal: Risk for impaired skin integrity will decrease Outcome: Progressing   Problem: Tissue Perfusion: Goal: Adequacy of tissue perfusion will improve Outcome: Progressing   Problem: Education: Goal: Knowledge of General Education information will improve Description: Including pain rating scale, medication(s)/side effects and non-pharmacologic comfort measures Outcome: Progressing   Problem: Health Behavior/Discharge Planning: Goal: Ability to manage health-related needs will improve Outcome: Progressing   Problem: Clinical Measurements: Goal: Ability to maintain clinical measurements within normal limits will improve Outcome: Progressing Goal: Will remain free from infection Outcome: Progressing Goal: Diagnostic test results will improve Outcome: Progressing Goal:  Respiratory complications will improve Outcome: Progressing Goal: Cardiovascular complication will be avoided Outcome: Progressing   Problem: Activity: Goal: Risk for activity intolerance will decrease Outcome: Progressing   Problem: Nutrition: Goal: Adequate nutrition will be maintained Outcome: Progressing   Problem: Coping: Goal: Level of anxiety will decrease Outcome: Progressing   Problem: Elimination: Goal: Will not experience complications related to bowel motility Outcome: Progressing Goal: Will not experience complications related to urinary retention Outcome: Progressing   Problem: Safety: Goal: Ability to remain free from injury will improve Outcome: Progressing   Problem: Skin Integrity: Goal: Risk for impaired skin integrity will decrease Outcome: Progressing   Problem: Education: Goal: Knowledge of disease or condition will improve Outcome: Progressing Goal: Knowledge of secondary prevention will improve (MUST DOCUMENT ALL) Outcome: Progressing Goal: Knowledge of patient specific risk factors will improve Loraine Leriche N/A or DELETE if not current risk factor) Outcome: Progressing   Problem: Ischemic Stroke/TIA Tissue Perfusion: Goal: Complications of ischemic stroke/TIA will be minimized Outcome: Progressing   Problem: Coping: Goal: Will verbalize positive feelings about self Outcome: Progressing Goal: Will identify appropriate support needs Outcome: Progressing   Problem: Health Behavior/Discharge Planning: Goal: Ability to manage health-related needs will improve Outcome: Progressing Goal: Goals will be collaboratively established with patient/family Outcome: Progressing   Problem: Self-Care: Goal: Ability to participate in self-care as condition permits will improve Outcome: Progressing Goal: Verbalization of feelings and concerns over difficulty with self-care will improve Outcome: Progressing Goal: Ability to communicate needs accurately will  improve Outcome: Progressing   Problem: Nutrition: Goal: Risk of aspiration will decrease Outcome: Progressing Goal: Dietary intake will improve Outcome: Progressing

## 2022-10-04 NOTE — Assessment & Plan Note (Signed)
Stable on current dose. - Continue Lexapro 20 mg - Consider adding Wellbutrin if resistant after 4-6 weeks

## 2022-10-04 NOTE — Discharge Instructions (Addendum)
Dear Christopher Farrell,   Thank you so much for allowing [place] to be part of your care!  You were admitted to [facility] for a blood stream infection that caused meningitis, endocarditis (infection of the valves of your heart) and cerebral emboli (pieces from the valve infection spread to your brain). You were also found to have a stroke. You were treated with IV antibiotics for 6 weeks in the hospital.   POST-HOSPITAL & CARE INSTRUCTIONS Please let PCP/Specialists know of any changes that were made.  Please see medications section of this packet for any medication changes. Please follow up with your PCP on 8/14 and he will help you set up appointments with infectious disease and a dentist. Your heart surgeon appointment is on 9/9 to talk about your valve surgery. Please take your anti-HIV meds as prescribed.  DOCTOR'S APPOINTMENT & FOLLOW UP CARE INSTRUCTIONS  Future Appointments  Date Time Provider Department Center  10/19/2022 10:30 AM Alicia Amel, MD [facility]  11/14/2022  4:30 PM Eugenio Hoes, MD [facility]    RETURN PRECAUTIONS: Headache, fevers, vision changes, persistent weakness or dizziness (particularly with standing)  Take care and be well!  Family Medicine Teaching Service  [place]  [facility]  [address] [place], [place] [address] [phone]

## 2022-10-04 NOTE — Assessment & Plan Note (Signed)
Hgb 9.6, Hct 30.1, MCV 93.5 - Monitor with weekly CBC

## 2022-10-04 NOTE — Plan of Care (Signed)
A/ox4 and on room air. Self care. No overnight issues.    Problem: Education: Goal: Ability to describe self-care measures that may prevent or decrease complications (Diabetes Survival Skills Education) will improve Outcome: Progressing Goal: Individualized Educational Video(s) Outcome: Progressing   Problem: Coping: Goal: Ability to adjust to condition or change in health will improve Outcome: Progressing   Problem: Fluid Volume: Goal: Ability to maintain a balanced intake and output will improve Outcome: Progressing   Problem: Health Behavior/Discharge Planning: Goal: Ability to identify and utilize available resources and services will improve Outcome: Progressing Goal: Ability to manage health-related needs will improve Outcome: Progressing   Problem: Metabolic: Goal: Ability to maintain appropriate glucose levels will improve Outcome: Progressing   Problem: Nutritional: Goal: Maintenance of adequate nutrition will improve Outcome: Progressing Goal: Progress toward achieving an optimal weight will improve Outcome: Progressing   Problem: Skin Integrity: Goal: Risk for impaired skin integrity will decrease Outcome: Progressing   Problem: Tissue Perfusion: Goal: Adequacy of tissue perfusion will improve Outcome: Progressing   Problem: Education: Goal: Knowledge of General Education information will improve Description: Including pain rating scale, medication(s)/side effects and non-pharmacologic comfort measures Outcome: Progressing   Problem: Health Behavior/Discharge Planning: Goal: Ability to manage health-related needs will improve Outcome: Progressing   Problem: Clinical Measurements: Goal: Ability to maintain clinical measurements within normal limits will improve Outcome: Progressing Goal: Will remain free from infection Outcome: Progressing Goal: Diagnostic test results will improve Outcome: Progressing Goal: Respiratory complications will  improve Outcome: Progressing Goal: Cardiovascular complication will be avoided Outcome: Progressing   Problem: Activity: Goal: Risk for activity intolerance will decrease Outcome: Progressing   Problem: Nutrition: Goal: Adequate nutrition will be maintained Outcome: Progressing   Problem: Coping: Goal: Level of anxiety will decrease Outcome: Progressing   Problem: Elimination: Goal: Will not experience complications related to bowel motility Outcome: Progressing Goal: Will not experience complications related to urinary retention Outcome: Progressing   Problem: Safety: Goal: Ability to remain free from injury will improve Outcome: Progressing   Problem: Skin Integrity: Goal: Risk for impaired skin integrity will decrease Outcome: Progressing   Problem: Education: Goal: Knowledge of disease or condition will improve Outcome: Progressing Goal: Knowledge of secondary prevention will improve (MUST DOCUMENT ALL) Outcome: Progressing Goal: Knowledge of patient specific risk factors will improve Loraine Leriche N/A or DELETE if not current risk factor) Outcome: Progressing   Problem: Ischemic Stroke/TIA Tissue Perfusion: Goal: Complications of ischemic stroke/TIA will be minimized Outcome: Progressing   Problem: Coping: Goal: Will verbalize positive feelings about self Outcome: Progressing Goal: Will identify appropriate support needs Outcome: Progressing   Problem: Health Behavior/Discharge Planning: Goal: Ability to manage health-related needs will improve Outcome: Progressing Goal: Goals will be collaboratively established with patient/family Outcome: Progressing   Problem: Self-Care: Goal: Ability to participate in self-care as condition permits will improve Outcome: Progressing Goal: Verbalization of feelings and concerns over difficulty with self-care will improve Outcome: Progressing Goal: Ability to communicate needs accurately will improve Outcome: Progressing    Problem: Nutrition: Goal: Risk of aspiration will decrease Outcome: Progressing Goal: Dietary intake will improve Outcome: Progressing

## 2022-10-04 NOTE — Assessment & Plan Note (Addendum)
Mitral valve vegetation from S. Pneumo.  Patient continued to remain afebrile.  Not a good candidate for outpatient IV therapy due to lack of insurance.  Will remain inpatient to complete IV antibiotics. - Continue 6 weeks of Rocephin (7/1-8/12) - Continue routine vitals - Repeat TEE 8/5, per cards recs   Meningitis due to S. Pneumo.  No reported headaches and continued to remain afebrile. - STAT head CT for any change in neuro exam  - BMP weekly (next on 7/31) - Follow up with [facility] after IV abx therapy or any issues/changes to his valve

## 2022-10-04 NOTE — Assessment & Plan Note (Signed)
BP has remained appropriate in the last 24 hours. - Continue midodrine 10mg  TID

## 2022-10-04 NOTE — Progress Notes (Signed)
Daily Progress Note Intern Pager: [phone]  Patient name: Christopher Farrell Medical record number: [id number] Date of birth: 08-Jun-1960 Age: 62 y.o. Gender: male  Primary Care Provider: Pcp, No Consultants: CCM, ID, Neuro signed off Code Status: Full Code  Pt Overview and Major Events to Date:  7/1: Admission and Intubation  7/2: Brain MRI - multifocal acute ischemia in R>L cerebral hemispheres and cerebellum  7/4: Extubated 7/5: Transferred to FMTS  7/8: TEE - MV multilobular vegetation  7/9: Prolonged IV Rocephin for 6 weeks total 7/10: Started Lexapro 10 mg 7/16: Brain MRI - similar artifact present need CT Head to r/o hemorrhage  7/17: CT head negative 7/19: repeated low BPs, started midodrine 5mg  7/21: Increased midodrine to 10 mg 7/24: Increase Lexapro to 20 mg  Assessment and Plan: Christopher Farrell is a 62 year old male who presented with meningitis 2/2 Streptococcus pneumonia and found to have large vegetation on the anterior mitral valve leaflet with associated perforation and severe mitral insufficiency currently on 6 weeks of IV antibiotics.  [facility]     * (Principal) Streptococcal bacteremia     Mitral valve vegetation from S. Pneumo.  Patient continued to remain  afebrile.  Not a good candidate for outpatient IV therapy due to lack of  insurance.  Will remain inpatient to complete IV antibiotics. - Continue 6 weeks of Rocephin (7/1-8/12) - Continue routine vitals - Repeat TEE 8/5, per cards recs   Meningitis due to S. Pneumo.  No reported headaches and continued to  remain afebrile. - STAT head CT for any change in neuro exam  - BMP weekly (next on 7/31) - Follow up with [facility] after IV abx therapy or any issues/changes to his  valve         Human immunodeficiency virus (HIV) disease (HCC)     Remains on Biktarvy 50-200-25 daily. - Rechecking HIV viral load on 7/31        Anxiety and depression     Stable on current dose. - Continue  Lexapro 20 mg - Consider adding Wellbutrin if resistant after 4-6 weeks        Protein-calorie malnutrition, severe (HCC)     Patient with good appetite and weight continues to improve. 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 Pneumo  - Continue Rocephin for total of 6 weeks (7/1-8/12), patient is not a  candidate for outpatient therapy due to lack of insurance and history of  IVDU. - TEE 8/5        Hypotension     BP has remained appropriate in the last 24 hours. - Continue midodrine 10mg  TID        Anemia, chronic disease     Hgb 9.6, Hct 30.1, MCV 93.5 - Monitor with weekly CBC      FEN/GI: Regular diet PPx: Out of bed Dispo: Pending completion of IV antibiotics.  Barriers include IV antibiotics  Subjective:  Is doing well this morning and laying in bed comfortably.  He reports that he gets up and walks around during the day.  No other complaints this morning.  Objective: Temp:  [98.1 F (36.7 C)-98.5  F (36.9 C)] 98.1 F (36.7 C) (07/30 0755) Pulse Rate:  [91-95] 92 (07/30 1112) Resp:  [18] 18 (07/30 0755) BP: (82-95)/(61-71) 82/61 (07/30 1112) SpO2:  [92 %-96 %] 92 % (07/30 0755) Weight:  [53.8 kg] 53.8 kg (07/30 0629) Physical Exam: General: Awake and alert, NAD, well appearing Cardiovascular: RRR.  Mitral regurg murmur Respiratory: CTAB.  Normal WOB on RA.  No wheezing, crackles or rhonchi Abdomen: Soft, nontender, nondistended.  Normoactive bowel sounds Extremities: No BLE edema  Laboratory: Most recent CBC Lab Results  Component Value Date   WBC 9.6 10/01/2022   HGB 9.6 (L) 10/01/2022   HCT 30.1 (L) 10/01/2022   MCV 93.5 10/01/2022   PLT 272 10/01/2022   Most recent BMP    Latest Ref  Rng & Units 09/28/2022    4:45 AM  BMP  Glucose 70 - 99 mg/dL 98   BUN 8 - 23 mg/dL 16   Creatinine 6.38 - 1.24 mg/dL 7.56   Sodium 433 - 295 mmol/L 136   Potassium 3.5 - 5.1 mmol/L 4.2   Chloride 98 - 111 mmol/L 100   CO2 22 - 32 mmol/L 27   Calcium 8.9 - 10.3 mg/dL 8.8    Imaging/Diagnostic Tests: No new imaging.  Fortunato Curling, DO 10/04/2022, 12:32 PM PGY-1, [facility]  FPTS Intern pager: [phone], text pages welcome Secure chat group [facility] Teaching Service

## 2022-10-04 NOTE — Assessment & Plan Note (Signed)
Patient with good appetite and weight continues to improve. - RD following, last seen 7/25 - Continue daily weights - Regular diet - Continue Ensure 3 times daily - Mighty shakes 3 times daily with meals - Mighty cup 3 times daily with meals

## 2022-10-04 NOTE — Assessment & Plan Note (Addendum)
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 Pneumo  - Continue Rocephin for total of 6 weeks (7/1-8/12), patient is not a candidate for outpatient therapy due to lack of insurance and history of IVDU. - TEE 8/5

## 2022-10-04 NOTE — Assessment & Plan Note (Signed)
Remains on Biktarvy 50-200-25 daily. - Rechecking HIV viral load on 7/31

## 2022-10-04 NOTE — Progress Notes (Signed)
Physical Therapy Treatment Patient Details Name: Christopher Farrell MRN: [id number] DOB: Mar 16, 1960 Today's Date: 10/04/2022   History of Present Illness Pt is a 62 y/o male presenting on 7/1 with cough and AMS. Noted presented to ED on 6/26 and found with multifocal pneumonia. Returned to ED after being found unresponsive, admitted with sepsis. 2 seizures in ED. CSF reveals bacterial meningitis. MRI with "multifocal acute/subacute ischemia within the right greater than left hemispheres and left cerebellum. Largest lesions in R parietal and occipital lobes". Intubated 7/1-7/3. PMH: HIV.    PT Comments  Pt greeted resting in bed and agreeable to session with steady progress towards acute goals. Pt able to progress gait distance without overt LOB, however pt continues to have multiple instances of getting L slipper caught on floor as pt with tendency to shuffle feet with slippers donned, however pt resistant to cues for increased safety awareness and improved gait mechanics. Pt continues to demonstrate lateral drift in hall and continues to be limited by decreased activity tolerance and impaired balance/postural reactions. Pt continues to benefit from skilled PT services to progress toward functional mobility goals.     If plan is discharge home, recommend the following: Direct supervision/assist for medications management;Direct supervision/assist for financial management;Assist for transportation;A little help with walking and/or transfers;A little help with bathing/dressing/bathroom   Can travel by private vehicle        Equipment Recommendations  None recommended by PT    Recommendations for Other Services       Precautions / Restrictions Precautions Precautions: Fall Restrictions Weight Bearing Restrictions: No     Mobility  Bed Mobility Overal bed mobility: Modified Independent Bed Mobility: Supine to Sit, Sit to Supine     Supine to sit: Modified independent (Device/Increase  time) Sit to supine: Modified independent (Device/Increase time)   General bed mobility comments: No assist needed.    Transfers Overall transfer level: Modified independent Equipment used: None Transfers: Sit to/from Stand Sit to Stand: Supervision           General transfer comment: Mod I to rise from bed, declines need for AD.    Ambulation/Gait Ambulation/Gait assistance: Supervision Gait Distance (Feet): 800 Feet Assistive device: None Gait Pattern/deviations: Step-through pattern, Wide base of support, Shuffle Gait velocity: decreased     General Gait Details: pt is mildly frustrated with being asked to walk and tends to shuffle in slippers with x3 instances of L slipper getting "caught" on floor and pt having to stop to adjust   Stairs             Wheelchair Mobility     Tilt Bed    Modified Rankin (Stroke Patients Only) Modified Rankin (Stroke Patients Only) Pre-Morbid Rankin Score: No symptoms Modified Rankin: Moderate disability     Balance Overall balance assessment: Needs assistance Sitting-balance support: Feet supported Sitting balance-Leahy Scale: Good Sitting balance - Comments: able to perform LB dressing seated on EOB Postural control: Posterior lean, Right lateral lean Standing balance support: No upper extremity supported Standing balance-Leahy Scale: Good Standing balance comment: one extremity support while standing at sink and mobility                            Cognition Arousal/Alertness: Awake/alert Behavior During Therapy: Flat affect Overall Cognitive Status: Impaired/Different from baseline Area of Impairment: Problem solving, Safety/judgement, Attention, Awareness  Current Attention Level: Selective Memory: Decreased recall of precautions, Decreased short-term memory Following Commands: Follows one step commands consistently Safety/Judgement: Decreased awareness of safety, Decreased  awareness of deficits Awareness: Emergent Problem Solving: Slow processing General Comments: pt flat throughout session, resistive to cues, conversation and questions        Exercises      General Comments        Pertinent Vitals/Pain Pain Assessment Pain Assessment: No/denies pain Pain Intervention(s): Monitored during session    Home Living                          Prior Function            PT Goals (current goals can now be found in the care plan section) Acute Rehab PT Goals PT Goal Formulation: With patient/family Time For Goal Achievement: 09/28/22 Progress towards PT goals: Progressing toward goals    Frequency    Min 1X/week      PT Plan      Co-evaluation              AM-PAC PT "6 Clicks" Mobility   Outcome Measure  Help needed turning from your back to your side while in a flat bed without using bedrails?: None Help needed moving from lying on your back to sitting on the side of a flat bed without using bedrails?: None Help needed moving to and from a bed to a chair (including a wheelchair)?: None Help needed standing up from a chair using your arms (e.g., wheelchair or bedside chair)?: None Help needed to walk in hospital room?: A Little Help needed climbing 3-5 steps with a railing? : A Little 6 Click Score: 22    End of Session   Activity Tolerance: Patient tolerated treatment well Patient left: with call bell/phone within reach;in bed Nurse Communication: Mobility status PT Visit Diagnosis: Unsteadiness on feet (R26.81);Other abnormalities of gait and mobility (R26.89);Muscle weakness (generalized) (M62.81);Difficulty in walking, not elsewhere classified (R26.2)     Time: 1211-1221 PT Time Calculation (min) (ACUTE ONLY): 10 min  Charges:    $Therapeutic Activity: 8-22 mins PT General Charges $$ ACUTE PT VISIT: 1 Visit                     Kandice Schmelter R. PTA Acute Rehabilitation Services Office: [phone]   Catalina Antigua 10/04/2022, 2:08 PM

## 2022-10-05 NOTE — Assessment & Plan Note (Signed)
Stable on current dose. - Continue Lexapro 20 mg - Consider adding Wellbutrin if resistant after 4-6 weeks

## 2022-10-05 NOTE — Progress Notes (Signed)
Occupational Therapy Treatment Patient Details Name: Christopher Farrell MRN: [id number] DOB: [birth year] Today's Date: 10/05/2022   History of present illness Pt is a 62 y/o male presenting on 7/1 with cough and AMS. Noted presented to ED on 6/26 and found with multifocal pneumonia. Returned to ED after being found unresponsive, admitted with sepsis. 2 seizures in ED. CSF reveals bacterial meningitis. MRI with "multifocal acute/subacute ischemia within the right greater than left hemispheres and left cerebellum. Largest lesions in R parietal and occipital lobes". Intubated 7/1-7/3. PMH: HIV.   OT comments  Pt with extremely flat affect and mild irritation on arrival. Offered session once earlier today and pt deferring but agreeable this session. Donning socks at EOB as well as slippers with mod I. Challenging ability to follow multistep commands with path finding task. Able to follow basic commands to go L outside of room, a first left around nurse station, and back to room. Increased time for problem solving during path finding, but no cues needed. Min decr attention to L, nearly running into several items, but is able to navigate around them in moderately cluttered hall. Attempted to engage in further ADL or visual activity, but pt deferring upon return to room. Continue to recommend HHOT to optimize safety and return to ADL and IADL in natural setting.    Recommendations for follow up therapy are one component of a multi-disciplinary discharge planning process, led by the attending physician.  Recommendations may be updated based on patient status, additional functional criteria and insurance authorization.    Assistance Recommended at Discharge Frequent or constant Supervision/Assistance  Patient can return home with the following  Assistance with cooking/housework;Direct supervision/assist for medications management;Direct supervision/assist for financial management;Assist for transportation    Equipment Recommendations  None recommended by OT    Recommendations for Other Services      Precautions / Restrictions Precautions Precautions: Fall Restrictions Weight Bearing Restrictions: No       Mobility Bed Mobility Overal bed mobility: Modified Independent                  Transfers Overall transfer level: Modified independent                       Balance Overall balance assessment: Needs assistance Sitting-balance support: Feet supported Sitting balance-Leahy Scale: Good Sitting balance - Comments: able to perform LB dressing seated on EOB   Standing balance support: No upper extremity supported Standing balance-Leahy Scale: Good Standing balance comment: one extremity support while standing at sink and mobility                           ADL either performed or assessed with clinical judgement   ADL Overall ADL's : Needs assistance/impaired                     Lower Body Dressing: Modified independent;Sitting/lateral leans Lower Body Dressing Details (indicate cue type and reason): to don socks and slippers             Functional mobility during ADLs: Modified independent      Extremity/Trunk Assessment              Vision   Additional Comments: Pt deferring any visual activities this session in regard to word search, pen and paper games (offered tic tac toe, etc), as well as ADL at sink, so challenged attention in hall. Pt frequently getting close to  items on L, but never bumping into items.   Perception Perception Perception: Impaired (Decr attention to L side of environment)   Praxis      Cognition Arousal/Alertness: Awake/alert Behavior During Therapy: Flat affect, Agitated (easily irritated this session; checked in 2 hours ago and refused, but agreeable upon return. Despite typical tendency for limited verbalizations, even more so this session) Overall Cognitive Status: Impaired/Different from  baseline Area of Impairment: Problem solving, Safety/judgement, Attention, Awareness                   Current Attention Level: Selective   Following Commands: Follows one step commands consistently Safety/Judgement: Decreased awareness of safety, Decreased awareness of deficits Awareness: Emergent Problem Solving: Slow processing General Comments: pt flat throughout session, resistive to cues, conversation and questions. Reports he is getting tired of being hospitalized and is ready for return to work. Improved demeanor with discussion of work and continued attempts of therapist to conversate regarding what motivates pt.        Exercises      Shoulder Instructions       General Comments      Pertinent Vitals/ Pain       Pain Assessment Pain Assessment: No/denies pain Pain Intervention(s): Monitored during session  Home Living                                          Prior Functioning/Environment              Frequency  Min 1X/week        Progress Toward Goals  OT Goals(current goals can now be found in the care plan section)  Progress towards OT goals: Progressing toward goals  Acute Rehab OT Goals Patient Stated Goal: get back to work OT Goal Formulation: With patient Time For Goal Achievement: 10/07/22 Potential to Achieve Goals: Good ADL Goals Pt Will Transfer to Toilet: with modified independence;ambulating;regular height toilet  Plan Discharge plan remains appropriate    Co-evaluation                 AM-PAC OT "6 Clicks" Daily Activity     Outcome Measure   Help from another person eating meals?: None Help from another person taking care of personal grooming?: None Help from another person toileting, which includes using toliet, bedpan, or urinal?: A Little Help from another person bathing (including washing, rinsing, drying)?: None Help from another person to put on and taking off regular upper body clothing?:  None Help from another person to put on and taking off regular lower body clothing?: None 6 Click Score: 23    End of Session    OT Visit Diagnosis: Other abnormalities of gait and mobility (R26.89);Muscle weakness (generalized) (M62.81);Pain;Other symptoms and signs involving cognitive function;Cognitive communication deficit (R41.841);Other symptoms and signs involving the nervous system (R29.898)   Activity Tolerance Patient tolerated treatment well   Patient Left with call bell/phone within reach;in chair   Nurse Communication Mobility status        Time: 1436-1450 OT Time Calculation (min): 14 min  Charges: OT General Charges $OT Visit: 1 Visit OT Treatments $Therapeutic Activity: 8-22 mins  Tyler Deis, OTR/L [facility] Acute Rehabilitation Office: [phone]   Myrla Halsted 10/05/2022, 2:59 PM

## 2022-10-05 NOTE — Assessment & Plan Note (Signed)
Mitral valve vegetation from S. Pneumo.  Patient continued to remain afebrile.  Not a good candidate for outpatient IV therapy due to lack of insurance.  Will remain inpatient to complete IV antibiotics. - Continue 6 weeks of Rocephin (7/1-8/12) - Continue routine vitals - Repeat TEE 8/5, per cards recs   Meningitis due to S. Pneumo.  No reported headaches and continued to remain afebrile. - STAT head CT for any change in neuro exam  - BMP weekly (next on 8/7) - Follow up with TCTS after IV abx therapy or any issues/changes to his valve

## 2022-10-05 NOTE — Progress Notes (Signed)
Nutrition Follow-up  DOCUMENTATION CODES:   Severe malnutrition in context of chronic illness  INTERVENTION:  Continue Ensure Plus High Protein po TID, each supplement provides 350 kcal and 20 grams of protein.   Continue Mighty Shake TID with meals, each supplement provides 330 kcals and 9 grams of protein   Continue Magic cup TID with meals, each supplement provides 290 kcal and 9 grams of protein   NUTRITION DIAGNOSIS:   Severe Malnutrition related to chronic illness (HIV) as evidenced by severe fat depletion, severe muscle depletion.  GOAL:   Patient will meet greater than or equal to 90% of their needs  MONITOR:   PO intake, Supplement acceptance, Diet advancement, Labs  REASON FOR ASSESSMENT:   Consult Assessment of nutrition requirement/status  ASSESSMENT:   Pt with hx of HIV (not compliant with antivirals) and EtOH/tobacco abuse presented to ED with AMS after recently being diagnosed with pneumonia. Found to be septic and then suffered a seizure in ED.  Meds reviewed: thiamine. Labs reviewed: WDL.   Pt ate 100% of his breakfast this am. Pt continues with good PO intakes. Weights stable this admission. Pt is meeting his needs at this time. Pt currently has Ensure TID ordered but has been refusing them over the past couple of days.   RD will continue to monitor PO intakes.   Diet Order:   Diet Order             Diet regular Room service appropriate? Yes with Assist; Fluid consistency: Thin  Diet effective now                   EDUCATION NEEDS:   Not appropriate for education at this time  Skin:  Skin Assessment: Reviewed RN Assessment  Last BM:  7/30 - type 7  Height:   Ht Readings from Last 1 Encounters:  09/13/22 5\' 4"  (1.626 m)    Weight:   Wt Readings from Last 1 Encounters:  10/04/22 53.8 kg    Ideal Body Weight:  59.1 kg  BMI:  Body mass index is 20.36 kg/m.  Estimated Nutritional Needs:   Kcal:  1700-2000 kcal/d  Protein:   85-100 g/d  Fluid:  >/=1.8L/d  Bethann Humble, RD, LDN, CNSC.

## 2022-10-05 NOTE — Progress Notes (Signed)
Id brief note    Awaiting to finish IE treatment and repeat tee Noncompliant with hiv treatment. Back on biktarvy -- repeat hiv rna ordered   -please reengage ID once tee available

## 2022-10-05 NOTE — Progress Notes (Signed)
Daily Progress Note Intern Pager: [phone]  Patient name: Christopher Farrell Medical record number: [id number] Date of birth: [birth year] Age: 62 y.o. Gender: male  Primary Care Provider: Pcp, No Consultants:  CCM, ID, Neuro signed off  Code Status: Full Code  Pt Overview and Major Events to Date:  7/1: Admission and Intubation  7/2: Brain MRI - multifocal acute ischemia in R>L cerebral hemispheres and cerebellum  7/4: Extubated 7/5: Transferred to FMTS  7/8: TEE - MV multilobular vegetation  7/9: Prolonged IV Rocephin for 6 weeks total 7/10: Started Lexapro 10 mg 7/16: Brain MRI - similar artifact present need CT Head to r/o hemorrhage  7/17: CT head negative 7/19: repeated low BPs, started midodrine 5mg  7/21: Increased midodrine to 10 mg 7/24: Increase Lexapro to 20 mg  Assessment and Plan: Christopher Farrell is a 62 year old male who presented with meningitis 2/2 Streptococcus pneumonia and found to have large vegetation on the anterior mitral valve leaflet with associated perforation and severe mitral insufficiency currently on 6 weeks of IV antibiotics.  [facility]     * (Principal) Streptococcal bacteremia     Mitral valve vegetation from S. Pneumo.  Patient continued to remain  afebrile.  Not a good candidate for outpatient IV therapy due to lack of  insurance.  Will remain inpatient to complete IV antibiotics. - Continue 6 weeks of Rocephin (7/1-8/12) - Continue routine vitals - Repeat TEE 8/5, per cards recs   Meningitis due to S. Pneumo.  No reported headaches and continued to  remain afebrile. - STAT head CT for any change in neuro exam  - BMP weekly (next on 8/7) - Follow up with TCTS after IV abx therapy or any issues/changes to his  valve         Human immunodeficiency virus (HIV) disease (HCC)     Remains on Biktarvy 50-200-25 daily. - HIV viral load pending        Anxiety and depression     Stable on current dose. - Continue Lexapro 20  mg - Consider adding Wellbutrin if resistant after 4-6 weeks        Protein-calorie malnutrition, severe (HCC)     Severe Malnutrition related to chronic illness (HIV) as evidenced by  severe fat depletion, severe muscle depletion. Patient with good appetite  and weight continues to improve.  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 Pneumo  - Continue Rocephin for total of 6 weeks (7/1-8/12), patient is not a  candidate for outpatient therapy due to lack of insurance and history of  IVDU. - TEE 8/5        Hypotension     BP has remained appropriate in the last 24 hours. - Continue midodrine 10mg  TID        Anemia, chronic disease     Hgb 9.6, Hct 30.1, MCV 93.5 - Monitor with weekly CBC      FEN/GI: Regular diet PPx: Out of bed Dispo: Pending completion of IV antibiotics.  Barriers include IV antibiotics  Subjective:  Patient is doing well this morning and laying in bed comfortably.  He reports that he gets up  and walks around during the day.  Patient complained of some neck pain this morning due to his pillow, will try a Lidocaine patch. He feels ready to go because nothing is wrong with him and we discussed why he has to finish his course of antibiotics in the hospital.   Objective: Temp:  [98 F (36.7 C)-99.1 F (37.3 C)] 98.7 F (37.1 C) (07/31 1043) Pulse Rate:  [94-97] 97 (07/31 1043) Resp:  [18] 18 (07/31 1043) BP: (86-93)/(40-81) 93/69 (07/31 1043) SpO2:  [91 %-98 %] 91 % (07/31 1043) Physical Exam: General: Awake and alert, NAD, well appearing Cardiovascular: RRR.  Mitral regurg murmur Respiratory: CTAB.  Normal WOB on RA.  No wheezing, crackles or rhonchi Abdomen: Soft, nontender, nondistended.   Normoactive bowel sounds Extremities: No BLE edema  Laboratory: Most recent CBC Lab Results  Component Value Date   WBC 9.3 10/05/2022   HGB 9.7 (L) 10/05/2022   HCT 30.0 (L) 10/05/2022   MCV 91.5 10/05/2022   PLT 289 10/05/2022   Most recent BMP    Latest Ref Rng & Units 10/05/2022    3:13 AM  BMP  Glucose 70 - 99 mg/dL 500   BUN 8 - 23 mg/dL 13   Creatinine 9.38 - 1.24 mg/dL 1.82   Sodium 993 - 716 mmol/L 137   Potassium 3.5 - 5.1 mmol/L 3.9   Chloride 98 - 111 mmol/L 102   CO2 22 - 32 mmol/L 24   Calcium 8.9 - 10.3 mg/dL 8.7     Imaging/Diagnostic Tests: No new imaging.  Fortunato Curling, DO 10/05/2022, 11:41 AM PGY-1, [facility]  FPTS Intern pager: [phone], text pages welcome Secure chat group [facility] Teaching Service

## 2022-10-05 NOTE — Assessment & Plan Note (Signed)
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 Pneumo  - Continue Rocephin for total of 6 weeks (7/1-8/12), patient is not a candidate for outpatient therapy due to lack of insurance and history of IVDU. - TEE 8/5

## 2022-10-05 NOTE — Assessment & Plan Note (Addendum)
Remains on Biktarvy 50-200-25 daily. - HIV viral load pending

## 2022-10-05 NOTE — Assessment & Plan Note (Signed)
Severe Malnutrition related to chronic illness (HIV) as evidenced by severe fat depletion, severe muscle depletion. Patient with good appetite and weight continues to improve.  - RD following, last seen 7/25 - Continue daily weights - Regular diet - Continue Ensure 3 times daily - Mighty shakes 3 times daily with meals - Mighty cup 3 times daily with meals

## 2022-10-05 NOTE — Assessment & Plan Note (Signed)
Hgb 9.6, Hct 30.1, MCV 93.5 - Monitor with weekly CBC

## 2022-10-05 NOTE — Assessment & Plan Note (Signed)
BP has remained appropriate in the last 24 hours. - Continue midodrine 10mg  TID

## 2022-10-05 NOTE — Plan of Care (Signed)
A/Ox4 and on room air. No complaints of pain this shift. Vitals stable. Up with self care. No overnight issues.    Problem: Education: Goal: Ability to describe self-care measures that may prevent or decrease complications (Diabetes Survival Skills Education) will improve Outcome: Progressing Goal: Individualized Educational Video(s) Outcome: Progressing   Problem: Coping: Goal: Ability to adjust to condition or change in health will improve Outcome: Progressing   Problem: Fluid Volume: Goal: Ability to maintain a balanced intake and output will improve Outcome: Progressing   Problem: Health Behavior/Discharge Planning: Goal: Ability to identify and utilize available resources and services will improve Outcome: Progressing Goal: Ability to manage health-related needs will improve Outcome: Progressing   Problem: Metabolic: Goal: Ability to maintain appropriate glucose levels will improve Outcome: Progressing   Problem: Nutritional: Goal: Maintenance of adequate nutrition will improve Outcome: Progressing Goal: Progress toward achieving an optimal weight will improve Outcome: Progressing   Problem: Skin Integrity: Goal: Risk for impaired skin integrity will decrease Outcome: Progressing   Problem: Tissue Perfusion: Goal: Adequacy of tissue perfusion will improve Outcome: Progressing   Problem: Education: Goal: Knowledge of General Education information will improve Description: Including pain rating scale, medication(s)/side effects and non-pharmacologic comfort measures Outcome: Progressing   Problem: Health Behavior/Discharge Planning: Goal: Ability to manage health-related needs will improve Outcome: Progressing   Problem: Clinical Measurements: Goal: Ability to maintain clinical measurements within normal limits will improve Outcome: Progressing Goal: Will remain free from infection Outcome: Progressing Goal: Diagnostic test results will improve Outcome:  Progressing Goal: Respiratory complications will improve Outcome: Progressing Goal: Cardiovascular complication will be avoided Outcome: Progressing   Problem: Activity: Goal: Risk for activity intolerance will decrease Outcome: Progressing   Problem: Nutrition: Goal: Adequate nutrition will be maintained Outcome: Progressing   Problem: Coping: Goal: Level of anxiety will decrease Outcome: Progressing   Problem: Elimination: Goal: Will not experience complications related to bowel motility Outcome: Progressing Goal: Will not experience complications related to urinary retention Outcome: Progressing   Problem: Safety: Goal: Ability to remain free from injury will improve Outcome: Progressing   Problem: Skin Integrity: Goal: Risk for impaired skin integrity will decrease Outcome: Progressing   Problem: Education: Goal: Knowledge of disease or condition will improve Outcome: Progressing Goal: Knowledge of secondary prevention will improve (MUST DOCUMENT ALL) Outcome: Progressing Goal: Knowledge of patient specific risk factors will improve Loraine Leriche N/A or DELETE if not current risk factor) Outcome: Progressing   Problem: Ischemic Stroke/TIA Tissue Perfusion: Goal: Complications of ischemic stroke/TIA will be minimized Outcome: Progressing   Problem: Coping: Goal: Will verbalize positive feelings about self Outcome: Progressing Goal: Will identify appropriate support needs Outcome: Progressing   Problem: Health Behavior/Discharge Planning: Goal: Ability to manage health-related needs will improve Outcome: Progressing Goal: Goals will be collaboratively established with patient/family Outcome: Progressing   Problem: Self-Care: Goal: Ability to participate in self-care as condition permits will improve Outcome: Progressing Goal: Verbalization of feelings and concerns over difficulty with self-care will improve Outcome: Progressing Goal: Ability to communicate needs  accurately will improve Outcome: Progressing   Problem: Nutrition: Goal: Risk of aspiration will decrease Outcome: Progressing Goal: Dietary intake will improve Outcome: Progressing
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
## 2022-10-06 NOTE — Assessment & Plan Note (Addendum)
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 Pneumo. Cards spoke with pt 8/1, pt has better understanding of medical condition and necessity of hospitalization now. - Continue Rocephin for total of 6 weeks (7/1-8/12), patient is not a candidate for outpatient therapy due to lack of insurance and history of IVDU. -Per cards: repeat TTE next week (around 8/7), will need evaluation by CT surgery if severe MR present. If mild MR, can order TEE. Call to schedule TTE on Monday (8/5)

## 2022-10-06 NOTE — Assessment & Plan Note (Signed)
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 Pneumo  - Continue Rocephin for total of 6 weeks (7/1-8/12), patient is not a candidate for outpatient therapy due to lack of insurance and history of IVDU. - Consult cards today to schedule TEE for 8/5

## 2022-10-06 NOTE — Progress Notes (Signed)
Daily Progress Note Intern Pager: [phone]  Patient name: Christopher Farrell Medical record number: [id number] Date of birth: 1960-10-17 Age: 62 y.o. Gender: male  Primary Care Provider: Pcp, No Consultants: ID, cardiology Code Status: FULL code  Pt Overview and Major Events to Date:  7/1: Admission and Intubation  7/2: Brain MRI - multifocal acute ischemia in R>L cerebral hemispheres and cerebellum  7/4: Extubated 7/5: Transferred to FMTS  7/8: TEE - MV multilobular vegetation  7/9: Prolonged IV Rocephin for 6 weeks total 7/10: Started Lexapro 10 mg 7/16: Brain MRI - similar artifact present need CT Head to r/o hemorrhage  7/17: CT head negative 7/19: repeated low BPs, started midodrine 5mg  7/21: Increased midodrine to 10 mg 7/24: Increase Lexapro to 20 mg  Assessment and Plan: Christopher Farrell is a 62 y.o. male who presented with streptococcus pneumonia meningitis and was found to have a large vegetation on the anterior mitral valve leaflet with associate perforation and severe mitral insufficiency currently on 6 weeks of IV antibiotics. [facility]     * (Principal) Streptococcal bacteremia     Mitral valve vegetation from S. Pneumo.  Patient continued to remain  afebrile.  Not a good candidate for outpatient IV therapy due to lack of  insurance.  Will remain inpatient to complete IV antibiotics. - Continue 6 weeks of Rocephin (7/1-8/12) - Continue routine vitals - Repeat TEE 8/5, per cards recs   Meningitis due to S. Pneumo.  No reported headaches and continued to  remain afebrile. - STAT head CT for any change in neuro exam  - BMP weekly (next on 8/7) - Follow up with TCTS after IV abx therapy or any issues/changes to his  valve         Human immunodeficiency virus (HIV) disease (HCC)     Remains on Biktarvy 50-200-25 daily. - HIV viral load pending        Anxiety and depression     Stable on current dose. - Continue Lexapro 20 mg - Consider adding  Wellbutrin if resistant after 4-6 weeks        Protein-calorie malnutrition, severe (HCC)     Severe Malnutrition related to chronic illness (HIV) as evidenced by  severe fat depletion, severe muscle depletion. Patient with good appetite  and weight continues to improve.  - RD following, last seen 7/25 - Pt weight stable at 53-55kg, d/c daily weights - Regular diet - Continue Ensure 3 times daily - Mighty shakes 3 times daily with meals - Mighty cup 3 times daily with meals        Acute bacterial endocarditis     1.5 atrial mass with large vegetation of anterior mitral leaflet due to  S. Pneumo  - Continue Rocephin for total of 6 weeks (7/1-8/12), patient is not a  candidate for outpatient therapy due to lack of insurance and history of  IVDU. - Consult cards today to schedule TEE for 8/5        Hypotension     BP has remained appropriate in the last 24 hours. - Continue midodrine 10mg  TID        Anemia, chronic disease     Hgb 9.6, Hct 30.1, MCV 93.5 - Monitor with weekly CBC       FEN/GI: regular PPx: out of bed Dispo: pending completion of IV antibiotics   Barriers include IV antibiotics.  Subjective:  Patient is out of bed in his room this morning.  States he is doing okay and wishes he could go home. Reviewed importance of completing antibiotics in the hospital. He is still having some neck pain.   Objective: Temp:  [97.8 F (36.6 C)-98.7 F (37.1 C)] 98.7 F (37.1 C) (08/01 0723) Pulse Rate:  [91-99] 99 (08/01 0723) Resp:  [18] 18 (08/01 0723) BP: (79-104)/(61-77) 87/61 (08/01 0723) SpO2:  [92 %-97 %] 94 % (08/01 0723) Weight:  [55.3 kg] 55.3 kg (08/01 0658) Physical Exam: General: Awake, alert, NAD, well appearing Cardiovascular: RRR Respiratory: CTAB. Normal WOB on RA, no wheezes, rales, or rhonchi Abdomen: Soft, nontender, nondistended.  Laboratory: Most recent CBC Lab Results  Component Value Date   WBC 9.3 10/05/2022   HGB 9.7 (L) 10/05/2022    HCT 30.0 (L) 10/05/2022   MCV 91.5 10/05/2022   PLT 289 10/05/2022   Most recent BMP    Latest Ref Rng & Units 10/05/2022    3:13 AM  BMP  Glucose 70 - 99 mg/dL 573   BUN 8 - 23 mg/dL 13   Creatinine 2.20 - 1.24 mg/dL 2.54   Sodium 270 - 623 mmol/L 137   Potassium 3.5 - 5.1 mmol/L 3.9   Chloride 98 - 111 mmol/L 102   CO2 22 - 32 mmol/L 24   Calcium 8.9 - 10.3 mg/dL 8.7      Imaging/Diagnostic Tests: No new imaging Lorayne Bender, MD 10/06/2022, 12:36 PM  PGY-1, [facility] FPTS Intern pager: [phone], text pages welcome Secure chat group [facility] Teaching Service

## 2022-10-06 NOTE — Assessment & Plan Note (Signed)
Severe Malnutrition related to chronic illness (HIV) as evidenced by severe fat depletion, severe muscle depletion. Patient with good appetite and weight continues to improve.  - RD following, last seen 7/25 - Pt weight stable at 53-55kg, d/c daily weights - Regular diet - Continue Ensure 3 times daily - Mighty shakes 3 times daily with meals - Mighty cup 3 times daily with meals

## 2022-10-06 NOTE — Plan of Care (Signed)
  Problem: Education: Goal: Ability to describe self-care measures that may prevent or decrease complications (Diabetes Survival Skills Education) will improve Outcome: Progressing Goal: Individualized Educational Video(s) Outcome: Progressing   Problem: Coping: Goal: Ability to adjust to condition or change in health will improve Outcome: Progressing   Problem: Fluid Volume: Goal: Ability to maintain a balanced intake and output will improve Outcome: Progressing   Problem: Health Behavior/Discharge Planning: Goal: Ability to identify and utilize available resources and services will improve Outcome: Progressing Goal: Ability to manage health-related needs will improve Outcome: Progressing   Problem: Metabolic: Goal: Ability to maintain appropriate glucose levels will improve Outcome: Progressing   Problem: Nutritional: Goal: Maintenance of adequate nutrition will improve Outcome: Progressing Goal: Progress toward achieving an optimal weight will improve Outcome: Progressing   Problem: Skin Integrity: Goal: Risk for impaired skin integrity will decrease Outcome: Progressing   Problem: Tissue Perfusion: Goal: Adequacy of tissue perfusion will improve Outcome: Progressing   Problem: Education: Goal: Knowledge of General Education information will improve Description: Including pain rating scale, medication(s)/side effects and non-pharmacologic comfort measures Outcome: Progressing   Problem: Health Behavior/Discharge Planning: Goal: Ability to manage health-related needs will improve Outcome: Progressing   Problem: Clinical Measurements: Goal: Ability to maintain clinical measurements within normal limits will improve Outcome: Progressing Goal: Will remain free from infection Outcome: Progressing Goal: Diagnostic test results will improve Outcome: Progressing Goal: Respiratory complications will improve Outcome: Progressing Goal: Cardiovascular complication will  be avoided Outcome: Progressing   Problem: Activity: Goal: Risk for activity intolerance will decrease Outcome: Progressing   Problem: Nutrition: Goal: Adequate nutrition will be maintained Outcome: Progressing   Problem: Coping: Goal: Level of anxiety will decrease Outcome: Progressing   Problem: Elimination: Goal: Will not experience complications related to bowel motility Outcome: Progressing Goal: Will not experience complications related to urinary retention Outcome: Progressing   Problem: Safety: Goal: Ability to remain free from injury will improve Outcome: Progressing   Problem: Skin Integrity: Goal: Risk for impaired skin integrity will decrease Outcome: Progressing   Problem: Education: Goal: Knowledge of disease or condition will improve Outcome: Progressing Goal: Knowledge of secondary prevention will improve (MUST DOCUMENT ALL) Outcome: Progressing Goal: Knowledge of patient specific risk factors will improve Loraine Leriche N/A or DELETE if not current risk factor) Outcome: Progressing   Problem: Ischemic Stroke/TIA Tissue Perfusion: Goal: Complications of ischemic stroke/TIA will be minimized Outcome: Progressing   Problem: Coping: Goal: Will verbalize positive feelings about self Outcome: Progressing Goal: Will identify appropriate support needs Outcome: Progressing   Problem: Health Behavior/Discharge Planning: Goal: Ability to manage health-related needs will improve Outcome: Progressing Goal: Goals will be collaboratively established with patient/family Outcome: Progressing   Problem: Self-Care: Goal: Ability to participate in self-care as condition permits will improve Outcome: Progressing Goal: Verbalization of feelings and concerns over difficulty with self-care will improve Outcome: Progressing Goal: Ability to communicate needs accurately will improve Outcome: Progressing   Problem: Nutrition: Goal: Risk of aspiration will decrease Outcome:  Progressing Goal: Dietary intake will improve Outcome: Progressing

## 2022-10-06 NOTE — Progress Notes (Signed)
Went to patient's bedside to discuss reason for hospitalization as patient has stated that he feels ready to go home.  Asked patient what his understanding of why he is hospitalized.  Patient stated that there is something wrong with his chest and he is getting medication for this.  He says he feels well and is getting up and walking around the room without issue.  Asked the patient to state more specifically why he is in the hospital and what type of medication he is receiving currently.  Patient again stated he believes he has a problem with his chest.  Explained to patient that he has a serious problem with his heart and he is currently receiving antibiotics to treat this.  Discussed with the patient the importance of staying in the hospital to complete antibiotics and patient was agreeable.  Discussed ways that patient could pass the time such as walking the halls, talking to family members and friends on the phone and doing crossword puzzles or drawing.  Patient says he enjoys walking the halls during the day.    Based on this conversation it is my opinion that this patient does not have capacity to make informed decisions based on his inconsistent understanding of why he is currently hospitalized and the importance of his treatment.

## 2022-10-06 NOTE — TOC CAGE-AID Note (Signed)
Transition of Care [facility]) - CAGE-AID Screening   Patient Details  Name: Christopher Farrell MRN: [id number] Date of Birth: 31-Jan-1961  Transition of Care [facility]) CM/SW Contact:    Janae Bridgeman, RN Phone Number: 10/06/2022, 12:04 PM   Clinical Narrative: CM met with the patient at the bedside to discuss substance abuse history and patient states that he last used crack cocaine 3 years ago and currently uses "weed".  OP substance abuse counseling offered and patient declined. \ Patient states that he only drinks "heavy liquor" socially and does "not have a problem".   CAGE-AID Screening: Substance Abuse Screening unable to be completed due to: : Patient Refused  Have You Ever Felt You Ought to Cut Down on Your Drinking or Drug Use?: No Have People Annoyed You By Critizing Your Drinking Or Drug Use?: No Have You Felt Bad Or Guilty About Your Drinking Or Drug Use?: No Have You Ever Had a Drink or Used Drugs First Thing In The Morning to Steady Your Nerves or to Get Rid of a Hangover?: No CAGE-AID Score: 0  Substance Abuse Education Offered: Yes (Patient declined need for OP counseling)  Substance abuse interventions: SDOH Screening

## 2022-10-06 NOTE — TOC Progression Note (Addendum)
Transition of Care [facility]) - Progression Note    Patient Details  Name: Christopher Farrell MRN: [id number] Date of Birth: [birth year]  Transition of Care [facility]) CM/SW Contact  Janae Bridgeman, RN Phone Number: 10/06/2022, 11:54 AM  Clinical Narrative:    CM met with the patient at the bedside to assist with TOC needs.  The patient remains inpatient through 10/17/2022 to receive IV antibiotics since patient has past history of crack cocaine use.  Patient declined OP counseling needs.  Resources placed in the AVS.  The patient lives with his mother in her home and plans to return to her home when he is discharged home by car.  The patient states that he was working at [employer] prior to admission to the hospital.  The patient states that his mother drives and is able to provide transportation.    Patient has pending Medicaid at this time.  Patient continuing to progress with PT.  I will place an Outpatient PT referral when patient is closer to discharge to home.  No DME is needed.  Patient will need discharge medications provided through [facility] pharmacy when he is discharged home on 10/17/22 with his mother.   Expected Discharge Plan: Home/Self Care Barriers to Discharge: Continued Medical Work up, Active Substance Use with PICC Line  Expected Discharge Plan and Services   Discharge Planning Services: CM Consult Post Acute Care Choice: Resumption of Svcs/PTA Provider Living arrangements for the past 2 months: Single Family Home                                       Social Determinants of Health (SDOH) Interventions SDOH Screenings   Food Insecurity: No Food Insecurity (09/13/2022)  Housing: Patient Declined (09/13/2022)  Transportation Needs: No Transportation Needs (09/13/2022)  Utilities: Not At Risk (09/13/2022)  Depression (PHQ2-9): High Risk (09/02/2022)  Tobacco Use: High Risk (09/13/2022)    Readmission Risk Interventions    10/06/2022   11:54 AM   Readmission Risk Prevention Plan  Transportation Screening Complete  PCP or Specialist Appt within 5-7 Days Complete  Home Care Screening Complete  Medication Review (RN CM) Complete

## 2022-10-07 ENCOUNTER — Inpatient Hospital Stay (HOSPITAL_COMMUNITY): Payer: Medicaid Other

## 2022-10-07 DIAGNOSIS — I340 Nonrheumatic mitral (valve) insufficiency: Secondary | ICD-10-CM

## 2022-10-07 DIAGNOSIS — R4182 Altered mental status, unspecified: Secondary | ICD-10-CM

## 2022-10-07 DIAGNOSIS — I059 Rheumatic mitral valve disease, unspecified: Secondary | ICD-10-CM

## 2022-10-07 DIAGNOSIS — I38 Endocarditis, valve unspecified: Secondary | ICD-10-CM

## 2022-10-07 NOTE — Assessment & Plan Note (Signed)
Severe Malnutrition related to chronic illness (HIV) as evidenced by severe fat depletion, severe muscle depletion.  - Weekly CBC, BMP, add Phos for Wednesday - RD following, last seen 7/25 - Pt weight stable at 53-55kg, d/c daily weights - Regular diet - Continue Ensure 3 times daily - Mighty shakes 3 times daily with meals - Mighty cup 3 times daily with meals

## 2022-10-07 NOTE — Assessment & Plan Note (Signed)
HIV load 8/1 showed quant of 40 copies of 1.602 (On 7/1 was 102K and 5.009 respectively). - Biktarvy 50-200-25 daily - ID follow-up outpatient

## 2022-10-07 NOTE — Assessment & Plan Note (Signed)
Mitral valve vegetation from S. Pneumo.  Patient continued to remain afebrile.  Not a good candidate for outpatient IV therapy due to lack of insurance.  Will remain inpatient to complete IV antibiotics. - Continue 6 weeks of Rocephin (7/1-8/12) - Continue routine vitals - Repeat TTE early next week (around 8/7), per cards recs   Meningitis due to S. Pneumo.  No reported headaches and continued to remain afebrile. - STAT head CT for any change in neuro exam  - BMP weekly (next on 8/7) - Follow up with TCTS after IV abx therapy or any issues/changes to his valve

## 2022-10-07 NOTE — Assessment & Plan Note (Signed)
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 Pneumo.  TTE 8/2 showed severe MVR, no vegetations seen on echocardiogram but could not be ruled out.  Plan per principal problem. - Continue Rocephin for total of 6 weeks (7/1-8/12) - CT surgery and cardiology following, awaiting catheterization on Monday 8/5

## 2022-10-07 NOTE — Assessment & Plan Note (Addendum)
Mitral valve vegetation from S. Pneumo.  Patient continued to remain afebrile.  Not a good candidate for outpatient IV therapy due to hx of IVDU.  TTE 8/1 showed severe PR, flail versus perforation of anterior leaflet.  No specific vegetations seen but this cannot be ruled out.  Cardiology to perform catheterization on Monday 8/5 to rule out CAD.  CT surgery will reassess on Monday 8/5 about valve replacement plans. Will remain inpatient to complete IV antibiotics. - Continue 6 weeks of Rocephin (7/1-8/12) - Continue routine vitals - Make n.p.o. at midnight 8/5 for cardiac catheterization - Cardiology and CT surgery following, appreciate recommendations  Meningitis due to S. Pneumo.  No reported headaches and continued to remain afebrile. - STAT head CT for any change in neuro exam  - BMP weekly (next on 8/7)

## 2022-10-07 NOTE — Assessment & Plan Note (Signed)
Stable on current dose. - Continue Lexapro 20 mg - Consider adding Wellbutrin if resistant after 4-6 weeks

## 2022-10-07 NOTE — Assessment & Plan Note (Signed)
BP has remained appropriate in the last 24 hours. - Continue midodrine 10mg  TID

## 2022-10-07 NOTE — Progress Notes (Signed)
Cardiologist:  Duke Salvia  Subjective:  Denies SSCP, palpitations or Dyspnea Spent long time trying to explain to him situation with bacteremia, vegetation, need for antibiotics and severe MR  Objective:  Vitals:   10/06/22 1627 10/06/22 2136 10/07/22 0645 10/07/22 0821  BP: 91/68 98/74 (!) 88/72 103/75  Pulse: 99 100 99 100  Resp: 18 18 18 18   Temp: 98.5 F (36.9 C) 98.5 F (36.9 C) 98.3 F (36.8 C) 97.6 F (36.4 C)  TempSrc: Oral Oral Oral   SpO2: 94% 94% 94% 94%  Weight:      Height:        Intake/Output from previous day:  Intake/Output Summary (Last 24 hours) at 10/07/2022 1150 Last data filed at 10/07/2022 0845 Gross per 24 hour  Intake 240 ml  Output 1 ml  Net 239 ml    Physical Exam:  Thin frail black male Lungs clear Loud apical MR murmur  Abdomen benign No edema No skin stigmata of SBE  Lab Results: Basic Metabolic Panel: Recent Labs    10/05/22 0313  NA 137  K 3.9  CL 102  CO2 24  GLUCOSE 109*  BUN 13  CREATININE 0.71  CALCIUM 8.7*   Liver Function Tests: No results for input(s): "AST", "ALT", "ALKPHOS", "BILITOT", "PROT", "ALBUMIN" in the last 72 hours. No results for input(s): "LIPASE", "AMYLASE" in the last 72 hours. CBC: Recent Labs    10/05/22 0313  WBC 9.3  HGB 9.7*  HCT 30.0*  MCV 91.5  PLT 289     Imaging: ECHOCARDIOGRAM COMPLETE  Result Date: 10/07/2022    ECHOCARDIOGRAM REPORT   Patient Name:   Christopher Farrell Date of Exam: 10/07/2022 Medical Rec #:  [id number]          Height:       64.0 in Accession #:    [id number]         Weight:       121.9 lb Date of Birth:  Jul 31, 1960          BSA:          1.585 m Patient Age:    61 years           BP:           88/72 mmHg Patient Gender: M                  HR:           98 bpm. Exam Location:  Inpatient Procedure: 2D Echo, Cardiac Doppler and Color Doppler Indications:    Endocarditis  History:        Patient has prior history of Echocardiogram examinations, most                  recent 09/06/2022.  Sonographer:    Harriette Bouillon RDCS Referring Phys: [id number] TIFFANY Bricelyn IMPRESSIONS  1. Left ventricular ejection fraction, by estimation, is 60 to 65%. The left ventricle has normal function. The left ventricle has no regional wall motion abnormalities. Left ventricular diastolic parameters are consistent with Grade III diastolic dysfunction (restrictive).  2. Right ventricular systolic function is normal. The right ventricular size is normal. There is severely elevated pulmonary artery systolic pressure. The estimated right ventricular systolic pressure is 65.8 mmHg.  3. Left atrial size was severely dilated.  4. The mitral valve is abnormal. The anterior leaflet has partial flail versus perforation. I do not see a definite vegetation but cannot rule out. Severe mitral valve regurgitation. No  evidence of mitral stenosis.  5. Tricuspid valve regurgitation is moderate.  6. The aortic valve is tricuspid. Aortic valve regurgitation is not visualized. No aortic stenosis is present.  7. The inferior vena cava is normal in size with <50% respiratory variability, suggesting right atrial pressure of 8 mmHg.  8. Suggest TEE evaluation of mitral regurgitation/?vegetation. FINDINGS  Left Ventricle: Left ventricular ejection fraction, by estimation, is 60 to 65%. The left ventricle has normal function. The left ventricle has no regional wall motion abnormalities. The left ventricular internal cavity size was normal in size. There is  no left ventricular hypertrophy. Left ventricular diastolic parameters are consistent with Grade III diastolic dysfunction (restrictive). Right Ventricle: The right ventricular size is normal. No increase in right ventricular wall thickness. Right ventricular systolic function is normal. There is severely elevated pulmonary artery systolic pressure. The tricuspid regurgitant velocity is 3.80 m/s, and with an assumed right atrial pressure of 8 mmHg, the estimated right  ventricular systolic pressure is 65.8 mmHg. Left Atrium: Left atrial size was severely dilated. Right Atrium: Right atrial size was normal in size. Pericardium: Trivial pericardial effusion is present. Mitral Valve: The mitral valve is abnormal. Severe mitral valve regurgitation. No evidence of mitral valve stenosis. Tricuspid Valve: The tricuspid valve is normal in structure. Tricuspid valve regurgitation is moderate. Aortic Valve: The aortic valve is tricuspid. Aortic valve regurgitation is not visualized. No aortic stenosis is present. Pulmonic Valve: The pulmonic valve was normal in structure. Pulmonic valve regurgitation is trivial. Aorta: The aortic root is normal in size and structure. Venous: The inferior vena cava is normal in size with less than 50% respiratory variability, suggesting right atrial pressure of 8 mmHg. IAS/Shunts: No atrial level shunt detected by color flow Doppler.  LEFT VENTRICLE PLAX 2D LVIDd:         5.30 cm   Diastology LVIDs:         3.60 cm   LV e' medial:    8.55 cm/s LV PW:         0.80 cm   LV E/e' medial:  16.3 LV IVS:        0.80 cm   LV e' lateral:   9.95 cm/s LVOT diam:     2.00 cm   LV E/e' lateral: 14.0 LV SV:         46 LV SV Index:   29 LVOT Area:     3.14 cm  RIGHT VENTRICLE             IVC RV S prime:     12.60 cm/s  IVC diam: 2.00 cm TAPSE (M-mode): 2.3 cm LEFT ATRIUM             Index        RIGHT ATRIUM           Index LA diam:        4.10 cm 2.59 cm/m   RA Area:     17.20 cm LA Vol (A2C):   71.3 ml 44.98 ml/m  RA Volume:   46.30 ml  29.21 ml/m LA Vol (A4C):   84.5 ml 53.31 ml/m LA Biplane Vol: 83.5 ml 52.67 ml/m  AORTIC VALVE LVOT Vmax:   104.00 cm/s LVOT Vmean:  65.400 cm/s LVOT VTI:    0.148 m  AORTA Ao Root diam: 3.40 cm Ao Asc diam:  3.40 cm MITRAL VALVE                TRICUSPID VALVE MV Area (  PHT): 5.62 cm     TR Peak grad:   57.8 mmHg MV Decel Time: 135 msec     TR Vmax:        380.00 cm/s MV E velocity: 139.00 cm/s MV A velocity: 54.50 cm/s   SHUNTS MV  E/A ratio:  2.55         Systemic VTI:  0.15 m                             Systemic Diam: 2.00 cm Dalton McleanMD Electronically signed by Wilfred Lacy Signature Date/Time: 10/07/2022/10:37:04 AM    Final     Cardiac Studies:  ECG: SR rate 88 normal    Telemetry:  Echo:   IMPRESSIONS     1. Left ventricular ejection fraction, by estimation, is 60 to 65%. The  left ventricle has normal function. The left ventricle has no regional  wall motion abnormalities. Left ventricular diastolic parameters are  consistent with Grade III diastolic  dysfunction (restrictive).   2. Right ventricular systolic function is normal. The right ventricular  size is normal. There is severely elevated pulmonary artery systolic  pressure. The estimated right ventricular systolic pressure is 65.8 mmHg.   3. Left atrial size was severely dilated.   4. The mitral valve is abnormal. The anterior leaflet has partial flail  versus perforation. I do not see a definite vegetation but cannot rule  out. Severe mitral valve regurgitation. No evidence of mitral stenosis.   5. Tricuspid valve regurgitation is moderate.   6. The aortic valve is tricuspid. Aortic valve regurgitation is not  visualized. No aortic stenosis is present.   7. The inferior vena cava is normal in size with <50% respiratory  variability, suggesting right atrial pressure of 8 mmHg.   8. Suggest TEE evaluation of mitral regurgitation/?vegetation.    Medications:    bictegravir-emtricitabine-tenofovir AF  1 tablet Oral Daily   Chlorhexidine Gluconate Cloth  6 each Topical Daily   diclofenac Sodium  4 g Topical QID   escitalopram  20 mg Oral Daily   feeding supplement  237 mL Oral TID BM   lidocaine  1 patch Transdermal Daily   melatonin  3 mg Oral QHS   midodrine  10 mg Oral TID WC   sodium chloride flush  10-40 mL Intracatheter Q12H   thiamine  100 mg Oral Daily      sodium chloride 250 mL (10/01/22 0905)   cefTRIAXone (ROCEPHIN)  IV 2  g (10/07/22 1016)    Assessment/Plan:   SBE:  mitral valve with vegetation partial flail and severe MR Strep pneumonia bacteremia complicated also by meningitis and multifocal septal CNS emboli  Has been on antibiotics since 09/05/22 BP has been low on midodrine 10 mg tid. No overt CHF. Will arrange for CVTS consult today or over weekend with Dr Leafy Ro and right/left heart cath Monday Although his mentation seems a bit slow he seems to understand the need for antibiotics reason for cath and risks and ultimate surgery  TEE done 09/12/22 showed vegetation on anterior leaflet 1.7 x 0.85 cm and pseudoaneurysm/perforation.  Will leave it up to discretion of CVTS wether they want TEE repeated prior to surgery Continue current iv antibiotics with Rocehpn  Shared Decision Making/Informed Consent The risks [stroke (1 in 1000), death (1 in 1000), kidney failure [usually temporary] (1 in 500), bleeding (1 in 200), allergic reaction [possibly serious] (1 in 200)], benefits (diagnostic support and  management of coronary artery disease) and alternatives of a cardiac catheterization were discussed in detail with Ms. Wanita Chamberlain and she is willing to proceed.   Charlton Haws 10/07/2022, 11:50 AM

## 2022-10-07 NOTE — Progress Notes (Signed)
Daily Progress Note Intern Pager: [phone]  Patient name: Christopher Farrell Medical record number: [id number] Date of birth: 16-Jul-1960 Age: 62 y.o. Gender: male  Primary Care Provider: Pcp, No Consultants: Cardiology, ID Code Status: Full code  Pt Overview and Major Events to Date:  7/1: Admission and Intubation  7/2: Brain MRI - multifocal acute ischemia in R>L cerebral hemispheres and cerebellum  7/4: Extubated 7/5: Transferred to FMTS  7/8: TEE - MV multilobular vegetation  7/9: Prolonged IV Rocephin for 6 weeks total 7/10: Started Lexapro 10 mg 7/16: Brain MRI - similar artifact present need CT Head to r/o hemorrhage  7/17: CT head negative 7/19: repeated low BPs, started midodrine 5mg  7/21: Increased midodrine to 10 mg 7/24: Increase Lexapro to 20 mg  Assessment and Plan:  Christopher Farrell is a 62 y.o. male who presented with streptococcus bacteremia with associated meningitis and MV endocarditis currently on 6 weeks of IV antibiotics inpatient due to IVDU hx. [facility]     * (Principal) Streptococcal bacteremia     Mitral valve vegetation from S. Pneumo.  Patient continued to remain  afebrile.  Not a good candidate for outpatient IV therapy due to hx of  IVDU.  TTE 8/1 showed severe PR, flail versus perforation of anterior  leaflet.  No specific vegetations seen but this cannot be ruled out.   Cardiology to perform catheterization on Monday 8/5 to rule out CAD.  CT  surgery will reassess on Monday 8/5 about valve replacement plans. Will  remain inpatient to complete IV antibiotics. - Continue 6 weeks of Rocephin (7/1-8/12) - Continue routine vitals - Make n.p.o. at midnight 8/5 for cardiac catheterization - Cardiology and CT surgery following, appreciate recommendations  Meningitis due to S. Pneumo.  No reported headaches and continued to  remain afebrile. - STAT head CT for any change in neuro exam  - BMP weekly (next on 8/7)        Human  immunodeficiency virus (HIV) disease (HCC)     HIV load 8/1 showed quant of 40 copies of 1.602 (On 7/1 was 102K and  5.009 respectively). - Biktarvy 50-200-25 daily - ID follow-up outpatient        Anxiety and depression     Stable on current dose. - Continue Lexapro 20 mg - Consider adding Wellbutrin if resistant after 4-6 weeks        Protein-calorie malnutrition, severe (HCC)     Severe Malnutrition related to chronic illness (HIV) as evidenced by  severe fat depletion, severe muscle depletion.  - Weekly CBC, BMP, add Phos for Wednesday - RD following, last seen 7/25 - Pt weight stable at 53-55kg, d/c daily weights - Regular diet - Continue Ensure 3 times daily - Mighty shakes 3 times daily with meals - Mighty cup 3 times daily with meals        Acute bacterial endocarditis     1.5 atrial mass with large vegetation of anterior mitral leaflet due to  S. Pneumo.  TTE 8/2 showed severe MVR, no vegetations seen on  echocardiogram but could not be ruled out.  Plan per principal problem. - Continue Rocephin for total of 6 weeks (7/1-8/12) - CT surgery and cardiology following, awaiting catheterization on Monday  8/5        Hypotension     BP has remained appropriate in the last 24 hours. - Continue midodrine 10mg  TID        Anemia, chronic disease  Hgb 9.7, Hct 30, MCV 91.5 - Monitor with weekly CBC      FEN/GI: Regular diet PPx: Out of bed to chair, OOB q4h while awake Dispo: Pending IV antibiotics course inpatient until 8/12, awaiting catheterization on Monday 8/5 and subsequent CT surgery recommendations after this for possible valve replacement  Subjective:  Patient states that he is doing well overnight.  He states that he has been eating well and denies any vomiting.  No acute complaints today.  Denies any pain anywhere.  Objective: Temp:  [97.6 F (36.4 C)-98.7 F (37.1 C)] 98.7 F (37.1 C) (08/02 1944) Pulse Rate:  [99-102] 101 (08/02 1944) Resp:   [18] 18 (08/02 1609) BP: (88-112)/(72-79) 104/79 (08/02 1944) SpO2:  [92 %-94 %] 92 % (08/02 1944) Physical Exam: General: NAD, awake, alert, responsive to all questions Cardiovascular: Regular rate, harsh systolic murmur present Respiratory: Clear to auscultation bilaterally, no wheezes rales or crackles, no increased work of breathing on room air Abdomen: Soft, nontender to palpation, nondistended Extremities: No lower extremity edema, no calf tenderness  Laboratory: Most recent CBC Lab Results  Component Value Date   WBC 9.3 10/05/2022   HGB 9.7 (L) 10/05/2022   HCT 30.0 (L) 10/05/2022   MCV 91.5 10/05/2022   PLT 289 10/05/2022   Most recent BMP    Latest Ref Rng & Units 10/05/2022    3:13 AM  BMP  Glucose 70 - 99 mg/dL 413   BUN 8 - 23 mg/dL 13   Creatinine 2.44 - 1.24 mg/dL 0.10   Sodium 272 - 536 mmol/L 137   Potassium 3.5 - 5.1 mmol/L 3.9   Chloride 98 - 111 mmol/L 102   CO2 22 - 32 mmol/L 24   Calcium 8.9 - 10.3 mg/dL 8.7     Levin Erp, MD 10/08/2022, 12:00 AM  PGY-3, [facility] FPTS Intern pager: [phone], text pages welcome Secure chat group [facility] Teaching Service

## 2022-10-07 NOTE — Plan of Care (Signed)
Problem: Education: Goal: Ability to describe self-care measures that may prevent or decrease complications (Diabetes Survival Skills Education) will improve Outcome: Progressing Goal: Individualized Educational Video(s) Outcome: Progressing   Problem: Coping: Goal: Ability to adjust to condition or change in health will improve Outcome: Progressing   Problem: Fluid Volume: Goal: Ability to maintain a balanced intake and output will improve Outcome: Progressing   Problem: Health Behavior/Discharge Planning: Goal: Ability to identify and utilize available resources and services will improve Outcome: Progressing Goal: Ability to manage health-related needs will improve Outcome: Progressing   Problem: Metabolic: Goal: Ability to maintain appropriate glucose levels will improve Outcome: Progressing   Problem: Nutritional: Goal: Maintenance of adequate nutrition will improve Outcome: Progressing Goal: Progress toward achieving an optimal weight will improve Outcome: Progressing   Problem: Skin Integrity: Goal: Risk for impaired skin integrity will decrease Outcome: Progressing   Problem: Tissue Perfusion: Goal: Adequacy of tissue perfusion will improve Outcome: Progressing   Problem: Education: Goal: Knowledge of General Education information will improve Description: Including pain rating scale, medication(s)/side effects and non-pharmacologic comfort measures Outcome: Progressing   Problem: Health Behavior/Discharge Planning: Goal: Ability to manage health-related needs will improve Outcome: Progressing   Problem: Clinical Measurements: Goal: Ability to maintain clinical measurements within normal limits will improve Outcome: Progressing Goal: Will remain free from infection Outcome: Progressing Goal: Diagnostic test results will improve Outcome: Progressing Goal: Respiratory complications will improve Outcome: Progressing Goal: Cardiovascular complication will  be avoided Outcome: Progressing   Problem: Activity: Goal: Risk for activity intolerance will decrease Outcome: Progressing   Problem: Nutrition: Goal: Adequate nutrition will be maintained Outcome: Progressing   Problem: Coping: Goal: Level of anxiety will decrease Outcome: Progressing   Problem: Elimination: Goal: Will not experience complications related to bowel motility Outcome: Progressing Goal: Will not experience complications related to urinary retention Outcome: Progressing   Problem: Safety: Goal: Ability to remain free from injury will improve Outcome: Progressing   Problem: Skin Integrity: Goal: Risk for impaired skin integrity will decrease Outcome: Progressing   Problem: Education: Goal: Knowledge of disease or condition will improve Outcome: Progressing Goal: Knowledge of secondary prevention will improve (MUST DOCUMENT ALL) Outcome: Progressing Goal: Knowledge of patient specific risk factors will improve Loraine Leriche N/A or DELETE if not current risk factor) Outcome: Progressing   Problem: Ischemic Stroke/TIA Tissue Perfusion: Goal: Complications of ischemic stroke/TIA will be minimized Outcome: Progressing   Problem: Coping: Goal: Will verbalize positive feelings about self Outcome: Progressing Goal: Will identify appropriate support needs Outcome: Progressing   Problem: Health Behavior/Discharge Planning: Goal: Ability to manage health-related needs will improve Outcome: Progressing Goal: Goals will be collaboratively established with patient/family Outcome: Progressing   Problem: Self-Care: Goal: Ability to participate in self-care as condition permits will improve Outcome: Progressing Goal: Verbalization of feelings and concerns over difficulty with self-care will improve Outcome: Progressing Goal: Ability to communicate needs accurately will improve Outcome: Progressing   Problem: Nutrition: Goal: Risk of aspiration will decrease Outcome:  Progressing Goal: Dietary intake will improve Outcome: Progressing   Problem: Education: Goal: Understanding of CV disease, CV risk reduction, and recovery process will improve Outcome: Progressing Goal: Individualized Educational Video(s) Outcome: Progressing   Problem: Activity: Goal: Ability to return to baseline activity level will improve Outcome: Progressing   Problem: Cardiovascular: Goal: Ability to achieve and maintain adequate cardiovascular perfusion will improve Outcome: Progressing Goal: Vascular access site(s) Level 0-1 will be maintained Outcome: Progressing   Problem: Health Behavior/Discharge Planning: Goal: Ability to safely manage health-related needs after  discharge will improve Outcome: Progressing

## 2022-10-07 NOTE — Progress Notes (Signed)
Daily Progress Note Intern Pager: [phone]  Patient name: Christopher Farrell Medical record number: [id number] Date of birth: [birth year] Age: 62 y.o. Gender: male  Primary Care Provider: Pcp, No Consultants: cardiology, ID Code Status: full code  Pt Overview and Major Events to Date:  7/1: Admission and Intubation  7/2: Brain MRI - multifocal acute ischemia in R>L cerebral hemispheres and cerebellum  7/4: Extubated 7/5: Transferred to FMTS  7/8: TEE - MV multilobular vegetation  7/9: Prolonged IV Rocephin for 6 weeks total 7/10: Started Lexapro 10 mg 7/16: Brain MRI - similar artifact present need CT Head to r/o hemorrhage  7/17: CT head negative 7/19: repeated low BPs, started midodrine 5mg  7/21: Increased midodrine to 10 mg 7/24: Increase Lexapro to 20 mg  Assessment and Plan: Christopher Farrell is a 62 y.o. male who presented with streptococcus pneumonia meningitis and was found to have a large vegetation on the anterior mitral valve leaflet with associated perforation and severe mitral insufficiency currently on 6 weeks of IV antibiotics   -      Hospital     * (Principal) Streptococcal bacteremia     Mitral valve vegetation from S. Pneumo.  Patient continued to remain  afebrile.  Not a good candidate for outpatient IV therapy due to lack of  insurance.  Will remain inpatient to complete IV antibiotics. - Continue 6 weeks of Rocephin (7/1-8/12) - Continue routine vitals - Repeat TTE early next week (around 8/7), per cards recs   Meningitis due to S. Pneumo.  No reported headaches and continued to  remain afebrile. - STAT head CT for any change in neuro exam  - BMP weekly (next on 8/7) - Follow up with [facility] after IV abx therapy or any issues/changes to his  valve         Human immunodeficiency virus (HIV) disease (HCC)     Remains on Biktarvy 50-200-25 daily. - HIV viral load pending        Anxiety and depression     Stable on current dose. - Continue  Lexapro 20 mg - Consider adding Wellbutrin if resistant after 4-6 weeks        Protein-calorie malnutrition, severe (HCC)     Severe Malnutrition related to chronic illness (HIV) as evidenced by  severe fat depletion, severe muscle depletion. Patient with good appetite  and weight continues to improve.  - RD following, last seen 7/25 - Pt weight stable at 53-55kg, d/c daily weights - Regular diet - Continue Ensure 3 times daily - Mighty shakes 3 times daily with meals - Mighty cup 3 times daily with meals        Acute bacterial endocarditis     1.5 atrial mass with large vegetation of anterior mitral leaflet due to  S. Pneumo. Cards spoke with pt 8/1, pt has better understanding of medical  condition and necessity of hospitalization now. - Continue Rocephin for total of 6 weeks (7/1-8/12), patient is not a  candidate for outpatient therapy due to lack of insurance and history of  IVDU. -Per cards: repeat TTE next week (around 8/7), will need evaluation by CT  surgery if severe MR present. If mild MR, can order TEE. Call to schedule  TTE on Monday (8/5)        Hypotension     BP has remained appropriate in the last 24 hours. - Continue midodrine 10mg  TID        Anemia, chronic disease     Hgb  9.7, Hct 30, MCV 91.5 - Monitor with weekly CBC       FEN/GI: regular PPx: ambulatory Dispo:pending completion of ABX and cards f/u   Subjective:  Doing well this morning. Spoke with cardiology team yesterday and feels he has a better understanding of why he is in the hospital. Ate eggs for breakfast. No questions or concerns at this time.  Objective: Temp:  [97.6 F (36.4 C)-98.5 F (36.9 C)] 97.6 F (36.4 C) (08/02 0821) Pulse Rate:  [99-100] 100 (08/02 0821) Resp:  [18] 18 (08/02 0821) BP: (88-103)/(68-75) 103/75 (08/02 0821) SpO2:  [94 %] 94 % (08/02 1610)  Physical Exam Constitutional:      Appearance: Normal appearance.  Cardiovascular:     Rate and Rhythm:  Normal rate and regular rhythm.     Heart sounds: Murmur heard.     Comments: Mitral regurgitation murmur Neurological:     Mental Status: He is alert.     Laboratory: Most recent CBC Lab Results  Component Value Date   WBC 9.3 10/05/2022   HGB 9.7 (L) 10/05/2022   HCT 30.0 (L) 10/05/2022   MCV 91.5 10/05/2022   PLT 289 10/05/2022   Most recent BMP    Latest Ref Rng & Units 10/05/2022    3:13 AM  BMP  Glucose 70 - 99 mg/dL 960   BUN 8 - 23 mg/dL 13   Creatinine 4.54 - 1.24 mg/dL 0.98   Sodium 119 - 147 mmol/L 137   Potassium 3.5 - 5.1 mmol/L 3.9   Chloride 98 - 111 mmol/L 102   CO2 22 - 32 mmol/L 24   Calcium 8.9 - 10.3 mg/dL 8.7      Imaging/Diagnostic Tests: No new imaging  Lorayne Bender, MD 10/07/2022, 9:31 AM  PGY-1, [facility] FPTS Intern pager: [phone], text pages welcome Secure chat group [facility] Teaching Service
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
## 2022-10-07 NOTE — Progress Notes (Signed)
  Echocardiogram 2D Echocardiogram has been performed.  Leda Roys RDCS 10/07/2022, 10:13 AM

## 2022-10-07 NOTE — Progress Notes (Addendum)
Speech Language Pathology Treatment: Cognitive-Linquistic  Patient Details Name: Christopher Farrell MRN: [id number] DOB: 30-Jul-1960 Today's Date: 10/07/2022 Time: 4782-9562 SLP Time Calculation (min) (ACUTE ONLY): 9 min  Assessment / Plan / Recommendation Clinical Impression  Pt voiced unwillingness to participate in activity meant to simulate ordering food at a restaurant. SLP provided redirection and reinforcement of receiving ST for difficulties with cognition noted in initial evaluation. Pt states that he cannot do any activities while in the hospital that he will be expected to perform following d/c and does not wish to continue with ST. SLP explained role in practicing functional activities in order to prepare for life with new differences in cognition following d/c, discussed returning to work, and caring for his mother, all of which he is adamant that he can do without difficulty. Due to pt's limited participation in prior sessions and continued hesitance to participate in therapy activities, will s/o acutely. Recommend pt follow up with [facility] SLP PRN after d/c to target primarily pragmatic difficulties with initiation, affect, and delayed response time.    HPI HPI: Pt is a 62 y/o male presenting on 7/1 with cough and AMS. 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 Dx infective endocarditis - will require six weeks of abx, ending 8/12. Intubated 7/1-3.  MRI with "multifocal acute/subacute ischemia within the right greater than left hemispheres and left cerebellum. Largest lesions in R parietal and occipital lobes".  PMH: HIV.      SLP Plan  Discharge SLP treatment due to (comment) (pt with limited participation and does not wish to participate in continued ST)      Recommendations for follow up therapy are one component of a multi-disciplinary discharge planning process, led by the attending physician.  Recommendations may be updated based on patient status,  additional functional criteria and insurance authorization.    Recommendations                     Oral care BID   Set up Supervision/Assistance Cognitive communication deficit (R41.841)     Discharge SLP treatment due to (comment) (pt with limited participation and does not wish to participate in continued ST)     Gwynneth Aliment, M.A., CF-SLP Speech Language Pathology, Acute Rehabilitation Services  Secure Chat preferred [phone]   10/07/2022, 11:30 AM

## 2022-10-07 NOTE — Progress Notes (Signed)
OT Cancellation Note  Patient Details Name: Christopher Farrell MRN: [id number] DOB: [birth year]   Cancelled Treatment:    Reason Eval/Treat Not Completed: Patient declined, no reason specified (Pt up finishing with bath at sink on arrival without supervision. Reporting he does not feel up to therapy today and requesting return another day.)  Tyler Deis, OTR/L [facility] Acute Rehabilitation Office: [phone]   Myrla Halsted 10/07/2022, 3:26 PM

## 2022-10-07 NOTE — Assessment & Plan Note (Signed)
Hgb 9.7, Hct 30, MCV 91.5 - Monitor with weekly CBC

## 2022-10-08 ENCOUNTER — Inpatient Hospital Stay (HOSPITAL_COMMUNITY): Payer: Medicaid Other

## 2022-10-08 MED ORDER — ASPIRIN 81 MG PO CHEW
81.0000 mg | CHEWABLE_TABLET | ORAL | Status: AC
  Administered 2022-10-09: 81 mg via ORAL
  Filled 2022-10-08: qty 1

## 2022-10-08 MED ORDER — SODIUM CHLORIDE 0.9 % WEIGHT BASED INFUSION
1.0000 mL/kg/h | INTRAVENOUS | Status: DC
  Administered 2022-10-09 (×2): 1 mL/kg/h via INTRAVENOUS

## 2022-10-08 MED ORDER — SODIUM CHLORIDE 0.9 % WEIGHT BASED INFUSION: 3.0000 mL/kg/h | INTRAVENOUS | Status: AC

## 2022-10-08 NOTE — Plan of Care (Signed)
Problem: Education: Goal: Ability to describe self-care measures that may prevent or decrease complications (Diabetes Survival Skills Education) will improve Outcome: Progressing Goal: Individualized Educational Video(s) Outcome: Progressing   Problem: Coping: Goal: Ability to adjust to condition or change in health will improve Outcome: Progressing   Problem: Fluid Volume: Goal: Ability to maintain a balanced intake and output will improve Outcome: Progressing   Problem: Health Behavior/Discharge Planning: Goal: Ability to identify and utilize available resources and services will improve Outcome: Progressing Goal: Ability to manage health-related needs will improve Outcome: Progressing   Problem: Metabolic: Goal: Ability to maintain appropriate glucose levels will improve Outcome: Progressing   Problem: Nutritional: Goal: Maintenance of adequate nutrition will improve Outcome: Progressing Goal: Progress toward achieving an optimal weight will improve Outcome: Progressing   Problem: Skin Integrity: Goal: Risk for impaired skin integrity will decrease Outcome: Progressing   Problem: Tissue Perfusion: Goal: Adequacy of tissue perfusion will improve Outcome: Progressing   Problem: Education: Goal: Knowledge of General Education information will improve Description: Including pain rating scale, medication(s)/side effects and non-pharmacologic comfort measures Outcome: Progressing   Problem: Health Behavior/Discharge Planning: Goal: Ability to manage health-related needs will improve Outcome: Progressing   Problem: Clinical Measurements: Goal: Ability to maintain clinical measurements within normal limits will improve Outcome: Progressing Goal: Will remain free from infection Outcome: Progressing Goal: Diagnostic test results will improve Outcome: Progressing Goal: Respiratory complications will improve Outcome: Progressing Goal: Cardiovascular complication will  be avoided Outcome: Progressing   Problem: Activity: Goal: Risk for activity intolerance will decrease Outcome: Progressing   Problem: Nutrition: Goal: Adequate nutrition will be maintained Outcome: Progressing   Problem: Coping: Goal: Level of anxiety will decrease Outcome: Progressing   Problem: Elimination: Goal: Will not experience complications related to bowel motility Outcome: Progressing Goal: Will not experience complications related to urinary retention Outcome: Progressing   Problem: Safety: Goal: Ability to remain free from injury will improve Outcome: Progressing   Problem: Skin Integrity: Goal: Risk for impaired skin integrity will decrease Outcome: Progressing   Problem: Education: Goal: Knowledge of disease or condition will improve Outcome: Progressing Goal: Knowledge of secondary prevention will improve (MUST DOCUMENT ALL) Outcome: Progressing Goal: Knowledge of patient specific risk factors will improve Loraine Leriche N/A or DELETE if not current risk factor) Outcome: Progressing   Problem: Ischemic Stroke/TIA Tissue Perfusion: Goal: Complications of ischemic stroke/TIA will be minimized Outcome: Progressing   Problem: Coping: Goal: Will verbalize positive feelings about self Outcome: Progressing Goal: Will identify appropriate support needs Outcome: Progressing   Problem: Health Behavior/Discharge Planning: Goal: Ability to manage health-related needs will improve Outcome: Progressing Goal: Goals will be collaboratively established with patient/family Outcome: Progressing   Problem: Self-Care: Goal: Ability to participate in self-care as condition permits will improve Outcome: Progressing Goal: Verbalization of feelings and concerns over difficulty with self-care will improve Outcome: Progressing Goal: Ability to communicate needs accurately will improve Outcome: Progressing   Problem: Nutrition: Goal: Risk of aspiration will decrease Outcome:  Progressing Goal: Dietary intake will improve Outcome: Progressing   Problem: Education: Goal: Understanding of CV disease, CV risk reduction, and recovery process will improve Outcome: Progressing Goal: Individualized Educational Video(s) Outcome: Progressing   Problem: Activity: Goal: Ability to return to baseline activity level will improve Outcome: Progressing   Problem: Cardiovascular: Goal: Ability to achieve and maintain adequate cardiovascular perfusion will improve Outcome: Progressing Goal: Vascular access site(s) Level 0-1 will be maintained Outcome: Progressing   Problem: Health Behavior/Discharge Planning: Goal: Ability to safely manage health-related needs after  discharge will improve Outcome: Progressing

## 2022-10-08 NOTE — Assessment & Plan Note (Signed)
Hgb 9.7, Hct 30, MCV 91.5 - Monitor with weekly CBC

## 2022-10-08 NOTE — Progress Notes (Signed)
Rounding Note    Patient Name: GAYLORD SEYDEL Date of Encounter: 10/08/2022  [facility] Cardiologist:  Duke Salvia  Subjective   62 yo , Hx of HIV, ( not currently on antiviral therapy) , IVDA, ETOH abuse  Severe MR , MV endocarditis  TCTS has seen him   He is scheduled for cardiac cath on Monday  Will need dental evaluation   He is a poor historian He was not able to tell me where he went to have his HIV managed.  Does not know where he goes to get his meds.    Inpatient Medications    Scheduled Meds:  [START ON 10/09/2022] aspirin  81 mg Oral Pre-Cath   bictegravir-emtricitabine-tenofovir AF  1 tablet Oral Daily   Chlorhexidine Gluconate Cloth  6 each Topical Daily   diclofenac Sodium  4 g Topical QID   escitalopram  20 mg Oral Daily   feeding supplement  237 mL Oral TID BM   lidocaine  1 patch Transdermal Daily   melatonin  3 mg Oral QHS   midodrine  10 mg Oral TID WC   sodium chloride flush  10-40 mL Intracatheter Q12H   thiamine  100 mg Oral Daily   Continuous Infusions:  sodium chloride 250 mL (10/01/22 0905)   [START ON 10/09/2022] sodium chloride     Followed by   Melene Muller ON 10/09/2022] sodium chloride     cefTRIAXone (ROCEPHIN)  IV 2 g (10/08/22 0957)   PRN Meds: acetaminophen, loperamide, sodium chloride flush   Vital Signs    Vitals:   10/07/22 1609 10/07/22 1944 10/08/22 0421 10/08/22 0728  BP: 112/78 104/79 96/70 107/80  Pulse: (!) 102 (!) 101 95 99  Resp: 18   18  Temp: 98.2 F (36.8 C) 98.7 F (37.1 C) 98.1 F (36.7 C) 98 F (36.7 C)  TempSrc:  Oral Oral Oral  SpO2: 92% 92% 92% 91%  Weight:      Height:        Intake/Output Summary (Last 24 hours) at 10/08/2022 1217 Last data filed at 10/07/2022 1231 Gross per 24 hour  Intake 550 ml  Output --  Net 550 ml      10/06/2022    6:58 AM 10/04/2022    6:29 AM 10/03/2022    3:10 AM  Last 3 Weights  Weight (lbs) 121 lb 14.6 oz 118 lb 9.8 oz 120 lb 2.4 oz  Weight (kg) 55.3 kg 53.801  kg 54.5 kg      Telemetry    Nsr  - Personally Reviewed  ECG     - Personally Reviewed  Physical Exam   GEN: very thin, chronically ill appearing man.  NAD very thin   Neck: No JVD Cardiac: RRR, 3/6 holostolic murmur radiating to the axilla  Respiratory: Clear to auscultation bilaterally. GI: Soft, nontender, belly is thin  MS: No edema; No deformity. Extremities are very thin   Neuro:  Nonfocal  Psych: Normal affect   Labs    High Sensitivity Troponin:  No results for input(s): "TROPONINIHS" in the last 720 hours.   Chemistry Recent Labs  Lab 10/05/22 0313  NA 137  K 3.9  CL 102  CO2 24  GLUCOSE 109*  BUN 13  CREATININE 0.71  CALCIUM 8.7*  GFRNONAA >60  ANIONGAP 11    Lipids No results for input(s): "CHOL", "TRIG", "HDL", "LABVLDL", "LDLCALC", "CHOLHDL" in the last 168 hours.  Hematology Recent Labs  Lab 10/05/22 0313  WBC 9.3  RBC 3.28*  HGB 9.7*  HCT 30.0*  MCV 91.5  MCH 29.6  MCHC 32.3  RDW 16.9*  PLT 289   Thyroid No results for input(s): "TSH", "FREET4" in the last 168 hours.  BNPNo results for input(s): "BNP", "PROBNP" in the last 168 hours.  DDimer No results for input(s): "DDIMER" in the last 168 hours.   Radiology    ECHOCARDIOGRAM COMPLETE  Result Date: 10/07/2022    ECHOCARDIOGRAM REPORT   Patient Name:   RASHAWD LASKARIS Date of Exam: 10/07/2022 Medical Rec #:  [id number]          Height:       64.0 in Accession #:    [id number]         Weight:       121.9 lb Date of Birth:  1960/11/24          BSA:          1.585 m Patient Age:    61 years           BP:           88/72 mmHg Patient Gender: M                  HR:           98 bpm. Exam Location:  Inpatient Procedure: 2D Echo, Cardiac Doppler and Color Doppler Indications:    Endocarditis  History:        Patient has prior history of Echocardiogram examinations, most                 recent 09/06/2022.  Sonographer:    Harriette Bouillon RDCS Referring Phys: [id number] TIFFANY Maringouin IMPRESSIONS  1.  Left ventricular ejection fraction, by estimation, is 60 to 65%. The left ventricle has normal function. The left ventricle has no regional wall motion abnormalities. Left ventricular diastolic parameters are consistent with Grade III diastolic dysfunction (restrictive).  2. Right ventricular systolic function is normal. The right ventricular size is normal. There is severely elevated pulmonary artery systolic pressure. The estimated right ventricular systolic pressure is 65.8 mmHg.  3. Left atrial size was severely dilated.  4. The mitral valve is abnormal. The anterior leaflet has partial flail versus perforation. I do not see a definite vegetation but cannot rule out. Severe mitral valve regurgitation. No evidence of mitral stenosis.  5. Tricuspid valve regurgitation is moderate.  6. The aortic valve is tricuspid. Aortic valve regurgitation is not visualized. No aortic stenosis is present.  7. The inferior vena cava is normal in size with <50% respiratory variability, suggesting right atrial pressure of 8 mmHg.  8. Suggest TEE evaluation of mitral regurgitation/?vegetation. FINDINGS  Left Ventricle: Left ventricular ejection fraction, by estimation, is 60 to 65%. The left ventricle has normal function. The left ventricle has no regional wall motion abnormalities. The left ventricular internal cavity size was normal in size. There is  no left ventricular hypertrophy. Left ventricular diastolic parameters are consistent with Grade III diastolic dysfunction (restrictive). Right Ventricle: The right ventricular size is normal. No increase in right ventricular wall thickness. Right ventricular systolic function is normal. There is severely elevated pulmonary artery systolic pressure. The tricuspid regurgitant velocity is 3.80 m/s, and with an assumed right atrial pressure of 8 mmHg, the estimated right ventricular systolic pressure is 65.8 mmHg. Left Atrium: Left atrial size was severely dilated. Right Atrium: Right  atrial size was normal in size. Pericardium: Trivial pericardial effusion is present. Mitral Valve: The  mitral valve is abnormal. Severe mitral valve regurgitation. No evidence of mitral valve stenosis. Tricuspid Valve: The tricuspid valve is normal in structure. Tricuspid valve regurgitation is moderate. Aortic Valve: The aortic valve is tricuspid. Aortic valve regurgitation is not visualized. No aortic stenosis is present. Pulmonic Valve: The pulmonic valve was normal in structure. Pulmonic valve regurgitation is trivial. Aorta: The aortic root is normal in size and structure. Venous: The inferior vena cava is normal in size with less than 50% respiratory variability, suggesting right atrial pressure of 8 mmHg. IAS/Shunts: No atrial level shunt detected by color flow Doppler.  LEFT VENTRICLE PLAX 2D LVIDd:         5.30 cm   Diastology LVIDs:         3.60 cm   LV e' medial:    8.55 cm/s LV PW:         0.80 cm   LV E/e' medial:  16.3 LV IVS:        0.80 cm   LV e' lateral:   9.95 cm/s LVOT diam:     2.00 cm   LV E/e' lateral: 14.0 LV SV:         46 LV SV Index:   29 LVOT Area:     3.14 cm  RIGHT VENTRICLE             IVC RV S prime:     12.60 cm/s  IVC diam: 2.00 cm TAPSE (M-mode): 2.3 cm LEFT ATRIUM             Index        RIGHT ATRIUM           Index LA diam:        4.10 cm 2.59 cm/m   RA Area:     17.20 cm LA Vol (A2C):   71.3 ml 44.98 ml/m  RA Volume:   46.30 ml  29.21 ml/m LA Vol (A4C):   84.5 ml 53.31 ml/m LA Biplane Vol: 83.5 ml 52.67 ml/m  AORTIC VALVE LVOT Vmax:   104.00 cm/s LVOT Vmean:  65.400 cm/s LVOT VTI:    0.148 m  AORTA Ao Root diam: 3.40 cm Ao Asc diam:  3.40 cm MITRAL VALVE                TRICUSPID VALVE MV Area (PHT): 5.62 cm     TR Peak grad:   57.8 mmHg MV Decel Time: 135 msec     TR Vmax:        380.00 cm/s MV E velocity: 139.00 cm/s MV A velocity: 54.50 cm/s   SHUNTS MV E/A ratio:  2.55         Systemic VTI:  0.15 m                             Systemic Diam: 2.00 cm Dalton McleanMD  Electronically signed by Wilfred Lacy Signature Date/Time: 10/07/2022/10:37:04 AM    Final     Cardiac Studies      Patient Profile     62 y.o. 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  I stressed to him the absolute importance to take he meds, keep his medical appointments, avoid IV drugs. I'm not sure if he entirely understands  He is also very malnourished   2.   HIV:  on HIV therapy currently              For questions or updates, please contact [facility] Please consult [email or web address] for contact info under        Signed, Kristeen Miss, MD  10/08/2022, 12:17 PM

## 2022-10-09 NOTE — Assessment & Plan Note (Signed)
MAPs >65 past 24 hours. Stable - Continue midodrine 10mg  TID

## 2022-10-09 NOTE — Assessment & Plan Note (Signed)
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 Pneumo.  TTE 8/2 showed severe MVR, no vegetations seen on echocardiogram but could not be ruled out.  Plan per principal problem. - Continue Rocephin for total of 6 weeks (7/1-8/12) - CT surgery and cardiology following, awaiting catheterization on Monday 8/5

## 2022-10-09 NOTE — Progress Notes (Signed)
Rounding Note    Patient Name: Christopher Farrell Date of Encounter: 10/09/2022  [facility] Cardiologist:  Duke Salvia  Subjective   62 yo , Hx of HIV, ( not currently on antiviral therapy) , IVDA, ETOH abuse  Severe MR , MV endocarditis  TCTS has seen him   He is scheduled for cardiac cath on Monday  Will need dental evaluation   He is a poor historian  We discussed his cath tomorrow  We discussed the risks, benefts, options. He is a very simple man and stated that he understands what I am saying but does not really know exactly what the procedure involves.   Just wants to do what is right   He agrees to the procedure    Inpatient Medications    Scheduled Meds:  bictegravir-emtricitabine-tenofovir AF  1 tablet Oral Daily   Chlorhexidine Gluconate Cloth  6 each Topical Daily   diclofenac Sodium  4 g Topical QID   escitalopram  20 mg Oral Daily   feeding supplement  237 mL Oral TID BM   lidocaine  1 patch Transdermal Daily   melatonin  3 mg Oral QHS   midodrine  10 mg Oral TID WC   sodium chloride flush  10-40 mL Intracatheter Q12H   thiamine  100 mg Oral Daily   Continuous Infusions:  sodium chloride Stopped (10/06/22 1032)   sodium chloride 1 mL/kg/hr (10/09/22 1348)   cefTRIAXone (ROCEPHIN)  IV 2 g (10/09/22 1016)   PRN Meds: acetaminophen, loperamide, sodium chloride flush   Vital Signs    Vitals:   10/08/22 1521 10/08/22 2200 10/09/22 0557 10/09/22 0733  BP: 95/67 112/78 107/87 (!) 113/90  Pulse: 98 100 100 99  Resp: 18 18 18 18   Temp: 98.5 F (36.9 C) 98.1 F (36.7 C) 97.7 F (36.5 C) (!) 97.5 F (36.4 C)  TempSrc: Oral Oral Oral Oral  SpO2: 95% 93% 94% 93%  Weight:   55.3 kg   Height:        Intake/Output Summary (Last 24 hours) at 10/09/2022 1631 Last data filed at 10/09/2022 1348 Gross per 24 hour  Intake 856.66 ml  Output --  Net 856.66 ml      10/09/2022    5:57 AM 10/06/2022    6:58 AM 10/04/2022    6:29 AM  Last 3 Weights   Weight (lbs) 121 lb 14.6 oz 121 lb 14.6 oz 118 lb 9.8 oz  Weight (kg) 55.3 kg 55.3 kg 53.801 kg      Telemetry    Nsr  - Personally Reviewed  ECG     - Personally Reviewed  Physical Exam   Physical Exam: Blood pressure (!) 113/90, pulse 99, temperature (!) 97.5 F (36.4 C), temperature source Oral, resp. rate 18, height 5\' 4"  (1.626 m), weight 55.3 kg, SpO2 93%.       GEN:  middle age, chroncally ill appearing man  in no acute distress HEENT: Normal NECK: No JVD; No carotid bruits LYMPHATICS: No lymphadenopathy CARDIAC: RRR , 3/6 systolic murmur  RESPIRATORY:  Clear to auscultation without rales, wheezing or rhonchi  ABDOMEN: Soft, non-tender, non-distended MUSCULOSKELETAL:  No edema; No deformity  SKIN: Warm and dry NEUROLOGIC:  Alert and oriented x 3   Labs    High Sensitivity Troponin:  No results for input(s): "TROPONINIHS" in the last 720 hours.   Chemistry Recent Labs  Lab 10/05/22 0313  NA 137  K 3.9  CL 102  CO2 24  GLUCOSE  109*  BUN 13  CREATININE 0.71  CALCIUM 8.7*  GFRNONAA >60  ANIONGAP 11    Lipids No results for input(s): "CHOL", "TRIG", "HDL", "LABVLDL", "LDLCALC", "CHOLHDL" in the last 168 hours.  Hematology Recent Labs  Lab 10/05/22 0313  WBC 9.3  RBC 3.28*  HGB 9.7*  HCT 30.0*  MCV 91.5  MCH 29.6  MCHC 32.3  RDW 16.9*  PLT 289   Thyroid No results for input(s): "TSH", "FREET4" in the last 168 hours.  BNPNo results for input(s): "BNP", "PROBNP" in the last 168 hours.  DDimer No results for input(s): "DDIMER" in the last 168 hours.   Radiology    DG Orthopantogram  Result Date: 10/08/2022 CLINICAL DATA:  Preop dental evaluation. Scheduled for cardiac catheterization on Monday. EXAM: ORTHOPANTOGRAM/PANORAMIC COMPARISON:  None Available. FINDINGS: The mandible appears completely edentulous. Multiple upper teeth are missing. Dental amalgam is noted involving the right upper first and second molar. There is a large carie involving  the right upper first Muller. Dental caries are noted involving the left upper cuspid, left upper first bicuspid, and left upper first molar. There is a periapical lucency involving the left upper cuspid. IMPRESSION: Multiple dental caries. Periapical lucency involving the left upper cuspid may represent underlying abscess. Electronically Signed   By: Signa Kell M.D.   On: 10/08/2022 17:45    Cardiac Studies      Patient Profile     62 y.o. 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   He seems to have some cognitive impairment .  I stressed to him the absolute importance to take he meds, keep his medical appointments, avoid IV drugs. I'm not sure if he entirely understands       2.   HIV:  on HIV therapy currently         For questions or updates, please contact [facility] Please consult [email or web address] for contact info under        Signed, Kristeen Miss, MD  10/09/2022, 4:31 PM

## 2022-10-09 NOTE — Assessment & Plan Note (Signed)
Severe Malnutrition related to chronic illness (HIV) as evidenced by severe fat depletion, severe muscle depletion.  - Weekly CBC, BMP, add Phos for Wednesday - RD following, last seen 7/25 - Regular diet - Continue Ensure 3 times daily - Mighty shakes 3 times daily with meals - Mighty cup 3 times daily with meals

## 2022-10-09 NOTE — H&P (View-Only) (Signed)
Rounding Note    Patient Name: Christopher Farrell Date of Encounter: 10/09/2022  [facility] Cardiologist:  Duke Salvia  Subjective   62 yo , Hx of HIV, ( not currently on antiviral therapy) , IVDA, ETOH abuse  Severe MR , MV endocarditis  TCTS has seen him   He is scheduled for cardiac cath on Monday  Will need dental evaluation   He is a poor historian  We discussed his cath tomorrow  We discussed the risks, benefts, options. He is a very simple man and stated that he understands what I am saying but does not really know exactly what the procedure involves.   Just wants to do what is right   He agrees to the procedure    Inpatient Medications    Scheduled Meds:  bictegravir-emtricitabine-tenofovir AF  1 tablet Oral Daily   Chlorhexidine Gluconate Cloth  6 each Topical Daily   diclofenac Sodium  4 g Topical QID   escitalopram  20 mg Oral Daily   feeding supplement  237 mL Oral TID BM   lidocaine  1 patch Transdermal Daily   melatonin  3 mg Oral QHS   midodrine  10 mg Oral TID WC   sodium chloride flush  10-40 mL Intracatheter Q12H   thiamine  100 mg Oral Daily   Continuous Infusions:  sodium chloride Stopped (10/06/22 1032)   sodium chloride 1 mL/kg/hr (10/09/22 1348)   cefTRIAXone (ROCEPHIN)  IV 2 g (10/09/22 1016)   PRN Meds: acetaminophen, loperamide, sodium chloride flush   Vital Signs    Vitals:   10/08/22 1521 10/08/22 2200 10/09/22 0557 10/09/22 0733  BP: 95/67 112/78 107/87 (!) 113/90  Pulse: 98 100 100 99  Resp: 18 18 18 18   Temp: 98.5 F (36.9 C) 98.1 F (36.7 C) 97.7 F (36.5 C) (!) 97.5 F (36.4 C)  TempSrc: Oral Oral Oral Oral  SpO2: 95% 93% 94% 93%  Weight:   55.3 kg   Height:        Intake/Output Summary (Last 24 hours) at 10/09/2022 1631 Last data filed at 10/09/2022 1348 Gross per 24 hour  Intake 856.66 ml  Output --  Net 856.66 ml      10/09/2022    5:57 AM 10/06/2022    6:58 AM 10/04/2022    6:29 AM  Last 3 Weights   Weight (lbs) 121 lb 14.6 oz 121 lb 14.6 oz 118 lb 9.8 oz  Weight (kg) 55.3 kg 55.3 kg 53.801 kg      Telemetry    Nsr  - Personally Reviewed  ECG     - Personally Reviewed  Physical Exam   Physical Exam: Blood pressure (!) 113/90, pulse 99, temperature (!) 97.5 F (36.4 C), temperature source Oral, resp. rate 18, height 5\' 4"  (1.626 m), weight 55.3 kg, SpO2 93%.       GEN:  middle age, chroncally ill appearing man  in no acute distress HEENT: Normal NECK: No JVD; No carotid bruits LYMPHATICS: No lymphadenopathy CARDIAC: RRR , 3/6 systolic murmur  RESPIRATORY:  Clear to auscultation without rales, wheezing or rhonchi  ABDOMEN: Soft, non-tender, non-distended MUSCULOSKELETAL:  No edema; No deformity  SKIN: Warm and dry NEUROLOGIC:  Alert and oriented x 3   Labs    High Sensitivity Troponin:  No results for input(s): "TROPONINIHS" in the last 720 hours.   Chemistry Recent Labs  Lab 10/05/22 0313  NA 137  K 3.9  CL 102  CO2 24  GLUCOSE  109*  BUN 13  CREATININE 0.71  CALCIUM 8.7*  GFRNONAA >60  ANIONGAP 11    Lipids No results for input(s): "CHOL", "TRIG", "HDL", "LABVLDL", "LDLCALC", "CHOLHDL" in the last 168 hours.  Hematology Recent Labs  Lab 10/05/22 0313  WBC 9.3  RBC 3.28*  HGB 9.7*  HCT 30.0*  MCV 91.5  MCH 29.6  MCHC 32.3  RDW 16.9*  PLT 289   Thyroid No results for input(s): "TSH", "FREET4" in the last 168 hours.  BNPNo results for input(s): "BNP", "PROBNP" in the last 168 hours.  DDimer No results for input(s): "DDIMER" in the last 168 hours.   Radiology    DG Orthopantogram  Result Date: 10/08/2022 CLINICAL DATA:  Preop dental evaluation. Scheduled for cardiac catheterization on Monday. EXAM: ORTHOPANTOGRAM/PANORAMIC COMPARISON:  None Available. FINDINGS: The mandible appears completely edentulous. Multiple upper teeth are missing. Dental amalgam is noted involving the right upper first and second molar. There is a large carie involving  the right upper first Muller. Dental caries are noted involving the left upper cuspid, left upper first bicuspid, and left upper first molar. There is a periapical lucency involving the left upper cuspid. IMPRESSION: Multiple dental caries. Periapical lucency involving the left upper cuspid may represent underlying abscess. Electronically Signed   By: Signa Kell M.D.   On: 10/08/2022 17:45    Cardiac Studies      Patient Profile     62 y.o. 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   He seems to have some cognitive impairment .  I stressed to him the absolute importance to take he meds, keep his medical appointments, avoid IV drugs. I'm not sure if he entirely understands       2.   HIV:  on HIV therapy currently         For questions or updates, please contact [facility] Please consult [email or web address] for contact info under        Signed, Kristeen Miss, MD  10/09/2022, 4:31 PM

## 2022-10-09 NOTE — Assessment & Plan Note (Signed)
HIV load 8/1 showed quant of 40 copies of 1.602 (On 7/1 was 102K and 5.009 respectively). - Biktarvy 50-200-25 daily - ID follow-up outpatient

## 2022-10-09 NOTE — Progress Notes (Addendum)
Daily Progress Note Intern Pager: [phone]  Patient name: Christopher Farrell Medical record number: [id number] Date of birth: Mar 30, 1960 Age: 62 y.o. Gender: male  Primary Care Provider: Pcp, No Consultants: Cardiology, ID Code Status: FULL  Pt Overview and Major Events to Date:  7/1: Admission and Intubation  7/2: Brain MRI - multifocal acute ischemia in R>L cerebral hemispheres and cerebellum  7/4: Extubated 7/5: Transferred to [facility]MTS  7/8: TEE - MV multilobular vegetation  7/9: Prolonged IV Rocephin for 6 weeks total 7/10: Started Lexapro 10 mg 7/16: Brain MRI - similar artifact present need CT Head to r/o hemorrhage  7/17: CT head negative 7/19: repeated low BPs, started midodrine 5mg  7/21: Increased midodrine to 10 mg 7/24: Increase Lexapro to 20 mg 8/4: N.p.o. at midnight for cardiac catheterization on 8/5  Assessment and Plan: Christopher Farrell is a 62 y.o. male who was admitted with streptococcus bacteremia with associated meningitis and MV endocarditis currently on 6 weeks of IV antibiotics inpatient due to IVDU hx.   [facility]     * (Principal) Streptococcal bacteremia     Mitral valve vegetation from S. Pneumo.  Patient continued to remain  afebrile.  Not a good candidate for outpatient IV therapy due to hx of  IVDU.  TTE 8/1 showed severe PR, flail versus perforation of anterior  leaflet.  No specific vegetations seen but this cannot be ruled out.   Cardiology to perform catheterization on Monday 8/5 to rule out CAD.  CT  surgery will reassess on Monday 8/5 about valve replacement plans. Will  remain inpatient to complete IV antibiotics. - Continue 6 weeks of Rocephin (7/1-8/12) - Continue routine vitals - Make n.p.o. at midnight 8/5 for cardiac catheterization - Cardiology and CT surgery following, appreciate recommendations  Meningitis due to S. Pneumo.  No reported headaches and continued to  remain afebrile. - STAT head CT for any change in neuro  exam  - BMP weekly (next on 8/7)        Human immunodeficiency virus (HIV) disease (HCC)     HIV load 8/1 showed quant of 40 copies of 1.602 (On 7/1 was 102K and  5.009 respectively). - Biktarvy 50-200-25 daily - ID follow-up outpatient         Anxiety and depression     Stable on Lexapro 20 mg, started inpatient. Mood is good. - Consider adding Wellbutrin if resistant after 4-6 weeks        Protein-calorie malnutrition, severe (HCC)     Severe Malnutrition related to chronic illness (HIV) as evidenced by  severe fat depletion, severe muscle depletion.  - Weekly CBC, BMP, add Phos for Wednesday - RD following, last seen 7/25 - Regular diet - Continue Ensure 3 times daily - Mighty shakes 3 times daily with meals - Mighty cup 3 times daily with meals        Acute bacterial endocarditis     1.5 atrial mass with large vegetation of anterior mitral leaflet due to  S. Pneumo.  TTE 8/2 showed severe MVR, no vegetations seen on  echocardiogram but could not be ruled out.  Plan per principal problem. - Continue Rocephin for total of 6 weeks (7/1-8/12) - CT surgery and cardiology following, awaiting catheterization on Monday  8/5        Hypotension     MAPs >65 past 24 hours. Stable - Continue midodrine 10mg  TID        Anemia, chronic disease  No AM labs today. - Monitor with weekly CBC       FEN/GI: Regular diet PPx: No pharmacologic ppx, can ambulate- encourage OOB to chair q4h while awake Dispo:TBD. Completes IV abx 8/12, possible need for cardiothoracic surgery pending cardiac cath 8/5  Subjective:  Feeling well, in good spirits. No concerns or complaints.  Objective: Temp:  [97.7 F (36.5 C)-98.5 F (36.9 C)] 97.7 F (36.5 C) (08/04 0557) Pulse Rate:  [98-100] 100 (08/04 0557) Resp:  [18] 18 (08/04 0557) BP: (95-112)/(67-87) 107/87 (08/04 0557) SpO2:  [91 %-95 %] 94 % (08/04 0557) Weight:  [55.3 kg] 55.3 kg (08/04 0557) Physical Exam: General:  Chronically ill-appearing, awake and alert Cardiovascular: Regular rate and rhythm with 3 out of 6 systolic murmur Respiratory: Normal work of breathing on room air.  Lungs are clear in all fields to auscultation Abdomen: Soft, thin, nontender nondistended Extremities: Warm and well-perfused with no peripheral edema  Laboratory: Most recent CBC Lab Results  Component Value Date   WBC 9.3 10/05/2022   HGB 9.7 (L) 10/05/2022   HCT 30.0 (L) 10/05/2022   MCV 91.5 10/05/2022   PLT 289 10/05/2022   Most recent BMP    Latest Ref Rng & Units 10/05/2022    3:13 AM  BMP  Glucose 70 - 99 mg/dL 409   BUN 8 - 23 mg/dL 13   Creatinine 8.11 - 1.24 mg/dL 9.14   Sodium 782 - 956 mmol/L 137   Potassium 3.5 - 5.1 mmol/L 3.9   Chloride 98 - 111 mmol/L 102   CO2 22 - 32 mmol/L 24   Calcium 8.9 - 10.3 mg/dL 8.7     Christopher Dash, DO 10/09/2022, 7:16 AM  PGY-3, [facility] FPTS Intern pager: [phone], text pages welcome Secure chat group [facility] Teaching Service

## 2022-10-09 NOTE — Plan of Care (Signed)
Problem: Education: Goal: Ability to describe self-care measures that may prevent or decrease complications (Diabetes Survival Skills Education) will improve Outcome: Progressing Goal: Individualized Educational Video(s) Outcome: Progressing   Problem: Coping: Goal: Ability to adjust to condition or change in health will improve Outcome: Progressing   Problem: Fluid Volume: Goal: Ability to maintain a balanced intake and output will improve Outcome: Progressing   Problem: Health Behavior/Discharge Planning: Goal: Ability to identify and utilize available resources and services will improve Outcome: Progressing Goal: Ability to manage health-related needs will improve Outcome: Progressing   Problem: Metabolic: Goal: Ability to maintain appropriate glucose levels will improve Outcome: Progressing   Problem: Nutritional: Goal: Maintenance of adequate nutrition will improve Outcome: Progressing Goal: Progress toward achieving an optimal weight will improve Outcome: Progressing   Problem: Skin Integrity: Goal: Risk for impaired skin integrity will decrease Outcome: Progressing   Problem: Tissue Perfusion: Goal: Adequacy of tissue perfusion will improve Outcome: Progressing   Problem: Education: Goal: Knowledge of General Education information will improve Description: Including pain rating scale, medication(s)/side effects and non-pharmacologic comfort measures Outcome: Progressing   Problem: Health Behavior/Discharge Planning: Goal: Ability to manage health-related needs will improve Outcome: Progressing   Problem: Clinical Measurements: Goal: Ability to maintain clinical measurements within normal limits will improve Outcome: Progressing Goal: Will remain free from infection Outcome: Progressing Goal: Diagnostic test results will improve Outcome: Progressing Goal: Respiratory complications will improve Outcome: Progressing Goal: Cardiovascular complication will  be avoided Outcome: Progressing   Problem: Activity: Goal: Risk for activity intolerance will decrease Outcome: Progressing   Problem: Nutrition: Goal: Adequate nutrition will be maintained Outcome: Progressing   Problem: Coping: Goal: Level of anxiety will decrease Outcome: Progressing   Problem: Elimination: Goal: Will not experience complications related to bowel motility Outcome: Progressing Goal: Will not experience complications related to urinary retention Outcome: Progressing   Problem: Safety: Goal: Ability to remain free from injury will improve Outcome: Progressing   Problem: Skin Integrity: Goal: Risk for impaired skin integrity will decrease Outcome: Progressing   Problem: Education: Goal: Knowledge of disease or condition will improve Outcome: Progressing Goal: Knowledge of secondary prevention will improve (MUST DOCUMENT ALL) Outcome: Progressing Goal: Knowledge of patient specific risk factors will improve Loraine Leriche N/A or DELETE if not current risk factor) Outcome: Progressing   Problem: Ischemic Stroke/TIA Tissue Perfusion: Goal: Complications of ischemic stroke/TIA will be minimized Outcome: Progressing   Problem: Coping: Goal: Will verbalize positive feelings about self Outcome: Progressing Goal: Will identify appropriate support needs Outcome: Progressing   Problem: Health Behavior/Discharge Planning: Goal: Ability to manage health-related needs will improve Outcome: Progressing Goal: Goals will be collaboratively established with patient/family Outcome: Progressing   Problem: Self-Care: Goal: Ability to participate in self-care as condition permits will improve Outcome: Progressing Goal: Verbalization of feelings and concerns over difficulty with self-care will improve Outcome: Progressing Goal: Ability to communicate needs accurately will improve Outcome: Progressing   Problem: Nutrition: Goal: Risk of aspiration will decrease Outcome:  Progressing Goal: Dietary intake will improve Outcome: Progressing   Problem: Education: Goal: Understanding of CV disease, CV risk reduction, and recovery process will improve Outcome: Progressing Goal: Individualized Educational Video(s) Outcome: Progressing   Problem: Activity: Goal: Ability to return to baseline activity level will improve Outcome: Progressing   Problem: Cardiovascular: Goal: Ability to achieve and maintain adequate cardiovascular perfusion will improve Outcome: Progressing Goal: Vascular access site(s) Level 0-1 will be maintained Outcome: Progressing   Problem: Health Behavior/Discharge Planning: Goal: Ability to safely manage health-related needs after  discharge will improve Outcome: Progressing

## 2022-10-09 NOTE — Assessment & Plan Note (Signed)
Stable on Lexapro 20 mg, started inpatient. Mood is good. - Consider adding Wellbutrin if resistant after 4-6 weeks

## 2022-10-09 NOTE — Assessment & Plan Note (Signed)
No AM labs today. - Monitor with weekly CBC

## 2022-10-10 ENCOUNTER — Encounter (HOSPITAL_COMMUNITY)
Admission: EM | Disposition: A | Discharge status: Home Health | Payer: Self-pay | Source: Home / Self Care | Attending: Family Medicine

## 2022-10-10 ENCOUNTER — Other Ambulatory Visit (HOSPITAL_COMMUNITY): Payer: Self-pay

## 2022-10-10 DIAGNOSIS — Z0120 Encounter for dental examination and cleaning without abnormal findings: Secondary | ICD-10-CM

## 2022-10-10 DIAGNOSIS — I272 Pulmonary hypertension, unspecified: Secondary | ICD-10-CM | POA: Insufficient documentation

## 2022-10-10 DIAGNOSIS — I340 Nonrheumatic mitral (valve) insufficiency: Secondary | ICD-10-CM | POA: Insufficient documentation

## 2022-10-10 DIAGNOSIS — Z8679 Personal history of other diseases of the circulatory system: Secondary | ICD-10-CM

## 2022-10-10 HISTORY — PX: RIGHT/LEFT HEART CATH AND CORONARY ANGIOGRAPHY: CATH118266

## 2022-10-10 LAB — CBC
HCT: 29 % — ABNORMAL LOW (ref 39.0–52.0)
Hemoglobin: 9.2 g/dL — ABNORMAL LOW (ref 13.0–17.0)
MCH: 29 pg (ref 26.0–34.0)
MCHC: 31.7 g/dL (ref 30.0–36.0)
MCV: 91.5 fL (ref 80.0–100.0)
Platelets: 310 10*3/uL (ref 150–400)
RBC: 3.17 MIL/uL — ABNORMAL LOW (ref 4.22–5.81)
RDW: 17.5 % — ABNORMAL HIGH (ref 11.5–15.5)
WBC: 7.8 10*3/uL (ref 4.0–10.5)
nRBC: 0 % (ref 0.0–0.2)

## 2022-10-10 LAB — POCT I-STAT 7, (LYTES, BLD GAS, ICA,H+H)
Acid-base deficit: 4 mmol/L — ABNORMAL HIGH (ref 0.0–2.0)
Bicarbonate: 19 mmol/L — ABNORMAL LOW (ref 20.0–28.0)
Calcium, Ion: 1.17 mmol/L (ref 1.15–1.40)
HCT: 26 % — ABNORMAL LOW (ref 39.0–52.0)
Hemoglobin: 8.8 g/dL — ABNORMAL LOW (ref 13.0–17.0)
O2 Saturation: 92 %
Potassium: 3.7 mmol/L (ref 3.5–5.1)
Sodium: 141 mmol/L (ref 135–145)
TCO2: 20 mmol/L — ABNORMAL LOW (ref 22–32)
pCO2 arterial: 27.1 mmHg — ABNORMAL LOW (ref 32–48)
pH, Arterial: 7.456 — ABNORMAL HIGH (ref 7.35–7.45)
pO2, Arterial: 59 mmHg — ABNORMAL LOW (ref 83–108)

## 2022-10-10 LAB — POCT I-STAT EG7
Acid-base deficit: 4 mmol/L — ABNORMAL HIGH (ref 0.0–2.0)
Acid-base deficit: 6 mmol/L — ABNORMAL HIGH (ref 0.0–2.0)
Bicarbonate: 18.9 mmol/L — ABNORMAL LOW (ref 20.0–28.0)
Bicarbonate: 20.4 mmol/L (ref 20.0–28.0)
Calcium, Ion: 1.01 mmol/L — ABNORMAL LOW (ref 1.15–1.40)
Calcium, Ion: 1.11 mmol/L — ABNORMAL LOW (ref 1.15–1.40)
HCT: 26 % — ABNORMAL LOW (ref 39.0–52.0)
HCT: 26 % — ABNORMAL LOW (ref 39.0–52.0)
Hemoglobin: 8.8 g/dL — ABNORMAL LOW (ref 13.0–17.0)
Hemoglobin: 8.8 g/dL — ABNORMAL LOW (ref 13.0–17.0)
O2 Saturation: 57 %
O2 Saturation: 65 %
Potassium: 3.3 mmol/L — ABNORMAL LOW (ref 3.5–5.1)
Potassium: 3.7 mmol/L (ref 3.5–5.1)
Sodium: 139 mmol/L (ref 135–145)
Sodium: 142 mmol/L (ref 135–145)
TCO2: 20 mmol/L — ABNORMAL LOW (ref 22–32)
TCO2: 21 mmol/L — ABNORMAL LOW (ref 22–32)
pCO2, Ven: 32.7 mmHg — ABNORMAL LOW (ref 44–60)
pCO2, Ven: 33.6 mmHg — ABNORMAL LOW (ref 44–60)
pH, Ven: 7.371 (ref 7.25–7.43)
pH, Ven: 7.392 (ref 7.25–7.43)
pO2, Ven: 30 mmHg — CL (ref 32–45)
pO2, Ven: 33 mmHg (ref 32–45)

## 2022-10-10 SURGERY — RIGHT/LEFT HEART CATH AND CORONARY ANGIOGRAPHY
Anesthesia: LOCAL

## 2022-10-10 MED ORDER — LABETALOL HCL 5 MG/ML IV SOLN: 10.0000 mg | INTRAVENOUS | Status: AC | PRN

## 2022-10-10 MED ORDER — ALPRAZOLAM 0.5 MG PO TABS: 0.2500 mg | ORAL_TABLET | Freq: Two times a day (BID) | ORAL | Status: DC | PRN

## 2022-10-10 MED ORDER — LIDOCAINE HCL (PF) 1 % IJ SOLN
INTRAMUSCULAR | Status: AC
  Filled 2022-10-10: qty 30

## 2022-10-10 MED ORDER — HEPARIN SODIUM (PORCINE) 1000 UNIT/ML IJ SOLN
INTRAMUSCULAR | Status: DC | PRN
  Administered 2022-10-10: 5000 [IU] via INTRA_ARTERIAL

## 2022-10-10 MED ORDER — VERAPAMIL HCL 2.5 MG/ML IV SOLN
INTRAVENOUS | Status: DC | PRN
  Administered 2022-10-10: 10 mL via INTRA_ARTERIAL

## 2022-10-10 MED ORDER — LIDOCAINE HCL (PF) 1 % IJ SOLN
INTRAMUSCULAR | Status: DC | PRN
  Administered 2022-10-10: 5 mL
  Administered 2022-10-10: 2 mL

## 2022-10-10 MED ORDER — ONDANSETRON HCL 4 MG/2ML IJ SOLN: 4.0000 mg | Freq: Four times a day (QID) | INTRAMUSCULAR | Status: DC | PRN

## 2022-10-10 MED ORDER — IOHEXOL 350 MG/ML SOLN
INTRAVENOUS | Status: DC | PRN
  Administered 2022-10-10: 35 mL via INTRA_ARTERIAL

## 2022-10-10 MED ORDER — VERAPAMIL HCL 2.5 MG/ML IV SOLN
INTRAVENOUS | Status: AC
  Filled 2022-10-10: qty 2

## 2022-10-10 MED ORDER — ASPIRIN 81 MG PO CHEW: 81.0000 mg | CHEWABLE_TABLET | ORAL | Status: DC

## 2022-10-10 MED ORDER — ZOLPIDEM TARTRATE 5 MG PO TABS: 5.0000 mg | ORAL_TABLET | Freq: Every evening | ORAL | Status: DC | PRN

## 2022-10-10 MED ORDER — SODIUM CHLORIDE 0.9 % WEIGHT BASED INFUSION
1.0000 mL/kg/h | INTRAVENOUS | Status: AC
  Administered 2022-10-10: 1 mL/kg/h via INTRAVENOUS

## 2022-10-10 MED ORDER — HYDRALAZINE HCL 20 MG/ML IJ SOLN: 10.0000 mg | INTRAMUSCULAR | Status: AC | PRN

## 2022-10-10 MED ORDER — HEPARIN (PORCINE) IN NACL 1000-0.9 UT/500ML-% IV SOLN
INTRAVENOUS | Status: DC | PRN
  Administered 2022-10-10 (×2): 500 mL

## 2022-10-10 MED ORDER — ENOXAPARIN SODIUM 30 MG/0.3ML IJ SOSY
30.0000 mg | PREFILLED_SYRINGE | INTRAMUSCULAR | Status: DC
  Administered 2022-10-11 – 2022-10-17 (×7): 30 mg via SUBCUTANEOUS
  Filled 2022-10-10 (×7): qty 0.3

## 2022-10-10 MED ORDER — MIDAZOLAM HCL 2 MG/2ML IJ SOLN
INTRAMUSCULAR | Status: DC | PRN
  Administered 2022-10-10: 1 mg via INTRAVENOUS

## 2022-10-10 MED ORDER — MIDAZOLAM HCL 2 MG/2ML IJ SOLN
INTRAMUSCULAR | Status: AC
  Filled 2022-10-10: qty 2

## 2022-10-10 MED ORDER — HEPARIN SODIUM (PORCINE) 1000 UNIT/ML IJ SOLN
INTRAMUSCULAR | Status: AC
  Filled 2022-10-10: qty 10

## 2022-10-10 SURGICAL SUPPLY — 12 items
CATH INFINITI 5FR ANG PIGTAIL (CATHETERS) IMPLANT
CATH INFINITI AMBI 6FR TG (CATHETERS) IMPLANT
CATH SWAN GANZ 7F STRAIGHT (CATHETERS) IMPLANT
DEVICE RAD TR BAND REGULAR (VASCULAR PRODUCTS) IMPLANT
ELECT DEFIB PAD ADLT CADENCE (PAD) IMPLANT
GLIDESHEATH SLEND SS 6F .021 (SHEATH) IMPLANT
PACK CARDIAC CATHETERIZATION (CUSTOM PROCEDURE TRAY) ×1 IMPLANT
SET ATX-X65L (MISCELLANEOUS) IMPLANT
SHEATH PINNACLE 7F 10CM (SHEATH) IMPLANT
SHEATH PROBE COVER 6X72 (BAG) IMPLANT
WIRE EMERALD 3MM-J .035X260CM (WIRE) IMPLANT
WIRE MICRO SET SILHO 5FR 7 (SHEATH) IMPLANT

## 2022-10-10 NOTE — Assessment & Plan Note (Signed)
MAPs >65 past 24 hours. Stable. - Continue midodrine 10mg  TID

## 2022-10-10 NOTE — Assessment & Plan Note (Signed)
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 Pneumo.  TTE 8/2 showed severe MVR, no vegetations seen on echocardiogram but could not be ruled out.  Plan per principal problem. - Continue Rocephin for total of 6 weeks (7/1-8/12) - CT surgery and cardiology following, follow up recommendations after cath today, 8/5

## 2022-10-10 NOTE — Assessment & Plan Note (Signed)
Hgb stable 9.2. - Monitor with weekly CBC

## 2022-10-10 NOTE — Assessment & Plan Note (Signed)
Orthopantogram with multiple caries and periapical lucency involving the left upper cuspid that may represent underlying abscess. On abx therapy as above. Remains afebrile. May need dental evaluation before CT surgery proceeds, depending on recs. - Follow CT surgery recs after cath today, 8/5 - Definite dental connection outpatient

## 2022-10-10 NOTE — Plan of Care (Signed)
  Problem: Metabolic: Goal: Ability to maintain appropriate glucose levels will improve Outcome: Progressing   Problem: Nutritional: Goal: Maintenance of adequate nutrition will improve Outcome: Progressing   Problem: Skin Integrity: Goal: Risk for impaired skin integrity will decrease Outcome: Progressing   Problem: Nutrition: Goal: Adequate nutrition will be maintained Outcome: Progressing   Problem: Safety: Goal: Ability to remain free from injury will improve Outcome: Progressing   Problem: Nutrition: Goal: Risk of aspiration will decrease Outcome: Progressing Goal: Dietary intake will improve Outcome: Progressing   Problem: Activity: Goal: Ability to return to baseline activity level will improve Outcome: Progressing

## 2022-10-10 NOTE — Progress Notes (Signed)
[address]       Christopher Farrell [address]             [phone]      Day of Surgery  Procedure(s) (LRB): RIGHT/LEFT HEART CATH AND CORONARY ANGIOGRAPHY (N/A)   Total Length of Stay:  LOS: 35 days    SUBJECTIVE: Pt frustrated about stay.  Vitals:   10/10/22 1415 10/10/22 1528  BP:  101/80  Pulse: 94 96  Resp: (!) 22 18  Temp:  97.8 F (36.6 C)  SpO2: 91% 96%    Intake/Output      08/04 0701 08/05 0700 08/05 0701 08/06 0700   I.V. (mL/kg) 540.1 (9.2)    Total Intake(mL/kg) 540.1 (9.2)    Urine (mL/kg/hr)  1100 (1.9)   Total Output  1100   Net +540.1 -1100            sodium chloride Stopped (10/06/22 1032)   sodium chloride 1 mL/kg/hr (10/10/22 1422)   cefTRIAXone (ROCEPHIN)  IV 2 g (10/10/22 0907)    CBC    Component Value Date/Time   WBC 7.8 10/10/2022 0408   RBC 3.17 (L) 10/10/2022 0408   HGB 8.8 (L) 10/10/2022 1124   HGB 14.0 06/29/2021 1010   HCT 26.0 (L) 10/10/2022 1124   HCT 40.8 06/29/2021 1010   PLT 310 10/10/2022 0408   PLT 232 06/29/2021 1010   MCV 91.5 10/10/2022 0408   MCV 95 06/29/2021 1010   MCH 29.0 10/10/2022 0408   MCHC 31.7 10/10/2022 0408   RDW 17.5 (H) 10/10/2022 0408   RDW 12.9 06/29/2021 1010   LYMPHSABS 3.1 10/01/2022 0408   LYMPHSABS 1.7 06/29/2021 1010   MONOABS 0.7 10/01/2022 0408   EOSABS 0.3 10/01/2022 0408   EOSABS 0.0 06/29/2021 1010   BASOSABS 0.1 10/01/2022 0408   BASOSABS 0.1 06/29/2021 1010   CMP     Component Value Date/Time   NA 139 10/10/2022 1124   K 3.3 (L) 10/10/2022 1124   CL 107 10/10/2022 0408   CO2 21 (L) 10/10/2022 0408   GLUCOSE 109 (H) 10/10/2022 0408   GLUCOSE 107 07/31/2008 0000   BUN 17 10/10/2022 0408   CREATININE 0.75 10/10/2022 0408   CREATININE 0.91 06/29/2021 1010   CALCIUM 8.6 (L) 10/10/2022 0408   PROT 6.9 09/28/2022 0445   ALBUMIN 2.5 (L) 09/28/2022 0445   AST 16 09/28/2022 0445   ALT 16 09/28/2022 0445   ALKPHOS 47 09/28/2022 0445   BILITOT 0.2 (L)  09/28/2022 0445   GFRNONAA >60 10/10/2022 0408   GFRNONAA 97 05/21/2020 1414   GFRAA 112 05/21/2020 1414   ABG    Component Value Date/Time   PHART 7.456 (H) 10/10/2022 1120   PCO2ART 27.1 (L) 10/10/2022 1120   PO2ART 59 (L) 10/10/2022 1120   HCO3 18.9 (L) 10/10/2022 1124   TCO2 20 (L) 10/10/2022 1124   ACIDBASEDEF 6.0 (H) 10/10/2022 1124   O2SAT 57 10/10/2022 1124   CBG (last 3)  No results for input(s): "GLUCAP" in the last 72 hours.   ASSESSMENT: Strept pneumonia Endocarditis Severe MR No CAD by cath but PHTN  Severe protein calorie malnutrition HIV infection with low CD4 count felt by ID to be due to current bacterial infection and not HIV Deconditioned  PLAN: Risk of MV surgery high with above noted Would like repeat TEE to evaluate overall pathology Need to finish 6 weeks of antibiotics Has dental abscess by orthopantogram: need to have teeth removed  Will evaluate timing of surgery after above Could pt be discharged with medical management of MR prior to surgery to rehab prior to high risk surgery?   Eugenio Hoes, MD 10/10/2022

## 2022-10-10 NOTE — Progress Notes (Signed)
Rounding Note    Patient Name: Christopher Farrell Date of Encounter: 10/10/2022  [facility] Cardiologist: Dr. Duke Salvia  Subjective   Seen after cath. Family in room. Reviewed results. No current questions.  Inpatient Medications    Scheduled Meds:  bictegravir-emtricitabine-tenofovir AF  1 tablet Oral Daily   Chlorhexidine Gluconate Cloth  6 each Topical Daily   diclofenac Sodium  4 g Topical QID   [START ON 10/11/2022] enoxaparin (LOVENOX) injection  30 mg Subcutaneous Q24H   escitalopram  20 mg Oral Daily   feeding supplement  237 mL Oral TID BM   lidocaine  1 patch Transdermal Daily   melatonin  3 mg Oral QHS   midodrine  10 mg Oral TID WC   sodium chloride flush  10-40 mL Intracatheter Q12H   thiamine  100 mg Oral Daily   Continuous Infusions:  sodium chloride Stopped (10/06/22 1032)   sodium chloride 1 mL/kg/hr (10/10/22 1422)   cefTRIAXone (ROCEPHIN)  IV 2 g (10/10/22 0907)   PRN Meds: acetaminophen, ALPRAZolam, hydrALAZINE, labetalol, loperamide, ondansetron (ZOFRAN) IV, sodium chloride flush, zolpidem   Vital Signs    Vitals:   10/10/22 1300 10/10/22 1400 10/10/22 1415 10/10/22 1528  BP: 110/78 105/81  101/80  Pulse: 94 93 94 96  Resp: (!) 26 (!) 23 (!) 22 18  Temp:    97.8 F (36.6 C)  TempSrc:    Oral  SpO2: 92% 90% 91% 96%  Weight:      Height:        Intake/Output Summary (Last 24 hours) at 10/10/2022 1612 Last data filed at 10/10/2022 1508 Gross per 24 hour  Intake --  Output 1100 ml  Net -1100 ml      10/10/2022    6:52 AM 10/09/2022    5:57 AM 10/06/2022    6:58 AM  Last 3 Weights  Weight (lbs) 128 lb 12 oz 121 lb 14.6 oz 121 lb 14.6 oz  Weight (kg) 58.4 kg 55.3 kg 55.3 kg      Telemetry    Not on telemetry - Personally Reviewed  ECG    No new - Personally Reviewed  Physical Exam   GEN: No acute distress.   Neck: No JVD Cardiac: RRR, no rubs, or gallops. 4/6 holosystolic murmur Respiratory: Clear to auscultation  bilaterally. GI: Soft, nontender, non-distended  MS: No edema; No deformity. Neuro:  Nonfocal  Psych: Normal affect   Labs    High Sensitivity Troponin:  No results for input(s): "TROPONINIHS" in the last 720 hours.   Chemistry Recent Labs  Lab 10/05/22 0313 10/10/22 0408  NA 137 137  K 3.9 3.7  CL 102 107  CO2 24 21*  GLUCOSE 109* 109*  BUN 13 17  CREATININE 0.71 0.75  CALCIUM 8.7* 8.6*  GFRNONAA >60 >60  ANIONGAP 11 9    Lipids No results for input(s): "CHOL", "TRIG", "HDL", "LABVLDL", "LDLCALC", "CHOLHDL" in the last 168 hours.  Hematology Recent Labs  Lab 10/05/22 0313 10/10/22 0408  WBC 9.3 7.8  RBC 3.28* 3.17*  HGB 9.7* 9.2*  HCT 30.0* 29.0*  MCV 91.5 91.5  MCH 29.6 29.0  MCHC 32.3 31.7  RDW 16.9* 17.5*  PLT 289 310   Thyroid No results for input(s): "TSH", "FREET4" in the last 168 hours.  BNPNo results for input(s): "BNP", "PROBNP" in the last 168 hours.  DDimer No results for input(s): "DDIMER" in the last 168 hours.   Radiology    CARDIAC CATHETERIZATION  Result  Date: 10/10/2022 1.  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 2.  Fick cardiac output of 6.2 L/min and Fick cardiac index of 3.9 L/min/m with the following hemodynamics:  Right atrial pressure mean of 11 mmHg  RV pressure 62/3 with an end-diastolic pressure of 14 mmHg  Wedge pressure mean of 21 mmHg with V waves to 31 mmHg  PA pressure 64/22 with a mean of 41 mmHg  PVR 3.2 Woods units 3.  LVEDP of 20 mmHg Recommendation: Continue evaluation for surgical mitral valve intervention.   DG Orthopantogram  Result Date: 10/08/2022 CLINICAL DATA:  Preop dental evaluation. Scheduled for cardiac catheterization on Monday. EXAM: ORTHOPANTOGRAM/PANORAMIC COMPARISON:  None Available. FINDINGS: The mandible appears completely edentulous. Multiple upper teeth are missing. Dental amalgam is noted involving the right upper first and second molar. There is a large carie involving the  right upper first Muller. Dental caries are noted involving the left upper cuspid, left upper first bicuspid, and left upper first molar. There is a periapical lucency involving the left upper cuspid. IMPRESSION: Multiple dental caries. Periapical lucency involving the left upper cuspid may represent underlying abscess. Electronically Signed   By: Signa Kell M.D.   On: 10/08/2022 17:45    Cardiac Studies   Echo reviewed, see below  Patient Profile     62 y.o. male with PMH HIV, recent strep pneumo bacteremia with endocarditis of the mitral valve with severe MR  Assessment & Plan    Mitral valve endocarditis, subacute (admission 09/05/22) Severe MR -strep pneumonia endocarditis with large vegetation on mitral valve, initially diagnosed -cultures negative this admission -cath today with minimal CAD, RAP 11, RV 62/3, wedge 21 with V waves to 31, PA 64/22, PVR 3.2, :VEDP 20 -CT surgery following for severe MR given hypotension requiring midodrine, severely elevate PA pressures -repeat TTE 10/07/22 showed preserved EF, severely elevate PASP at 65.8 mmHg. Vegetation not visualized, but severe MR noted with partial flail versus perforation.  -reached out to CT surgery to see whether repeat TEE or any other testing needed prior to decision about surgery  HIV IVDA ETOH Multifocal pneumonia Meningitis Chronic hypotension, on midodrine -per primary team  For questions or updates, please contact  [facility] Please consult [email or web address] for contact info under        Signed, Jodelle Red, MD  10/10/2022, 4:12 PM

## 2022-10-10 NOTE — Assessment & Plan Note (Addendum)
Stable on Lexapro 20 mg, started inpatient 7/10. - Consider adding Wellbutrin if resistant after 4-6 weeks

## 2022-10-10 NOTE — Assessment & Plan Note (Signed)
Mitral valve vegetation from S. Pneumo.  Patient continues to remain afebrile.  Not a good candidate for outpatient IV therapy due to hx of IVDU.  TTE 8/1 showed severe PR, flail versus perforation of anterior leaflet.  No specific vegetations seen but this cannot be ruled out.  Cardiology to perform catheterization today 8/5 to rule out CAD.  CT surgery will reassess afterward about valve replacement plans. Will remain inpatient to complete IV antibiotics. - Continue 6 weeks of Rocephin (7/1-8/12) - Continue routine vitals - Follow up after cardiac catheterization - Cardiology and CT surgery following, appreciate recommendations after cath  Meningitis due to S. Pneumo.  No reported headaches and continued to remain afebrile. - Continue above antibiotics - BMP weekly (next on 8/7)

## 2022-10-10 NOTE — Interval H&P Note (Signed)
History and Physical Interval Note:  10/10/2022 7:08 AM  Christopher Farrell  has presented today for surgery, with the diagnosis of pre op.  The various methods of treatment have been discussed with the patient and family. After consideration of risks, benefits and other options for treatment, the patient has consented to  Procedure(s): RIGHT/LEFT HEART CATH AND CORONARY ANGIOGRAPHY (N/A) as a surgical intervention.  The patient's history has been reviewed, patient examined, no change in status, stable for surgery.  I have reviewed the patient's chart and labs.  Questions were answered to the patient's satisfaction.     [name]

## 2022-10-10 NOTE — Progress Notes (Signed)
Daily Progress Note Intern Pager: [phone]  Patient name: Christopher Farrell Medical record number: [id number] Date of birth: 04/02/1960 Age: 62 y.o. Gender: male  Primary Care Provider: Pcp, No Consultants: Cardiology, ID Code Status: FULL   Pt Overview and Major Events to Date:  7/1: Admission and Intubation  7/2: Brain MRI - multifocal acute ischemia in R>L cerebral hemispheres and cerebellum  7/4: Extubated 7/5: Transferred to [facility]MTS  7/8: TEE - MV multilobular vegetation  7/9: Prolonged IV Rocephin for 6 weeks total 7/10: Started Lexapro 10 mg 7/16: Brain MRI - similar artifact present need CT Head to r/o hemorrhage  7/17: CT head negative 7/19: repeated low BPs, started midodrine 5mg  7/21: Increased midodrine to 10 mg 7/24: Increase Lexapro to 20 mg 8/4: N.p.o. at midnight for cardiac catheterization on 8/5 8/5: cardiac cath to be performed   Assessment and Plan: Christopher Farrell is a 62 y.o. male who was admitted with streptococcus bacteremia with associated meningitis and MV endocarditis currently on 6 weeks of IV antibiotics inpatient due to IVDU hx.  [name]     * (Principal) Streptococcal bacteremia     Mitral valve vegetation from S. Pneumo.  Patient continues to remain  afebrile.  Not a good candidate for outpatient IV therapy due to hx of  IVDU.  TTE 8/1 showed severe PR, flail versus perforation of anterior  leaflet.  No specific vegetations seen but this cannot be ruled out.   Cardiology to perform catheterization today 8/5 to rule out CAD.  CT  surgery will reassess afterward about valve replacement plans. Will remain  inpatient to complete IV antibiotics. - Continue 6 weeks of Rocephin (7/1-8/12) - Continue routine vitals - Follow up after cardiac catheterization - Cardiology and CT surgery following, appreciate recommendations after  cath  Meningitis due to S. Pneumo.  No reported headaches and continued to  remain afebrile. - Continue  above antibiotics - BMP weekly (next on 8/7)        Human immunodeficiency virus (HIV) disease (HCC)     HIV load 8/1 showed quant of 40 copies of 1.602 (On 7/1 was 102K and  5.009 respectively). - Biktarvy 50-200-25 daily - ID follow-up outpatient         Anxiety and depression     Stable on Lexapro 20 mg, started inpatient 7/10. - Consider adding Wellbutrin if resistant after 4-6 weeks        Protein-calorie malnutrition, severe (HCC)     Severe malnutrition related to chronic illness (HIV) as evidenced by  severe fat depletion, severe muscle depletion.  - Weekly CBC, BMP, next phos 8/7 - RD following, last seen 7/31 - Regular diet - Continue Ensure 3 times daily - Mighty shakes 3 times daily with meals - Mighty cup 3 times daily with meals        Acute bacterial endocarditis     1.5 atrial mass with large vegetation of anterior mitral leaflet due to  S. Pneumo.  TTE 8/2 showed severe MVR, no vegetations seen on  echocardiogram but could not be ruled out.  Plan per principal problem. - Continue Rocephin for total of 6 weeks (7/1-8/12) - CT surgery and cardiology following, follow up recommendations after  cath today, 8/5        Hypotension     MAPs >65 past 24 hours. Stable. - Continue midodrine 10mg  TID        Anemia, chronic disease  Hgb stable 9.2. - Monitor with weekly CBC        Need for assessment by dentistry for poor dentition     Orthopantogram with multiple caries and periapical lucency involving  the left upper cuspid that may represent underlying abscess. On abx  therapy as above. Remains afebrile. May need dental evaluation before CT  surgery proceeds, depending on recs. - Follow CT surgery recs after cath today, 8/5 - Definite dental connection outpatient      FEN/GI: Regular diet PPx: No pharmacologic ppx, can ambulate- encourage OOB to chair q4h while awake Dispo:TBD. Completes IV abx 8/12, possible need for cardiothoracic surgery pending  cardiac cath today, 8/5.   Subjective:  Doing well. No concerns. Awaiting cardiac cath today.  Objective: Temp:  [97.7 F (36.5 C)-99.1 F (37.3 C)] 99.1 F (37.3 C) (08/05 0532) Pulse Rate:  [99-105] 99 (08/05 0532) Resp:  [18-20] 20 (08/05 0532) BP: (99-106)/(71-80) 99/74 (08/05 0532) SpO2:  [90 %-93 %] 90 % (08/05 0532) Weight:  [58.4 kg] 58.4 kg (08/05 2952) Physical Exam: General: Lying in bed, NAD Cardiovascular: Regular rate, 3/6 systolic murmur best appreciated at LSB Respiratory: CTAB anteriorly, speaking well on RA Abdomen: Soft, nondistended, nontender, hypoactive bowel sounds Extremities: Moves all grossly equally  Laboratory: Most recent CBC Lab Results  Component Value Date   WBC 7.8 10/10/2022   HGB 9.2 (L) 10/10/2022   HCT 29.0 (L) 10/10/2022   MCV 91.5 10/10/2022   PLT 310 10/10/2022   Most recent BMP    Latest Ref Rng & Units 10/10/2022    4:08 AM  BMP  Glucose 70 - 99 mg/dL 841   BUN 8 - 23 mg/dL 17   Creatinine 3.24 - 1.24 mg/dL 4.01   Sodium 027 - 253 mmol/L 137   Potassium 3.5 - 5.1 mmol/L 3.7   Chloride 98 - 111 mmol/L 107   CO2 22 - 32 mmol/L 21   Calcium 8.9 - 10.3 mg/dL 8.6    Christopher Georges, MD 10/10/2022, 8:01 AM PGY-2, [facility] FPTS Intern pager: [phone], text pages welcome Secure chat group [facility] Teaching Service

## 2022-10-10 NOTE — Progress Notes (Signed)
PT Cancellation Note  Patient Details Name: Christopher Farrell MRN: [id number] DOB: Jan 14, 1961   Cancelled Treatment:    Reason Eval/Treat Not Completed: Patient at procedure or test/unavailable (Pt off unit for cardiac cath. Will follow up at later date/time as pt able and schedule allows.)   Renaldo Fiddler PT, DPT Acute Rehabilitation Services Office [phone]  10/10/22 1:20 PM

## 2022-10-10 NOTE — Assessment & Plan Note (Addendum)
Severe malnutrition related to chronic illness (HIV) as evidenced by severe fat depletion, severe muscle depletion.  - Weekly CBC, BMP, next phos 8/7 - RD following, last seen 7/31 - Regular diet - Continue Ensure 3 times daily - Mighty shakes 3 times daily with meals - Mighty cup 3 times daily with meals

## 2022-10-10 NOTE — Assessment & Plan Note (Signed)
HIV load 8/1 showed quant of 40 copies of 1.602 (On 7/1 was 102K and 5.009 respectively). - Biktarvy 50-200-25 daily - ID follow-up outpatient

## 2022-10-11 ENCOUNTER — Encounter (HOSPITAL_COMMUNITY): Payer: Self-pay | Admitting: Internal Medicine

## 2022-10-11 ENCOUNTER — Encounter (HOSPITAL_COMMUNITY): Payer: Self-pay

## 2022-10-11 NOTE — Assessment & Plan Note (Addendum)
Stable, asymptomatic. - Monitor with weekly CBC

## 2022-10-11 NOTE — Assessment & Plan Note (Addendum)
No MV vegetation seen on TEE 8/7. Remains afebrile a poor candidate for outpatient IV antibiotics, will continue IV Rocephin on 08/12. - Continue Rocephin for total of 6 weeks (7/1-8/12) - Cardiology signed off, CT surgery requesting outpatient follow up in 4 weeks for MV surgery - Will arrange dental follow up for teeth extraction at outpatient PCP f/u

## 2022-10-11 NOTE — Assessment & Plan Note (Addendum)
Orthopantogram with multiple caries and periapical lucency involving the left upper cuspid that may represent underlying abscess.  On abx therapy as above.  Will need dental evaluation +/- extraction prior to CV surgery - Oral surgery follow up outpatient after PCP appointment for ?abscess and future CV surgery

## 2022-10-11 NOTE — Plan of Care (Signed)
  Problem: Activity: Goal: Risk for activity intolerance will decrease Outcome: Progressing   Problem: Nutrition: Goal: Adequate nutrition will be maintained Outcome: Progressing   Problem: Safety: Goal: Ability to remain free from injury will improve Outcome: Progressing   Problem: Skin Integrity: Goal: Risk for impaired skin integrity will decrease Outcome: Progressing   Problem: Self-Care: Goal: Ability to participate in self-care as condition permits will improve Outcome: Progressing

## 2022-10-11 NOTE — Progress Notes (Signed)
Daily Progress Note Intern Pager: [phone]  Patient name: Christopher Farrell Medical record number: [id number] Date of birth: [birth year] Age: 62 y.o. Gender: male  Primary Care Provider: Pcp, No Consultants: Cardiology, CT Surgery, ID, Oral Surgery Code Status: FULL  Pt Overview and Major Events to Date:  7/1: Admission and Intubation  7/2: Brain MRI - multifocal acute ischemia in R>L cerebral hemispheres and cerebellum  7/4: Extubated 7/5: Transferred to FMTS  7/8: TEE - MV multilobular vegetation  7/9: Prolonged IV Rocephin for 6 weeks total 7/10: Started Lexapro 10 mg 7/16: Brain MRI - similar artifact present need CT Head to r/o hemorrhage  7/17: CT head negative 7/19: repeated low BPs, started midodrine 5mg  7/21: Increased midodrine to 10 mg 7/24: Increase Lexapro to 20 mg 8/4: N.p.o. at midnight for cardiac catheterization on 8/5 8/5: Cardiac cath performed, no evidence of CAD 8/6: Oral surgery consulted, TEE planned tomorrow   Assessment and Plan: QUADRY MENOR is a 62 y.o. male with a pertinent PMH of IV drug use and HIV who was admitted with meningitis and MV endocarditis in the setting of streptococcus bacteremia, currently on 6 weeks of IV antibiotics and awaiting TEE and oral surgery evaluation.  -      Hospital     * (Principal) Streptococcal bacteremia     Mitral valve vegetation from S. Pneumo bacteremia.  Patient continues  to remain afebrile.  Not a good candidate for outpatient IV therapy due to  hx of IVDU.  TTE 8/1 showed severe PR, flail versus perforation of  anterior leaflet.  Cardiac cath without concern for CAD.  TEE scheduled  prior to CT surgery decision concerning valvular surgery.  Remains  inpatient to continue IV antibiotics. - Continue 6 weeks of Rocephin (7/1-8/12) - Cardiology and CT surgery following, f/u CT recs after TEE - NPO @ midnight  Meningitis also due to S. Pneumo on admission.  No reported headaches and  continued  to remain afebrile. - Continue above antibiotics - BMP weekly (next on 8/7)        Human immunodeficiency virus (HIV) disease (HCC)     HIV load 8/1 showed quant of 40 copies of 1.602 (On 7/1 was 102K and  5.009 respectively). - Biktarvy 50-200-25 daily - ID follow-up outpatient         Anxiety and depression     Stable on Lexapro 20 mg, started inpatient 7/10. - Consider adding Wellbutrin if resistant after 4-6 weeks        Protein-calorie malnutrition, severe (HCC)     Severe malnutrition related to chronic illness (HIV) as evidenced by  severe fat depletion, severe muscle depletion.  - Weekly CBC, BMP, next phos 8/7 - RD following, last seen 7/31 - Regular diet - Continue Ensure 3 times daily - Mighty shakes 3 times daily with meals - Mighty cup 3 times daily with meals        Acute bacterial endocarditis     1.5 atrial mass with large vegetation of anterior mitral leaflet due to  S. Pneumo.  TEE scheduled 8/7.  Plan per principal problem. - Continue Rocephin for total of 6 weeks (7/1-8/12) - CT surgery and cardiology following, follow up CT recs after TEE 8/7        Hypotension     MAPs >65 past 24 hours. Stable. - Continue midodrine 10mg  TID        Anemia, chronic disease     Hgb stable slowly trending down  to 8.8, mostly stable. - Monitor with weekly CBC        Need for assessment by dentistry for poor dentition     Orthopantogram with multiple caries and periapical lucency involving  the left upper cuspid that may represent underlying abscess.  On abx  therapy as above. Remains afebrile. Should consider dental evaluation  before CT surgery proceeds, per their recs. - Oral surgery consult today for ?abscess and future CV surgery        Severe mitral regurgitation     History of endocarditis     Pulmonary hypertension (HCC)     FEN/GI: Regular diet, protein calorie supplementation as above, no maintenance fluids PPx: No pharmacological Ppx, encourage  OOB to chair Q4h when awake Dispo: Possible CT surgery pending TEE 8/7. Barriers to discharge include IV antibiotics until 8/12.  Subjective:  Patient is not talkative this morning, states he is feeling "fine."  Denies headache, chills, pain, weakness, shortness of breath, chest pain. Has been doing his best to eat well and get OOB during the day.  Objective: Temp:  [97.4 F (36.3 C)-98 F (36.7 C)] 97.5 F (36.4 C) (08/06 0753) Pulse Rate:  [94-103] 99 (08/06 0753) Resp:  [17-22] 18 (08/06 0753) BP: (94-106)/(78-81) 103/80 (08/06 0753) SpO2:  [91 %-100 %] 100 % (08/06 0753) Weight:  [57 kg] 57 kg (08/06 0355)  Physical Exam: General: Age-appropriate, resting comfortably in bed, thin, deconditioned male. NAD, alert and at baseline. HEENT: No lower teeth, upper teeth with significant decay and carries.  MMM.  No tenderness to palpation of jaws externally. Cardiovascular: Intermittently tachycardic, regular rhythm. 3/6 systolic ejection murmur. No rubs or gallops appreciated. 2+ radial pulses. Pulmonary: Clear bilaterally to ascultation. No increased WOB, no accessory muscle usage on room air. No wheezes, rales, or crackles. Skin: Warm and dry. Extremities: No peripheral edema bilaterally.  Laboratory: Most recent CBC Lab Results  Component Value Date   WBC 7.8 10/10/2022   HGB 8.8 (L) 10/10/2022   HCT 26.0 (L) 10/10/2022   MCV 91.5 10/10/2022   PLT 310 10/10/2022   Most recent BMP    Latest Ref Rng & Units 10/10/2022   11:24 AM  BMP  Sodium 135 - 145 mmol/L 139   Potassium 3.5 - 5.1 mmol/L 3.3    Other pertinent labs: - None  New Imaging/Diagnostic Tests: - None   , MD 10/11/2022, 2:13 PM [facility]  FMTS Intern pager: [phone], text pages welcome Secure chat group [facility] Teaching Service

## 2022-10-11 NOTE — Progress Notes (Signed)
PT Cancellation Note  Patient Details Name: Christopher Farrell MRN: [id number] DOB: [birth year]   Cancelled Treatment:    Reason Eval/Treat Not Completed: (P) Other (comment), pt declining all mobility stating he "already did it earlier" and "has been walking a lot". Will check back as schedule allows to continue with PT POC.  Lenora Boys. PTA Acute Rehabilitation Services Office: [phone]    Catalina Antigua 10/11/2022, 3:27 PM

## 2022-10-11 NOTE — Assessment & Plan Note (Addendum)
S. Pneumo bacteremia on admission with endocarditis and meningitis sequelae, now resolved.  Patient continues to remain afebrile, no neuro/cardiac symptoms and physical exam benign except MR murmur. - Plan per primary problem - Weekly BMP (next 8/14)

## 2022-10-11 NOTE — Progress Notes (Signed)
OT Cancellation Note  Patient Details Name: Christopher Farrell MRN: [id number] DOB: [birth year]   Cancelled Treatment:    Reason Eval/Treat Not Completed: Patient declined, no reason specified Pt states "I already did that today"  [name] 10/11/2022, 2:53 PM

## 2022-10-11 NOTE — Assessment & Plan Note (Addendum)
Soft BP is with appropriate MAP.  Overall asymptomatic. - Midodrine at 5 mg TID, weaning in progress

## 2022-10-11 NOTE — Progress Notes (Signed)
Rounding Note    Patient Name: Christopher Farrell Date of Encounter: 10/11/2022  [facility] Cardiologist: Dr. Duke Salvia  Subjective   No acute events overnight. Discussed CT surgery request for TEE. He is amenable but is hopeful to have surgery soon. He notes that he is active at baseline, was working, has been walking the halls.   Inpatient Medications    Scheduled Meds:  bictegravir-emtricitabine-tenofovir AF  1 tablet Oral Daily   Chlorhexidine Gluconate Cloth  6 each Topical Daily   diclofenac Sodium  4 g Topical QID   enoxaparin (LOVENOX) injection  30 mg Subcutaneous Q24H   escitalopram  20 mg Oral Daily   feeding supplement  237 mL Oral TID BM   lidocaine  1 patch Transdermal Daily   melatonin  3 mg Oral QHS   midodrine  10 mg Oral TID WC   sodium chloride flush  10-40 mL Intracatheter Q12H   thiamine  100 mg Oral Daily   Continuous Infusions:  sodium chloride Stopped (10/06/22 1032)   cefTRIAXone (ROCEPHIN)  IV 2 g (10/10/22 0907)   PRN Meds: acetaminophen, ALPRAZolam, loperamide, ondansetron (ZOFRAN) IV, sodium chloride flush, zolpidem   Vital Signs    Vitals:   10/10/22 1934 10/11/22 0355 10/11/22 0516 10/11/22 0753  BP: 106/81  94/78 103/80  Pulse: (!) 103  96 99  Resp: 18  17 18   Temp: (!) 97.4 F (36.3 C)  98 F (36.7 C) (!) 97.5 F (36.4 C)  TempSrc: Oral  Oral Oral  SpO2: 96%  99% 100%  Weight:  57 kg    Height:        Intake/Output Summary (Last 24 hours) at 10/11/2022 1037 Last data filed at 10/11/2022 0326 Gross per 24 hour  Intake --  Output 725 ml  Net -725 ml      10/11/2022    3:55 AM 10/10/2022    6:52 AM 10/09/2022    5:57 AM  Last 3 Weights  Weight (lbs) 125 lb 10.6 oz 128 lb 12 oz 121 lb 14.6 oz  Weight (kg) 57 kg 58.4 kg 55.3 kg      Telemetry    Not on telemetry - Personally Reviewed  ECG    No new - Personally Reviewed  Physical Exam   GEN: Well nourished, well developed in no acute distress NECK: No  JVD CARDIAC: regular rhythm, normal S1 and S2, no rubs or gallops. 4/6 holosystolic murmur. VASCULAR: Radial pulses 2+ bilaterally.  RESPIRATORY:  Clear to auscultation without rales, wheezing or rhonchi  ABDOMEN: Soft, non-tender, non-distended MUSCULOSKELETAL:  Moves all 4 limbs independently SKIN: Warm and dry, no edema NEUROLOGIC:  No focal neuro deficits noted. PSYCHIATRIC:  Normal affect    Labs    High Sensitivity Troponin:  No results for input(s): "TROPONINIHS" in the last 720 hours.   Chemistry Recent Labs  Lab 10/05/22 0313 10/10/22 0408 10/10/22 1120 10/10/22 1122 10/10/22 1124  NA 137 137 141 142 139  K 3.9 3.7 3.7 3.7 3.3*  CL 102 107  --   --   --   CO2 24 21*  --   --   --   GLUCOSE 109* 109*  --   --   --   BUN 13 17  --   --   --   CREATININE 0.71 0.75  --   --   --   CALCIUM 8.7* 8.6*  --   --   --   GFRNONAA >60 >60  --   --   --  ANIONGAP 11 9  --   --   --     Lipids No results for input(s): "CHOL", "TRIG", "HDL", "LABVLDL", "LDLCALC", "CHOLHDL" in the last 168 hours.  Hematology Recent Labs  Lab 10/05/22 0313 10/10/22 0408 10/10/22 1120 10/10/22 1122 10/10/22 1124  WBC 9.3 7.8  --   --   --   RBC 3.28* 3.17*  --   --   --   HGB 9.7* 9.2* 8.8* 8.8* 8.8*  HCT 30.0* 29.0* 26.0* 26.0* 26.0*  MCV 91.5 91.5  --   --   --   MCH 29.6 29.0  --   --   --   MCHC 32.3 31.7  --   --   --   RDW 16.9* 17.5*  --   --   --   PLT 289 310  --   --   --    Thyroid No results for input(s): "TSH", "FREET4" in the last 168 hours.  BNPNo results for input(s): "BNP", "PROBNP" in the last 168 hours.  DDimer No results for input(s): "DDIMER" in the last 168 hours.   Radiology    CARDIAC CATHETERIZATION  Result Date: 10/10/2022 1.  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 2.  Fick cardiac output of 6.2 L/min and Fick cardiac index of 3.9 L/min/m with the following hemodynamics:  Right atrial pressure mean of 11 mmHg  RV pressure 62/3  with an end-diastolic pressure of 14 mmHg  Wedge pressure mean of 21 mmHg with V waves to 31 mmHg  PA pressure 64/22 with a mean of 41 mmHg  PVR 3.2 Woods units 3.  LVEDP of 20 mmHg Recommendation: Continue evaluation for surgical mitral valve intervention.    Cardiac Studies   Echo reviewed, see below  Patient Profile     62 y.o. male with PMH HIV, recent strep pneumo bacteremia with endocarditis of the mitral valve with severe MR  Assessment & Plan    Mitral valve endocarditis, subacute (admission 09/05/22) Severe MR -strep pneumonia endocarditis with large vegetation on mitral valve, initially diagnosed 09/2022 -cath 8/5 with minimal CAD, RAP 11, RV 62/3, wedge 21 with V waves to 31, PA 64/22, PVR 3.2, :VEDP 20 -CT surgery following for severe MR given hypotension requiring midodrine, severely elevate PA pressures -repeat TTE 10/07/22 showed preserved EF, severely elevate PASP at 65.8 mmHg. Vegetation not visualized, but severe MR noted with partial flail versus perforation.  -CT surgery would like repeat TEE prior to decision on surgery. Scheduled for tomorrow. Discussed with patient, he is amenable.  Informed Consent   Shared Decision Making/Informed Consent The risks [esophageal damage, perforation (1:10,000 risk), bleeding, pharyngeal hematoma as well as other potential complications associated with conscious sedation including aspiration, arrhythmia, respiratory failure and death], benefits (treatment guidance and diagnostic support) and alternatives of a transesophageal echocardiogram were discussed in detail with Mr. Kirton and he is willing to proceed.       -CT surgery noted need for dental extractions, potential need for rehab prior to surgery. Will discuss with primary team. He tells me he is active at baseline. From a cardiac perspective, he is ok for discharge after TEE if surgery is not planned imminently  HIV IVDA ETOH Multifocal pneumonia Meningitis Chronic  hypotension, on midodrine -per primary team  For questions or updates, please contact [facility] Please consult [email or web address] for contact info under        Signed, Jodelle Red, MD  10/11/2022, 10:37 AM

## 2022-10-12 ENCOUNTER — Inpatient Hospital Stay (HOSPITAL_COMMUNITY): Payer: Medicaid Other | Admitting: Anesthesiology

## 2022-10-12 ENCOUNTER — Inpatient Hospital Stay (HOSPITAL_COMMUNITY): Payer: Medicaid Other

## 2022-10-12 ENCOUNTER — Encounter (HOSPITAL_COMMUNITY)
Admission: EM | Disposition: A | Discharge status: Home Health | Payer: Self-pay | Source: Home / Self Care | Attending: Family Medicine

## 2022-10-12 ENCOUNTER — Encounter (HOSPITAL_COMMUNITY): Payer: Self-pay | Admitting: Pulmonary Disease

## 2022-10-12 ENCOUNTER — Inpatient Hospital Stay (HOSPITAL_COMMUNITY)
Admit: 2022-10-12 | Discharge: 2022-10-12 | Disposition: A | Discharge status: Home / Self Care | Payer: Medicaid Other | Attending: Cardiology | Admitting: Cardiology

## 2022-10-12 DIAGNOSIS — I340 Nonrheumatic mitral (valve) insufficiency: Secondary | ICD-10-CM

## 2022-10-12 DIAGNOSIS — I349 Nonrheumatic mitral valve disorder, unspecified: Secondary | ICD-10-CM

## 2022-10-12 DIAGNOSIS — Z87891 Personal history of nicotine dependence: Secondary | ICD-10-CM

## 2022-10-12 DIAGNOSIS — F418 Other specified anxiety disorders: Secondary | ICD-10-CM

## 2022-10-12 DIAGNOSIS — I10 Essential (primary) hypertension: Secondary | ICD-10-CM

## 2022-10-12 HISTORY — PX: TEE WITHOUT CARDIOVERSION: SHX5443

## 2022-10-12 LAB — PULMONARY FUNCTION TEST
FEV6-%Pred-Pre: 82 %
Pre FEV1/FVC ratio: 69 %

## 2022-10-12 LAB — ECHO TEE
MV M vel: 4.48 m/s
MV Peak grad: 80.3 mmHg
Radius: 1.1 cm

## 2022-10-12 SURGERY — ECHOCARDIOGRAM, TRANSESOPHAGEAL
Anesthesia: Monitor Anesthesia Care

## 2022-10-12 MED ORDER — PROPOFOL 10 MG/ML IV BOLUS
INTRAVENOUS | Status: DC | PRN
Start: 2022-10-12 — End: 2022-10-12
  Administered 2022-10-12: 50 mg via INTRAVENOUS

## 2022-10-12 MED ORDER — LIDOCAINE 2% (20 MG/ML) 5 ML SYRINGE
INTRAMUSCULAR | Status: DC | PRN
  Administered 2022-10-12: 20 mg via INTRAVENOUS

## 2022-10-12 MED ORDER — SODIUM CHLORIDE 0.9 % IV SOLN: INTRAVENOUS | Status: DC

## 2022-10-12 MED ORDER — MIDODRINE HCL 5 MG PO TABS
5.0000 mg | ORAL_TABLET | Freq: Three times a day (TID) | ORAL | Status: DC
  Administered 2022-10-12 – 2022-10-17 (×16): 5 mg via ORAL
  Filled 2022-10-12 (×17): qty 1

## 2022-10-12 MED ORDER — PROPOFOL 500 MG/50ML IV EMUL
INTRAVENOUS | Status: DC | PRN
  Administered 2022-10-12: 80 ug/kg/min via INTRAVENOUS

## 2022-10-12 NOTE — Plan of Care (Signed)
  Problem: Nutritional: Goal: Maintenance of adequate nutrition will improve Outcome: Progressing   Problem: Skin Integrity: Goal: Risk for impaired skin integrity will decrease Outcome: Progressing   Problem: Education: Goal: Knowledge of General Education information will improve Description: Including pain rating scale, medication(s)/side effects and non-pharmacologic comfort measures Outcome: Progressing   Problem: Activity: Goal: Risk for activity intolerance will decrease Outcome: Progressing   Problem: Nutrition: Goal: Adequate nutrition will be maintained Outcome: Progressing   Problem: Safety: Goal: Ability to remain free from injury will improve Outcome: Progressing   Problem: Skin Integrity: Goal: Risk for impaired skin integrity will decrease Outcome: Progressing   Problem: Activity: Goal: Ability to return to baseline activity level will improve Outcome: Progressing

## 2022-10-12 NOTE — Transfer of Care (Signed)
Immediate Anesthesia Transfer of Care Note  Patient: Christopher Farrell  Procedure(s) Performed: TRANSESOPHAGEAL ECHOCARDIOGRAM  Patient Location: Endoscopy Unit  Anesthesia Type:MAC  Level of Consciousness: awake, alert , oriented, and drowsy  Airway & Oxygen Therapy: Patient Spontanous Breathing  Post-op Assessment: Report given to RN and Post -op Vital signs reviewed and stable  Post vital signs: Reviewed and stable  Last Vitals:  Vitals Value Taken Time  BP 89/66 10/12/22 1226  Temp    Pulse 90 10/12/22 1228  Resp 33 10/12/22 1228  SpO2 91 % 10/12/22 1228  Vitals shown include unfiled device data.  Last Pain:  Vitals:   10/12/22 1027  TempSrc:   PainSc: 0-No pain      Patients Stated Pain Goal: 0 (09/29/22 9381)  Complications: No notable events documented.

## 2022-10-12 NOTE — Anesthesia Procedure Notes (Signed)
Procedure Name: MAC Date/Time: 10/12/2022 11:50 AM  Performed by: Laruth Bouchard., CRNAPre-anesthesia Checklist: Patient identified, Emergency Drugs available, Suction available, Timeout performed and Patient being monitored Patient Re-evaluated:Patient Re-evaluated prior to induction Oxygen Delivery Method: Nasal cannula Preoxygenation: Pre-oxygenation with 100% oxygen Induction Type: IV induction Placement Confirmation: positive ETCO2

## 2022-10-12 NOTE — Progress Notes (Signed)
Rounding Note    Patient Name: Christopher Farrell Date of Encounter: 10/12/2022  [facility] Cardiologist: Dr. Duke Salvia  Subjective   No acute events overnight. Contacted that he had concerns about the procedure--spoke with him, his only concern was about waiting until 2 PM. Discussed that we should probably be able to do it sooner. He is amenable to proceed.  Inpatient Medications    Scheduled Meds:  bictegravir-emtricitabine-tenofovir AF  1 tablet Oral Daily   Chlorhexidine Gluconate Cloth  6 each Topical Daily   diclofenac Sodium  4 g Topical QID   enoxaparin (LOVENOX) injection  30 mg Subcutaneous Q24H   escitalopram  20 mg Oral Daily   feeding supplement  237 mL Oral TID BM   lidocaine  1 patch Transdermal Daily   melatonin  3 mg Oral QHS   midodrine  10 mg Oral TID WC   sodium chloride flush  10-40 mL Intracatheter Q12H   thiamine  100 mg Oral Daily   Continuous Infusions:  sodium chloride Stopped (10/06/22 1032)   cefTRIAXone (ROCEPHIN)  IV Stopped (10/11/22 1110)   PRN Meds: acetaminophen, ALPRAZolam, loperamide, ondansetron (ZOFRAN) IV, sodium chloride flush, zolpidem   Vital Signs    Vitals:   10/11/22 1640 10/11/22 2043 10/12/22 0357 10/12/22 0735  BP: 96/79 105/73 97/79 102/78  Pulse: 98 (!) 101 96 95  Resp: 18 20 20 18   Temp: (!) 97.5 F (36.4 C) 98.7 F (37.1 C) 98.7 F (37.1 C) 98 F (36.7 C)  TempSrc: Oral Oral Oral Oral  SpO2: 98% 93% 93% 91%  Weight:      Height:        Intake/Output Summary (Last 24 hours) at 10/12/2022 0913 Last data filed at 10/12/2022 0831 Gross per 24 hour  Intake --  Output 600 ml  Net -600 ml      10/11/2022    3:55 AM 10/10/2022    6:52 AM 10/09/2022    5:57 AM  Last 3 Weights  Weight (lbs) 125 lb 10.6 oz 128 lb 12 oz 121 lb 14.6 oz  Weight (kg) 57 kg 58.4 kg 55.3 kg      Telemetry    Not on telemetry - Personally Reviewed  ECG    No new - Personally Reviewed  Physical Exam   GEN: Well  nourished, well developed in no acute distress NECK: No JVD CARDIAC: regular rhythm, normal S1 and S2, no rubs or gallops. 4/6 holosystolic murmur. VASCULAR: Radial pulses 2+ bilaterally.  RESPIRATORY:  Clear to auscultation without rales, wheezing or rhonchi  ABDOMEN: Soft, non-tender, non-distended MUSCULOSKELETAL:  Moves all 4 limbs independently SKIN: Warm and dry, no edema NEUROLOGIC:  No focal neuro deficits noted. PSYCHIATRIC:  Normal affect    Labs    High Sensitivity Troponin:  No results for input(s): "TROPONINIHS" in the last 720 hours.   Chemistry Recent Labs  Lab 10/10/22 0408 10/10/22 1120 10/10/22 1122 10/10/22 1124 10/12/22 0500  NA 137   < > 142 139 136  K 3.7   < > 3.7 3.3* 4.0  CL 107  --   --   --  103  CO2 21*  --   --   --  21*  GLUCOSE 109*  --   --   --  122*  BUN 17  --   --   --  11  CREATININE 0.75  --   --   --  0.73  CALCIUM 8.6*  --   --   --  8.7*  GFRNONAA >60  --   --   --  >60  ANIONGAP 9  --   --   --  12   < > = values in this interval not displayed.    Lipids No results for input(s): "CHOL", "TRIG", "HDL", "LABVLDL", "LDLCALC", "CHOLHDL" in the last 168 hours.  Hematology Recent Labs  Lab 10/10/22 0408 10/10/22 1120 10/10/22 1122 10/10/22 1124 10/12/22 0500  WBC 7.8  --   --   --  6.5  RBC 3.17*  --   --   --  3.04*  HGB 9.2*   < > 8.8* 8.8* 9.3*  HCT 29.0*   < > 26.0* 26.0* 28.6*  MCV 91.5  --   --   --  94.1  MCH 29.0  --   --   --  30.6  MCHC 31.7  --   --   --  32.5  RDW 17.5*  --   --   --  17.7*  PLT 310  --   --   --  302   < > = values in this interval not displayed.   Thyroid No results for input(s): "TSH", "FREET4" in the last 168 hours.  BNPNo results for input(s): "BNP", "PROBNP" in the last 168 hours.  DDimer No results for input(s): "DDIMER" in the last 168 hours.   Radiology    CARDIAC CATHETERIZATION  Result Date: 10/10/2022 1.  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 2.   Fick cardiac output of 6.2 L/min and Fick cardiac index of 3.9 L/min/m with the following hemodynamics:  Right atrial pressure mean of 11 mmHg  RV pressure 62/3 with an end-diastolic pressure of 14 mmHg  Wedge pressure mean of 21 mmHg with V waves to 31 mmHg  PA pressure 64/22 with a mean of 41 mmHg  PVR 3.2 Woods units 3.  LVEDP of 20 mmHg Recommendation: Continue evaluation for surgical mitral valve intervention.    Cardiac Studies   Echo reviewed, see below  Patient Profile     62 y.o. male with PMH HIV, recent strep pneumo bacteremia with endocarditis of the mitral valve with severe MR  Assessment & Plan    Mitral valve endocarditis, subacute (admission 09/05/22) Severe MR -strep pneumonia endocarditis with large vegetation on mitral valve, initially diagnosed 09/2022 -cath 8/5 with minimal CAD, RAP 11, RV 62/3, wedge 21 with V waves to 31, PA 64/22, PVR 3.2, :VEDP 20 -CT surgery following for severe MR given hypotension requiring midodrine, severely elevate PA pressures -repeat TTE 10/07/22 showed preserved EF, severely elevate PASP at 65.8 mmHg. Vegetation not visualized, but severe MR noted with partial flail versus perforation.  -CT surgery would like repeat TEE prior to decision on surgery. Planned for today. Discussed with patient, he is amenable.  Informed Consent   Shared Decision Making/Informed Consent The risks [esophageal damage, perforation (1:10,000 risk), bleeding, pharyngeal hematoma as well as other potential complications associated with conscious sedation including aspiration, arrhythmia, respiratory failure and death], benefits (treatment guidance and diagnostic support) and alternatives of a transesophageal echocardiogram were discussed in detail with Mr. Czech and he is willing to proceed.       -From a cardiac perspective, he is ok for discharge after TEE if surgery is not planned imminently  HIV IVDA ETOH Multifocal pneumonia Meningitis Chronic hypotension,  on midodrine -per primary team  For questions or updates, please contact [facility] Please consult [email or web address] for contact info under  Signed, Jodelle Red, MD  10/12/2022, 9:13 AM

## 2022-10-12 NOTE — Anesthesia Postprocedure Evaluation (Signed)
Anesthesia Post Note  Patient: Christopher Farrell  Procedure(s) Performed: TRANSESOPHAGEAL ECHOCARDIOGRAM     Patient location during evaluation: PACU Anesthesia Type: MAC Level of consciousness: awake and alert Pain management: pain level controlled Vital Signs Assessment: post-procedure vital signs reviewed and stable Respiratory status: spontaneous breathing, nonlabored ventilation and respiratory function stable Cardiovascular status: stable and blood pressure returned to baseline Anesthetic complications: no   No notable events documented.  Last Vitals:  Vitals:   10/12/22 1255 10/12/22 1309  BP: 96/86 91/76  Pulse: 90 92  Resp: (!) 28   Temp:  (!) 36.2 C  SpO2: 96% 95%    Last Pain:  Vitals:   10/12/22 1309  TempSrc: Oral  PainSc:                  Beryle Lathe

## 2022-10-12 NOTE — Progress Notes (Signed)
OT Cancellation Note  Patient Details Name: Christopher Farrell MRN: [id number] DOB: May 24, 1960   Cancelled Treatment:    Reason Eval/Treat Not Completed: Patient at procedure or test/ unavailable (Will return as schedule allows.)  Tyler Deis, OTR/L [facility] Acute Rehabilitation Office: [phone]   Myrla Halsted 10/12/2022, 10:55 AM

## 2022-10-12 NOTE — CV Procedure (Signed)
    TRANSESOPHAGEAL ECHOCARDIOGRAM   NAME:  Christopher Farrell    MRN: [id number] DOB:  1960/09/12    ADMIT DATE: 09/05/2022  INDICATIONS: Severe Mitral regurgitation.  PROCEDURE:   Informed consent was obtained prior to the procedure. The risks, benefits and alternatives for the procedure were discussed and the patient comprehended these risks.  Risks include, but are not limited to, cough, sore throat, vomiting, nausea, somnolence, esophageal and stomach trauma or perforation, bleeding, low blood pressure, aspiration, pneumonia, infection, trauma to the teeth and death.    Procedural time out performed. The oropharynx was anesthetized with viscous lidocaine.  Anesthesia was administered by the anaesthesilogy team to achieve and maintain moderate to deep conscious sedation.  The patient's heart rate, blood pressure, and oxygen saturation were monitored continuously during the procedure.  The transesophageal probe was inserted in the esophagus and stomach without difficulty and multiple views were obtained.   The patient tolerated the procedure well.  COMPLICATIONS:    There were no immediate complications.  KEY FINDINGS:  Normal LVEF, Severe mitral regurgitation, flail anterior mitral leaflet. Moderate tricuspid regurgitation.  Full report to follow. Further management per primary team.   Thomasene Ripple, DO [facility]  [facility]  1:21 PM

## 2022-10-12 NOTE — Anesthesia Preprocedure Evaluation (Addendum)
Anesthesia Evaluation  Patient identified by MRN, date of birth, ID band Patient awake    Reviewed: Allergy & Precautions, NPO status , Patient's Chart, lab work & pertinent test results  History of Anesthesia Complications Negative for: history of anesthetic complications  Airway Mallampati: II  TM Distance: >3 FB Neck ROM: Full    Dental  (+) Dental Advisory Given, Edentulous Lower Unable to assess:   Pulmonary Current Smoker and Patient abstained from smoking.   Pulmonary exam normal        Cardiovascular pulmonary hypertension+ Valvular Problems/Murmurs MR  Rhythm:Regular Rate:Normal + Systolic murmurs  '24 TTE - EF 60 to 65%. Grade III diastolic dysfunction (restrictive). There is severely elevated pulmonary artery systolic pressure. The estimated right ventricular systolic pressure is 65.8 mmHg. Left atrial size was severely dilated. The mitral valve is abnormal. The anterior leaflet has partial flail versus perforation. I do not see a definite vegetation but cannot rule out. Severe mitral valve regurgitation. Tricuspid valve regurgitation is moderate.     Neuro/Psych Seizures -,  PSYCHIATRIC DISORDERS Anxiety Depression    CVA    GI/Hepatic negative GI ROS,,,(+)     substance abuse  cocaine use and marijuana use  Endo/Other    Renal/GU negative Renal ROS     Musculoskeletal negative musculoskeletal ROS (+)    Abdominal   Peds  Hematology  (+) Blood dyscrasia, anemia , HIV  Anesthesia Other Findings HSV  Reproductive/Obstetrics                             Anesthesia Physical Anesthesia Plan  ASA: 4  Anesthesia Plan: MAC   Post-op Pain Management: Minimal or no pain anticipated   Induction:   PONV Risk Score and Plan: 0 and Propofol infusion and Treatment may vary due to age or medical condition  Airway Management Planned: Nasal Cannula and Natural Airway  Additional  Equipment: None  Intra-op Plan:   Post-operative Plan:   Informed Consent: I have reviewed the patients History and Physical, chart, labs and discussed the procedure including the risks, benefits and alternatives for the proposed anesthesia with the patient or authorized representative who has indicated his/her understanding and acceptance.       Plan Discussed with: CRNA and Anesthesiologist  Anesthesia Plan Comments:         Anesthesia Quick Evaluation

## 2022-10-12 NOTE — Progress Notes (Signed)
Daily Progress Note Intern Pager: [phone]  Patient name: Christopher Farrell Medical record number: [id number] Date of birth: [birth year] Age: 62 y.o. Gender: male  Primary Care Provider: Pcp, No Consultants: Cardiology, CT Surgery, ID, Oral Surgery  Code Status: FULL  Pt Overview and Major Events to Date:  7/1: Admission and Intubation  7/2: Brain MRI - multifocal acute ischemia in R>L cerebral hemispheres and cerebellum  7/4: Extubated 7/5: Transferred to FMTS  7/8: TEE - MV multilobular vegetation  7/9: Prolonged IV Rocephin for 6 weeks total 7/10: Started Lexapro 10 mg 7/16: Brain MRI - similar artifact present need CT Head to r/o hemorrhage  7/17: CT head negative 7/19: repeated low BPs, started midodrine 5mg  7/21: Increased midodrine to 10 mg 7/24: Increase Lexapro to 20 mg 8/4: N.p.o. at midnight for cardiac catheterization on 8/5 8/5: Cardiac cath performed, no evidence of CAD 8/6: Oral surgery consulted 8/7: PFTs, TEE   Assessment and Plan: Christopher Farrell is a 62 y.o. male with a pertinent PMH of IV drug use and HIV who was admitted with meningitis and MV endocarditis in the setting of streptococcus bacteremia, currently on 6 weeks of IV antibiotics and awaiting TEE and oral surgery evaluation.  -      Hospital     * (Principal) Streptococcal bacteremia     Mitral valve vegetation from S. Pneumo bacteremia.  Patient continues  to remain afebrile.  Not a good candidate for outpatient IV therapy due to  hx of IVDU.  TTE 8/1 showed severe PR, flail versus perforation of  anterior leaflet.  Cardiac cath without concern for CAD.  TEE scheduled  prior to CT surgery decision concerning valvular surgery.  Remains  inpatient to continue IV antibiotics. - Continue 6 weeks of Rocephin (7/1-8/12) - Cardiology and CT surgery following, f/u CT recs after TEE - NPO @ midnight  Meningitis also due to S. Pneumo on admission.  No reported headaches and  continued to  remain afebrile. - Continue above antibiotics - BMP weekly (next on 8/7)        Human immunodeficiency virus (HIV) disease (HCC)     HIV load 8/1 showed quant of 40 copies of 1.602 (On 7/1 was 102K and  5.009 respectively). - Biktarvy 50-200-25 daily - ID follow-up outpatient         Anxiety and depression     Stable on Lexapro 20 mg, started inpatient 7/10. - Consider adding Wellbutrin if resistant after 4-6 weeks        Protein-calorie malnutrition, severe (HCC)     Severe malnutrition related to chronic illness (HIV) as evidenced by  severe fat depletion, severe muscle depletion.  - Weekly CBC, BMP, next phos 8/7 - RD following, last seen 7/31 - Regular diet - Continue Ensure 3 times daily - Mighty shakes 3 times daily with meals - Mighty cup 3 times daily with meals        Acute bacterial endocarditis     1.5 atrial mass with large vegetation of anterior mitral leaflet due to  S. Pneumo.  TEE scheduled 8/7.  Plan per principal problem. - Continue Rocephin for total of 6 weeks (7/1-8/12) - CT surgery and cardiology following, follow up CT recs after TEE 8/7        Hypotension     MAPs >65 past 24 hours. Stable currently, SBP around 90-100.  Will  begin weaning for home. - Decrease midodrine to 5 mg TID, reevaluate for hypotension symptoms.  Anemia, chronic disease     Hgb stable at 9.3. - Monitor with weekly CBC        Need for assessment by dentistry for poor dentition     Orthopantogram with multiple caries and periapical lucency involving  the left upper cuspid that may represent underlying abscess.  On abx  therapy as above. Remains afebrile. Should consider dental evaluation  before CT surgery proceeds, per their recs. - Oral surgery consult today for ?abscess and future CV surgery        Severe mitral regurgitation     History of endocarditis     Pulmonary hypertension (HCC)   FEN/GI: Regular diet, protein calorie supplementation as above, no  maintenance fluids PPx: No pharmacological Ppx, encourage OOB to chair Q4h when awake Dispo: Possible CT surgery pending TEE 8/7. Barriers to discharge include IV antibiotics until 8/12.  Subjective:  This morning, patient is in the   Objective: Temp:  [97.5 F (36.4 C)-98.7 F (37.1 C)] 97.6 F (36.4 C) (08/07 1225) Pulse Rate:  [87-101] 96 (08/07 1235) Resp:  [14-25] 21 (08/07 1235) BP: (83-105)/(66-79) 92/76 (08/07 1235) SpO2:  [89 %-99 %] 92 % (08/07 1235)  Physical Exam: Deferred due to patient getting PFTs followed by TEE.  Laboratory: Most recent CBC Lab Results  Component Value Date   WBC 6.5 10/12/2022   HGB 9.3 (L) 10/12/2022   HCT 28.6 (L) 10/12/2022   MCV 94.1 10/12/2022   PLT 302 10/12/2022   Most recent BMP    Latest Ref Rng & Units 10/12/2022    5:00 AM  BMP  Glucose 70 - 99 mg/dL 161   BUN 8 - 23 mg/dL 11   Creatinine 0.96 - 1.24 mg/dL 0.45   Sodium 409 - 811 mmol/L 136   Potassium 3.5 - 5.1 mmol/L 4.0   Chloride 98 - 111 mmol/L 103   CO2 22 - 32 mmol/L 21   Calcium 8.9 - 10.3 mg/dL 8.7     Other pertinent labs: - None  New Imaging/Diagnostic Tests: - TEE: Pending - PFTs: Pending   , MD 10/12/2022, 12:42 PM [facility]  FMTS Intern pager: [phone], text pages welcome Secure chat group [facility] Teaching Service

## 2022-10-12 NOTE — Progress Notes (Signed)
PT Cancellation Note  Patient Details Name: Christopher Farrell MRN: [id number] DOB: [birth year]   Cancelled Treatment:    Reason Eval/Treat Not Completed: (P) Patient at procedure or test/unavailable;Patient declined, no reason specified, pt off unit for TEE in AM and declining mobility on return in PM due to fatigue. Will check back as schedule allows to continue with PT POC.  Lenora Boys. PTA Acute Rehabilitation Services Office: [phone]    Catalina Antigua 10/12/2022, 2:33 PM

## 2022-10-13 ENCOUNTER — Encounter (HOSPITAL_COMMUNITY): Payer: Self-pay | Admitting: Cardiology

## 2022-10-13 DIAGNOSIS — B955 Unspecified streptococcus as the cause of diseases classified elsewhere: Secondary | ICD-10-CM

## 2022-10-13 DIAGNOSIS — R7881 Bacteremia: Secondary | ICD-10-CM

## 2022-10-13 NOTE — Progress Notes (Signed)
Daily Progress Note Intern Pager: [phone]  Patient name: Christopher Farrell Medical record number: [id number] Date of birth: 1960/09/14 Age: 62 y.o. Gender: male  Primary Care Provider: Pcp, No Consultants: Cardiology, CT Surgery, ID, Oral Surgery  Code Status: FULL  Pt Overview and Major Events to Date:  7/1: Admission and Intubation 7/2: Brain MRI - multifocal acute ischemia in R>L cerebral hemispheres and cerebellum  7/4: Extubated 7/5: Transferred to FMTS  7/8: TEE - MV multilobular vegetation  7/9: Prolonged IV Rocephin for 6 weeks total 7/10: Started Lexapro 10 mg 7/16: Brain MRI - similar artifact present need CT Head to r/o hemorrhage  7/17: CT head negative 7/19: repeated low BPs, started midodrine 5mg  7/21: Increased midodrine to 10 mg 7/24: Increase Lexapro to 20 mg 8/4: N.p.o. at midnight for cardiac catheterization on 8/5 8/5: Cardiac cath performed, no evidence of CAD 8/6: Oral surgery consulted 8/7: PFTs, TEE   Assessment and Plan: Christopher Farrell is a 62 y.o. male with a pertinent PMH of IV drug use and HIV who was admitted with meningitis and MV endocarditis in the setting of streptococcus bacteremia, currently finishing 6 weeks of IV antibiotics.  [facility]     * (Principal) Acute bacterial endocarditis     No MV vegetation seen on TEE 8/7, but definite MR.  Remains afebrile.   Poor candidate for outpatient IV abx d/t IVDU. - Continue Rocephin for total of 6 weeks (7/1-8/12) - Cardiology signed off, CT surgery requesting outpatient follow up in 4  weeks for MV surgery - Will arrange dental follow up for teeth extraction outpatient        Human immunodeficiency virus (HIV) disease (HCC)     HIV load 8/1 showed quant of 40 copies of 1.602 (On 7/1 was 102K and  5.009 respectively). - Biktarvy 50-200-25 daily - ID follow-up outpatient         Anxiety and depression     Stable on Lexapro 20 mg, started inpatient 7/10. - Consider adding  Wellbutrin if resistant after 4-6 weeks        Protein-calorie malnutrition, severe (HCC)     Severe malnutrition related to chronic illness (HIV) as evidenced by  severe fat depletion, severe muscle depletion.  - Weekly CBC, BMP, next phos 8/7 - RD following, last seen 7/31 - Regular diet - Continue Ensure 3 times daily - Mighty shakes 3 times daily with meals - Mighty cup 3 times daily with meals        Streptococcal bacteremia     S. Pneumo bacteremia on admission with endocarditis and meningitis  sequelae, now resolved.  Patient continues to remain afebrile, no  neuro/cardiac symptoms and physical exam benign except MR murmur. - Plan per primary problem - Weekly BMP (next 8/14)        Hypotension     MAPs >65 past 24 hours, but SBP soft, 80-90.  No hypotensive symptoms  with standing, ambulation. - Midodrine at 5 mg TID, weaning in progress        Anemia, chronic disease     Hgb stable at 9.3. - Monitor with weekly CBC        Need for assessment by dentistry for poor dentition     Orthopantogram with multiple caries and periapical lucency involving  the left upper cuspid that may represent underlying abscess.  On abx  therapy as above.  Will need dental evaluation +/- extraction prior to CV  surgery. - Oral surgery follow up outpatient for ?abscess and future CV surgery        Severe mitral regurgitation     Pulmonary hypertension (HCC)    FEN/GI: Regular diet, protein calorie supplementation as above, no maintenance fluids PPx: No pharmacological Ppx, encourage OOB to chair Q4h when awake Dispo: Possible CT surgery pending TEE 8/7. Barriers to discharge include IV antibiotics until 8/12.  Subjective:  This morning, patient reports that he feels well overall.  No chest pain, shortness of breath, chills.  Has been eating well, walking as able and feels stronger than when he came in.  Objective: Temp:  [97.2 F (36.2 C)-98.3 F (36.8 C)] 98.2 F (36.8 C)  (08/08 0803) Pulse Rate:  [92-100] 95 (08/08 0803) Resp:  [16-18] 18 (08/08 0803) BP: (89-99)/(67-76) 89/69 (08/08 0803) SpO2:  [95 %-98 %] 96 % (08/08 0803)  Physical Exam: General: Age-appropriate, resting comfortably in bed, NAD, alert and at baseline. Cardiovascular: Regular rate and rhythm. Normal S1/S2.  Systolic ejection murmur 3/6 heard best at cardiac apex.  No rubs or gallops appreciated. 2+ radial pulses. Pulmonary: Clear bilaterally to ascultation. No increased WOB, no accessory muscle usage on room air. No wheezes, rales, or crackles. Abdominal: Normoactive bowel sounds, nondistended. No tenderness to deep or light palpation. Skin: Warm and dry.  No rashes grossly. Extremities: No peripheral edema bilaterally.  Capillary refill <2 seconds.  Laboratory: Most recent CBC Lab Results  Component Value Date   WBC 6.5 10/12/2022   HGB 9.3 (L) 10/12/2022   HCT 28.6 (L) 10/12/2022   MCV 94.1 10/12/2022   PLT 302 10/12/2022   Most recent BMP    Latest Ref Rng & Units 10/12/2022    5:00 AM  BMP  Glucose 70 - 99 mg/dL 161   BUN 8 - 23 mg/dL 11   Creatinine 0.96 - 1.24 mg/dL 0.45   Sodium 409 - 811 mmol/L 136   Potassium 3.5 - 5.1 mmol/L 4.0   Chloride 98 - 111 mmol/L 103   CO2 22 - 32 mmol/L 21   Calcium 8.9 - 10.3 mg/dL 8.7     Other pertinent labs: - None  New Imaging/Diagnostic Tests: - None   , MD 10/13/2022, 12:56 PM [facility]  FMTS Intern pager: [phone], text pages welcome Secure chat group [facility] Teaching Service

## 2022-10-13 NOTE — Progress Notes (Signed)
Physical Therapy Treatment Patient Details Name: Christopher Farrell MRN: [id number] DOB: 1960-05-07 Today's Date: 10/13/2022   History of Present Illness Pt is a 62 y/o male presenting on 7/1 with cough and AMS. Noted presented to ED on 6/26 and found with multifocal pneumonia. Returned to ED after being found unresponsive, admitted with sepsis. 2 seizures in ED. CSF reveals bacterial meningitis. MRI with "multifocal acute/subacute ischemia within the right greater than left hemispheres and left cerebellum. Largest lesions in R parietal and occipital lobes". Intubated 7/1-7/3. PMH: HIV.    PT Comments  Pt greeted resting in bed and agreeable to session. Pt performing gait at supervision level without AD for increased distance this session. Attempting to have pt perform DGI as pt with noted guarded gait, limited environmental scanning and shuffling steps placing pt at risk for falls, however pt becoming agitated with DGI requests and stating he cannot complete them with slippers, unwilling to attempt without slippers. Will continue to follow acutely.    If plan is discharge home, recommend the following: Direct supervision/assist for medications management;Direct supervision/assist for financial management;Assist for transportation;A little help with walking and/or transfers;A little help with bathing/dressing/bathroom   Can travel by private vehicle        Equipment Recommendations  None recommended by PT    Recommendations for Other Services       Precautions / Restrictions Precautions Precautions: Fall Restrictions Weight Bearing Restrictions: No     Mobility  Bed Mobility Overal bed mobility: Modified Independent Bed Mobility: Supine to Sit     Supine to sit: Modified independent (Device/Increase time)     General bed mobility comments: No assist needed.    Transfers Overall transfer level: Modified independent Equipment used: None Transfers: Sit to/from Stand Sit to  Stand: Supervision           General transfer comment: Mod I to rise from bed, declines need for AD.    Ambulation/Gait Ambulation/Gait assistance: Supervision Gait Distance (Feet): 1000 Feet Assistive device: None Gait Pattern/deviations: Step-through pattern, Wide base of support, Shuffle Gait velocity: decreased     General Gait Details: pt is mildly frustrated with being asked to walk and tends to shuffle in slippers, pt becoming aggitated with DGI questions unable to progress past change in gait speed, attempting "step around objects" with pt stating he cant due to slippers but unwilling to attempt without slippers   Stairs         General stair comments: pt declining attempts this session despite encouragement   Wheelchair Mobility     Tilt Bed    Modified Rankin (Stroke Patients Only) Modified Rankin (Stroke Patients Only) Pre-Morbid Rankin Score: No symptoms Modified Rankin: Moderate disability     Balance Overall balance assessment: Needs assistance Sitting-balance support: Feet supported Sitting balance-Leahy Scale: Good Sitting balance - Comments: able to perform LB dressing seated on EOB   Standing balance support: No upper extremity supported Standing balance-Leahy Scale: Good Standing balance comment: one extremity support while standing at sink and mobility                            Cognition Arousal: Alert Behavior During Therapy: Flat affect, Agitated Overall Cognitive Status: Impaired/Different from baseline Area of Impairment: Problem solving, Safety/judgement, Attention, Awareness                 Orientation Level: Disoriented to, Time, Situation Current Attention Level: Selective Memory: Decreased recall of precautions, Decreased  short-term memory Following Commands: Follows one step commands consistently Safety/Judgement: Decreased awareness of safety, Decreased awareness of deficits Awareness: Emergent Problem  Solving: Slow processing General Comments: pt flat throughout session, resistive to cues, conversation and questions. attempted DGI with pt becoming aggitated with requests stating "i cant do that with slippers" poor insight into deficits and current state        Exercises      General Comments        Pertinent Vitals/Pain Pain Assessment Pain Assessment: No/denies pain    Home Living                          Prior Function            PT Goals (current goals can now be found in the care plan section) Acute Rehab PT Goals PT Goal Formulation: With patient/family Time For Goal Achievement: 09/28/22 Progress towards PT goals: Progressing toward goals    Frequency    Min 1X/week      PT Plan Frequency needs to be updated    Co-evaluation              AM-PAC PT "6 Clicks" Mobility   Outcome Measure  Help needed turning from your back to your side while in a flat bed without using bedrails?: None Help needed moving from lying on your back to sitting on the side of a flat bed without using bedrails?: None Help needed moving to and from a bed to a chair (including a wheelchair)?: None Help needed standing up from a chair using your arms (e.g., wheelchair or bedside chair)?: None Help needed to walk in hospital room?: A Little Help needed climbing 3-5 steps with a railing? : A Little 6 Click Score: 22    End of Session Equipment Utilized During Treatment: Gait belt Activity Tolerance: Patient tolerated treatment well Patient left: with call bell/phone within reach;in bed Nurse Communication: Mobility status PT Visit Diagnosis: Unsteadiness on feet (R26.81);Other abnormalities of gait and mobility (R26.89);Muscle weakness (generalized) (M62.81);Difficulty in walking, not elsewhere classified (R26.2)     Time: 8295-6213 PT Time Calculation (min) (ACUTE ONLY): 10 min  Charges:    $Gait Training: 8-22 mins PT General Charges $$ ACUTE PT VISIT: 1  Visit                      R. PTA Acute Rehabilitation Services Office: [phone]   Catalina Antigua 10/13/2022, 4:09 PM

## 2022-10-13 NOTE — Assessment & Plan Note (Addendum)
HIV load 8/1 showed quant of 40 copies of 1.602 (On 7/1 was 102K and 5.009 respectively). - Continue Biktarvy 50-200-25 daily -  follow-up with ID outpatient

## 2022-10-13 NOTE — Progress Notes (Signed)
     [address]       [place] [address]             [phone]       TEE results noted Still trying to arrange dental extraction due to abscess, may need to be arranged after DC For rehab to optimize nutrition and mobilization prior to surgery Please arrange follow up 4 weeks after dc with CTS

## 2022-10-13 NOTE — Plan of Care (Signed)
Problem: Education: Goal: Ability to describe self-care measures that may prevent or decrease complications (Diabetes Survival Skills Education) will improve Outcome: Progressing Goal: Individualized Educational Video(s) Outcome: Progressing   Problem: Coping: Goal: Ability to adjust to condition or change in health will improve Outcome: Progressing   Problem: Fluid Volume: Goal: Ability to maintain a balanced intake and output will improve Outcome: Progressing   Problem: Health Behavior/Discharge Planning: Goal: Ability to identify and utilize available resources and services will improve Outcome: Progressing Goal: Ability to manage health-related needs will improve Outcome: Progressing   Problem: Metabolic: Goal: Ability to maintain appropriate glucose levels will improve Outcome: Progressing   Problem: Nutritional: Goal: Maintenance of adequate nutrition will improve Outcome: Progressing Goal: Progress toward achieving an optimal weight will improve Outcome: Progressing   Problem: Skin Integrity: Goal: Risk for impaired skin integrity will decrease Outcome: Progressing   Problem: Tissue Perfusion: Goal: Adequacy of tissue perfusion will improve Outcome: Progressing   Problem: Education: Goal: Knowledge of General Education information will improve Description: Including pain rating scale, medication(s)/side effects and non-pharmacologic comfort measures Outcome: Progressing   Problem: Health Behavior/Discharge Planning: Goal: Ability to manage health-related needs will improve Outcome: Progressing   Problem: Clinical Measurements: Goal: Ability to maintain clinical measurements within normal limits will improve Outcome: Progressing Goal: Will remain free from infection Outcome: Progressing Goal: Diagnostic test results will improve Outcome: Progressing Goal: Respiratory complications will improve Outcome: Progressing Goal: Cardiovascular complication will  be avoided Outcome: Progressing   Problem: Activity: Goal: Risk for activity intolerance will decrease Outcome: Progressing   Problem: Nutrition: Goal: Adequate nutrition will be maintained Outcome: Progressing   Problem: Coping: Goal: Level of anxiety will decrease Outcome: Progressing   Problem: Elimination: Goal: Will not experience complications related to bowel motility Outcome: Progressing Goal: Will not experience complications related to urinary retention Outcome: Progressing   Problem: Safety: Goal: Ability to remain free from injury will improve Outcome: Progressing   Problem: Skin Integrity: Goal: Risk for impaired skin integrity will decrease Outcome: Progressing   Problem: Education: Goal: Knowledge of disease or condition will improve Outcome: Progressing Goal: Knowledge of secondary prevention will improve (MUST DOCUMENT ALL) Outcome: Progressing Goal: Knowledge of patient specific risk factors will improve Loraine Leriche N/A or DELETE if not current risk factor) Outcome: Progressing   Problem: Ischemic Stroke/TIA Tissue Perfusion: Goal: Complications of ischemic stroke/TIA will be minimized Outcome: Progressing   Problem: Coping: Goal: Will verbalize positive feelings about self Outcome: Progressing Goal: Will identify appropriate support needs Outcome: Progressing   Problem: Health Behavior/Discharge Planning: Goal: Ability to manage health-related needs will improve Outcome: Progressing Goal: Goals will be collaboratively established with patient/family Outcome: Progressing   Problem: Self-Care: Goal: Ability to participate in self-care as condition permits will improve Outcome: Progressing Goal: Verbalization of feelings and concerns over difficulty with self-care will improve Outcome: Progressing Goal: Ability to communicate needs accurately will improve Outcome: Progressing   Problem: Nutrition: Goal: Risk of aspiration will decrease Outcome:  Progressing Goal: Dietary intake will improve Outcome: Progressing   Problem: Education: Goal: Understanding of CV disease, CV risk reduction, and recovery process will improve Outcome: Progressing Goal: Individualized Educational Video(s) Outcome: Progressing   Problem: Activity: Goal: Ability to return to baseline activity level will improve Outcome: Progressing   Problem: Cardiovascular: Goal: Ability to achieve and maintain adequate cardiovascular perfusion will improve Outcome: Progressing Goal: Vascular access site(s) Level 0-1 will be maintained Outcome: Progressing   Problem: Health Behavior/Discharge Planning: Goal: Ability to safely manage health-related needs after  discharge will improve Outcome: Progressing

## 2022-10-13 NOTE — Progress Notes (Signed)
Brief cardiology progress note: TEE completed yesterday, consistent with severe MR. No further workup from cardiology perspective, defer to CT surgery on timing/plans for surgery. From cardiology standpoint, he is optimized; unclear if surgery will be planned this admission or after discharge. He is clear for either timeline from our perspective. Cardiology will sign off.  Jodelle Red, MD, PhD, [facility] [place]  [facility]  [place]  [facility] at [facility] at [facility] [address] [place], [place] [address] [phone]

## 2022-10-14 ENCOUNTER — Other Ambulatory Visit (HOSPITAL_COMMUNITY): Payer: Self-pay

## 2022-10-14 NOTE — Progress Notes (Signed)
Daily Progress Note Intern Pager: [phone]  Patient name: Christopher Farrell Medical record number: [id number] Date of birth: 16-Feb-1961 Age: 62 y.o. Gender: male  Primary Care Provider: Pcp, No Consultants: Cardiology, CT Surgery, ID, Oral Surgery Code Status: FULL  Pt Overview and Major Events to Date:  7/1: Admission and Intubation 7/2: Brain MRI - multifocal acute ischemia in R>L cerebral hemispheres and cerebellum  7/4: Extubated 7/5: Transferred to FMTS  7/8: TEE - MV multilobular vegetation  7/9: Prolonged IV Rocephin for 6 weeks total 7/10: Started Lexapro 10 mg 7/16: Brain MRI - similar artifact present need CT Head to r/o hemorrhage  7/17: CT head negative 7/19: repeated low BPs, started midodrine 5mg  7/21: Increased midodrine to 10 mg 7/24: Increase Lexapro to 20 mg 8/4: N.p.o. at midnight for cardiac catheterization on 8/5 8/5: Cardiac cath performed, no evidence of CAD 8/6: Oral surgery consulted 8/7: PFTs, TEE 8/8: CT surgery deferred MV surgery to outpatient, cardiology signed off  Assessment and Plan: Christopher Farrell is a 62 y.o. male with a pertinent PMH of IV drug use and HIV who was admitted with meningitis and MV endocarditis in the setting of streptococcus bacteremia, currently finishing 6 weeks of IV antibiotics.  [facility]     * (Principal) Acute bacterial endocarditis     No MV vegetation seen on TEE 8/7, but definite MR.  Remains afebrile.   Poor candidate for outpatient IV abx d/t IVDU. - Continue Rocephin for total of 6 weeks (7/1-8/12) - Cardiology signed off, CT surgery requesting outpatient follow up in 4  weeks for MV surgery - Will arrange dental follow up for teeth extraction at outpatient PCP f/u        Human immunodeficiency virus (HIV) disease (HCC)     HIV load 8/1 showed quant of 40 copies of 1.602 (On 7/1 was 102K and  5.009 respectively). - Biktarvy 50-200-25 daily - ID follow-up outpatient         Anxiety and  depression     Stable on Lexapro 20 mg, started inpatient 7/10. - Consider adding Wellbutrin if resistant after 4-6 weeks        Protein-calorie malnutrition, severe (HCC)     Severe malnutrition related to chronic illness (HIV) as evidenced by  severe fat depletion, severe muscle depletion.  - Weekly CBC, BMP, next phos 8/7 - RD following, last seen 7/31 - Regular diet - Continue Ensure 3 times daily - Mighty shakes 3 times daily with meals - Mighty cup 3 times daily with meals        Hypotension     MAPs >65 past 24 hours, but SBP soft, 80-90.  No hypotensive symptoms  with standing, ambulation. - Midodrine at 5 mg TID, weaning in progress        Anemia, chronic disease     Hgb stable at 9.3. - Monitor with weekly CBC        Need for assessment by dentistry for poor dentition     Orthopantogram with multiple caries and periapical lucency involving  the left upper cuspid that may represent underlying abscess.  On abx  therapy as above.  Will need dental evaluation +/- extraction prior to CV  surgery - Oral surgery follow up outpatient after PCP appointment for ?abscess and  future CV surgery        Severe mitral regurgitation     Pulmonary hypertension (HCC)   FEN/GI: Regular diet, protein calorie supplementation as  above, no maintenance fluids PPx: No pharmacological Ppx, encourage OOB to chair Q4h when awake Dispo: Home with CT, Cardiology, and ID f/u.  Barriers to discharge include IV antibiotics until 8/12.  Subjective:  This morning, patient states that he is feeling well and is hoping to "get out of here soon."Does expresses disappointment that his mitral valve surgery will not be proceeding this hospitalization, but affirms his understanding that he needs to follow-up outpatient.  Appears hesitant about outpatient follow-up appointments, states he just wants to get back home to take care of his job and other life tasks.  Reiterated importance of good outpatient  follow up.  Objective: Temp:  [98.1 F (36.7 C)-98.8 F (37.1 C)] 98.1 F (36.7 C) (08/09 0817) Pulse Rate:  [89-97] 89 (08/09 0817) Resp:  [17-18] 17 (08/08 2132) BP: (90-103)/(69-78) 90/69 (08/09 0817) SpO2:  [97 %-100 %] 100 % (08/09 0817) Weight:  [59.4 kg] 59.4 kg (08/09 0706)  Physical Exam: General: Thin male, sitting at edge of bed and eating breakfast. Cardiovascular: Regular rate and rhythm. Normal S1/S2.  3/6 systolic ejection murmur. No rubs or gallops appreciated. 2+ radial pulses. Pulmonary: Clear bilaterally to ascultation. No increased WOB, no accessory muscle usage on room air. No wheezes, rales, or crackles. Skin: Warm and dry. No rashes grossly. Extremities: No peripheral edema bilaterally.   Laboratory: Most recent CBC Lab Results  Component Value Date   WBC 6.5 10/12/2022   HGB 9.3 (L) 10/12/2022   HCT 28.6 (L) 10/12/2022   MCV 94.1 10/12/2022   PLT 302 10/12/2022   Most recent BMP    Latest Ref Rng & Units 10/12/2022    5:00 AM  BMP  Glucose 70 - 99 mg/dL 130   BUN 8 - 23 mg/dL 11   Creatinine 8.65 - 1.24 mg/dL 7.84   Sodium 696 - 295 mmol/L 136   Potassium 3.5 - 5.1 mmol/L 4.0   Chloride 98 - 111 mmol/L 103   CO2 22 - 32 mmol/L 21   Calcium 8.9 - 10.3 mg/dL 8.7     Other pertinent labs: - None  New Imaging/Diagnostic Tests: - None   , MD 10/14/2022, 1:15 PM [facility]  FMTS Intern pager: [phone], text pages welcome Secure chat group [facility] Teaching Service

## 2022-10-14 NOTE — Plan of Care (Signed)
 Problem: Education: Goal: Ability to describe self-care measures that may prevent or decrease complications (Diabetes Survival Skills Education) will improve Outcome: Progressing Goal: Individualized Educational Video(s) Outcome: Progressing   Problem: Coping: Goal: Ability to adjust to condition or change in health will improve Outcome: Progressing   Problem: Fluid Volume: Goal: Ability to maintain a balanced intake and output will improve Outcome: Progressing   Problem: Health Behavior/Discharge Planning: Goal: Ability to identify and utilize available resources and services will improve Outcome: Progressing Goal: Ability to manage health-related needs will improve Outcome: Progressing   Problem: Metabolic: Goal: Ability to maintain appropriate glucose levels will improve Outcome: Progressing   Problem: Nutritional: Goal: Maintenance of adequate nutrition will improve Outcome: Progressing Goal: Progress toward achieving an optimal weight will improve Outcome: Progressing   Problem: Skin Integrity: Goal: Risk for impaired skin integrity will decrease Outcome: Progressing   Problem: Tissue Perfusion: Goal: Adequacy of tissue perfusion will improve Outcome: Progressing   Problem: Education: Goal: Knowledge of General Education information will improve Description: Including pain rating scale, medication(s)/side effects and non-pharmacologic comfort measures Outcome: Progressing   Problem: Health Behavior/Discharge Planning: Goal: Ability to manage health-related needs will improve Outcome: Progressing   Problem: Clinical Measurements: Goal: Ability to maintain clinical measurements within normal limits will improve Outcome: Progressing Goal: Will remain free from infection Outcome: Progressing Goal: Diagnostic test results will improve Outcome: Progressing Goal: Respiratory complications will improve Outcome: Progressing Goal: Cardiovascular complication will  be avoided Outcome: Progressing   Problem: Activity: Goal: Risk for activity intolerance will decrease Outcome: Progressing   Problem: Nutrition: Goal: Adequate nutrition will be maintained Outcome: Progressing   Problem: Coping: Goal: Level of anxiety will decrease Outcome: Progressing   Problem: Elimination: Goal: Will not experience complications related to bowel motility Outcome: Progressing Goal: Will not experience complications related to urinary retention Outcome: Progressing   Problem: Safety: Goal: Ability to remain free from injury will improve Outcome: Progressing   Problem: Skin Integrity: Goal: Risk for impaired skin integrity will decrease Outcome: Progressing   Problem: Education: Goal: Knowledge of disease or condition will improve Outcome: Progressing Goal: Knowledge of secondary prevention will improve (MUST DOCUMENT ALL) Outcome: Progressing Goal: Knowledge of patient specific risk factors will improve Loraine Leriche N/A or DELETE if not current risk factor) Outcome: Progressing   Problem: Ischemic Stroke/TIA Tissue Perfusion: Goal: Complications of ischemic stroke/TIA will be minimized Outcome: Progressing   Problem: Coping: Goal: Will verbalize positive feelings about self Outcome: Progressing Goal: Will identify appropriate support needs Outcome: Progressing   Problem: Health Behavior/Discharge Planning: Goal: Ability to manage health-related needs will improve Outcome: Progressing Goal: Goals will be collaboratively established with patient/family Outcome: Progressing   Problem: Self-Care: Goal: Ability to participate in self-care as condition permits will improve Outcome: Progressing Goal: Verbalization of feelings and concerns over difficulty with self-care will improve Outcome: Progressing Goal: Ability to communicate needs accurately will improve Outcome: Progressing   Problem: Nutrition: Goal: Risk of aspiration will decrease Outcome:  Progressing Goal: Dietary intake will improve Outcome: Progressing   Problem: Education: Goal: Understanding of CV disease, CV risk reduction, and recovery process will improve Outcome: Progressing Goal: Individualized Educational Video(s) Outcome: Progressing   Problem: Activity: Goal: Ability to return to baseline activity level will improve Outcome: Progressing   Problem: Cardiovascular: Goal: Ability to achieve and maintain adequate cardiovascular perfusion will improve Outcome: Progressing Goal: Vascular access site(s) Level 0-1 will be maintained Outcome: Progressing   Problem: Health Behavior/Discharge Planning: Goal: Ability to safely manage health-related needs after  discharge will improve Outcome: Progressing

## 2022-10-14 NOTE — Assessment & Plan Note (Signed)
Stable on Lexapro 20 mg - Consider adding Wellbutrin if resistant after 4-6 weeks

## 2022-10-14 NOTE — Progress Notes (Signed)
Occupational Therapy Treatment Patient Details Name: Christopher Farrell MRN: [id number] DOB: [birth year] Today's Date: 10/14/2022   History of present illness Pt is a 62 y/o male presenting on 7/1 with cough and AMS. Noted presented to ED on 6/26 and found with multifocal pneumonia. Returned to ED after being found unresponsive, admitted with sepsis. 2 seizures in ED. CSF reveals bacterial meningitis. MRI with "multifocal acute/subacute ischemia within the right greater than left hemispheres and left cerebellum. Largest lesions in R parietal and occipital lobes". Intubated 7/1-7/3. PMH: HIV.   OT comments  Pt meeting established goals. Pt reporting he is feeling at or very close to baseline when asked in relation to cognition, vision, and physical ability. Pt with some decreased problem solving as indicated by difficulty with path finding back to room after long distance mobility in halls of hospital. Does demonstrate carryover of compensatory techniques such as usage of signage in halls with one indirect cue. Have attempted to challenge pt with ambulation outside or additional activities such as word searches and other pen/paper games/activities, but pt resistant, so may benefit from other approaches as well such as dynamic multisensory tasks including recreation or IADL. Will continue to follow to optimize pt successful transition home. Per family request continue to recommend HHOT for home safety eval and transition back into natural environment.       If plan is discharge home, recommend the following:  Assistance with cooking/housework;Direct supervision/assist for medications management;Direct supervision/assist for financial management;Assist for transportation   Equipment Recommendations  None recommended by OT    Recommendations for Other Services      Precautions / Restrictions Precautions Precautions: Fall Restrictions Weight Bearing Restrictions: No       Mobility Bed  Mobility Overal bed mobility: Modified Independent                  Transfers Overall transfer level: Modified independent                       Balance Overall balance assessment: Needs assistance         Standing balance support: No upper extremity supported Standing balance-Leahy Scale: Good Standing balance comment: no UE support needed, mild balance deficits observed but no LOB                           ADL either performed or assessed with clinical judgement   ADL Overall ADL's : Needs assistance/impaired     Grooming: Modified independent;Wash/dry hands Grooming Details (indicate cue type and reason): at sink             Lower Body Dressing: Modified independent;Sitting/lateral leans Lower Body Dressing Details (indicate cue type and reason): don socks and slippers             Functional mobility during ADLs: Modified independent General ADL Comments: continued challenge of attention, problem, solving, awareness, and vision with functional mobility into hall.    Extremity/Trunk Assessment Upper Extremity Assessment Upper Extremity Assessment: Overall WFL for tasks assessed   Lower Extremity Assessment Lower Extremity Assessment: Defer to PT evaluation        Vision   Additional Comments: Pt deferring further visual assessment, stating that his vision is "normal". able to navigate around variousl obstacles in hall.   Perception     Praxis      Cognition Arousal: Alert Behavior During Therapy: Flat affect, Agitated Overall Cognitive Status: Impaired/Different from baseline Area  of Impairment: Problem solving, Safety/judgement, Attention, Awareness                   Current Attention Level: Selective Memory: Decreased recall of precautions, Decreased short-term memory Following Commands: Follows one step commands consistently Safety/Judgement: Decreased awareness of safety, Decreased awareness of  deficits Awareness: Emergent Problem Solving: Slow processing General Comments: pt flat throughout session, resistive to cues, conversation and questions. initiated conversation with pt regarding cognition, vision, and physical function. Pt reporting he feels he is at his own functional baseline. Pt continues to be observed to have difficulty path finding back to room during sessions after taking new routes in halls, but was observed with some carry over to use hall signage today and only needed one indirect cue to idnetify he was going the wrong direction "where are we going again"        Exercises Exercises: Other exercises Other Exercises Other Exercises: Pt refusing to engage in any exercise including therapy ball, resistance band, or AROM    Shoulder Instructions       General Comments      Pertinent Vitals/ Pain       Pain Assessment Pain Assessment: No/denies pain  Home Living                                          Prior Functioning/Environment              Frequency  Min 1X/week        Progress Toward Goals  OT Goals(current goals can now be found in the care plan section)  Progress towards OT goals: Goals updated  Acute Rehab OT Goals Patient Stated Goal: get out of hospital OT Goal Formulation: With patient Time For Goal Achievement: 10/28/22 Potential to Achieve Goals: Good  Plan Discharge plan remains appropriate    Co-evaluation                 AM-PAC OT "6 Clicks" Daily Activity     Outcome Measure   Help from another person eating meals?: None Help from another person taking care of personal grooming?: None Help from another person toileting, which includes using toliet, bedpan, or urinal?: A Little Help from another person bathing (including washing, rinsing, drying)?: None Help from another person to put on and taking off regular upper body clothing?: None Help from another person to put on and taking off regular  lower body clothing?: None 6 Click Score: 23    End of Session    OT Visit Diagnosis: Other abnormalities of gait and mobility (R26.89);Muscle weakness (generalized) (M62.81);Pain;Other symptoms and signs involving cognitive function;Cognitive communication deficit (R41.841);Other symptoms and signs involving the nervous system (R29.898)   Activity Tolerance Patient tolerated treatment well   Patient Left with call bell/phone within reach;in bed   Nurse Communication Mobility status        Time: 7829-5621 OT Time Calculation (min): 18 min  Charges: OT General Charges $OT Visit: 1 Visit OT Treatments $Therapeutic Activity: 8-22 mins  Tyler Deis, OTR/L [facility] Acute Rehabilitation Office: [phone]   Myrla Halsted 10/14/2022, 6:07 PM

## 2022-10-14 NOTE — Progress Notes (Signed)
Daily Progress Note Intern Pager: [phone]  Patient name: Christopher Farrell Medical record number: [id number] Date of birth: 1961/02/17 Age: 62 y.o. Gender: male  Primary Care Provider: Pcp, No Consultants: Cardiology, CT surgery, ID, oral surgery Code Status: Full  Pt Overview and Major Events to Date:  7/1: Admission and Intubation 7/2: Brain MRI - multifocal acute ischemia in R>L cerebral hemispheres and cerebellum  7/4: Extubated 7/5: Transferred to FMTS  7/8: TEE - MV multilobular vegetation  7/9: Prolonged IV Rocephin for 6 weeks total 7/10: Started Lexapro 10 mg 7/16: Brain MRI - similar artifact present need CT Head to r/o hemorrhage  7/17: CT head negative 7/19: repeated low BPs, started midodrine 5mg  7/21: Increased midodrine to 10 mg 7/24: Increase Lexapro to 20 mg 8/4: N.p.o. at midnight for cardiac catheterization on 8/5 8/5: Cardiac cath performed, no evidence of CAD 8/6: Oral surgery consulted 8/7: PFTs, TEE 8/8: CT surgery deferred MV surgery to outpatient, cardiology signed off  Assessment and Plan: Christopher Farrell is a 62 year old male with PMH of IV drug use and HIV admitted for meningitis and MV endocarditis in the setting of streptococcal bacteremia currently finishing 6 weeks of IV antibiotics. [facility]     * (Principal) Acute bacterial endocarditis     No MV vegetation seen on TEE 8/7. Remains afebrile a poor candidate for  outpatient IV antibiotics, will continue IV Rocephin on 08/12. - Continue Rocephin for total of 6 weeks (7/1-8/12) - Cardiology signed off, CT surgery requesting outpatient follow up in 4  weeks for MV surgery - Will arrange dental follow up for teeth extraction at outpatient PCP f/u        Human immunodeficiency virus (HIV) disease (HCC)     HIV load 8/1 showed quant of 40 copies of 1.602 (On 7/1 was 102K and  5.009 respectively). - Continue Biktarvy 50-200-25 daily -  follow-up with ID outpatient          Anxiety and depression     Stable on Lexapro 20 mg - Consider adding Wellbutrin if resistant after 4-6 weeks        Protein-calorie malnutrition, severe (HCC)     Severe malnutrition related to chronic illness (HIV) as evidenced by  severe fat depletion, severe muscle depletion.  - Weekly CBC, BMP, next phos 8/7 - RD following, last seen 7/31 - Regular diet - Continue Ensure 3 times daily - Mighty shakes 3 times daily with meals - Mighty cup 3 times daily with meals        Hypotension     Soft BP is with appropriate MAP.  Overall asymptomatic. - Midodrine at 5 mg TID, weaning in progress        Anemia, chronic disease     Stable, asymptomatic. - Monitor with weekly CBC        Need for assessment by dentistry for poor dentition     Orthopantogram with multiple caries and periapical lucency involving  the left upper cuspid that may represent underlying abscess.  On abx  therapy as above.  Will need dental evaluation +/- extraction prior to CV  surgery - Oral surgery follow up outpatient after PCP appointment for ?abscess and  future CV surgery       FEN/GI: Regular diet, protein calorie supplementation PPx: OOB, SCDs Dispo:Home  pending completion of IV antibiotics .   Subjective:  Patient said he is feeling well and has no complaints at this time.  Objective: Temp:  [98 F (36.7 C)-98.8 F (37.1 C)] 98 F (36.7 C) (08/09 1942) Pulse Rate:  [89-96] 96 (08/09 1942) Resp:  [16] 16 (08/09 1942) BP: (90-97)/(69-75) 97/75 (08/09 1942) SpO2:  [96 %-100 %] 96 % (08/09 1942) Weight:  [59.4 kg] 59.4 kg (08/09 0706) Physical Exam: General: Alert, well appearing, NAD CV: RRR, systolic murmurs Pulm: CTAB, good WOB on RA, no crackles or wheezing Abd: Soft, no distension, no tenderness Psych: Pleasant, appropriate mood Ext: No BLE edema   Laboratory: Most recent CBC Lab Results  Component Value Date   WBC 6.5 10/12/2022   HGB 9.3 (L) 10/12/2022   HCT 28.6 (L)  10/12/2022   MCV 94.1 10/12/2022   PLT 302 10/12/2022   Most recent BMP    Latest Ref Rng & Units 10/12/2022    5:00 AM  BMP  Glucose 70 - 99 mg/dL 811   BUN 8 - 23 mg/dL 11   Creatinine 9.14 - 1.24 mg/dL 7.82   Sodium 956 - 213 mmol/L 136   Potassium 3.5 - 5.1 mmol/L 4.0   Chloride 98 - 111 mmol/L 103   CO2 22 - 32 mmol/L 21   Calcium 8.9 - 10.3 mg/dL 8.7     Imaging/Diagnostic Tests: No new images   Jerre Simon, MD 10/14/2022, 10:44 PM  PGY-3, [facility] FPTS Intern pager: [phone], text pages welcome Secure chat group [facility] Teaching Service

## 2022-10-15 NOTE — Assessment & Plan Note (Signed)
HIV load 8/1 showed quant of 40 copies of 1.602 (On 7/1 was 102K and 5.009 respectively). - Continue Biktarvy 50-200-25 daily -  follow-up with ID outpatient

## 2022-10-15 NOTE — Assessment & Plan Note (Signed)
No MV vegetation seen on TEE 8/7. Remains afebrile a poor candidate for outpatient IV antibiotics, will continue IV Rocephin until 08/12. - Continue Rocephin for total of 6 weeks (7/1-8/12) - Cardiology signed off, CT surgery requesting outpatient follow up in 4 weeks for MV surgery - Will arrange dental follow up for teeth extraction at outpatient PCP f/u

## 2022-10-15 NOTE — Assessment & Plan Note (Signed)
Orthopantogram with multiple caries and periapical lucency involving the left upper cuspid that may represent underlying abscess.  On abx therapy as noted.  Will need dental evaluation +/- extraction prior to CV surgery - Oral surgery follow up outpatient after PCP appointment for ?abscess and future CV surgery

## 2022-10-15 NOTE — Assessment & Plan Note (Signed)
Severe malnutrition related to chronic illness (HIV) as evidenced by severe fat depletion, severe muscle depletion.  - Weekly CBC, BMP, phos - Appreciate RD - Regular diet - Continue Ensure 3 times daily - Mighty shakes 3 times daily with meals - Mighty cup 3 times daily with meals

## 2022-10-15 NOTE — Plan of Care (Signed)
 Problem: Education: Goal: Ability to describe self-care measures that may prevent or decrease complications (Diabetes Survival Skills Education) will improve Outcome: Progressing Goal: Individualized Educational Video(s) Outcome: Progressing   Problem: Coping: Goal: Ability to adjust to condition or change in health will improve Outcome: Progressing   Problem: Fluid Volume: Goal: Ability to maintain a balanced intake and output will improve Outcome: Progressing   Problem: Health Behavior/Discharge Planning: Goal: Ability to identify and utilize available resources and services will improve Outcome: Progressing Goal: Ability to manage health-related needs will improve Outcome: Progressing   Problem: Metabolic: Goal: Ability to maintain appropriate glucose levels will improve Outcome: Progressing   Problem: Nutritional: Goal: Maintenance of adequate nutrition will improve Outcome: Progressing Goal: Progress toward achieving an optimal weight will improve Outcome: Progressing   Problem: Skin Integrity: Goal: Risk for impaired skin integrity will decrease Outcome: Progressing   Problem: Tissue Perfusion: Goal: Adequacy of tissue perfusion will improve Outcome: Progressing   Problem: Education: Goal: Knowledge of General Education information will improve Description: Including pain rating scale, medication(s)/side effects and non-pharmacologic comfort measures Outcome: Progressing   Problem: Health Behavior/Discharge Planning: Goal: Ability to manage health-related needs will improve Outcome: Progressing   Problem: Clinical Measurements: Goal: Ability to maintain clinical measurements within normal limits will improve Outcome: Progressing Goal: Will remain free from infection Outcome: Progressing Goal: Diagnostic test results will improve Outcome: Progressing Goal: Respiratory complications will improve Outcome: Progressing Goal: Cardiovascular complication will  be avoided Outcome: Progressing   Problem: Activity: Goal: Risk for activity intolerance will decrease Outcome: Progressing   Problem: Nutrition: Goal: Adequate nutrition will be maintained Outcome: Progressing   Problem: Coping: Goal: Level of anxiety will decrease Outcome: Progressing   Problem: Elimination: Goal: Will not experience complications related to bowel motility Outcome: Progressing Goal: Will not experience complications related to urinary retention Outcome: Progressing   Problem: Safety: Goal: Ability to remain free from injury will improve Outcome: Progressing   Problem: Skin Integrity: Goal: Risk for impaired skin integrity will decrease Outcome: Progressing   Problem: Education: Goal: Knowledge of disease or condition will improve Outcome: Progressing Goal: Knowledge of secondary prevention will improve (MUST DOCUMENT ALL) Outcome: Progressing Goal: Knowledge of patient specific risk factors will improve Loraine Leriche N/A or DELETE if not current risk factor) Outcome: Progressing   Problem: Ischemic Stroke/TIA Tissue Perfusion: Goal: Complications of ischemic stroke/TIA will be minimized Outcome: Progressing   Problem: Coping: Goal: Will verbalize positive feelings about self Outcome: Progressing Goal: Will identify appropriate support needs Outcome: Progressing   Problem: Health Behavior/Discharge Planning: Goal: Ability to manage health-related needs will improve Outcome: Progressing Goal: Goals will be collaboratively established with patient/family Outcome: Progressing   Problem: Self-Care: Goal: Ability to participate in self-care as condition permits will improve Outcome: Progressing Goal: Verbalization of feelings and concerns over difficulty with self-care will improve Outcome: Progressing Goal: Ability to communicate needs accurately will improve Outcome: Progressing   Problem: Nutrition: Goal: Risk of aspiration will decrease Outcome:  Progressing Goal: Dietary intake will improve Outcome: Progressing   Problem: Education: Goal: Understanding of CV disease, CV risk reduction, and recovery process will improve Outcome: Progressing Goal: Individualized Educational Video(s) Outcome: Progressing   Problem: Activity: Goal: Ability to return to baseline activity level will improve Outcome: Progressing   Problem: Cardiovascular: Goal: Ability to achieve and maintain adequate cardiovascular perfusion will improve Outcome: Progressing Goal: Vascular access site(s) Level 0-1 will be maintained Outcome: Progressing   Problem: Health Behavior/Discharge Planning: Goal: Ability to safely manage health-related needs after  discharge will improve Outcome: Progressing

## 2022-10-15 NOTE — Assessment & Plan Note (Signed)
Stable on Lexapro 20 mg - Consider adding Wellbutrin if resistant after 4-6 weeks

## 2022-10-15 NOTE — Assessment & Plan Note (Signed)
Soft BP is with appropriate MAP.  Overall asymptomatic. - Midodrine at 5 mg TID

## 2022-10-15 NOTE — Assessment & Plan Note (Signed)
Stable, asymptomatic. - Monitor with weekly CBC

## 2022-10-16 DIAGNOSIS — B20 Human immunodeficiency virus [HIV] disease: Secondary | ICD-10-CM

## 2022-10-16 NOTE — Plan of Care (Signed)
 Problem: Education: Goal: Ability to describe self-care measures that may prevent or decrease complications (Diabetes Survival Skills Education) will improve Outcome: Progressing Goal: Individualized Educational Video(s) Outcome: Progressing   Problem: Coping: Goal: Ability to adjust to condition or change in health will improve Outcome: Progressing   Problem: Fluid Volume: Goal: Ability to maintain a balanced intake and output will improve Outcome: Progressing   Problem: Health Behavior/Discharge Planning: Goal: Ability to identify and utilize available resources and services will improve Outcome: Progressing Goal: Ability to manage health-related needs will improve Outcome: Progressing   Problem: Metabolic: Goal: Ability to maintain appropriate glucose levels will improve Outcome: Progressing   Problem: Nutritional: Goal: Maintenance of adequate nutrition will improve Outcome: Progressing Goal: Progress toward achieving an optimal weight will improve Outcome: Progressing   Problem: Skin Integrity: Goal: Risk for impaired skin integrity will decrease Outcome: Progressing   Problem: Tissue Perfusion: Goal: Adequacy of tissue perfusion will improve Outcome: Progressing   Problem: Education: Goal: Knowledge of General Education information will improve Description: Including pain rating scale, medication(s)/side effects and non-pharmacologic comfort measures Outcome: Progressing   Problem: Health Behavior/Discharge Planning: Goal: Ability to manage health-related needs will improve Outcome: Progressing   Problem: Clinical Measurements: Goal: Ability to maintain clinical measurements within normal limits will improve Outcome: Progressing Goal: Will remain free from infection Outcome: Progressing Goal: Diagnostic test results will improve Outcome: Progressing Goal: Respiratory complications will improve Outcome: Progressing Goal: Cardiovascular complication will  be avoided Outcome: Progressing   Problem: Activity: Goal: Risk for activity intolerance will decrease Outcome: Progressing   Problem: Nutrition: Goal: Adequate nutrition will be maintained Outcome: Progressing   Problem: Coping: Goal: Level of anxiety will decrease Outcome: Progressing   Problem: Elimination: Goal: Will not experience complications related to bowel motility Outcome: Progressing Goal: Will not experience complications related to urinary retention Outcome: Progressing   Problem: Safety: Goal: Ability to remain free from injury will improve Outcome: Progressing   Problem: Skin Integrity: Goal: Risk for impaired skin integrity will decrease Outcome: Progressing   Problem: Education: Goal: Knowledge of disease or condition will improve Outcome: Progressing Goal: Knowledge of secondary prevention will improve (MUST DOCUMENT ALL) Outcome: Progressing Goal: Knowledge of patient specific risk factors will improve Christopher Farrell N/A or DELETE if not current risk factor) Outcome: Progressing   Problem: Ischemic Stroke/TIA Tissue Perfusion: Goal: Complications of ischemic stroke/TIA will be minimized Outcome: Progressing   Problem: Coping: Goal: Will verbalize positive feelings about self Outcome: Progressing Goal: Will identify appropriate support needs Outcome: Progressing   Problem: Health Behavior/Discharge Planning: Goal: Ability to manage health-related needs will improve Outcome: Progressing Goal: Goals will be collaboratively established with patient/family Outcome: Progressing   Problem: Self-Care: Goal: Ability to participate in self-care as condition permits will improve Outcome: Progressing Goal: Verbalization of feelings and concerns over difficulty with self-care will improve Outcome: Progressing Goal: Ability to communicate needs accurately will improve Outcome: Progressing   Problem: Nutrition: Goal: Risk of aspiration will decrease Outcome:  Progressing Goal: Dietary intake will improve Outcome: Progressing   Problem: Education: Goal: Understanding of CV disease, CV risk reduction, and recovery process will improve Outcome: Progressing Goal: Individualized Educational Video(s) Outcome: Progressing   Problem: Activity: Goal: Ability to return to baseline activity level will improve Outcome: Progressing   Problem: Cardiovascular: Goal: Ability to achieve and maintain adequate cardiovascular perfusion will improve Outcome: Progressing Goal: Vascular access site(s) Level 0-1 will be maintained Outcome: Progressing   Problem: Health Behavior/Discharge Planning: Goal: Ability to safely manage health-related needs after  discharge will improve Outcome: Progressing

## 2022-10-16 NOTE — Progress Notes (Signed)
Daily Progress Note Intern Pager: [phone]  Patient name: Christopher Farrell Medical record number: [id number] Date of birth: 30-Nov-1960 Age: 62 y.o. Gender: male  Primary Care Provider: Pcp, No Consultants: Cardiology, CT surgery, ID, oral surgery Code Status: Full   Pt Overview and Major Events to Date:  7/1: Admission and Intubation 7/2: Brain MRI - multifocal acute ischemia in R>L cerebral hemispheres and cerebellum  7/4: Extubated 7/5: Transferred to FMTS  7/8: TEE - MV multilobular vegetation  7/9: Prolonged IV Rocephin for 6 weeks total 7/10: Started Lexapro 10 mg 7/16: Brain MRI - similar artifact present need CT Head to r/o hemorrhage  7/17: CT head negative 7/19: repeated low BPs, started midodrine 5mg  7/21: Increased midodrine to 10 mg 7/24: Increase Lexapro to 20 mg 8/4: N.p.o. at midnight for cardiac catheterization on 8/5 8/5: Cardiac cath performed, no evidence of CAD 8/6: Oral surgery consulted 8/7: PFTs, TEE 8/8: CT surgery deferred MV surgery to outpatient, cardiology signed off   Assessment and Plan: Christopher Farrell is a 62 year old male with PMH of IV drug use and HIV admitted for meningitis and MV endocarditis in the setting of streptococcal bacteremia currently finishing 6 weeks of IV antibiotics.  [facility]     * (Principal) Acute bacterial endocarditis     No MV vegetation seen on TEE 8/7. Remains afebrile a poor candidate for  outpatient IV antibiotics, will continue IV Rocephin until 08/12. - Continue Rocephin for total of 6 weeks (7/1-8/12) - Cardiology signed off, CT surgery requesting outpatient follow up in 4  weeks for MV surgery - Will arrange dental follow up for teeth extraction at outpatient PCP f/u        Human immunodeficiency virus (HIV) disease (HCC)     HIV load 8/1 showed quant of 40 copies of 1.602 (On 7/1 was 102K and  5.009 respectively). - Continue Biktarvy 50-200-25 daily -  follow-up with ID outpatient          Anxiety and depression     Stable on Lexapro 20 mg - Consider adding Wellbutrin if resistant after 4-6 weeks        Protein-calorie malnutrition, severe (HCC)     Severe malnutrition related to chronic illness (HIV) as evidenced by  severe fat depletion, severe muscle depletion.  - Weekly CBC, BMP, phos - Appreciate RD - Regular diet - Continue Ensure 3 times daily - Mighty shakes 3 times daily with meals - Mighty cup 3 times daily with meals        Hypotension     Soft BP is with appropriate MAP.  Overall asymptomatic. - Midodrine at 5 mg TID        Anemia, chronic disease     Stable, asymptomatic. - Monitor with weekly CBC        Need for assessment by dentistry for poor dentition     Orthopantogram with multiple caries and periapical lucency involving  the left upper cuspid that may represent underlying abscess.  On abx  therapy as noted.  Will need dental evaluation +/- extraction prior to CV  surgery - Oral surgery follow up outpatient after PCP appointment for ?abscess and  future CV surgery       FEN/GI: Regular diet, protein calorie supplementation PPx: OOB, SCDs Dispo:Home  pending completion of IV antibiotics   Subjective:  NAEON, denies concerns this morning, denies SOB or pain  Objective: Temp:  [97.7 F (36.5 C)-98.1 F (36.7 C)]  98 F (36.7 C) (08/11 0603) Pulse Rate:  [93-96] 96 (08/11 0603) Resp:  [16-18] 16 (08/11 0603) BP: (92-96)/(69-78) 92/74 (08/11 0603) SpO2:  [97 %-98 %] 97 % (08/11 0603) Weight:  [55.5 kg] 55.5 kg (08/11 0603) Physical Exam: General: NAD, comfortable in bed Cardiovascular: RRR, systolic murmur appreciated Respiratory: CTAB normal WOB MRI Abdomen: Soft NT/ND Extremities: No significant edema  Laboratory: Most recent CBC Lab Results  Component Value Date   WBC 6.5 10/12/2022   HGB 9.3 (L) 10/12/2022   HCT 28.6 (L) 10/12/2022   MCV 94.1 10/12/2022   PLT 302 10/12/2022   Most recent BMP    Latest Ref  Rng & Units 10/12/2022    5:00 AM  BMP  Glucose 70 - 99 mg/dL 147   BUN 8 - 23 mg/dL 11   Creatinine 8.29 - 1.24 mg/dL 5.62   Sodium 130 - 865 mmol/L 136   Potassium 3.5 - 5.1 mmol/L 4.0   Chloride 98 - 111 mmol/L 103   CO2 22 - 32 mmol/L 21   Calcium 8.9 - 10.3 mg/dL 8.7    Vonna Drafts, MD 10/16/2022, 6:51 AM  PGY-2, [facility] FPTS Intern pager: [phone], text pages welcome Secure chat group [facility] Teaching Service

## 2022-10-16 NOTE — Plan of Care (Signed)
  Problem: Coping: Goal: Ability to adjust to condition or change in health will improve Outcome: Progressing   Problem: Fluid Volume: Goal: Ability to maintain a balanced intake and output will improve Outcome: Progressing   Problem: Health Behavior/Discharge Planning: Goal: Ability to identify and utilize available resources and services will improve Outcome: Progressing   Problem: Metabolic: Goal: Ability to maintain appropriate glucose levels will improve Outcome: Progressing   Problem: Nutritional: Goal: Maintenance of adequate nutrition will improve Outcome: Progressing   

## 2022-10-16 NOTE — Plan of Care (Signed)
  Problem: Skin Integrity: Goal: Risk for impaired skin integrity will decrease Outcome: Progressing   Problem: Education: Goal: Knowledge of General Education information will improve Description: Including pain rating scale, medication(s)/side effects and non-pharmacologic comfort measures Outcome: Progressing   Problem: Activity: Goal: Risk for activity intolerance will decrease Outcome: Progressing   Problem: Nutrition: Goal: Adequate nutrition will be maintained Outcome: Progressing   Problem: Elimination: Goal: Will not experience complications related to bowel motility Outcome: Progressing

## 2022-10-17 ENCOUNTER — Other Ambulatory Visit: Payer: Self-pay | Admitting: Pharmacist

## 2022-10-17 ENCOUNTER — Other Ambulatory Visit (HOSPITAL_COMMUNITY): Payer: Self-pay

## 2022-10-17 DIAGNOSIS — B20 Human immunodeficiency virus [HIV] disease: Secondary | ICD-10-CM

## 2022-10-17 MED ORDER — ESCITALOPRAM OXALATE 20 MG PO TABS
20.0000 mg | ORAL_TABLET | Freq: Every day | ORAL | 0 refills | Status: DC
  Filled 2022-10-17: qty 30, 30d supply, fill #0

## 2022-10-17 MED ORDER — BICTEGRAVIR-EMTRICITAB-TENOFOV 50-200-25 MG PO TABS
1.0000 | ORAL_TABLET | Freq: Every day | ORAL | 0 refills | Status: DC
Start: 2022-10-17 — End: 2022-11-10

## 2022-10-17 MED ORDER — MIDODRINE HCL 5 MG PO TABS
5.0000 mg | ORAL_TABLET | Freq: Three times a day (TID) | ORAL | 1 refills | Status: DC
Start: 2022-10-17 — End: 2023-01-30
  Filled 2022-10-17: qty 90, 30d supply, fill #0

## 2022-10-17 MED ORDER — BIKTARVY 50-200-25 MG PO TABS
1.0000 | ORAL_TABLET | Freq: Every day | ORAL | Status: AC
Start: 2022-10-17 — End: 2022-11-14

## 2022-10-17 NOTE — Progress Notes (Signed)
PICC line removal ordered for discharge. PICC removed per policy, dressing remains clean/dry/intact at this time. No complications noted. Discussed post-removal instructions, including laying flat for . Patient verbalizes understanding.

## 2022-10-17 NOTE — TOC Transition Note (Signed)
Transition of Care [facility]) - CM/SW Discharge Note   Patient Details  Name: Christopher Farrell MRN: [id number] Date of Birth: 17-Sep-1960  [facility] CM/SW Contact:  Janae Bridgeman, RN Phone Number: 10/17/2022, 2:43 PM   Clinical Narrative:    CM met with the patient at the bedside to discuss TOC needs to discharge home today.  The patient needs medication assistance for his discharge medications including Biktarvy for HIV.  The patient has been non-compliant about medications but plans to follow up with Dr. Orlando Penner office on November 10, 2022 at [facility].  This is noted in the AVS.  MATCH was applied for medication assistance and the ID clinic provided the patient with discharge medications for Biktarvy.  I assisted the patient with the Biktarvy medication assistance form and faxed it to the [facility].  The last page will need to be completed by the prescriber and MD team was made aware.  The patient's mother will be providing transportation to home via car.   Final next level of care: Home/Self Care (Patient will be set up for  OUtpatient PT/OT) Barriers to Discharge: Continued Medical Work up, Active Substance Use with PICC Line   Patient Goals and CMS Choice CMS Medicare.gov Compare Post Acute Care list provided to:: Patient Choice offered to / list presented to : Patient  Discharge Placement                         Discharge Plan and Services Additional resources added to the After Visit Summary for     Discharge Planning Services: CM Consult Post Acute Care Choice: Resumption of Svcs/PTA Provider                               Social Determinants of Health (SDOH) Interventions SDOH Screenings   Food Insecurity: No Food Insecurity (09/13/2022)  Housing: Patient Declined (09/13/2022)  Transportation Needs: No Transportation Needs (09/13/2022)  Utilities: Not At Risk (09/13/2022)  Depression (PHQ2-9): High Risk (09/02/2022)  Tobacco Use: High  Risk (10/12/2022)     Readmission Risk Interventions    10/06/2022   11:54 AM  Readmission Risk Prevention Plan  Transportation Screening Complete  PCP or Specialist Appt within 5-7 Days Complete  Home Care Screening Complete  Medication Review (RN CM) Complete

## 2022-10-17 NOTE — Discharge Summary (Addendum)
Family Medicine Teaching [facility] Discharge Summary  Patient name: Christopher Farrell Medical record number: [id number] Date of birth: [birth year] Age: 62 y.o. Gender: male Date of Admission: 09/05/2022  Date of Discharge: 10/17/2022 Admitting Physician: [name], MD  Primary Care Provider: Pcp, No Consultants: Cardiology, CT surgery, ID, adult dental surgery  Indication for Hospitalization: Meningitis, endocarditis  Discharge Diagnoses/Problem List:  Principal Problem for Admission: Acute bacterial endocarditis Other Problems addressed during stay:  Principal Problem:   Acute bacterial endocarditis Active Problems:   Need for assessment by dentistry for poor dentition   Hypotension   Protein-calorie malnutrition, severe (HCC)   Human immunodeficiency virus (HIV) disease (HCC)   Anxiety and depression   Anemia, chronic disease   HIV disease [facility])  Brief Hospital Course:  Christopher Farrell is a 62 y.o.male with a history of HIV not on antiretrovirals who was admitted to the [facility] Medicine Teaching Service at [facility] for bacterial meningitis due to Streptococcus pneumoniae. His hospital course is detailed below:  Meningitis 2/2 to S pneumo Upon admission in the ED, the patient had a 2-minute seizure which terminated on its own and required intubation for airway protection in the setting of altered mental status and copious secretions and admission to the ICU on 7/1.  He was extubated on 7/4 and transferred to the floor on 7/5. An LP was performed in the ED revealing bacterial meningitis. CSF and blood cultures were positive for S pneumo. Was placed on vancomycin and ceftriaxone; ultimately narrowed to rocephin for total 6 week course. Repeat brain MRI performed 7/16 which showed no major changes or mass effect but abnormal DWI.  Mental status improved greatly over stay and was normal at discharge.  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  MRI with multifocal acute/subacute ischemia in R>L cerebral hemispheres and L cerebellum.  CT Head confirmed no hemorrhage.  Upon reevaluation, due to the risk of hemorrhagic septal emboli, Plavix was held and patient was continued on aspirin 81 mg. After positive blood cultures, ID was consulted and the patient was put on IV vancomycin and ceftriaxone.  TEE showed large anterior mitral vegetation.  Vancomycin was discontinued and patient treated with ceftriaxone for 6 weeks inpatient with history of IVDU precluding outpatient treatment.  Coronary cath performed on 8/5 showed minimal CAD. TEE 8/7 showed severe MR without mitral vegetation.  CT surgery also consulted and recommended outpatient follow up for mitral valve surgery with need for prior dental surgery evaluation to consider tooth extraction.  Adult dental surgery was called, but as there was no oral surgeon on staff and patient has Medicaid, he will need to follow up with outpatient Medicaid provider.  Upon discharge, patient was stable and afebrile.  He had appointment with CT surgery and PCP established.  Antibiotic course: Vancomycin (7/1-7/3) Cefepime (7/1) Ampicillin (7/1-7/2) Metronidazole (7/1) CTX (7/1-8/12)  HIV He has not been compliant with his Biktarvy over the past year. Patient was also started on Tivicay and Descovy in the ICU and switched to [facility]. HIV load on 7/31 was 40. Upon discharge patient was continued on his Bictegravir-Emtricitab-Tenofovir ART course.  Antiviral course: Dolutegravir (7/2-7/5) Emtricitabine-Tenofovir AF (7/2-7/5) Bictegravir-Emtricitab-Tenofovir (7/6-discharge)  Hypotension Hypotensive throughout hospitalization. Started on Midodrine 10 mg TID inpatient to help with this and was later reduced to midodrine 5 mg TID.  BP was soft on this dose, but he remained  asymptomatic.  Other chronic conditions were medically  managed with home medications and formulary alternatives as necessary.  Disposition: Home w/ [facility] PT  Discharge Condition: Stable  Issues for Follow Up:  Due to noncompliance in the past with his antiretroviral therapy, it may be beneficial to discuss the risks vs. benefits of treating his HIV and advise compliance of his ART, needs follow up with ID outpatient. Cardiology recommended repeat brain MRI in 2 weeks to 1 month to rule out brain abscesses from his emboli as well as repeat TTE in 4-6 weeks. CT surgery recommends outpatient follow up in 4 weeks for MV surgery discussion/evaluation.  Requests nutrition and mobilization optimization prior to surgery. CT surgery also requests outpatient dental surgery evaluation for possible dental extraction prior to cardiac surgery, please provide Medicaid resources for extraction. Please set up appointment with dental surgery for extraction.  Follow up with CBC and CMP outpatient. Watch BP, which has been soft and patient requiring new midodrine 5 mg TID.  Discharge Exam:  Vitals:   10/16/22 1951 10/17/22 0810  BP: 101/74 92/73  Pulse: 94 97  Resp: 18 18  Temp: 98.2 F (36.8 C) 98 F (36.7 C)  SpO2: 97% 98%   Physical Exam: General: Thin adult male resting in bed. Eyes: No conjunctival erythema or injections. No conjunctival pallor. ENTM: MMM. Neck: No LAD. Cardiovascular: 3/6 systolic ejection murmur, no rubs or gallops. Normal S1/S2. Respiratory: Normal WOB on room air, CTAB Gastrointestinal: No TTP, nondistended MSK: Deconditioned, but ambulatory and moving all extremities appropriately Derm: No rashes grossly Neuro: A&Ox4 Psych: Quiet, flat affect  Significant Procedures: - Cardiac cath 8/5: Minimal obstructive CAD of R-dominant system, overall benign - PFTs from 8/7: Not supporting COPD diagnosis, FEV1/FVC 90% of predicted.  Significant Labs and Imaging:  No results for input(s): "WBC", "HGB", "HCT", "PLT" in the last 48  hours. Recent Labs  Lab 10/17/22 0146  CREATININE 0.84   - MRI Head: Multifocal acute/subacute ischemia R>L (parietal/occipital lobe lesions) - TEE 8/7: LVEF 60-65%, no thrombus, severe mitral valve regurgitation, no vegetation  Results/Tests Pending at Time of Discharge: None  Discharge Medications:  Allergies as of 10/17/2022   No Known Allergies      Medication List     STOP taking these medications    amoxicillin-clavulanate 875-125 MG tablet Commonly known as: AUGMENTIN   azithromycin 250 MG tablet Commonly known as: ZITHROMAX   nicotine polacrilex 4 MG gum Commonly known as: NICORETTE   nystatin 100000 UNIT/ML suspension Commonly known as: MYCOSTATIN   predniSONE 20 MG tablet Commonly known as: DELTASONE       TAKE these medications    acetaminophen 500 MG tablet Commonly known as: TYLENOL Take 500 mg by mouth every 6 (six) hours as needed for moderate pain.   albuterol 108 (90 Base) MCG/ACT inhaler Commonly known as: VENTOLIN HFA Inhale 2 puffs into the lungs every 6 (six) hours as needed for wheezing or shortness of breath.   bictegravir-emtricitabine-tenofovir AF 50-200-25 MG Tabs tablet Commonly known as: BIKTARVY Take 1 tablet by mouth daily. What changed: Another medication with the same name was removed. Continue taking this medication, and follow the directions you see here.   escitalopram 20 MG tablet Commonly known as: LEXAPRO Take 1 tablet (20 mg total) by mouth daily.   midodrine 5 MG tablet Commonly known as: PROAMATINE Take 1 tablet (5 mg total) by mouth 3 (three) times daily with meals.       Discharge Instructions: Please refer  to Patient Instructions section of EMR for full details.  Patient was counseled important signs and symptoms that should prompt return to medical care, changes in medications, dietary instructions, activity restrictions, and follow up appointments.   Follow-Up Appointments: Future Appointments  Date  Time Provider Department Center  10/19/2022 10:30 AM Alicia Amel, MD [facility]  11/14/2022  4:30 PM Eugenio Hoes, MD [facility] Ladell Pier, Dimitry, MD 10/17/2022, 1:40 PM PGY-1, [facility]  I was personally present and performed medical decision making activities of this service and have verified that the service and findings are accurately documented in the student's note.  Shelby Mattocks, DO                  10/17/2022, 1:40 PM

## 2022-10-17 NOTE — Progress Notes (Signed)
MATCH MEDICATION ASSISTANCE CARD Pharmacies please call: [phone] for claim processing assistance.  Rx BIN: [id number] Rx Group: [id number] Rx PCN: [id number] Relationship Code: 1 Person Code: [id number]  Patient ID (MRN): Redge Gainer  [id number]    Patient Name: Eriberto Sedeno    Patient DOB: 1960/12/22    Discharge Date: 10/17/2022    Expiration Date: 10/24/2022 (must be filled within 7 days of discharge)   Dear Bonita Quin have been approved to have the prescriptions written by your discharging physician filled through our [facility] (Medication Assistance Through [facility]) program. This program allows for a one-time (no refills) 34-day supply of selected medications for a low copay amount.  The copay is $3.00 per prescription. For instance, if you have one prescription, you will pay $3.00; for two prescriptions, you pay $6.00; for three prescriptions, you pay $9.00; and so on. Only certain pharmacies are participating in this program with [facility]. You will need to select one of the pharmacies from the attached lists and take your prescriptions, this letter, and your photo ID to one of the participating pharmacies.  We are excited that you are able to use the [facility] program to get your medications. These prescriptions must be filled within 7 days of hospital discharge or they will no longer be valid for the [facility] program. Should you have any problems with your prescriptions please contact your case management team member at [phone] for [facility] or [phone] for [facility].  Thank you, [facility]

## 2022-10-17 NOTE — Progress Notes (Signed)
Medication Samples have been provided to the patient.  Drug name: Biktarvy        Strength: 50/200/25 mg       Qty: 4 bottles (28 days)   LOT: [id number]   Exp.Date: 10/2024  Dosing instructions: Take one tablet by mouth once daily  The patient has been instructed regarding the correct time, dose, and frequency of taking this medication, including desired effects and most common side effects.    L. Jannette Fogo, PharmD, BCIDP, AAHIVP, CPP Clinical Pharmacist Practitioner Infectious Diseases Clinical Pharmacist [facility] 02/17/2020, 10:07 AM

## 2022-10-19 ENCOUNTER — Encounter: Payer: Self-pay | Admitting: Student

## 2022-10-19 ENCOUNTER — Ambulatory Visit (INDEPENDENT_AMBULATORY_CARE_PROVIDER_SITE_OTHER): Payer: Medicaid Other | Admitting: Student

## 2022-10-19 VITALS — BP 96/77 | HR 102 | Ht 64.0 in | Wt 125.5 lb

## 2022-10-19 DIAGNOSIS — Z0120 Encounter for dental examination and cleaning without abnormal findings: Secondary | ICD-10-CM

## 2022-10-19 DIAGNOSIS — I340 Nonrheumatic mitral (valve) insufficiency: Secondary | ICD-10-CM | POA: Diagnosis present

## 2022-10-19 DIAGNOSIS — B20 Human immunodeficiency virus [HIV] disease: Secondary | ICD-10-CM

## 2022-10-19 MED ORDER — BIKTARVY 50-200-25 MG PO TABS
1.0000 | ORAL_TABLET | Freq: Every day | ORAL | 3 refills | Status: DC
Start: 2022-10-19 — End: 2022-11-10

## 2022-10-19 NOTE — Patient Instructions (Addendum)
I am going to set you up with the dental surgeons who will help get your teeth taken care of so that you can have your surgery with Dr. Leafy Ro as soon as possible. They will call you. Be sure to be on the look out for a call from a number you don't recognize!  Be on the lookout for a call from the physical therapists about getting you set up for strengthening exercises. In the meantime, walking is your best friend!   I am going to enjoy being your doctor! Please call us or send [place] a MyChart message if you are not feeling well or if you need help navigating care with all of these specialists.    I am sending your Bl[facility] prescription to the [facility] on [address].   Eliezer Mccoy, MD

## 2022-10-19 NOTE — Progress Notes (Signed)
    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  Received 6 weeks of ceftriaxone in hospital and returned home two days ago. There was a perforation of the mitral valve related to the endocarditis that will require repair as it has led to severe mitral regurgitation as identified on serial echocardiogram in hospital. He was evaluated by CT surgery in hospital with plans for interval repair once he is able to have dental surgery to remove his teeth as he has multiple caries and ?abscess identified on orthopantogram.   He has trying to stay active since leaving the hospital. Walking up and down his street for exercise. No issues with swelling or exercise intolerance.   Was set up for HHPT upon discharge but they have not been out yet.    Asking about returning to work. Works as a Public affairs consultant at [employer].    PERTINENT  PMH / PSH: HIV on Biktarvy, has ID follow-up in two weeks  OBJECTIVE:   BP 96/77   Pulse (!) 102   Ht 5\' 4"  (1.626 m)   Wt 125 lb 8 oz (56.9 kg)   SpO2 100%   BMI 21.54 kg/m   Gen: Well-appearing and NAD  HENT: Poor dentition Cardio: Regular rate and rhythm 4/6 systolic murmur heard best at the apex  Pulm: Normal WOB on RA, lungs clear in all fields  Neuro: Gait normal, strength is intact throughout  Psych: He is in good spirits  ASSESSMENT/PLAN:   Severe mitral regurgitation Needs MVR but pending dental extractions. Doing quite well, exercising at home. HHPT pending.  - I have placed an urgent referral to [facility] for extraction given complex medical history  - Has CTS follow-up on 9/9, not sure if we can get surgery done before then, but hopefully sooner rather than later - Encourage exercise as tolerated, home health PT should help here as well - BP low-normal today, continue midodrine for support  HIV  disease [facility]) Assures me he has been taking his Biktarvy since discharge. Plans to keep follow-up with Dr. Renold Don on 9/5.      Eliezer Mccoy, MD [facility]

## 2022-10-21 NOTE — Assessment & Plan Note (Signed)
Needs MVR but pending dental extractions. Doing quite well, exercising at home. HHPT pending.  - I have placed an urgent referral to [facility] for extraction given complex medical history  - Has CTS follow-up on 9/9, not sure if we can get surgery done before then, but hopefully sooner rather than later - Encourage exercise as tolerated, home health PT should help here as well - BP low-normal today, continue midodrine for support

## 2022-10-21 NOTE — Assessment & Plan Note (Signed)
Assures me he has been taking his Biktarvy since discharge. Plans to keep follow-up with Dr. Renold Don on 9/5.

## 2022-10-24 ENCOUNTER — Other Ambulatory Visit: Payer: Self-pay | Admitting: Nurse Practitioner

## 2022-10-24 NOTE — Progress Notes (Unsigned)
The ASCVD Risk score (Arnett DK, et al., 2019) failed to calculate for the following reasons:   The patient has a prior MI or stroke diagnosis  

## 2022-10-26 ENCOUNTER — Ambulatory Visit (INDEPENDENT_AMBULATORY_CARE_PROVIDER_SITE_OTHER): Payer: Medicaid Other | Admitting: Nurse Practitioner

## 2022-10-26 ENCOUNTER — Other Ambulatory Visit: Payer: Self-pay | Admitting: Nurse Practitioner

## 2022-10-26 ENCOUNTER — Encounter: Payer: Self-pay | Admitting: Nurse Practitioner

## 2022-10-26 VITALS — BP 96/66 | HR 96 | Temp 97.2°F | Wt 130.0 lb

## 2022-10-26 DIAGNOSIS — I76 Septic arterial embolism: Secondary | ICD-10-CM | POA: Diagnosis not present

## 2022-10-26 DIAGNOSIS — F32A Depression, unspecified: Secondary | ICD-10-CM

## 2022-10-26 DIAGNOSIS — I6782 Cerebral ischemia: Secondary | ICD-10-CM | POA: Diagnosis not present

## 2022-10-26 DIAGNOSIS — Z0120 Encounter for dental examination and cleaning without abnormal findings: Secondary | ICD-10-CM

## 2022-10-26 DIAGNOSIS — F17200 Nicotine dependence, unspecified, uncomplicated: Secondary | ICD-10-CM

## 2022-10-26 DIAGNOSIS — Z09 Encounter for follow-up examination after completed treatment for conditions other than malignant neoplasm: Secondary | ICD-10-CM

## 2022-10-26 DIAGNOSIS — I669 Occlusion and stenosis of unspecified cerebral artery: Secondary | ICD-10-CM

## 2022-10-26 DIAGNOSIS — I330 Acute and subacute infective endocarditis: Secondary | ICD-10-CM

## 2022-10-26 DIAGNOSIS — B20 Human immunodeficiency virus [HIV] disease: Secondary | ICD-10-CM

## 2022-10-26 MED ORDER — ESCITALOPRAM OXALATE 20 MG PO TABS
20.0000 mg | ORAL_TABLET | Freq: Every day | ORAL | 0 refills | Status: DC
Start: 2022-10-26 — End: 2023-02-13

## 2022-10-26 NOTE — Assessment & Plan Note (Signed)
Oral surgery referral for tooth extraction is pending

## 2022-10-26 NOTE — Assessment & Plan Note (Addendum)
Patient referred to neurology not currently on a statin Lab Results  Component Value Date   CHOL 58 09/07/2022   HDL <10 (L) 09/07/2022   LDLCALC NOT CALCULATED 09/07/2022   LDLDIRECT 47 09/10/2022   TRIG 122 09/07/2022   CHOLHDL NOT CALCULATED 09/07/2022  LDL at goal of less than 55 no statin is needed Patient encouraged to follow-up with neurology Smoking cessation encouraged

## 2022-10-26 NOTE — Assessment & Plan Note (Signed)
Hospital discharge summary labs, imaging studies and recommendations reviewed Encouraged to keep upcoming appointment with cardiothoracic surgeon Referral to oral surgery is pending Patient referred to neurology Follow-up in 2 months

## 2022-10-26 NOTE — Assessment & Plan Note (Signed)
Denies fever chills chest pain Referral to oral surgeon is pending patient encouraged to pick any ongoing counts Follow-up with cardiothoracic surgeon as planned

## 2022-10-26 NOTE — Patient Instructions (Addendum)
You have been referred to the neurology at [facility] please make sure to follow-up with them The oral surgeon office will be calling you to also schedule an appointment as discussed  Follow up with Raymondo Band, MD (Infectious Diseases) on 11/10/2022; You are scheduled for a follow up appointment on 11/10/2022 at 9 am.  It is important that you exercise regularly at least 30 minutes 5 times a week as tolerated  Think about what you will eat, plan ahead. Choose " clean, green, fresh or frozen" over canned, processed or packaged foods which are more sugary, salty and fatty. 70 to 75% of food eaten should be vegetables and fruit. Three meals at set times with snacks allowed between meals, but they must be fruit or vegetables. Aim to eat over a 12 hour period , example 7 am to 7 pm, and STOP after  your last meal of the day. Drink water,generally about 64 ounces per day, no other drink is as healthy. Fruit juice is best enjoyed in a healthy way, by EATING the fruit.  Thanks for choosing [facility] we consider it a privelige to serve you.

## 2022-10-26 NOTE — Assessment & Plan Note (Signed)
Continue Biktarvy as ordered and maintain close follow-up with infectious disease specialist

## 2022-10-26 NOTE — Assessment & Plan Note (Signed)
Need to avoid smoking cigarettes discussed in the office today.  Cessation encouraged
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
## 2022-10-27 LAB — CMP14+EGFR
ALT: 23 IU/L (ref 0–44)
AST: 24 IU/L (ref 0–40)
Albumin: 3.9 g/dL (ref 3.9–4.9)
Alkaline Phosphatase: 84 IU/L (ref 44–121)
BUN/Creatinine Ratio: 12 (ref 10–24)
BUN: 11 mg/dL (ref 8–27)
Bilirubin Total: 0.4 mg/dL (ref 0.0–1.2)
CO2: 21 mmol/L (ref 20–29)
Calcium: 9.2 mg/dL (ref 8.6–10.2)
Chloride: 106 mmol/L (ref 96–106)
Creatinine, Ser: 0.89 mg/dL (ref 0.76–1.27)
Globulin, Total: 3.1 g/dL (ref 1.5–4.5)
Glucose: 109 mg/dL — ABNORMAL HIGH (ref 70–99)
Potassium: 4.2 mmol/L (ref 3.5–5.2)
Sodium: 141 mmol/L (ref 134–144)
Total Protein: 7 g/dL (ref 6.0–8.5)
eGFR: 97 mL/min/{1.73_m2} (ref >59)

## 2022-10-27 LAB — CBC
Hematocrit: 30.2 % — ABNORMAL LOW (ref 37.5–51.0)
Hemoglobin: 10.4 g/dL — ABNORMAL LOW (ref 13.0–17.7)
MCH: 31.6 pg (ref 26.6–33.0)
MCHC: 34.4 g/dL (ref 31.5–35.7)
MCV: 92 fL (ref 79–97)
Platelets: 282 10*3/uL (ref 150–450)
RBC: 3.29 x10E6/uL — ABNORMAL LOW (ref 4.14–5.80)
RDW: 14.9 % (ref 11.6–15.4)
WBC: 7.6 10*3/uL (ref 3.4–10.8)

## 2022-10-27 LAB — LDL CHOLESTEROL, DIRECT: LDL Direct: 39 mg/dL (ref 0–99)

## 2022-11-10 ENCOUNTER — Ambulatory Visit (INDEPENDENT_AMBULATORY_CARE_PROVIDER_SITE_OTHER): Payer: Self-pay | Admitting: Internal Medicine

## 2022-11-10 ENCOUNTER — Encounter: Payer: Self-pay | Admitting: Internal Medicine

## 2022-11-10 ENCOUNTER — Other Ambulatory Visit: Payer: Self-pay

## 2022-11-10 ENCOUNTER — Telehealth: Payer: Self-pay

## 2022-11-10 VITALS — BP 125/101 | HR 99 | Temp 97.9°F | Ht 63.0 in | Wt 136.0 lb

## 2022-11-10 DIAGNOSIS — I509 Heart failure, unspecified: Secondary | ICD-10-CM

## 2022-11-10 DIAGNOSIS — B20 Human immunodeficiency virus [HIV] disease: Secondary | ICD-10-CM

## 2022-11-10 DIAGNOSIS — I340 Nonrheumatic mitral (valve) insufficiency: Secondary | ICD-10-CM

## 2022-11-10 DIAGNOSIS — F17210 Nicotine dependence, cigarettes, uncomplicated: Secondary | ICD-10-CM

## 2022-11-10 MED ORDER — ALBUTEROL SULFATE HFA 108 (90 BASE) MCG/ACT IN AERS: 2.0000 | INHALATION_SPRAY | Freq: Four times a day (QID) | RESPIRATORY_TRACT | 2 refills | Status: DC | PRN

## 2022-11-10 MED ORDER — FUROSEMIDE 40 MG PO TABS: 40.0000 mg | ORAL_TABLET | Freq: Every day | ORAL | 0 refills | Status: DC

## 2022-11-10 MED ORDER — BICTEGRAVIR-EMTRICITAB-TENOFOV 50-200-25 MG PO TABS: 1.0000 | ORAL_TABLET | Freq: Every day | ORAL | 11 refills | Status: DC

## 2022-11-10 NOTE — Telephone Encounter (Signed)
No answer lvm for pt to call back. kh

## 2022-11-10 NOTE — Patient Instructions (Signed)
For your breathing, I think you have heart failure, and maybe some degree of smoking related lung disease. Do the below 1) make sure you follow up with your CT surgeon on 9/9 2) I have referred you to cardiology as well to evaluate and manage heart failure 3) start lasix 40 mg once a day; the goal is to get you to loose about 3-5 pounds so check weight daily. Once you feel better with breathing, you can take lasix as needed with weight increases and breathing is worse 4) I have also given you an inhaler (albuterol) which you can use as needed also)     Hiv Will recheck cd4 and viral load today See our finance counselor today Follow up with [place] in 4-6 weeks

## 2022-11-10 NOTE — Telephone Encounter (Signed)
-----   Message from [place] sent at 11/09/2022  4:36 PM EDT ----- Regarding: referal to dental surgery decloined Referral to dental surgery was declined due to no insurance . He would need to call around to offices and find out who would set him up with a payment plan.  Please have the patient come to the office to get a list of dentist around here that will be willing to see the patient. Thanks   .
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
## 2022-11-10 NOTE — Addendum Note (Signed)
Addended byRutha Bouchard T on: 11/10/2022 09:36 AM   Modules accepted: Orders

## 2022-11-11 LAB — T-HELPER CELLS (CD4) COUNT (NOT AT ARMC)
CD4 % Helper T Cell: 26 % — ABNORMAL LOW (ref 33–65)
CD4 T Cell Abs: 917 /uL (ref 400–1790)

## 2022-11-12 LAB — COMPLETE METABOLIC PANEL WITH GFR
AG Ratio: 1.3 (calc) (ref 1.0–2.5)
ALT: 53 U/L — ABNORMAL HIGH (ref 9–46)
AST: 42 U/L — ABNORMAL HIGH (ref 10–35)
Albumin: 3.9 g/dL (ref 3.6–5.1)
Alkaline phosphatase (APISO): 103 U/L (ref 35–144)
BUN: 15 mg/dL (ref 7–25)
CO2: 21 mmol/L (ref 20–32)
Calcium: 8.7 mg/dL (ref 8.6–10.3)
Chloride: 108 mmol/L (ref 98–110)
Creat: 0.92 mg/dL (ref 0.70–1.35)
Globulin: 3 g/dL (ref 1.9–3.7)
Glucose, Bld: 106 mg/dL — ABNORMAL HIGH (ref 65–99)
Potassium: 4.3 mmol/L (ref 3.5–5.3)
Sodium: 140 mmol/L (ref 135–146)
Total Bilirubin: 0.7 mg/dL (ref 0.2–1.2)
Total Protein: 6.9 g/dL (ref 6.1–8.1)
eGFR: 95 mL/min/{1.73_m2} (ref >=60)

## 2022-11-12 LAB — CBC
HCT: 41.1 % (ref 38.5–50.0)
Hemoglobin: 13.7 g/dL (ref 13.2–17.1)
MCH: 31.3 pg (ref 27.0–33.0)
MCHC: 33.3 g/dL (ref 32.0–36.0)
MCV: 93.8 fL (ref 80.0–100.0)
MPV: 9.5 fL (ref 7.5–12.5)
Platelets: 215 10*3/uL (ref 140–400)
RBC: 4.38 10*6/uL (ref 4.20–5.80)
RDW: 14.7 % (ref 11.0–15.0)
WBC: 8.2 10*3/uL (ref 3.8–10.8)

## 2022-11-12 LAB — HIV-1 RNA QUANT-NO REFLEX-BLD
HIV 1 RNA Quant: 68 {copies}/mL — ABNORMAL HIGH
HIV-1 RNA Quant, Log: 1.83 {Log_copies}/mL — ABNORMAL HIGH

## 2022-11-12 LAB — BRAIN NATRIURETIC PEPTIDE: Brain Natriuretic Peptide: 1604 pg/mL — ABNORMAL HIGH (ref <100)
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
## 2022-11-14 ENCOUNTER — Ambulatory Visit: Payer: Self-pay | Admitting: Thoracic Surgery (Cardiothoracic Vascular Surgery)

## 2022-11-15 ENCOUNTER — Encounter: Payer: Self-pay | Admitting: Thoracic Surgery (Cardiothoracic Vascular Surgery)

## 2022-12-08 ENCOUNTER — Ambulatory Visit: Payer: Self-pay | Admitting: Internal Medicine

## 2022-12-08 ENCOUNTER — Telehealth: Payer: Self-pay

## 2022-12-08 NOTE — Telephone Encounter (Signed)
Attempted to reach to reschedule after NO SHOW 12/08/22 with Dr. Renold Don. Unable to reach patient and unable to leave vm.

## 2022-12-09 ENCOUNTER — Telehealth: Payer: Self-pay

## 2022-12-09 ENCOUNTER — Other Ambulatory Visit (HOSPITAL_COMMUNITY): Payer: Self-pay

## 2022-12-09 NOTE — Telephone Encounter (Signed)
Received PA for [employer]. Patient's medicaid is not active and will need to go through [facility]. Juanita Laster, RMA

## 2022-12-14 ENCOUNTER — Telehealth: Payer: Self-pay

## 2022-12-14 ENCOUNTER — Ambulatory Visit: Payer: Self-pay | Admitting: Internal Medicine

## 2022-12-14 NOTE — Telephone Encounter (Signed)
Left message attempted to reschedule no show 12/14/2022 with Dr.Vu.

## 2022-12-16 ENCOUNTER — Encounter: Payer: Self-pay | Admitting: Internal Medicine

## 2022-12-16 ENCOUNTER — Ambulatory Visit (INDEPENDENT_AMBULATORY_CARE_PROVIDER_SITE_OTHER): Payer: Medicaid Other | Admitting: Internal Medicine

## 2022-12-16 ENCOUNTER — Other Ambulatory Visit: Payer: Self-pay

## 2022-12-16 VITALS — BP 127/96 | HR 98 | Temp 96.1°F | Ht 64.0 in | Wt 127.0 lb

## 2022-12-16 DIAGNOSIS — B20 Human immunodeficiency virus [HIV] disease: Secondary | ICD-10-CM

## 2022-12-16 DIAGNOSIS — I509 Heart failure, unspecified: Secondary | ICD-10-CM | POA: Diagnosis not present

## 2022-12-16 MED ORDER — FUROSEMIDE 40 MG PO TABS: 40.0000 mg | ORAL_TABLET | Freq: Every day | ORAL | 1 refills | Status: DC
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
## 2022-12-16 NOTE — Patient Instructions (Addendum)
You have heart failure. This is serious. This was likely from previous heart valve infection that was treated. But now your heart failure will need to have cardiology management   I placed a referral today to cardiology. Make sure you have your phone and check mail in case they call you about appointment  Start taking lasix (furosemide) 40 mg tablet 1 tablet each day in the morning after breatkfast   Check your weight every day. If it goes up by more than 2 pounds a day take an extra dose of lasix   Continue taking biktarvy for your hiv   Make sure you see your primary care provider Christopher Farrell for further coordniation of non-hiv care

## 2022-12-18 LAB — BASIC METABOLIC PANEL
BUN: 15 mg/dL (ref 7–25)
CO2: 22 mmol/L (ref 20–32)
Calcium: 8.9 mg/dL (ref 8.6–10.3)
Chloride: 100 mmol/L (ref 98–110)
Creat: 0.85 mg/dL (ref 0.70–1.35)
Glucose, Bld: 91 mg/dL (ref 65–99)
Potassium: 3.9 mmol/L (ref 3.5–5.3)
Sodium: 138 mmol/L (ref 135–146)

## 2022-12-18 LAB — BRAIN NATRIURETIC PEPTIDE: Brain Natriuretic Peptide: 1671 pg/mL — ABNORMAL HIGH (ref <100)

## 2022-12-18 LAB — HIV-1 RNA QUANT-NO REFLEX-BLD
HIV 1 RNA Quant: 239 {copies}/mL — ABNORMAL HIGH
HIV-1 RNA Quant, Log: 2.38 {Log} — ABNORMAL HIGH

## 2022-12-20 ENCOUNTER — Telehealth: Payer: Self-pay | Admitting: Cardiovascular Disease

## 2022-12-20 NOTE — Telephone Encounter (Signed)
Pt c/o Shortness Of Breath: STAT if SOB developed within the last 24 hours or pt is noticeably SOB on the phone  1. Are you currently SOB (can you hear that pt is SOB on the phone)? No  2. How long have you been experiencing SOB? Since June  3. Are you SOB when sitting or when up moving around? Moving around  4. Are you currently experiencing any other symptoms? No    Pt's mother also states that pt has jaw swelling

## 2022-12-20 NOTE — Telephone Encounter (Signed)
Returned call to patient, no DPR on file to speak with mother. Verbal permission given to speak with mother.    They state he is not able to do any work due to his face being swollen. This has been going on for about two weeks. Denies no new medication changes, or new food. Advised to call PCP due to swelling.   She states he has been short of breath since he got out of the hospital. He is noting shortness of breath while he is at rest. He feels he is unable to get up and do anything. He was in the hospital back in July and was told to follow up with Cardiology, but they just got around to making said appointment.   Patient scheduled earlier with Marjie Skiff 10/22

## 2022-12-23 NOTE — Progress Notes (Unsigned)
Cardiology Office Note:    Date:  12/27/2022   ID:  [name], DOB 10-07-1960, MRN [id number]  PCP:  Christopher Beers, FNP  Cardiologist:  Christopher Si, MD     Referring MD: Christopher Beers, FNP   Chief Complaint: hospital follow-up of endocarditis and concern for CHF  History of Present Illness:    Christopher Farrell is a 62 y.o. 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 Duke Farrell and presents today for hospital follow-up of endocarditis and concern for CHF.    Patient was first seen by Dr. Duke Farrell during recent hospitalization.  He was admitted from 09/05/2022 to 10/17/2022 for severe sepsis secondary to multifocal pneumonia and bacterial meningitis due to Streptococcus pneumoniae after presenting with a fever and cough. He had not been taking his anti-retrovirals for HIV. He had a seizure while in the ED.  He initially required intubation for airway protection in the setting of altered mental status and copious secretions but was extubated on 7/4.  A lumbar puncture was performed in the ED revealing bacterial meningitis and CSF and blood cultures were positive for Streptococcus pneumoniae.  He was started on IV antibiotics.  Brain MRI showed multifocal acute/subacute ischemia in bilateral cerebral hemispheres (right greater than left) in the left cerebellar.  Head CT showed no hemorrhage.  TTE on 7/2 showed LVEF of 60-65% with a solid-appearing mass on the atrial side of the mid/distal anterior leaflet of the mitral valve.  He underwent a TEE on 7/8 which showed LVEF of 65-70% with a multilobular vegetation attached to the anterior mitral valve leaflet that measured approximately 1.7 x 0.85 cm.  The vegetation was broad-based and mostly fix but had small mobile components consistent with  antibiotics. There was an associated pseudoaneurysm and perforation of the anterior leaflet with severe mitral regurgitation. Patient's stroke was felt to be due to an embolic shower in setting of endocarditis. He was treated with IV antibiotics for several weeks in the hospital given history of IV drug use. Repeat TTE 8/2 showed LVEF of 60-65% with grade 3 diastolic dysfunction, normal RV size and function, partial flail of anterior leaflet of the mitral valve with severe mitral regurgitation, and severely elevated PASP. Therefore, he underwent R/ LHC on 8/5 which showed essential normal coronaries with only minimal non-obstructive disease and pulmonary hypertension with mildly elevated left and right sided filling pressures (LVEDP 20 mmHg, RA pressure 11 mmHg, PWP 21 mmHg, PA pressure 64/22 with a mean of 41 mmHg, PVR 3.2 Woods units). Repeat TEE on 8/7 showed LVEF of 60-65% with a flail anterior mitral leaflet and severe mitral regurgitation but no mention of persistent vegetation. Hospitalization was complicated by hypotension and he was treated with Midodrine. CT surgery was consulted and recommended outpatient follow-up for mitral valve surgery with need for dental evaluation for possible tooth extraction prior to this. Follow-up visit with Dr. Leafy Ro was scheduled for 11/2022.  Repeat brain MRI in 2 weeks was also recommended  to rule out brain abscess from his emboli. However, it does not look like he every had the repeat brain MRI and he no-showed his appointment with Dr. Leafy Ro.   Patient was recently seen by Dr. Renold Don (Infectious Disease) on 12/16/2022 at which time he reported shortness of breath and facial swelling. BNP was markedly elevated at 1,671. He  was prescribed Lasix and instructed to follow-up with CT surgery. He was also referred back to Cardiology for presumed CHF.   Patient presents today for follow-up. Here with his mom. He reports improvement in his shortness of breath and facial  swelling with the Lasix. He now denies any shortness of breath. He reports some orthopnea prior to being starting on Lasix but this has also resolved. No PND or lower extremity edema. Weight is down 9 lbs since starting Lasix. No chest pain. He reports some occasional mild lightheadedness/ dizziness after bending over and then sitting up again but no palpitations or syncope. BP remains soft on Midodrine. He has had a very slow recovering since his hospitalization a couple of months. Patient states he is not very active due to residual bilateral leg weakness from stroke and has difficulty walking long distance with this. However, he does states he can walk around the house and the store and denies any chest pain or shortness of breath with this.   EKGs/Labs/Other Studies Reviewed:    The following studies were reviewed:  TTE 09/06/2022: Impressions: 1. Left ventricular ejection fraction, by estimation, is 60 to 65%. The  left ventricle has normal function. The left ventricle has no regional  wall motion abnormalities. Left ventricular diastolic parameters were  normal.   2. Right ventricular systolic function is normal. The right ventricular  size is normal.   3. There is a solid appearing mass on the atrial side of the mid/distal  anterior leaflet Maximum diameter 1.5 cm Consider f/u TEE and cardiac MRI  to further assess. The mitral valve is abnormal. Trivial mitral valve  regurgitation. No evidence of mitral  stenosis.   4. The aortic valve is tricuspid. There is mild calcification of the  aortic valve. There is mild thickening of the aortic valve. Aortic valve  regurgitation is not visualized. Aortic valve sclerosis is present, with  no evidence of aortic valve stenosis.   5. The inferior vena cava is dilated in size with >50% respiratory  variability, suggesting right atrial pressure of 8 mmHg.  _______________  TEE 09/12/2022: Impressions:  1. Left ventricular ejection fraction, by  estimation, is 65 to 70%. The  left ventricle has normal function. The left ventricle has no regional  wall motion abnormalities.   2. Right ventricular systolic function is normal. The right ventricular  size is normal.   3. No left atrial/left atrial appendage thrombus was detected.   4. There is a multilobular vegetation attached to the anterior mitral  valve leaflet that measures approximately 1.7x 0.85cm. The vegetation is  broad based and mostly fixed, but has small mobile components. There is an  associated pseudoaneurysm and  perforation of the anterior leaflet. There is severe mitral insufficiency  directed laterally, towards the ostium of the left atrial appendage. By  the PISA method, the ERO area is 0.8 cm sq, regurgitant volume 100 ml. The  mitral valve is abnormal. Severe  mitral valve regurgitation. No evidence of mitral stenosis.   5. The tricuspid valve is abnormal.   6. The aortic valve is tricuspid. Aortic valve regurgitation is not  visualized. No aortic stenosis is present.  _______________  TTE 10/07/2022: Impressions:  1. Left ventricular ejection fraction, by estimation, is 60 to 65%. The  left ventricle has normal function. The left ventricle has no regional  wall motion abnormalities. Left ventricular diastolic parameters are  consistent with Grade III diastolic  dysfunction (restrictive).   2. Right ventricular systolic function is  normal. The right ventricular  size is normal. There is severely elevated pulmonary artery systolic  pressure. The estimated right ventricular systolic pressure is 65.8 mmHg.   3. Left atrial size was severely dilated.   4. The mitral valve is abnormal. The anterior leaflet has partial flail  versus perforation. I do not see a definite vegetation but cannot rule  out. Severe mitral valve regurgitation. No evidence of mitral stenosis.   5. Tricuspid valve regurgitation is moderate.   6. The aortic valve is tricuspid. Aortic valve  regurgitation is not  visualized. No aortic stenosis is present.   7. The inferior vena cava is normal in size with <50% respiratory  variability, suggesting right atrial pressure of 8 mmHg.   8. Suggest TEE evaluation of mitral regurgitation/?vegetation.  _______________  Right/ Left Cardiac Catheterization 10/10/2022: 1.  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 2.  Fick cardiac output of 6.2 L/min and Fick cardiac index of 3.9 L/min/m with the following hemodynamics:             Right atrial pressure mean of 11 mmHg             RV pressure 62/3 with an end-diastolic pressure of 14 mmHg             Wedge pressure mean of 21 mmHg with V waves to 31 mmHg             PA pressure 64/22 with a mean of 41 mmHg             PVR 3.2 Woods units 3.  LVEDP of 20 mmHg   Recommendation: Continue evaluation for surgical mitral valve intervention.  Diagnostic Dominance: Right   _______________  TEE 10/12/2022: Impressions: 1. Left ventricular ejection fraction, by estimation, is 60 to 65%. The  left ventricle has normal function. The left ventricle has no regional  wall motion abnormalities.   2. Right ventricular systolic function is normal. The right ventricular  size is normal.   3. No left atrial/left atrial appendage thrombus was detected.   4. The anterior mitral leaflet is flail. The mitral valve is abnormal.  Severe mitral valve regurgitation. No evidence of mitral stenosis.   5. Tricuspid valve regurgitation is moderate.   6. The aortic valve is normal in structure. Aortic valve regurgitation is  not visualized. No aortic stenosis is present.   7. The inferior vena cava is normal in size with greater than 50%  respiratory variability, suggesting right atrial pressure of 3 mmHg.   8. Agitated saline contrast bubble study was negative, with no evidence  of any interatrial shunt.    EKG:  EKG not  ordered today.   Recent Labs: 09/08/2022: Magnesium  2.0 11/10/2022: ALT 53; Hemoglobin 13.7; Platelets 215 12/16/2022: Brain Natriuretic Peptide 1,671; BUN 15; Creat 0.85; Potassium 3.9; Sodium 138  Recent Lipid Panel    Component Value Date/Time   CHOL 58 09/07/2022 0927   TRIG 122 09/07/2022 0927   TRIG 11 02/14/2008 0000   HDL <10 (L) 09/07/2022 0927   CHOLHDL NOT CALCULATED 09/07/2022 0927   VLDL 24 09/07/2022 0927   LDLCALC NOT CALCULATED 09/07/2022 0927   LDLCALC 62 06/29/2021 1010   LDLDIRECT 39 10/26/2022 1212   LDLDIRECT 47 09/10/2022 0930    Physical Exam:    Vital Signs: BP 102/77 (BP Location: Left Arm, Patient Position: Sitting, Cuff Size: Normal)   Pulse 95   Ht 5\' 4"  (1.626 m)   Wt  118 lb (53.5 kg)   SpO2 97%   BMI 20.25 kg/m     Wt Readings from Last 3 Encounters:  12/27/22 118 lb (53.5 kg)  12/16/22 127 lb (57.6 kg)  11/10/22 136 lb (61.7 kg)     General: 62 y.o. thin African-American male in no acute distress. HEENT: Normocephalic and atraumatic. Sclera clear.  Neck: Supple. Positive hepatojugular reflux. Heart: RRR. III/VI holosystolic murmur heard throughout (loudest at apex).  Lungs: No increased work of breathing. Clear to ausculation bilaterally. No wheezes, rhonchi, or rales.  Extremities: No lower extremity edema.  Skin: Warm and dry. Neuro: No focal deficits. Psych: Normal affect. Responds appropriately.  Assessment:    1. Bacterial endocarditis, unspecified chronicity   2. Severe mitral regurgitation   3. Chronic diastolic CHF (congestive heart failure) (HCC)   4. Hypotension, unspecified hypotension type     Plan:    Endocarditis of Mitral Valve Severe Mitral Regurgitation Chronic Diastolic CHF Patient was admitted in 09/2022 with severe sepsis secondary to multifocal pneumonia and bacterial meningitis. He was also found to have endocarditis of mitral valve with severe MR and a stroke secondary to the endocarditis. He stayed in the hospital for treatment with IV antibiotics. R/LHC on  8/5 showed essential normal coronaries with only minimal non-obstructive CAD with and pulmonary hypertension with mildly elevated left and right sided filling pressures (LVEDP 20 mmHg, RA pressure 11 mmHg, PWP 21 mmHg, PA pressure 64/22 with a mean of 41 mmHg, PVR 3.2 Woods units). Repeat TEE on 8/7 showed LVEF of 60-65% with a flail anterior mitral leaflet and severe mitral regurgitation but no mention of persistent vegetation. CT surgery recommended outpatient evaluation for mitral valve surgery after dental evaluation but it looks like he no-showed the follow-up appointment in 11/2022. He was seen by Infectious Disease on 12/16/2022 and reported dyspnea on exertion and facial swelling. He was started on Lasix due to concern for CHF. - Dyspnea and edema have resolved with Lasix. Weight is also down 9lbs since starting this. Appears euvolemic on exam.  - Will recheck BNP and BMET.  - Will repeat limited Echo to reassess LV function given CHF symptoms.   - Continue Lasix 40mg  daily.  - Discussed importance of daily weights and sodium restrictions.  - Advised patient to call and rescheduled visit with CT surgery. He also need to be seen by Oral Surgeon. PCP placed a referral for this at hospital follow-up visit in 10/2022 but patient has not heard from them. Asked patient to call and follow-up with PCP on this.   Hypotension Recent hospitalization was complicated by hypotension requiring Midodrine. - He notes some occasional mild lightheadedness/ dizziness with position changes but no significant symptoms.  - BP soft but stable.  - Continue Midodrine 5mg  three times daily.   Disposition: Follow up in 3 months.    Signed, Corrin Parker, PA-C  12/27/2022 3:55 PM    [facility]

## 2022-12-27 ENCOUNTER — Ambulatory Visit: Payer: Medicaid Other | Attending: Student | Admitting: Student

## 2022-12-27 ENCOUNTER — Encounter: Payer: Self-pay | Admitting: Student

## 2022-12-27 VITALS — BP 102/77 | HR 95 | Ht 64.0 in | Wt 118.0 lb

## 2022-12-27 DIAGNOSIS — I340 Nonrheumatic mitral (valve) insufficiency: Secondary | ICD-10-CM

## 2022-12-27 DIAGNOSIS — I959 Hypotension, unspecified: Secondary | ICD-10-CM

## 2022-12-27 DIAGNOSIS — I330 Acute and subacute infective endocarditis: Secondary | ICD-10-CM | POA: Diagnosis not present

## 2022-12-27 DIAGNOSIS — I5032 Chronic diastolic (congestive) heart failure: Secondary | ICD-10-CM

## 2022-12-27 NOTE — Patient Instructions (Addendum)
Medication Instructions:  The current medical regimen is effective;  continue present plan and medications as directed. Please refer to the Current Medication list given to you today.  *If you need a refill on your cardiac medications before your next appointment, please call your pharmacy*  Lab Work: BNP AND BMET TODAY If you have labs (blood work) drawn today and your tests are completely normal, you will receive your results only by:   MyChart Message (if you have MyChart) OR  A paper copy in the mail If you have any lab test that is abnormal or we need to change your treatment, we will call you to review the results.  Testing/Procedures: Your physician has requested that you have an echocardiogram. Echocardiography is a painless test that uses sound waves to create images of your heart. It provides your doctor with information about the size and shape of your heart and how well your heart's chambers and valves are working. This procedure takes approximately one hour. There are no restrictions for this procedure. Please do NOT wear cologne, perfume, aftershave, or lotions (deodorant is allowed). Please arrive 15 minutes prior to your appointment time.   Other Instructions DISCUSS DENTAL EVALUATION WITH YOUR PRIMARY CARE  Follow-Up: At [facility], you and your health needs are our priority.  As part of our continuing mission to provide you with exceptional heart care, we have created designated Provider Care Teams.  These Care Teams include your primary Cardiologist (physician) and Advanced Practice Providers (APPs -  Physician Assistants and Nurse Practitioners) who all work together to provide you with the care you need, when you need it.  We recommend signing up for the patient portal called "MyChart".  Sign up information is provided on this After Visit Summary.  MyChart is used to connect with patients for Virtual Visits (Telemedicine).  Patients are able to view lab/test  results, encounter notes, upcoming appointments, etc.  Non-urgent messages can be sent to your provider as well.   To learn more about what you can do with MyChart, go to [email or web address].    Your next appointment:   3 month(s)  -AND- FOLLOW UP WITH DR Kandace Blitz SURGERY  Provider:    Chilton Si, MD or Gillian Shields, NP Marjie Skiff, PA-C

## 2022-12-28 LAB — BASIC METABOLIC PANEL
BUN/Creatinine Ratio: 14 (ref 10–24)
BUN: 11 mg/dL (ref 8–27)
CO2: 26 mmol/L (ref 20–29)
Calcium: 8.7 mg/dL (ref 8.6–10.2)
Chloride: 98 mmol/L (ref 96–106)
Creatinine, Ser: 0.77 mg/dL (ref 0.76–1.27)
Glucose: 79 mg/dL (ref 70–99)
Potassium: 3.3 mmol/L — ABNORMAL LOW (ref 3.5–5.2)
Sodium: 139 mmol/L (ref 134–144)
eGFR: 101 mL/min/{1.73_m2} (ref >59)

## 2022-12-28 LAB — BRAIN NATRIURETIC PEPTIDE: BNP: 682.4 pg/mL — ABNORMAL HIGH (ref 0.0–100.0)

## 2022-12-30 ENCOUNTER — Other Ambulatory Visit: Payer: Self-pay

## 2022-12-30 ENCOUNTER — Other Ambulatory Visit (HOSPITAL_COMMUNITY): Payer: Self-pay

## 2022-12-30 MED ORDER — POTASSIUM CHLORIDE CRYS ER 20 MEQ PO TBCR
EXTENDED_RELEASE_TABLET | ORAL | 3 refills | Status: DC
  Filled 2022-12-30: qty 31, 30d supply, fill #0

## 2023-01-17 ENCOUNTER — Ambulatory Visit: Payer: Self-pay | Admitting: Internal Medicine

## 2023-01-18 ENCOUNTER — Encounter: Payer: Self-pay | Admitting: Neurology

## 2023-01-18 ENCOUNTER — Ambulatory Visit: Payer: Medicaid Other | Admitting: Neurology

## 2023-01-18 VITALS — BP 116/85 | HR 96 | Ht 65.0 in | Wt 118.0 lb

## 2023-01-18 DIAGNOSIS — I339 Acute and subacute endocarditis, unspecified: Secondary | ICD-10-CM | POA: Diagnosis not present

## 2023-01-18 DIAGNOSIS — I6310 Cerebral infarction due to embolism of unspecified precerebral artery: Secondary | ICD-10-CM | POA: Diagnosis not present
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
## 2023-01-18 NOTE — Patient Instructions (Signed)
I had a long discussion with the patient and his wife regarding his recent bilateral embolic strokes from bacterial endocarditis and answered questions.  Patient is doing quite well with practically no residual physical deficits from his stroke.  I recommend he continue his course of antibiotics as per infectious disease recommendations.  Also follow-up with cardiology for his CHF and shortness of breath as well as keep his upcoming appointment with cardiothoracic surgery to discuss mitral valve replacement.  Maintain aggressive risk factor modification with strict control of hypertension with blood pressure goal below 130/90, lipids with LDL cholesterol goal below 70 mg percent and diabetes with hemoglobin A1c goal below 6.5%.  Return for follow-up in the future with my nurse practitioner in 6 months or call earlier if necessary.

## 2023-01-19 ENCOUNTER — Ambulatory Visit (HOSPITAL_COMMUNITY): Payer: Medicaid Other | Attending: Cardiology

## 2023-01-19 DIAGNOSIS — I340 Nonrheumatic mitral (valve) insufficiency: Secondary | ICD-10-CM | POA: Diagnosis present

## 2023-01-19 DIAGNOSIS — I5032 Chronic diastolic (congestive) heart failure: Secondary | ICD-10-CM | POA: Diagnosis not present

## 2023-01-19 LAB — ECHOCARDIOGRAM LIMITED
Area-P 1/2: 4.23 cm2
MV M vel: 5.92 m/s
MV Peak grad: 139.9 mmHg
MV VTI: 0.89 cm2
S' Lateral: 3.05 cm

## 2023-01-30 ENCOUNTER — Ambulatory Visit (INDEPENDENT_AMBULATORY_CARE_PROVIDER_SITE_OTHER): Payer: Medicaid Other | Admitting: Nurse Practitioner

## 2023-01-30 ENCOUNTER — Encounter: Payer: Self-pay | Admitting: Nurse Practitioner

## 2023-01-30 VITALS — BP 108/84 | HR 94 | Temp 97.0°F | Wt 127.6 lb

## 2023-01-30 DIAGNOSIS — I5032 Chronic diastolic (congestive) heart failure: Secondary | ICD-10-CM | POA: Diagnosis not present

## 2023-01-30 DIAGNOSIS — R0602 Shortness of breath: Secondary | ICD-10-CM | POA: Insufficient documentation

## 2023-01-30 DIAGNOSIS — F17200 Nicotine dependence, unspecified, uncomplicated: Secondary | ICD-10-CM

## 2023-01-30 DIAGNOSIS — B20 Human immunodeficiency virus [HIV] disease: Secondary | ICD-10-CM

## 2023-01-30 DIAGNOSIS — I959 Hypotension, unspecified: Secondary | ICD-10-CM

## 2023-01-30 DIAGNOSIS — I639 Cerebral infarction, unspecified: Secondary | ICD-10-CM

## 2023-01-30 DIAGNOSIS — Z8673 Personal history of transient ischemic attack (TIA), and cerebral infarction without residual deficits: Secondary | ICD-10-CM | POA: Insufficient documentation

## 2023-01-30 DIAGNOSIS — Z0120 Encounter for dental examination and cleaning without abnormal findings: Secondary | ICD-10-CM | POA: Diagnosis not present

## 2023-01-30 DIAGNOSIS — I340 Nonrheumatic mitral (valve) insufficiency: Secondary | ICD-10-CM | POA: Diagnosis not present

## 2023-01-30 DIAGNOSIS — I50812 Chronic right heart failure: Secondary | ICD-10-CM | POA: Insufficient documentation

## 2023-01-30 MED ORDER — POTASSIUM CHLORIDE CRYS ER 20 MEQ PO TBCR
20.0000 meq | EXTENDED_RELEASE_TABLET | Freq: Every day | ORAL | 1 refills | Status: DC
Start: 2023-01-30 — End: 2023-08-07

## 2023-01-30 MED ORDER — ALBUTEROL SULFATE HFA 108 (90 BASE) MCG/ACT IN AERS: 2.0000 | INHALATION_SPRAY | Freq: Four times a day (QID) | RESPIRATORY_TRACT | 2 refills | Status: AC | PRN

## 2023-01-30 MED ORDER — MIDODRINE HCL 5 MG PO TABS
5.0000 mg | ORAL_TABLET | Freq: Three times a day (TID) | ORAL | 3 refills | Status: DC
Start: 2023-01-30 — End: 2023-05-30

## 2023-01-30 MED ORDER — FUROSEMIDE 40 MG PO TABS
40.0000 mg | ORAL_TABLET | Freq: Every day | ORAL | 1 refills | Status: DC
Start: 2023-01-30 — End: 2023-02-13
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
## 2023-01-30 NOTE — Patient Instructions (Signed)
Please consider getting shingles vaccine, Influenza and COVID  at local pharmacy.    1. Tobacco use disorder  - albuterol (VENTOLIN HFA) 108 (90 Base) MCG/ACT inhaler; Inhale 2 puffs into the lungs every 6 (six) hours as needed for wheezing or shortness of breath.  Dispense: 8 g; Refill: 2  2. Severe mitral regurgitation  - Ambulatory referral to Oral Maxillofacial Surgery  3. Need for assessment by dentistry for poor dentition  - Ambulatory referral to Oral Maxillofacial Surgery  4. Chronic diastolic CHF (congestive heart failure) (HCC)  - furosemide (LASIX) 40 MG tablet; Take 1 tablet (40 mg total) by mouth daily.  Dispense: 90 tablet; Refill: 1 - potassium chloride SA (KLOR-CON M20) 20 MEQ tablet; Take 1 tablet (20 mEq total) by mouth daily. Take 2 tablets (for total of 40 mEq) day 1 and then can start 1 tablet (20 mEq) daily thereafter.  Dispense: 90 tablet; Refill: 1 - Basic metabolic panel  5. Hypotension, unspecified hypotension type  - midodrine (PROAMATINE) 5 MG tablet; Take 1 tablet (5 mg total) by mouth 3 (three) times daily with meals.  Dispense: 90 tablet; Refill: 3  6. SOB (shortness of breath)  - albuterol (VENTOLIN HFA) 108 (90 Base) MCG/ACT inhaler; Inhale 2 puffs into the lungs every 6 (six) hours as needed for wheezing or shortness of breath.  Dispense: 8 g; Refill: 2   It is important that you exercise regularly at least 30 minutes 5 times a week as tolerated  Think about what you will eat, plan ahead. Choose " clean, green, fresh or frozen" over canned, processed or packaged foods which are more sugary, salty and fatty. 70 to 75% of food eaten should be vegetables and fruit. Three meals at set times with snacks allowed between meals, but they must be fruit or vegetables. Aim to eat over a 12 hour period , example 7 am to 7 pm, and STOP after  your last meal of the day. Drink water,generally about 64 ounces per day, no other drink is as healthy. Fruit juice is  best enjoyed in a healthy way, by EATING the fruit.  Thanks for choosing [facility] we consider it a privelige to serve you.

## 2023-01-30 NOTE — Assessment & Plan Note (Signed)
Albuterol inhaler 2 puffs every 6 hours as needed reordered Smoking cessation encouraged

## 2023-01-30 NOTE — Assessment & Plan Note (Signed)
BP Readings from Last 3 Encounters:  01/30/23 108/84  01/18/23 116/85  12/27/22 102/77  Blood pressure is stable Continue midodrine 5 mg 3 times daily

## 2023-01-30 NOTE — Assessment & Plan Note (Signed)
Needs MVR pending dental extraction Patient was previously referred to the oral surgeon but due to lack of insurance the referral was declined Patient now has Medicaid referral resent today Encouraged to maintain close follow-up with cardiology

## 2023-01-30 NOTE — Assessment & Plan Note (Signed)
  Needs oral tooth extraction prior to possible MVR repair Oral surgery referral placed today

## 2023-01-30 NOTE — Assessment & Plan Note (Signed)
Continue furosemide 40 mg daily, take with potassium 20 mEq daily Encouraged to monitor blood pressure at home, DASH diet advised Engage in regular moderate exercises as tolerated Patient encouraged to maintain close follow-up with cardiology

## 2023-01-30 NOTE — Assessment & Plan Note (Signed)
Continue Biktarvy 1 tablets daily Patient encouraged to maintain close follow-up with infectious disease specialist

## 2023-01-30 NOTE — Assessment & Plan Note (Addendum)
Lab Results  Component Value Date   CHOL 58 09/07/2022   HDL <10 (L) 09/07/2022   LDLCALC NOT CALCULATED 09/07/2022   LDLDIRECT 39 10/26/2022   TRIG 122 09/07/2022   CHOLHDL NOT CALCULATED 09/07/2022  Not on a statin, LDL goal is less than 55 Blood pressure goal is less than 130/80, A1c less than 6.5 He was recently evaluated by neurology, they recommended maintaining aggressive risk factor modification as above.

## 2023-01-30 NOTE — Assessment & Plan Note (Addendum)
Smokes about 1-2 cigarettes /day  Asked about quitting: confirms that he currently smokes cigarettes Advise to quit smoking: Educated about QUITTING to reduce the risk of cancer, cardio and cerebrovascular disease. Assess willingness: willing to quit at this time, and is working on cutting back. Assist with counseling and pharmacotherapy: Counseled for 5 minutes and literature provided. Arrange for follow up: follow up in 4 months and continue to offer help.

## 2023-01-31 LAB — BASIC METABOLIC PANEL
BUN/Creatinine Ratio: 22 (ref 10–24)
BUN: 20 mg/dL (ref 8–27)
CO2: 20 mmol/L (ref 20–29)
Calcium: 8.6 mg/dL (ref 8.6–10.2)
Chloride: 108 mmol/L — ABNORMAL HIGH (ref 96–106)
Creatinine, Ser: 0.93 mg/dL (ref 0.76–1.27)
Glucose: 94 mg/dL (ref 70–99)
Potassium: 4.2 mmol/L (ref 3.5–5.2)
Sodium: 143 mmol/L (ref 134–144)
eGFR: 93 mL/min/{1.73_m2} (ref >59)

## 2023-02-01 ENCOUNTER — Ambulatory Visit: Payer: Medicaid Other | Admitting: Internal Medicine

## 2023-02-01 ENCOUNTER — Encounter: Payer: Self-pay | Admitting: Internal Medicine

## 2023-02-01 ENCOUNTER — Other Ambulatory Visit: Payer: Self-pay

## 2023-02-01 VITALS — BP 123/93 | HR 99 | Temp 97.7°F | Ht 66.0 in | Wt 127.0 lb

## 2023-02-01 DIAGNOSIS — Z23 Encounter for immunization: Secondary | ICD-10-CM | POA: Diagnosis not present

## 2023-02-01 DIAGNOSIS — I509 Heart failure, unspecified: Secondary | ICD-10-CM

## 2023-02-01 DIAGNOSIS — B20 Human immunodeficiency virus [HIV] disease: Secondary | ICD-10-CM

## 2023-02-01 DIAGNOSIS — Z21 Asymptomatic human immunodeficiency virus [HIV] infection status: Secondary | ICD-10-CM | POA: Diagnosis present

## 2023-02-01 DIAGNOSIS — Z87891 Personal history of nicotine dependence: Secondary | ICD-10-CM

## 2023-02-01 NOTE — Patient Instructions (Signed)
Please make sure you have these 2 follow up; it's most critical for you at this time Heart failure clinic with ms Kaitlin You need to see the heart surgeon as well   Labs today with me  See me in 4-6 weeks   Continue your biktarvy every day. Avoid taking biktarvy with potassium, calcium, iron, magnesium, or vitamins within 2 hours
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
## 2023-02-03 LAB — HIV-1 RNA QUANT-NO REFLEX-BLD
HIV 1 RNA Quant: 108 {copies}/mL — ABNORMAL HIGH
HIV-1 RNA Quant, Log: 2.03 {Log_copies}/mL — ABNORMAL HIGH

## 2023-02-07 ENCOUNTER — Other Ambulatory Visit (HOSPITAL_COMMUNITY): Payer: Self-pay

## 2023-02-07 DIAGNOSIS — I330 Acute and subacute infective endocarditis: Secondary | ICD-10-CM

## 2023-02-07 NOTE — Progress Notes (Signed)
Tried to call pt, call cannot be completed at this time. Called [facility] she states that pt should be called today or tomorrow.

## 2023-02-08 NOTE — Addendum Note (Signed)
Addended by: Alyson Ingles on: 02/08/2023 08:40 AM   Modules accepted: Orders

## 2023-02-09 ENCOUNTER — Other Ambulatory Visit (HOSPITAL_COMMUNITY): Payer: Self-pay

## 2023-02-09 DIAGNOSIS — I340 Nonrheumatic mitral (valve) insufficiency: Secondary | ICD-10-CM

## 2023-02-09 DIAGNOSIS — R931 Abnormal findings on diagnostic imaging of heart and coronary circulation: Secondary | ICD-10-CM

## 2023-02-09 DIAGNOSIS — Z8679 Personal history of other diseases of the circulatory system: Secondary | ICD-10-CM

## 2023-02-13 ENCOUNTER — Other Ambulatory Visit (HOSPITAL_COMMUNITY): Payer: Self-pay | Admitting: Nurse Practitioner

## 2023-02-13 ENCOUNTER — Ambulatory Visit (HOSPITAL_BASED_OUTPATIENT_CLINIC_OR_DEPARTMENT_OTHER): Payer: Medicaid Other | Admitting: Family

## 2023-02-13 ENCOUNTER — Encounter (HOSPITAL_BASED_OUTPATIENT_CLINIC_OR_DEPARTMENT_OTHER): Payer: Self-pay | Admitting: Family

## 2023-02-13 VITALS — BP 138/74 | HR 87 | Ht 65.0 in | Wt 126.3 lb

## 2023-02-13 DIAGNOSIS — I5033 Acute on chronic diastolic (congestive) heart failure: Secondary | ICD-10-CM

## 2023-02-13 DIAGNOSIS — Z8679 Personal history of other diseases of the circulatory system: Secondary | ICD-10-CM

## 2023-02-13 DIAGNOSIS — F32A Depression, unspecified: Secondary | ICD-10-CM

## 2023-02-13 DIAGNOSIS — I340 Nonrheumatic mitral (valve) insufficiency: Secondary | ICD-10-CM

## 2023-02-13 DIAGNOSIS — I959 Hypotension, unspecified: Secondary | ICD-10-CM

## 2023-02-13 DIAGNOSIS — I5032 Chronic diastolic (congestive) heart failure: Secondary | ICD-10-CM | POA: Diagnosis not present

## 2023-02-13 DIAGNOSIS — K047 Periapical abscess without sinus: Secondary | ICD-10-CM

## 2023-02-13 DIAGNOSIS — Z8673 Personal history of transient ischemic attack (TIA), and cerebral infarction without residual deficits: Secondary | ICD-10-CM

## 2023-02-13 DIAGNOSIS — I361 Nonrheumatic tricuspid (valve) insufficiency: Secondary | ICD-10-CM | POA: Diagnosis not present

## 2023-02-13 MED ORDER — FUROSEMIDE 40 MG PO TABS: 40.0000 mg | ORAL_TABLET | Freq: Every day | ORAL | 1 refills | Status: DC

## 2023-02-13 MED ORDER — ESCITALOPRAM OXALATE 20 MG PO TABS: 20.0000 mg | ORAL_TABLET | Freq: Every day | ORAL | 1 refills | Status: DC

## 2023-02-13 NOTE — Progress Notes (Signed)
Cardiology Office Note:  .   Date:  02/13/2023  ID:  Christopher Farrell, DOB 20-Oct-1960, MRN [id number] PCP: Donell Beers, FNP  [facility] Providers Cardiologist:  Chilton Si, MD    History of Present Illness: Christopher Farrell is a 62 y.o. 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  Admitted 7/1 - 10/17/2018/severe sepsis secondary to multifocal pneumonia and bacterial meningitis due to streptococcus pneumoniae after presenting with fever and cough.  Had not been taking his antiretrovirals for HIV.  Had seizure while in the ED.  Brain MRI with multifocal acute/subacute ischemia in bilateral cerebral hemispheres R?L in L cerebellar. TTE 09/06/22 LVEF 60-65%, solid appearing mass on atrial side of the mid/distal anterior leaflet of mitral valve.  TEE 09/12/2022 LVEF 65 to 70% with multilobular vegetation attached to anterior mitral valve leaflet that measured 1.7x0.85cm. There was associated pseudoaneurysm and perforation of anterior leaflet with severe MR. Treated with IV abx for several weeks. Repeat TTE 10/07/22 LVEF 60-65%, gr3dd, normal RV, partial flail of anterior leaflet of MV with severe MR, severealy elevate PASP. R/LHC 8/5/24with minimal non-obstructive disease and pulmonary hypertension with mildly elevated filling pressures. Repeat TEE 10/12/22 LVEF 60-65%, flail mV and severe MR but no persistent vegetation. Hospitalization complicated by hypotension treated with midodrine.   Seen by Dr. Renold Don of [facility] 12/16/22 with increased dyspnea, facial edema and Lasix started.   Last seen 12/27/22 with improvement in dyspnea, facial swelling with Lasix. Occasional lightheadedness with position changes, Midodrine continued. Repeat echo performed 01/2023 normal LVEF 60-65%, RVSF moderately reduced, elevated PASP, moderate to severe TR, severe 4+  torrential MR.   Presents today for follow up with his mom. Reports no chest pain. Does note dypsnea at rest or with activity ongoing x 2 weeks. Productive cough with clear phlegm for 2 weeks. No orthopnea, PND. Does not have BP cuff nor scale at home for monitoring of fluid status. Occasional lightheadedness with position changes, no near syncope nor syncope. Taking Midodrine TID as prescribed.    ROS: Please see the history of present illness.    All other systems reviewed and are negative.   Studies Reviewed: .        Cardiac Studies & Procedures   CARDIAC CATHETERIZATION  CARDIAC CATHETERIZATION 10/10/2022  Narrative 1.  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 2.  Fick cardiac output of 6.2 L/min and Fick cardiac index of 3.9 L/min/m with the following hemodynamics: Right atrial pressure mean of 11 mmHg RV pressure 62/3 with an end-diastolic pressure of 14 mmHg Wedge pressure mean of 21 mmHg with V waves to 31 mmHg PA pressure 64/22 with a mean of 41 mmHg PVR 3.2 Woods units 3.  LVEDP of 20 mmHg  Recommendation: Continue evaluation for surgical mitral valve intervention.  Findings Coronary Findings Diagnostic  Dominance: Right  Left Anterior Descending The vessel exhibits minimal luminal irregularities.  Right Coronary Artery The vessel exhibits minimal luminal irregularities.  Intervention  No interventions have been documented.     ECHOCARDIOGRAM  ECHOCARDIOGRAM LIMITED 01/19/2023  Narrative ECHOCARDIOGRAM LIMITED REPORT    Patient Name:   Christopher Farrell Date of Exam: 01/19/2023 Medical Rec #:  [id number]          Height:       65.0 in Accession #:    [id number]  Weight:       118.0 lb Date of Birth:  1960-04-21          BSA:          1.581 m Patient Age:    62 years           BP:           116/85 mmHg Patient Gender: M                  HR:           88 bpm. Exam Location:  [address]  Procedure: Limited Echo,  3D Echo, Strain Analysis, Limited Color Doppler and Cardiac Doppler  Indications:    Congestive Heart Failure I50.9 Severe mitral regurgitation [[id number]]  History:        Patient has prior history of Echocardiogram examinations, most recent 10/12/2022. Pulmonary HTN, Severe MR; Signs/Symptoms:Hypotension.  Sonographer:    Eulah Pont RDCS Referring Phys: [id number] Corrin Parker   Sonographer Comments: Global longitudinal strain was attempted. IMPRESSIONS   1. Left ventricular ejection fraction, by estimation, is 60 to 65%. The left ventricle has normal function. The left ventricle has no regional wall motion abnormalities. GLS - 16.6%. 2. Right ventricular systolic function is atleast moderately reduced. The right ventricular size is moderately enlarged. There is severely elevated pulmonary artery systolic pressure of . 3. The mitral valve is abnormal. There is torrential 4+ mitral regurgitation secondary to prolapse/flail anterior mitral valve leaflet. 4. Tricuspid valve regurgitation is moderate to severe with signs of hepatic vein flow reversal. 5. The aortic valve is normal in structure. Aortic valve regurgitation is not visualized. No aortic stenosis is present. 6. The inferior vena cava is normal in size with <50% respiratory variability, suggesting right atrial pressure of 8 mmHg.  FINDINGS Left Ventricle: Left ventricular ejection fraction, by estimation, is 60 to 65%. The left ventricle has normal function. The left ventricle has no regional wall motion abnormalities. The left ventricular internal cavity size was normal in size. There is no left ventricular hypertrophy.  Right Ventricle: The right ventricular size is moderately enlarged. No increase in right ventricular wall thickness. Right ventricular systolic function is moderately reduced. There is severely elevated pulmonary artery systolic pressure. The tricuspid regurgitant velocity is 3.86 m/s, and with an  assumed right atrial pressure of 8 mmHg, the estimated right ventricular systolic pressure is 67.6 mmHg.  Left Atrium: Left atrial size was normal in size.  Right Atrium: Right atrial size was normal in size.  Pericardium: There is no evidence of pericardial effusion.  Mitral Valve: The mitral valve is abnormal. Severe mitral valve regurgitation. No evidence of mitral valve stenosis. MV peak gradient, 11.7 mmHg. The mean mitral valve gradient is 4.0 mmHg.  Tricuspid Valve: The tricuspid valve is normal in structure. Tricuspid valve regurgitation is moderate to severe. No evidence of tricuspid stenosis. There is prolapse of the tricuspid anterior leaflet.  Aortic Valve: The aortic valve is normal in structure. Aortic valve regurgitation is not visualized. No aortic stenosis is present.  Pulmonic Valve: The pulmonic valve was normal in structure. Pulmonic valve regurgitation is mild. No evidence of pulmonic stenosis.  Aorta: The aortic root is normal in size and structure.  Venous: The inferior vena cava is normal in size with less than 50% respiratory variability, suggesting right atrial pressure of 8 mmHg.  IAS/Shunts: No atrial level shunt detected by color flow Doppler.  Additional Comments: Spectral Doppler performed. Color Doppler performed.  LEFT  VENTRICLE PLAX 2D LVIDd:         5.25 cm   Diastology LVIDs:         3.05 cm   LV e' medial:    5.08 cm/s LV PW:         0.88 cm   LV E/e' medial:  25.8 LV IVS:        0.83 cm   LV e' lateral:   10.10 cm/s LVOT diam:     1.90 cm   LV E/e' lateral: 13.0 LV SV:         26 LV SV Index:   17 LVOT Area:     2.84 cm  3D Volume EF: 3D EF:        59 % LV EDV:       147 ml LV ESV:       61 ml LV SV:        87 ml  RIGHT VENTRICLE             IVC RV S prime:     10.10 cm/s  IVC diam: 1.97 cm TAPSE (M-mode): 1.5 cm  LEFT ATRIUM         Index LA diam:    4.60 cm 2.91 cm/m AORTIC VALVE LVOT Vmax:   65.80 cm/s LVOT Vmean:  40.100  cm/s LVOT VTI:    0.093 m  AORTA Ao Root diam: 2.90 cm  MITRAL VALVE                TRICUSPID VALVE MV Area (PHT): 4.23 cm     TR Peak grad:   59.6 mmHg MV Area VTI:   0.89 cm     TR Vmax:        386.00 cm/s MV Peak grad:  11.7 mmHg MV Mean grad:  4.0 mmHg     SHUNTS MV Vmax:       1.71 m/s     Systemic VTI:  0.09 m MV Vmean:      85.1 cm/s    Systemic Diam: 1.90 cm MR Peak grad: 139.9 mmHg MR Mean grad: 81.0 mmHg MR Vmax:      591.50 cm/s MR Vmean:     421.0 cm/s MV E velocity: 131.00 cm/s MV A velocity: 50.90 cm/s MV E/A ratio:  2.57  Aditya Sabharwal Electronically signed by Dorthula Nettles Signature Date/Time: 01/19/2023/12:08:50 PM    Final   TEE  ECHO TEE 10/12/2022  Narrative TRANSESOPHOGEAL ECHO REPORT    Patient Name:   CORDIE MAROTZ Date of Exam: 10/12/2022 Medical Rec #:  [id number]          Height:       64.0 in Accession #:    [id number]         Weight:       125.7 lb Date of Birth:  Sep 27, 1960          BSA:          1.606 m Patient Age:    61 years           BP:           90/70 mmHg Patient Gender: M                  HR:           91 bpm. Exam Location:  Inpatient  Procedure: Transesophageal Echo, 3D Echo, Cardiac Doppler and Color Doppler  Indications:    I34.1 Nonrheumatic mitral (valve) prolapse  History:        Patient has prior history of Echocardiogram examinations. Stroke, Mitral Valve Disease and Endocarditis; Risk Factors:Current Smoker.  Sonographer:    Dondra Prader RVT RCS Sonographer#2:  Darlys Gales Referring Phys: [id number] Jonita Albee  PROCEDURE: After discussion of the risks and benefits of a TEE, an informed consent was obtained from the patient. The transesophogeal probe was passed without difficulty through the esophogus of the patient. Sedation performed by different physician. The patient's vital signs; including heart rate, blood pressure, and oxygen saturation; remained stable throughout the procedure. The patient  developed no complications during the procedure.  IMPRESSIONS   1. Left ventricular ejection fraction, by estimation, is 60 to 65%. The left ventricle has normal function. The left ventricle has no regional wall motion abnormalities. 2. Right ventricular systolic function is normal. The right ventricular size is normal. 3. No left atrial/left atrial appendage thrombus was detected. 4. The anterior mitral leaflet is flail. The mitral valve is abnormal. Severe mitral valve regurgitation. No evidence of mitral stenosis. 5. Tricuspid valve regurgitation is moderate. 6. The aortic valve is normal in structure. Aortic valve regurgitation is not visualized. No aortic stenosis is present. 7. The inferior vena cava is normal in size with greater than 50% respiratory variability, suggesting right atrial pressure of 3 mmHg. 8. Agitated saline contrast bubble study was negative, with no evidence of any interatrial shunt.  Conclusion(s)/Recommendation(s): Normal biventricular function without evidence of hemodynamically significant valvular heart disease.  FINDINGS Left Ventricle: Left ventricular ejection fraction, by estimation, is 60 to 65%. The left ventricle has normal function. The left ventricle has no regional wall motion abnormalities. The left ventricular internal cavity size was normal in size. There is no left ventricular hypertrophy.  Right Ventricle: The right ventricular size is normal. No increase in right ventricular wall thickness. Right ventricular systolic function is normal.  Left Atrium: Left atrial size was normal in size. No left atrial/left atrial appendage thrombus was detected.  Right Atrium: Right atrial size was normal in size.  Pericardium: There is no evidence of pericardial effusion.  Mitral Valve: The anterior mitral leaflet is flail. The mitral valve is abnormal. Severe mitral valve regurgitation, with posteriorly-directed jet. No evidence of mitral valve  stenosis.  Tricuspid Valve: The tricuspid valve is normal in structure. Tricuspid valve regurgitation is moderate . No evidence of tricuspid stenosis.  Aortic Valve: The aortic valve is normal in structure. Aortic valve regurgitation is not visualized. No aortic stenosis is present.  Pulmonic Valve: The pulmonic valve was normal in structure. Pulmonic valve regurgitation is not visualized. No evidence of pulmonic stenosis.  Aorta: The aortic root is normal in size and structure. There is minimal (Grade I) layered plaque involving the descending aorta.  Venous: The right upper pulmonary vein, right lower pulmonary vein, left upper pulmonary vein and left lower pulmonary vein are normal. A pattern of systolic flow reversal, suggestive of severe mitral regurgitation is recorded from the left upper pulmonary vein and the right upper pulmonary vein. The inferior vena cava is normal in size with greater than 50% respiratory variability, suggesting right atrial pressure of 3 mmHg.  IAS/Shunts: No atrial level shunt detected by color flow Doppler. Agitated saline contrast was given intravenously to evaluate for intracardiac shunting. Agitated saline contrast bubble study was negative, with no evidence of any interatrial shunt.   MR Peak grad:    80.3 mmHg MR Mean grad:    65.0 mmHg MR Vmax:  448.00 cm/s MR Vmean:        396.0 cm/s MR PISA:         7.60 cm MR PISA Eff ROA: 59 mm MR PISA Radius:  1.10 cm  Kardie Tobb DO Electronically signed by Thomasene Ripple DO Signature Date/Time: 10/12/2022/3:18:34 PM    Final            Risk Assessment/Calculations:             Physical Exam:   VS:  BP 138/74   Pulse 87   Ht 5\' 5"  (1.651 m)   Wt 126 lb 4.8 oz (57.3 kg)   SpO2 91%   BMI 21.02 kg/m    Wt Readings from Last 3 Encounters:  02/13/23 126 lb 4.8 oz (57.3 kg)  02/01/23 127 lb (57.6 kg)  01/30/23 127 lb 9.6 oz (57.9 kg)    GEN: Well nourished, well developed in no acute  distress NECK: No JVD; No carotid bruits CARDIAC: RRR, no murmurs, rubs, gallops RESPIRATORY:  Bilateral LL with rhonchi. ABDOMEN: Soft, non-tender, non-distended EXTREMITIES:  No edema; No deformity   ASSESSMENT AND PLAN: .    MV endocarditis / Severe MR / Mod-severe TR / Right sided heart failure - rhonchi noted on exam. Increase Furosemide to 40mg  AM and 20mg  PM x 3 days then return to 40mg  daily. Unable to add BB/ACE/ARB/ARNI/MRA due to hypotension requiring midodrine. Low sodium diet, fluid restriction <2L, and daily weights encouraged. Educated to contact our office for weight gain of 2 lbs overnight or 5 lbs in one week.  Will require surgical repair of MV (possibly TV). Tricuspid regurgitation likely related to RV dilation and pulmonary hypertension. Has upcoming visit 03/16/23 with Dr. Leafy Ro.  Dental abscess by orthopantogram - Noted during hospitalization 09/2022. Dental work unable to completed while admitted. Has not seen dentist. Will reach out to TCTS and SW to assist coordinating.  Hx of CVA - Follows with Dr. Pearlean Brownie.   Anxiety/depression - Needs Zoloft refills. Will make PCP aware.  Hypotension -continue midodrine 5 mg 3 times daily.  BP cuff provided in clinic.  HIV - Per ID       Dispo: follow up in 2 months  Signed, Alver Sorrow, NP

## 2023-02-13 NOTE — Patient Instructions (Addendum)
Medication Instructions:  Your physician has recommended you make the following change in your medication:   For 3 days, take Furosemide 40mg  (one tablet) in the morning and 20mg  (half tablet) in the afternoon.  After 3 days, return to Furosemide 40mg  (one tablet) daily.  *If you need a refill on your cardiac medications before your next appointment, please call your pharmacy*   Follow-Up: At [facility], you and your health needs are our priority.  As part of our continuing mission to provide you with exceptional heart care, we have created designated Provider Care Teams.  These Care Teams include your primary Cardiologist (physician) and Advanced Practice Providers (APPs -  Physician Assistants and Nurse Practitioners) who all work together to provide you with the care you need, when you need it.  We recommend signing up for the patient portal called "MyChart".  Sign up information is provided on this After Visit Summary.  MyChart is used to connect with patients for Virtual Visits (Telemedicine).  Patients are able to view lab/test results, encounter notes, upcoming appointments, etc.  Non-urgent messages can be sent to your provider as well.   To learn more about what you can do with MyChart, go to [email or web address].    Your next appointment:   2 month(s)  Provider:   Chilton Si, MD or Gillian Shields, NP    Other Instructions  Recommend weighing daily and keeping a log. Please call our office if you have weight gain of 2 pounds overnight or 5 pounds in 1 week.   Date  Time Weight                                             Heart Healthy Diet Recommendations: A low-salt diet is recommended. Meats should be grilled, baked, or boiled. Avoid fried foods. Focus on lean protein sources like fish or chicken with vegetables and fruits. The American Heart Association is a Chief Technology Officer!  American Heart Association Diet and Lifeystyle Recommendations    Exercise recommendations: The American Heart Association recommends 150 minutes of moderate intensity exercise weekly. Try 30 minutes of moderate intensity exercise 4-5 times per week. This could include walking, jogging, or swimming.

## 2023-02-13 NOTE — Progress Notes (Signed)
Letter mailed [employer]

## 2023-02-14 ENCOUNTER — Telehealth: Payer: Self-pay | Admitting: Licensed Clinical Social Worker

## 2023-02-14 NOTE — Telephone Encounter (Addendum)
H&V Care Navigation CSW Progress Note  Clinical Social Worker contacted patient by phone to f/u on dental resources. Reached him at [phone]. Introduced self, role, reason for call. Pt confirmed he is in need of dental work. Confirmed home address, PCP, and insurance is Medicaid at this time. We discussed that I can send a list of in network providers but he needs to call and schedule an appt with the office to ensure they are in network. Pt has also been referred by PCP to Oral Surgery- unclear who if anyone manages that referral queue. I encouraged pt to let me know once he has received the list of providers if any additional questions and we can assist if needed to have appt scheduled.  Patient is participating in a Managed Medicaid Plan:  Yes- [employer]  SDOH Screenings   Food Insecurity: No Food Insecurity (09/13/2022)  Housing: Patient Declined (09/13/2022)  Transportation Needs: No Transportation Needs (09/13/2022)  Utilities: Not At Risk (09/13/2022)  Depression (PHQ2-9): Low Risk  (02/01/2023)  Tobacco Use: High Risk (02/13/2023)    Octavio Graves, MSW, LCSW Clinical Social Worker II [facility] Heart/Vascular Care Navigation  [phone]- work cell phone (preferred) [phone]- desk phone

## 2023-02-16 NOTE — Progress Notes (Signed)
Pt was advised and he will get transportation to the pharmacy for pick up. KH

## 2023-03-02 ENCOUNTER — Other Ambulatory Visit: Payer: Self-pay | Admitting: Nurse Practitioner

## 2023-03-02 DIAGNOSIS — F419 Anxiety disorder, unspecified: Secondary | ICD-10-CM
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
## 2023-03-16 ENCOUNTER — Encounter: Payer: Medicaid Other | Admitting: Thoracic Surgery (Cardiothoracic Vascular Surgery)

## 2023-03-17 ENCOUNTER — Encounter: Payer: Self-pay | Admitting: Thoracic Surgery (Cardiothoracic Vascular Surgery)

## 2023-04-20 ENCOUNTER — Encounter: Payer: Medicaid Other | Admitting: Thoracic Surgery (Cardiothoracic Vascular Surgery)

## 2023-04-20 ENCOUNTER — Telehealth (HOSPITAL_BASED_OUTPATIENT_CLINIC_OR_DEPARTMENT_OTHER): Payer: Self-pay

## 2023-04-20 DIAGNOSIS — I340 Nonrheumatic mitral (valve) insufficiency: Secondary | ICD-10-CM

## 2023-04-20 NOTE — Telephone Encounter (Addendum)
Per Gillian Shields, NP, schedule patient for echo. Will call 2/13

## 2023-04-20 NOTE — Telephone Encounter (Signed)
Called and spoke with patient, agreeable to having echo done, aware he will be getting a call to schedule. Routing to scheduling team!

## 2023-04-20 NOTE — Addendum Note (Signed)
Addended by: Marlene Lard on: 04/20/2023 09:04 AM   Modules accepted: Orders

## 2023-05-02 ENCOUNTER — Ambulatory Visit (HOSPITAL_BASED_OUTPATIENT_CLINIC_OR_DEPARTMENT_OTHER): Payer: Medicaid Other | Admitting: Family

## 2023-05-02 ENCOUNTER — Encounter (HOSPITAL_BASED_OUTPATIENT_CLINIC_OR_DEPARTMENT_OTHER): Payer: Self-pay

## 2023-05-02 ENCOUNTER — Encounter (HOSPITAL_BASED_OUTPATIENT_CLINIC_OR_DEPARTMENT_OTHER): Payer: Self-pay | Admitting: Family

## 2023-05-02 VITALS — BP 94/60 | HR 91 | Ht 65.0 in | Wt 127.3 lb

## 2023-05-02 DIAGNOSIS — I5032 Chronic diastolic (congestive) heart failure: Secondary | ICD-10-CM | POA: Diagnosis not present

## 2023-05-02 DIAGNOSIS — Z8673 Personal history of transient ischemic attack (TIA), and cerebral infarction without residual deficits: Secondary | ICD-10-CM

## 2023-05-02 DIAGNOSIS — I959 Hypotension, unspecified: Secondary | ICD-10-CM | POA: Diagnosis not present

## 2023-05-02 DIAGNOSIS — I50812 Chronic right heart failure: Secondary | ICD-10-CM | POA: Diagnosis not present

## 2023-05-02 DIAGNOSIS — K047 Periapical abscess without sinus: Secondary | ICD-10-CM | POA: Diagnosis not present

## 2023-05-02 DIAGNOSIS — I340 Nonrheumatic mitral (valve) insufficiency: Secondary | ICD-10-CM

## 2023-05-02 DIAGNOSIS — I361 Nonrheumatic tricuspid (valve) insufficiency: Secondary | ICD-10-CM

## 2023-05-02 DIAGNOSIS — Z21 Asymptomatic human immunodeficiency virus [HIV] infection status: Secondary | ICD-10-CM

## 2023-05-02 NOTE — Progress Notes (Signed)
 Cardiology Office Note:  .   Date:  05/02/2023  ID:  Christopher Farrell, DOB 1961-03-06, MRN [id number] PCP: Christopher Beers, FNP   HeartCare Providers Cardiologist:  Christopher Si, MD    History of Present Illness: Christopher Farrell is a 63 y.o. 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  Admitted 7/1 - 10/17/2022 severe sepsis secondary to multifocal pneumonia and bacterial meningitis due to streptococcus pneumoniae after presenting with fever and cough.  Had not been taking his antiretrovirals for HIV.  Had seizure while in the ED.  Brain MRI with multifocal acute/subacute ischemia in bilateral cerebral hemispheres (R>L) in L cerebellar. TTE 09/06/22 LVEF 60-65%, solid appearing mass on atrial side of the mid/distal anterior leaflet of mitral valve.  TEE 09/12/2022 LVEF 65 to 70% with multilobular vegetation attached to anterior mitral valve leaflet that measured 1.7x0.85cm. There was associated pseudoaneurysm and perforation of anterior leaflet with severe MR. Treated with IV abx for several weeks. Repeat TTE 10/07/22 LVEF 60-65%, gr3dd, normal RV, partial flail of anterior leaflet of MV with severe MR, severealy elevate PASP. R/LHC 8/5/24with minimal non-obstructive disease and pulmonary hypertension with mildly elevated filling pressures. Repeat TEE 10/12/22 LVEF 60-65%, flail MV and severe MR but no persistent vegetation. Hospitalization complicated by hypotension treated with midodrine.   Seen by Dr. Renold Farrell of [facility] 12/16/22 with increased dyspnea, facial edema and Lasix started.   Seen 12/27/22 with improvement in dyspnea, facial swelling with Lasix. Occasional lightheadedness with position changes, Midodrine continued. Repeat echo performed 01/2023 normal LVEF 60-65%, RVSF moderately reduced, elevated PASP, moderate to severe TR, severe 4+ torrential  MR.   Last seen 02/13/2023.  Due to rhonchi exam and congested cough recommend increase to 40 mg a.m. and 20 mg p.m. for 3 days then return to 40 mg daily.  Social work consulted for dental needs and he was mailed list of dental providers covered by Medicaid.  He was provided BP cuff for home monitoring.  Presents today for follow up with his mom.  Reports taking medications as prescribed.  Exertional dyspnea improved but still present.  Notes some orthopnea.  No edema, PN, palpitations, chest pain, lightheadedness, dizziness.  He is walking outside for exercise greater than 15 minutes at a time.  Missed his last visit with cardiothoracic surgery.  He is not certain if he received list of dental providers in the mail and has not had any dental work in many years.  ROS: Please see the history of present illness.    All other systems reviewed and are negative.   Studies Reviewed: .        DG Orthopantogram 10/08/22 FINDINGS: The mandible appears completely edentulous. Multiple upper teeth are missing. Dental amalgam is noted involving the right upper first and second molar. There is a large carie involving the right upper first Muller. Dental caries are noted involving the left upper cuspid, left upper first bicuspid, and left upper first molar. There is a periapical lucency involving the left upper cuspid.   IMPRESSION: Multiple dental caries. Periapical lucency involving the left upper cuspid may represent underlying abscess.   Cardiac Studies & Procedures   ______________________________________________________________________________________________ CARDIAC CATHETERIZATION  CARDIAC CATHETERIZATION 10/10/2022  Narrative 1.  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 2.  Fick cardiac output of 6.2 L/min and Fick cardiac index of 3.9 L/min/m with the following hemodynamics:  Right atrial pressure mean of 11 mmHg RV pressure 62/3 with an end-diastolic pressure  of 14 mmHg Wedge pressure mean of 21 mmHg with V waves to 31 mmHg PA pressure 64/22 with a mean of 41 mmHg PVR 3.2 Woods units 3.  LVEDP of 20 mmHg  Recommendation: Continue evaluation for surgical mitral valve intervention.  Findings Coronary Findings Diagnostic  Dominance: Right  Left Anterior Descending The vessel exhibits minimal luminal irregularities.  Right Coronary Artery The vessel exhibits minimal luminal irregularities.  Intervention  No interventions have been documented.     ECHOCARDIOGRAM  ECHOCARDIOGRAM LIMITED 01/19/2023  Narrative ECHOCARDIOGRAM LIMITED REPORT    Patient Name:   Christopher Farrell Date of Exam: 01/19/2023 Medical Rec #:  [id number]          Height:       65.0 in Accession #:    [id number]         Weight:       118.0 lb Date of Birth:  1960-08-10          BSA:          1.581 m Patient Age:    62 years           BP:           116/85 mmHg Patient Gender: M                  HR:           88 bpm. Exam Location:  [address]  Procedure: Limited Echo, 3D Echo, Strain Analysis, Limited Color Doppler and Cardiac Doppler  Indications:    Congestive Heart Failure I50.9 Severe mitral regurgitation [[id number]]  History:        Patient has prior history of Echocardiogram examinations, most recent 10/12/2022. Pulmonary HTN, Severe MR; Signs/Symptoms:Hypotension.  Sonographer:    Eulah Pont RDCS Referring Phys: [id number] Christopher Farrell   Sonographer Comments: Global longitudinal strain was attempted. IMPRESSIONS   1. Left ventricular ejection fraction, by estimation, is 60 to 65%. The left ventricle has normal function. The left ventricle has no regional wall motion abnormalities. GLS - 16.6%. 2. Right ventricular systolic function is atleast moderately reduced. The right ventricular size is moderately enlarged. There is severely elevated pulmonary artery systolic pressure of . 3. The mitral valve is abnormal. There is torrential  4+ mitral regurgitation secondary to prolapse/flail anterior mitral valve leaflet. 4. Tricuspid valve regurgitation is moderate to severe with signs of hepatic vein flow reversal. 5. The aortic valve is normal in structure. Aortic valve regurgitation is not visualized. No aortic stenosis is present. 6. The inferior vena cava is normal in size with <50% respiratory variability, suggesting right atrial pressure of 8 mmHg.  FINDINGS Left Ventricle: Left ventricular ejection fraction, by estimation, is 60 to 65%. The left ventricle has normal function. The left ventricle has no regional wall motion abnormalities. The left ventricular internal cavity size was normal in size. There is no left ventricular hypertrophy.  Right Ventricle: The right ventricular size is moderately enlarged. No increase in right ventricular wall thickness. Right ventricular systolic function is moderately reduced. There is severely elevated pulmonary artery systolic pressure. The tricuspid regurgitant velocity is 3.86 m/s, and with an assumed right atrial pressure of 8 mmHg, the estimated right ventricular systolic pressure is 67.6 mmHg.  Left Atrium: Left atrial size was normal in size.  Right Atrium: Right atrial size was normal in size.  Pericardium: There is no evidence of pericardial effusion.  Mitral Valve: The mitral valve is abnormal. Severe mitral valve regurgitation. No evidence of mitral valve stenosis. MV peak gradient, 11.7 mmHg. The mean mitral valve gradient is 4.0 mmHg.  Tricuspid Valve: The tricuspid valve is normal in structure. Tricuspid valve regurgitation is moderate to severe. No evidence of tricuspid stenosis. There is prolapse of the tricuspid anterior leaflet.  Aortic Valve: The aortic valve is normal in structure. Aortic valve regurgitation is not visualized. No aortic stenosis is present.  Pulmonic Valve: The pulmonic valve was normal in structure. Pulmonic valve regurgitation is mild. No  evidence of pulmonic stenosis.  Aorta: The aortic root is normal in size and structure.  Venous: The inferior vena cava is normal in size with less than 50% respiratory variability, suggesting right atrial pressure of 8 mmHg.  IAS/Shunts: No atrial level shunt detected by color flow Doppler.  Additional Comments: Spectral Doppler performed. Color Doppler performed.  LEFT VENTRICLE PLAX 2D LVIDd:         5.25 cm   Diastology LVIDs:         3.05 cm   LV e' medial:    5.08 cm/s LV PW:         0.88 cm   LV E/e' medial:  25.8 LV IVS:        0.83 cm   LV e' lateral:   10.10 cm/s LVOT diam:     1.90 cm   LV E/e' lateral: 13.0 LV SV:         26 LV SV Index:   17 LVOT Area:     2.84 cm  3D Volume EF: 3D EF:        59 % LV EDV:       147 ml LV ESV:       61 ml LV SV:        87 ml  RIGHT VENTRICLE             IVC RV S prime:     10.10 cm/s  IVC diam: 1.97 cm TAPSE (M-mode): 1.5 cm  LEFT ATRIUM         Index LA diam:    4.60 cm 2.91 cm/m AORTIC VALVE LVOT Vmax:   65.80 cm/s LVOT Vmean:  40.100 cm/s LVOT VTI:    0.093 m  AORTA Ao Root diam: 2.90 cm  MITRAL VALVE                TRICUSPID VALVE MV Area (PHT): 4.23 cm     TR Peak grad:   59.6 mmHg MV Area VTI:   0.89 cm     TR Vmax:        386.00 cm/s MV Peak grad:  11.7 mmHg MV Mean grad:  4.0 mmHg     SHUNTS MV Vmax:       1.71 m/s     Systemic VTI:  0.09 m MV Vmean:      85.1 cm/s    Systemic Diam: 1.90 cm MR Peak grad: 139.9 mmHg MR Mean grad: 81.0 mmHg MR Vmax:      591.50 cm/s MR Vmean:     421.0 cm/s MV E velocity: 131.00 cm/s MV A velocity: 50.90 cm/s MV E/A ratio:  2.57  Aditya Sabharwal Electronically signed by Dorthula Nettles Signature Date/Time: 01/19/2023/12:08:50 PM    Final   TEE  ECHO TEE 10/12/2022  Narrative TRANSESOPHOGEAL ECHO REPORT    Patient Name:   Christopher Farrell Date of Exam: 10/12/2022 Medical Rec #:  [id number]          Height:       64.0 in Accession #:    [id number]          Weight:       125.7 lb Date of Birth:  Jul 20, 1960          BSA:          1.606 m Patient Age:    61 years           BP:           90/70 mmHg Patient Gender: M                  HR:           91 bpm. Exam Location:  Inpatient  Procedure: Transesophageal Echo, 3D Echo, Cardiac Doppler and Color Doppler  Indications:    I34.1 Nonrheumatic mitral (valve) prolapse  History:        Patient has prior history of Echocardiogram examinations. Stroke, Mitral Valve Disease and Endocarditis; Risk Factors:Current Smoker.  Sonographer:    Dondra Prader RVT RCS Sonographer#2:  Darlys Gales Referring Phys: [id number] Jonita Albee  PROCEDURE: After discussion of the risks and benefits of a TEE, an informed consent was obtained from the patient. The transesophogeal probe was passed without difficulty through the esophogus of the patient. Sedation performed by different physician. The patient's vital signs; including heart rate, blood pressure, and oxygen saturation; remained stable throughout the procedure. The patient developed no complications during the procedure.  IMPRESSIONS   1. Left ventricular ejection fraction, by estimation, is 60 to 65%. The left ventricle has normal function. The left ventricle has no regional wall motion abnormalities. 2. Right ventricular systolic function is normal. The right ventricular size is normal. 3. No left atrial/left atrial appendage thrombus was detected. 4. The anterior mitral leaflet is flail. The mitral valve is abnormal. Severe mitral valve regurgitation. No evidence of mitral stenosis. 5. Tricuspid valve regurgitation is moderate. 6. The aortic valve is normal in structure. Aortic valve regurgitation is not visualized. No aortic stenosis is present. 7. The inferior vena cava is normal in size with greater than 50% respiratory variability, suggesting right atrial pressure of 3 mmHg. 8. Agitated saline contrast bubble study was negative, with no evidence of  any interatrial shunt.  Conclusion(s)/Recommendation(s): Normal biventricular function without evidence of hemodynamically significant valvular heart disease.  FINDINGS Left Ventricle: Left ventricular ejection fraction, by estimation, is 60 to 65%. The left ventricle has normal function. The left ventricle has no regional wall motion abnormalities. The left ventricular internal cavity size was normal in size. There is no left ventricular hypertrophy.  Right Ventricle: The right ventricular size is normal. No increase in right ventricular wall thickness. Right ventricular systolic function is normal.  Left Atrium: Left atrial size was normal in size. No left atrial/left atrial appendage thrombus was detected.  Right Atrium: Right atrial size was normal in size.  Pericardium: There is no evidence of pericardial effusion.  Mitral Valve: The anterior mitral leaflet is flail. The mitral valve is abnormal. Severe mitral valve regurgitation, with posteriorly-directed jet. No evidence of mitral valve stenosis.  Tricuspid Valve: The tricuspid valve is normal in structure. Tricuspid valve regurgitation is moderate . No evidence of tricuspid stenosis.  Aortic Valve: The aortic valve is normal in structure. Aortic valve regurgitation is not visualized. No aortic stenosis is present.  Pulmonic Valve: The pulmonic valve was normal in structure. Pulmonic valve regurgitation is  not visualized. No evidence of pulmonic stenosis.  Aorta: The aortic root is normal in size and structure. There is minimal (Grade I) layered plaque involving the descending aorta.  Venous: The right upper pulmonary vein, right lower pulmonary vein, left upper pulmonary vein and left lower pulmonary vein are normal. A pattern of systolic flow reversal, suggestive of severe mitral regurgitation is recorded from the left upper pulmonary vein and the right upper pulmonary vein. The inferior vena cava is normal in size with greater  than 50% respiratory variability, suggesting right atrial pressure of 3 mmHg.  IAS/Shunts: No atrial level shunt detected by color flow Doppler. Agitated saline contrast was given intravenously to evaluate for intracardiac shunting. Agitated saline contrast bubble study was negative, with no evidence of any interatrial shunt.   MR Peak grad:    80.3 mmHg MR Mean grad:    65.0 mmHg MR Vmax:         448.00 cm/s MR Vmean:        396.0 cm/s MR PISA:         7.60 cm MR PISA Eff ROA: 59 mm MR PISA Radius:  1.10 cm  Kardie Tobb DO Electronically signed by Thomasene Ripple DO Signature Date/Time: 10/12/2022/3:18:34 PM    Final        ______________________________________________________________________________________________         Risk Assessment/Calculations:             Physical Exam:   VS:  BP 94/60   Pulse 91   Ht 5\' 5"  (1.651 m)   Wt 127 lb 4.8 oz (57.7 kg)   SpO2 96%   BMI 21.18 kg/m    Wt Readings from Last 3 Encounters:  05/02/23 127 lb 4.8 oz (57.7 kg)  02/13/23 126 lb 4.8 oz (57.3 kg)  02/01/23 127 lb (57.6 kg)    GEN: Well nourished, well developed in no acute distress NECK: No JVD; No carotid bruits CARDIAC: RRR, no  rubs, gallops RESPIRATORY: Lungs clear on exam. Gr 3/6 holosystolic murmur ABDOMEN: Soft, non-tender, non-distended EXTREMITIES:  No edema; No deformity   ASSESSMENT AND PLAN: .    MV endocarditis / Severe MR / Mod-severe TR / Right sided heart failure - Euvolemic on exam.  Continue Lasix 40 mg daily.  Update BMP/BNP/CBC for monitoring.  Unable to add BB/ACE/ARB/ARNI/MRA due to hypotension requiring midodrine. Low sodium diet, fluid restriction <2L, and daily weights encouraged. Educated to contact our office for weight gain of 2 lbs overnight or 5 lbs in one week.  Will require surgical repair of MV (possibly TV). Tricuspid regurgitation likely related to RV dilation and pulmonary hypertension.  Updated limited echo to reassess MV/TV and LVEF  scheduled 05/16/23 Will reach out to [facility] to inquire about timing of follow up visit.  Dental abscess by orthopantogram - Noted during hospitalization 09/2022. Dental work unable to completed while admitted. Has not seen dentist. Provided him list of Medicaid dental providers in clinic. Advised he may have to call more than one to get an appointment due to his managed Medicaid. Will also place referral to Dr. Barbette Merino and Dr. Chales Salmon as will need dental work and resolution of dental abscess prior to cardiothoracic surgery  Hx of CVA - Follows with Dr. Pearlean Brownie.   Anxiety/depression - Needs Zoloft refills. Will make PCP aware.  Hypotension -continue midodrine 5 mg 3 times daily.  BP cuff previously provided in clinic.  HIV - Per [facility]       Dispo: follow up in  2-3 months  Signed, Alver Sorrow, NP

## 2023-05-02 NOTE — Patient Instructions (Signed)
 Medication Instructions:  Continue your current medications  *If you need a refill on your cardiac medications before your next appointment, please call your pharmacy*   Lab Work: Your physician recommends that you return for lab work today: BMP, BNP, CBC If you have labs (blood work) drawn today and your tests are completely normal, you will receive your results only by: MyChart Message (if you have MyChart) OR A paper copy in the mail If you have any lab test that is abnormal or we need to change your treatment, we will call you to review the results.   Follow-Up: At [facility], you and your health needs are our priority.  As part of our continuing mission to provide you with exceptional heart care, we have created designated Provider Care Teams.  These Care Teams include your primary Cardiologist (physician) and Advanced Practice Providers (APPs -  Physician Assistants and Nurse Practitioners) who all work together to provide you with the care you need, when you need it.  We recommend signing up for the patient portal called "MyChart".  Sign up information is provided on this After Visit Summary.  MyChart is used to connect with patients for Virtual Visits (Telemedicine).  Patients are able to view lab/test results, encounter notes, upcoming appointments, etc.  Non-urgent messages can be sent to your provider as well.   To learn more about what you can do with MyChart, go to [email or web address].    Your next appointment:   2-3 month(s)  Provider:   Chilton Si, MD or Gillian Shields, NP    Other Instructions       You have been provided a list of dental providers today. It is very important to call and get an appointment. You will likely have to call more than one office to get an appointment. Please tell the dentist, "I was admitted for endocarditis in July 2024 and need to have dental work completed prior to mitral valve replacement".  If you have called  multiple dental providers and still unable to get an appointment, please let us know.

## 2023-05-03 LAB — BASIC METABOLIC PANEL
BUN/Creatinine Ratio: 16 (ref 10–24)
BUN: 14 mg/dL (ref 8–27)
CO2: 23 mmol/L (ref 20–29)
Calcium: 9 mg/dL (ref 8.6–10.2)
Chloride: 106 mmol/L (ref 96–106)
Creatinine, Ser: 0.88 mg/dL (ref 0.76–1.27)
Glucose: 65 mg/dL — ABNORMAL LOW (ref 70–99)
Potassium: 3.8 mmol/L (ref 3.5–5.2)
Sodium: 144 mmol/L (ref 134–144)
eGFR: 97 mL/min/{1.73_m2} (ref >59)

## 2023-05-03 LAB — CBC
Hematocrit: 40.4 % (ref 37.5–51.0)
Hemoglobin: 13.4 g/dL (ref 13.0–17.7)
MCH: 31.7 pg (ref 26.6–33.0)
MCHC: 33.2 g/dL (ref 31.5–35.7)
MCV: 96 fL (ref 79–97)
Platelets: 148 10*3/uL — ABNORMAL LOW (ref 150–450)
RBC: 4.23 x10E6/uL (ref 4.14–5.80)
RDW: 13 % (ref 11.6–15.4)
WBC: 6.1 10*3/uL (ref 3.4–10.8)

## 2023-05-03 LAB — BRAIN NATRIURETIC PEPTIDE: BNP: 733.1 pg/mL — ABNORMAL HIGH (ref 0.0–100.0)

## 2023-05-04 ENCOUNTER — Telehealth (HOSPITAL_BASED_OUTPATIENT_CLINIC_OR_DEPARTMENT_OTHER): Payer: Self-pay

## 2023-05-04 NOTE — Telephone Encounter (Addendum)
 Results called to patient who verbalizes understanding!     ----- Message from Alver Sorrow sent at 05/04/2023 12:46 PM EST ----- Normal kidney function and electrolytes. CBC with no evidence of anemia nor infection.  Platelet count mildly low, not of concern. BNP shows volume overload slightly increased from prior.  Recommend Lasix 40mg  AM and 20mg  in the afternoon for 3 days then return to 40mg  daily.

## 2023-05-16 ENCOUNTER — Ambulatory Visit (HOSPITAL_BASED_OUTPATIENT_CLINIC_OR_DEPARTMENT_OTHER): Payer: Medicaid Other

## 2023-05-16 DIAGNOSIS — I340 Nonrheumatic mitral (valve) insufficiency: Secondary | ICD-10-CM | POA: Diagnosis not present

## 2023-05-17 ENCOUNTER — Telehealth (HOSPITAL_BASED_OUTPATIENT_CLINIC_OR_DEPARTMENT_OTHER): Payer: Self-pay | Admitting: *Deleted

## 2023-05-17 LAB — ECHOCARDIOGRAM COMPLETE
Area-P 1/2: 4.89 cm2
MV M vel: 5.34 m/s
MV Peak grad: 114.1 mmHg
Radius: 2 cm
S' Lateral: 3.09 cm

## 2023-05-17 NOTE — Telephone Encounter (Signed)
-----   Message from Christopher Farrell sent at 05/17/2023  9:19 AM EDT ----- Noted diastolic dysfunction (heart does not relax well) and right ventricle reduced function. Significant leaking of mitral and tricuspid valve.   Please call to confirm he has scheduled his dental consult as important to have that taken care of prior to anticipated valve surgery. He was given list of dental providers at recent office visit and also referred to Dr. Chales Salmon [phone] and Dr. Barbette Merino [phone]. Can provide him with their phone numbers if he wishes to check on the status of his referral.   If unable to reach him, recommend calling his mother per DPR.   Will route to cardiothoracic surgery team so they are aware.

## 2023-05-17 NOTE — Telephone Encounter (Signed)
Left message for patient and mother  to call back

## 2023-05-18 NOTE — Telephone Encounter (Signed)
 Advised mother, verbalized understanding  No appointment made at this time, she will call number again (had left messages)

## 2023-05-22 ENCOUNTER — Telehealth: Payer: Self-pay | Admitting: Family

## 2023-05-22 NOTE — Telephone Encounter (Signed)
 Returned call to patient's mother ( ok per DPR), no answer, left message, no need to call back unless she has further questions.

## 2023-05-22 NOTE — Telephone Encounter (Signed)
 Mom returning call to a nurse

## 2023-05-30 ENCOUNTER — Encounter: Payer: Self-pay | Admitting: Nurse Practitioner

## 2023-05-30 ENCOUNTER — Ambulatory Visit (INDEPENDENT_AMBULATORY_CARE_PROVIDER_SITE_OTHER): Payer: Self-pay | Admitting: Nurse Practitioner

## 2023-05-30 VITALS — BP 117/78 | HR 80 | Temp 97.9°F | Wt 130.0 lb

## 2023-05-30 DIAGNOSIS — I330 Acute and subacute infective endocarditis: Secondary | ICD-10-CM

## 2023-05-30 DIAGNOSIS — Z1211 Encounter for screening for malignant neoplasm of colon: Secondary | ICD-10-CM | POA: Diagnosis not present

## 2023-05-30 DIAGNOSIS — I639 Cerebral infarction, unspecified: Secondary | ICD-10-CM | POA: Diagnosis not present

## 2023-05-30 DIAGNOSIS — F419 Anxiety disorder, unspecified: Secondary | ICD-10-CM

## 2023-05-30 DIAGNOSIS — F17200 Nicotine dependence, unspecified, uncomplicated: Secondary | ICD-10-CM | POA: Diagnosis not present

## 2023-05-30 DIAGNOSIS — I959 Hypotension, unspecified: Secondary | ICD-10-CM

## 2023-05-30 DIAGNOSIS — B20 Human immunodeficiency virus [HIV] disease: Secondary | ICD-10-CM

## 2023-05-30 DIAGNOSIS — I5032 Chronic diastolic (congestive) heart failure: Secondary | ICD-10-CM

## 2023-05-30 DIAGNOSIS — F32A Depression, unspecified: Secondary | ICD-10-CM

## 2023-05-30 DIAGNOSIS — H6123 Impacted cerumen, bilateral: Secondary | ICD-10-CM | POA: Diagnosis not present

## 2023-05-30 MED ORDER — MIDODRINE HCL 5 MG PO TABS: 5.0000 mg | ORAL_TABLET | Freq: Three times a day (TID) | ORAL | 3 refills | Status: DC

## 2023-05-30 NOTE — Assessment & Plan Note (Addendum)
 BP Readings from Last 3 Encounters:  05/30/23 117/78  05/02/23 94/60  02/13/23 138/74    No increased shortness of breath, no edema Continue furosemide 40 mg daily take with potassium 20 mill equivalent daily Maintain close follow-up with cardiology

## 2023-05-30 NOTE — Assessment & Plan Note (Signed)
 On Lexapro 20 mg daily Continue current medication

## 2023-05-30 NOTE — Patient Instructions (Signed)
Please consider getting Shingrix  vaccine at local pharmacy.   It is important that you exercise regularly at least 30 minutes 5 times a week as tolerated  Think about what you will eat, plan ahead. Choose " clean, green, fresh or frozen" over canned, processed or packaged foods which are more sugary, salty and fatty. 70 to 75% of food eaten should be vegetables and fruit. Three meals at set times with snacks allowed between meals, but they must be fruit or vegetables. Aim to eat over a 12 hour period , example 7 am to 7 pm, and STOP after  your last meal of the day. Drink water,generally about 64 ounces per day, no other drink is as healthy. Fruit juice is best enjoyed in a healthy way, by EATING the fruit.  Thanks for choosing [facility] we consider it a privelige to serve you.
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
## 2023-05-30 NOTE — Assessment & Plan Note (Addendum)
 Lab Results  Component Value Date   CHOL 58 09/07/2022   HDL <10 (L) 09/07/2022   LDLCALC NOT CALCULATED 09/07/2022   LDLDIRECT 39 10/26/2022   TRIG 122 09/07/2022   CHOLHDL NOT CALCULATED 09/07/2022  Blood pressure is normal, not on a statin but LDL is within normal range Followed by neurology

## 2023-05-30 NOTE — Assessment & Plan Note (Addendum)
 Midodrine 5 mg 3 times daily refilled Blood pressure is normal in the office today Encouraged to maintain hydration BP Readings from Last 3 Encounters:  05/30/23 117/78  05/02/23 94/60  02/13/23 138/74

## 2023-05-30 NOTE — Assessment & Plan Note (Addendum)
Smokes about 3 cigarettes /day  Asked about quitting: confirms that he/she currently smokes cigarettes Advise to quit smoking: Educated about QUITTING to reduce the risk of cancer, cardio and cerebrovascular disease. Assess willingness: Unwilling to quit at this time, but is working on cutting back. Assist with counseling and pharmacotherapy: Counseled for 5 minutes and literature provided. Arrange for follow up: follow up in 4 months and continue to offer help.

## 2023-05-30 NOTE — Assessment & Plan Note (Signed)
 Currently denies fever, chills, chest pain, Will need surgical repair of MV Has upcoming appointment with dentist for possible  tooth extraction before surgery

## 2023-05-30 NOTE — Assessment & Plan Note (Deleted)
 Will need surgical repair of MV Has upcoming appointment with dentist for possible  tooth extraction before surgery

## 2023-05-30 NOTE — Assessment & Plan Note (Signed)
 Patient complains of trouble hearing in both ears, upon examination both he is had excess wax Ear lavage provided for both the ears Ear canals and both tympanic membrane were normal after the procedure  Patient stated that he can now hear well

## 2023-05-30 NOTE — Assessment & Plan Note (Signed)
 Continue Biktarvy 1 tablet daily Maintain close follow-up with infectious disease specialist

## 2023-06-20 ENCOUNTER — Telehealth (HOSPITAL_BASED_OUTPATIENT_CLINIC_OR_DEPARTMENT_OTHER): Payer: Self-pay | Admitting: Family

## 2023-06-20 NOTE — Telephone Encounter (Signed)
 Note received via mail from Dr. Hedwig Livers [facility], [place]) office dated 06/14/23. He had surgical removal of remaining maxillary teeth with alveoloplasty and tolerated well. Per this note 'He should be ready within 1-2 weeks to undergo heart surgery'.   Will route to TCTS team so they are aware.   Lazar Tierce S Diarra Kos, NP
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
## 2023-07-25 ENCOUNTER — Ambulatory Visit: Payer: MEDICAID | Admitting: Adult Health
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
## 2023-07-26 ENCOUNTER — Encounter: Payer: Self-pay | Admitting: Adult Health

## 2023-07-26 ENCOUNTER — Ambulatory Visit: Admitting: Adult Health

## 2023-07-26 VITALS — BP 118/88 | HR 86 | Ht 65.0 in | Wt 126.8 lb

## 2023-07-26 DIAGNOSIS — I6310 Cerebral infarction due to embolism of unspecified precerebral artery: Secondary | ICD-10-CM | POA: Diagnosis not present

## 2023-07-26 NOTE — Patient Instructions (Signed)
 Continue to follow with cardiology as scheduled  Follow back up with your dentist regarding swelling since tooth extraction   Signs of a Stroke? Follow the BEFAST method:  Balance Watch for a sudden loss of balance, trouble with coordination or vertigo Eyes Is there a sudden loss of vision in one or both eyes? Or double vision?  Face: Ask the person to smile. Does one side of the face droop or is it numb?  Arms: Ask the person to raise both arms. Does one arm drift downward? Is there weakness or numbness of a leg? Speech: Ask the person to repeat a simple phrase. Does the speech sound slurred/strange? Is the person confused ? Time: If you observe any of these signs, call 911.          Thank you for coming to see us  at [facility]. I hope we have been able to provide you high quality care today.  You may receive a patient satisfaction survey over the next few weeks. We would appreciate your feedback and comments so that we may continue to improve ourselves and the health of our patients.

## 2023-08-02 ENCOUNTER — Ambulatory Visit: Attending: Surgery | Admitting: Surgery

## 2023-08-02 ENCOUNTER — Encounter: Payer: Self-pay | Admitting: Surgery

## 2023-08-02 ENCOUNTER — Other Ambulatory Visit: Payer: Self-pay | Admitting: Surgery

## 2023-08-02 VITALS — BP 110/84 | HR 83 | Resp 18 | Ht 65.0 in | Wt 124.0 lb

## 2023-08-02 DIAGNOSIS — I340 Nonrheumatic mitral (valve) insufficiency: Secondary | ICD-10-CM

## 2023-08-02 DIAGNOSIS — I361 Nonrheumatic tricuspid (valve) insufficiency: Secondary | ICD-10-CM | POA: Insufficient documentation

## 2023-08-02 NOTE — Progress Notes (Signed)
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 this time was spent face to face counseling and coordinating the care of this patient's severe mitral and tricuspid regurgitation.   Bartley Lightning, MD [facility] [phone]

## 2023-08-04 NOTE — Progress Notes (Signed)
 The ASCVD Risk score (Arnett DK, et al., 2019) failed to calculate for the following reasons:   Risk score cannot be calculated because patient has a medical history suggesting prior/existing ASCVD  Arlon Bergamo, BSN, RN

## 2023-08-07 ENCOUNTER — Telehealth (HOSPITAL_BASED_OUTPATIENT_CLINIC_OR_DEPARTMENT_OTHER): Payer: Self-pay | Admitting: Cardiology

## 2023-08-07 ENCOUNTER — Other Ambulatory Visit: Payer: Self-pay | Admitting: Nurse Practitioner

## 2023-08-07 ENCOUNTER — Ambulatory Visit (HOSPITAL_BASED_OUTPATIENT_CLINIC_OR_DEPARTMENT_OTHER): Payer: Medicaid Other | Admitting: Family

## 2023-08-07 DIAGNOSIS — I5032 Chronic diastolic (congestive) heart failure: Secondary | ICD-10-CM

## 2023-08-07 MED ORDER — FUROSEMIDE 40 MG PO TABS
40.0000 mg | ORAL_TABLET | Freq: Every day | ORAL | 3 refills | Status: DC
Start: 2023-08-07 — End: 2023-10-06

## 2023-08-07 NOTE — Telephone Encounter (Signed)
 *  STAT* If patient is at the pharmacy, call can be transferred to refill team.   1. Which medications need to be refilled? (please list name of each medication and dose if known)   furosemide  (LASIX ) 40 MG tablet   2. Which pharmacy/location (including street and city if local pharmacy) is medication to be sent to?  [id number] - [place] [place] - [address] AT [facility] [address]    3. Do they need a 30 day or 90 day supply?  90

## 2023-08-07 NOTE — Telephone Encounter (Signed)
 Pt's medication was sent to pt's pharmacy as requested. Confirmation received.

## 2023-08-31 ENCOUNTER — Ambulatory Visit (HOSPITAL_BASED_OUTPATIENT_CLINIC_OR_DEPARTMENT_OTHER)

## 2023-08-31 ENCOUNTER — Telehealth: Payer: Self-pay

## 2023-08-31 DIAGNOSIS — I340 Nonrheumatic mitral (valve) insufficiency: Secondary | ICD-10-CM | POA: Diagnosis not present

## 2023-08-31 NOTE — Telephone Encounter (Signed)
 Called pt left voicemail asking to call back to [phone] to schedule next appt.

## 2023-09-01 LAB — ECHOCARDIOGRAM COMPLETE
Area-P 1/2: 7.44 cm2
MV M vel: 5.24 m/s
MV Peak grad: 109.8 mmHg
Radius: 1.4 cm
S' Lateral: 2.22 cm

## 2023-09-04 ENCOUNTER — Encounter: Payer: Self-pay | Admitting: *Deleted

## 2023-09-04 ENCOUNTER — Other Ambulatory Visit: Payer: Self-pay | Admitting: *Deleted

## 2023-09-04 DIAGNOSIS — I340 Nonrheumatic mitral (valve) insufficiency: Secondary | ICD-10-CM

## 2023-09-04 DIAGNOSIS — I361 Nonrheumatic tricuspid (valve) insufficiency: Secondary | ICD-10-CM

## 2023-09-05 ENCOUNTER — Ambulatory Visit (HOSPITAL_BASED_OUTPATIENT_CLINIC_OR_DEPARTMENT_OTHER): Admitting: Family

## 2023-09-12 ENCOUNTER — Other Ambulatory Visit (HOSPITAL_COMMUNITY)

## 2023-09-26 ENCOUNTER — Encounter (HOSPITAL_COMMUNITY): Payer: Self-pay

## 2023-09-26 NOTE — Progress Notes (Signed)
 Surgical Instructions   Your procedure is scheduled on Friday, September 29, 2023. Report to [facility] Main Entrance A at 5:30 A.M., then check in with the Admitting office. Any questions or running late day of surgery: call [phone]  Questions prior to your surgery date: call [phone], Monday-Friday, 8am-4pm. If you experience any cold or flu symptoms such as cough, fever, chills, shortness of breath, etc. between now and your scheduled surgery, please notify us  at the above number.     Remember:  Do not eat or drink after midnight the night before your surgery  Take these medicines the morning of surgery with A SIP OF WATER   bictegravir-emtricitabine -tenofovir  AF (BIKTARVY )  escitalopram  (LEXAPRO )  midodrine  (PROAMATINE )   May take these medicines IF NEEDED: acetaminophen  (TYLENOL )  albuterol  (VENTOLIN  HFA) 108 (90 Base) MCG/ACT inhaler Please bring with you to the hospital   One week prior to surgery, STOP taking any Aspirin  (unless otherwise instructed by your surgeon) Aleve , Naproxen , Ibuprofen , Motrin , Advil , Goody's, BC's, all herbal medications, fish oil, and non-prescription vitamins.                     Do NOT Smoke (Tobacco/Vaping) for 24 hours prior to your procedure.  If you use a CPAP at night, you may bring your mask/headgear for your overnight stay.   You will be asked to remove any contacts, glasses, piercing's, hearing aid's, dentures/partials prior to surgery. Please bring cases for these items if needed.    Patients discharged the day of surgery will not be allowed to drive home, and someone needs to stay with them for 24 hours.  SURGICAL WAITING ROOM VISITATION Patients may have no more than 2 support people in the waiting area - these visitors may rotate.   Pre-op nurse will coordinate an appropriate time for 1 ADULT support person, who may not rotate, to accompany patient in pre-op.  Children under the age of 56 must have an adult with them who is  not the patient and must remain in the main waiting area with an adult.  If the patient needs to stay at the hospital during part of their recovery, the visitor guidelines for inpatient rooms apply.  Please refer to the [facility] website for the visitor guidelines for any additional information.   If you received a COVID test during your pre-op visit  it is requested that you wear a mask when out in public, stay away from anyone that may not be feeling well and notify your surgeon if you develop symptoms. If you have been in contact with anyone that has tested positive in the last 10 days please notify you surgeon.      Pre-operative CHG Bathing Instructions   You can play a key role in reducing the risk of infection after surgery. Your skin needs to be as free of germs as possible. You can reduce the number of germs on your skin by washing with CHG (chlorhexidine  gluconate) soap before surgery. CHG is an antiseptic soap that kills germs and continues to kill germs even after washing.   DO NOT use if you have an allergy to chlorhexidine /CHG or antibacterial soaps. If your skin becomes reddened or irritated, stop using the CHG and notify one of our RNs at [phone].              TAKE A SHOWER THE NIGHT BEFORE SURGERY AND THE DAY OF SURGERY    Please keep in mind the following:  You may  shave your face before/day of surgery.  Place clean sheets on your bed the night before surgery Use a clean washcloth (not used since being washed) for each shower. DO NOT sleep with pet's night before surgery.  CHG Shower Instructions:  Wash your face and private area with normal soap. If you choose to wash your hair, wash first with your normal shampoo.  After you use shampoo/soap, rinse your hair and body thoroughly to remove shampoo/soap residue.  Turn the water  OFF and apply half the bottle of CHG soap to a CLEAN washcloth.  Apply CHG soap ONLY FROM YOUR NECK DOWN TO YOUR TOES (washing for 3-5  minutes)  DO NOT use CHG soap on face, private areas, open wounds, or sores.  Pay special attention to the area where your surgery is being performed.  If you are having back surgery, having someone wash your back for you may be helpful. Wait 2 minutes after CHG soap is applied, then you may rinse off the CHG soap.  Pat dry with a clean towel  Put on clean pajamas    Additional instructions for the day of surgery: DO NOT APPLY any lotions, deodorants or cologne.   Do not wear jewelry Do not bring valuables to the hospital. [facility] is not responsible for valuables/personal belongings. Put on clean/comfortable clothes.  Please brush your teeth.  Ask your nurse before applying any prescription medications to the skin.

## 2023-09-27 ENCOUNTER — Inpatient Hospital Stay (HOSPITAL_COMMUNITY)
Admission: RE | Admit: 2023-09-27 | Discharge: 2023-09-27 | Disposition: A | Discharge status: Home / Self Care | Source: Ambulatory Visit

## 2023-09-27 ENCOUNTER — Ambulatory Visit (HOSPITAL_COMMUNITY): Admission: RE | Admit: 2023-09-27 | Source: Ambulatory Visit

## 2023-09-27 HISTORY — DX: Endocarditis, valve unspecified: I38

## 2023-09-27 HISTORY — DX: Depression, unspecified: F32.A

## 2023-09-27 HISTORY — DX: Cerebral infarction, unspecified: I63.9

## 2023-09-27 HISTORY — DX: Bacterial meningitis, unspecified: G00.9

## 2023-09-27 HISTORY — DX: Anxiety disorder, unspecified: F41.9

## 2023-09-27 HISTORY — DX: Anemia, unspecified: D64.9

## 2023-09-27 NOTE — Progress Notes (Incomplete)
 PCP - Paseda, Folashade R, FNP  Cardiologist -  Annabella Scarce, MD    Neurology -Whitfield Raisin, NP   PPM/ICD -  Device Orders -  Rep Notified -   Chest x-ray -  EKG -  Stress Test -  ECHO -  Cardiac Cath -   Sleep Study -  CPAP -   Fasting Blood Sugar -  Checks Blood Sugar _____ times a day  Last dose of GLP1 agonist-   GLP1 instructions:   Blood Thinner Instructions: Aspirin  Instructions:  ERAS Protcol - PRE-SURGERY Ensure or G2-   COVID TEST-    Anesthesia review:   Patient denies shortness of breath, fever, cough and chest pain at PAT appointment   All instructions explained to the patient, with a verbal understanding of the material. Patient agrees to go over the instructions while at home for a better understanding. Patient also instructed to self quarantine after being tested for COVID-19. The opportunity to ask questions was provided.

## 2023-09-28 ENCOUNTER — Ambulatory Visit (HOSPITAL_BASED_OUTPATIENT_CLINIC_OR_DEPARTMENT_OTHER)
Admission: RE | Admit: 2023-09-28 | Discharge: 2023-09-28 | Disposition: A | Discharge status: Home / Self Care | Source: Ambulatory Visit | Attending: Surgery | Admitting: Surgery

## 2023-09-28 ENCOUNTER — Ambulatory Visit (HOSPITAL_COMMUNITY)
Admission: RE | Admit: 2023-09-28 | Discharge: 2023-09-28 | Disposition: A | Discharge status: Home / Self Care | Source: Ambulatory Visit | Attending: Surgery | Admitting: Surgery

## 2023-09-28 ENCOUNTER — Encounter (HOSPITAL_COMMUNITY): Payer: Self-pay

## 2023-09-28 ENCOUNTER — Other Ambulatory Visit: Payer: Self-pay

## 2023-09-28 ENCOUNTER — Encounter (HOSPITAL_COMMUNITY)
Admission: RE | Admit: 2023-09-28 | Discharge: 2023-09-28 | Disposition: A | Discharge status: Home / Self Care | Source: Ambulatory Visit | Attending: Surgery | Admitting: Surgery

## 2023-09-28 VITALS — BP 95/81 | HR 85 | Temp 98.4°F | Resp 16 | Ht 64.0 in | Wt 105.1 lb

## 2023-09-28 DIAGNOSIS — I361 Nonrheumatic tricuspid (valve) insufficiency: Secondary | ICD-10-CM

## 2023-09-28 DIAGNOSIS — Z01818 Encounter for other preprocedural examination: Secondary | ICD-10-CM

## 2023-09-28 DIAGNOSIS — I340 Nonrheumatic mitral (valve) insufficiency: Secondary | ICD-10-CM

## 2023-09-28 HISTORY — DX: Dyspnea, unspecified: R06.00

## 2023-09-28 LAB — COMPREHENSIVE METABOLIC PANEL WITH GFR
ALT: 21 U/L (ref 0–44)
AST: 37 U/L (ref 15–41)
Albumin: 3.9 g/dL (ref 3.5–5.0)
Alkaline Phosphatase: 87 U/L (ref 38–126)
Anion gap: 10 (ref 5–15)
BUN: 5 mg/dL — ABNORMAL LOW (ref 8–23)
CO2: 26 mmol/L (ref 22–32)
Calcium: 9.5 mg/dL (ref 8.9–10.3)
Chloride: 103 mmol/L (ref 98–111)
Creatinine, Ser: 0.69 mg/dL (ref 0.61–1.24)
GFR, Estimated: >60 mL/min (ref >60)
Glucose, Bld: 84 mg/dL (ref 70–99)
Potassium: 4.7 mmol/L (ref 3.5–5.1)
Sodium: 139 mmol/L (ref 135–145)
Total Bilirubin: 1.1 mg/dL (ref 0.0–1.2)
Total Protein: 7.7 g/dL (ref 6.5–8.1)

## 2023-09-28 LAB — URINALYSIS, ROUTINE W REFLEX MICROSCOPIC
Bilirubin Urine: NEGATIVE
Glucose, UA: NEGATIVE mg/dL
Hgb urine dipstick: NEGATIVE
Ketones, ur: NEGATIVE mg/dL
Leukocytes,Ua: NEGATIVE
Nitrite: NEGATIVE
Protein, ur: NEGATIVE mg/dL
Specific Gravity, Urine: 1.015 (ref 1.005–1.030)
pH: 5 (ref 5.0–8.0)

## 2023-09-28 LAB — CBC
HCT: 44.3 % (ref 39.0–52.0)
Hemoglobin: 14.6 g/dL (ref 13.0–17.0)
MCH: 31.5 pg (ref 26.0–34.0)
MCHC: 33 g/dL (ref 30.0–36.0)
MCV: 95.7 fL (ref 80.0–100.0)
Platelets: 161 K/uL (ref 150–400)
RBC: 4.63 MIL/uL (ref 4.22–5.81)
RDW: 13.3 % (ref 11.5–15.5)
WBC: 6.9 K/uL (ref 4.0–10.5)
nRBC: 0 % (ref 0.0–0.2)

## 2023-09-28 LAB — APTT: aPTT: 27 s (ref 24–36)

## 2023-09-28 LAB — PROTIME-INR
INR: 1 (ref 0.8–1.2)
Prothrombin Time: 14 s (ref 11.4–15.2)

## 2023-09-28 LAB — HEMOGLOBIN A1C
Hgb A1c MFr Bld: 5.7 % — ABNORMAL HIGH (ref 4.8–5.6)
Mean Plasma Glucose: 116.89 mg/dL

## 2023-09-28 LAB — SURGICAL PCR SCREEN
MRSA, PCR: NEGATIVE
Staphylococcus aureus: NEGATIVE

## 2023-09-28 MED ORDER — PLASMA-LYTE A IV SOLN
INTRAVENOUS | Status: DC
  Filled 2023-09-28: qty 2.5

## 2023-09-28 MED ORDER — TRANEXAMIC ACID (OHS) PUMP PRIME SOLUTION
2.0000 mg/kg | INTRAVENOUS | Status: DC
  Filled 2023-09-28: qty 0.95

## 2023-09-28 MED ORDER — DEXMEDETOMIDINE HCL IN NACL 400 MCG/100ML IV SOLN
0.1000 ug/kg/h | INTRAVENOUS | Status: AC
  Administered 2023-09-29: .3 ug/kg/h via INTRAVENOUS
  Filled 2023-09-28: qty 100

## 2023-09-28 MED ORDER — EPINEPHRINE HCL 5 MG/250ML IV SOLN IN NS
0.0000 ug/min | INTRAVENOUS | Status: AC
  Administered 2023-09-29: 2 ug/min via INTRAVENOUS
  Filled 2023-09-28: qty 250

## 2023-09-28 MED ORDER — TRANEXAMIC ACID 1000 MG/10ML IV SOLN
1.5000 mg/kg/h | INTRAVENOUS | Status: AC
  Administered 2023-09-29: 1.5 mg/kg/h via INTRAVENOUS
  Filled 2023-09-28: qty 25

## 2023-09-28 MED ORDER — MANNITOL 20 % IV SOLN
INTRAVENOUS | Status: DC
  Filled 2023-09-28: qty 13

## 2023-09-28 MED ORDER — NOREPINEPHRINE 4 MG/250ML-% IV SOLN
0.0000 ug/min | INTRAVENOUS | Status: AC
  Administered 2023-09-29: 2 ug/min via INTRAVENOUS
  Filled 2023-09-28: qty 250

## 2023-09-28 MED ORDER — INSULIN REGULAR(HUMAN) IN NACL 100-0.9 UT/100ML-% IV SOLN
INTRAVENOUS | Status: DC
  Filled 2023-09-28: qty 100

## 2023-09-28 MED ORDER — POTASSIUM CHLORIDE 2 MEQ/ML IV SOLN
80.0000 meq | INTRAVENOUS | Status: DC
  Filled 2023-09-28: qty 40

## 2023-09-28 MED ORDER — NITROGLYCERIN IN D5W 200-5 MCG/ML-% IV SOLN
2.0000 ug/min | INTRAVENOUS | Status: DC
  Filled 2023-09-28: qty 250

## 2023-09-28 MED ORDER — TRANEXAMIC ACID (OHS) BOLUS VIA INFUSION
15.0000 mg/kg | INTRAVENOUS | Status: AC
  Administered 2023-09-29: 715.5 mg via INTRAVENOUS
  Filled 2023-09-28: qty 716

## 2023-09-28 MED ORDER — PHENYLEPHRINE HCL-NACL 20-0.9 MG/250ML-% IV SOLN
30.0000 ug/min | INTRAVENOUS | Status: DC
  Filled 2023-09-28: qty 250

## 2023-09-28 MED ORDER — CEFAZOLIN SODIUM-DEXTROSE 2-4 GM/100ML-% IV SOLN
2.0000 g | INTRAVENOUS | Status: DC
  Filled 2023-09-28: qty 100

## 2023-09-28 MED ORDER — MILRINONE LACTATE IN DEXTROSE 20-5 MG/100ML-% IV SOLN
0.3000 ug/kg/min | INTRAVENOUS | Status: AC
  Administered 2023-09-29: .25 ug/kg/min via INTRAVENOUS
  Filled 2023-09-28: qty 100

## 2023-09-28 MED ORDER — VANCOMYCIN HCL 1250 MG/250ML IV SOLN
1250.0000 mg | INTRAVENOUS | Status: AC
  Administered 2023-09-29: 1250 mg via INTRAVENOUS
  Filled 2023-09-28: qty 250

## 2023-09-28 MED ORDER — HEPARIN 30,000 UNITS/1000 ML (OHS) CELLSAVER SOLUTION
Status: DC
  Filled 2023-09-28: qty 1000

## 2023-09-28 MED ORDER — CEFAZOLIN SODIUM-DEXTROSE 2-4 GM/100ML-% IV SOLN
2.0000 g | INTRAVENOUS | Status: AC
  Administered 2023-09-29 (×2): 2 g via INTRAVENOUS
  Filled 2023-09-28: qty 100

## 2023-09-28 NOTE — Progress Notes (Signed)
 VASCULAR LAB    Carotid duplex has been performed.  See CV proc for preliminary results.   Snyder Colavito, RVT 09/28/2023, 9:50 AM

## 2023-09-28 NOTE — Anesthesia Preprocedure Evaluation (Signed)
 Anesthesia Evaluation  Patient identified by MRN, date of birth, ID band Patient awake    Reviewed: Allergy & Precautions, H&P , NPO status , Patient's Chart, lab work & pertinent test results  History of Anesthesia Complications Negative for: history of anesthetic complications  Airway Mallampati: II  TM Distance: >3 FB Neck ROM: Full    Dental no notable dental hx. (+) Edentulous Upper, Edentulous Lower   Pulmonary Current Smoker and Patient abstained from smoking.   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 Severe MR, Severe pHTN, Mod reduced RV Normal LV function  Moderate to severe TR  Left vent septal flattening    Neuro/Psych  PSYCHIATRIC DISORDERS Anxiety Depression    Septic embolic CVA Bacterial meningitis CVA    GI/Hepatic negative GI ROS, Neg liver ROS,,,  Endo/Other  negative endocrine ROS    Renal/GU negative Renal ROS  negative genitourinary   Musculoskeletal negative musculoskeletal ROS (+)    Abdominal   Peds  Hematology  (+) HIV  Anesthesia Other Findings   Reproductive/Obstetrics negative OB ROS                              Anesthesia Physical Anesthesia Plan  ASA: 4  Anesthesia Plan: General   Post-op Pain Management:    Induction: Intravenous  PONV Risk Score and Plan: Treatment may vary due to age or medical condition  Airway Management Planned: Oral ETT  Additional Equipment: Arterial line, CVP, PA Cath, 3D TEE, TEE and Ultrasound Guidance Line Placement  Intra-op Plan:   Post-operative Plan: Extubation in OR  Informed Consent: I have reviewed the patients History and Physical, chart, labs and discussed the procedure including the risks, benefits  and alternatives for the proposed anesthesia with the patient or authorized representative who has indicated his/her understanding and acceptance.     Dental advisory given  Plan Discussed with: CRNA  Anesthesia Plan Comments: (Vaso, Levo Epi Milrinone  in room    2 Large PIV MAC CVL Radial )         Anesthesia Quick Evaluation
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
## 2023-09-28 NOTE — Progress Notes (Signed)
 PCP - Thomes SAUNDERS, FNP  Cardiologist -  Annabella Scarce, MD  Neurology -Whitfield Raisin, NP   PPM/ICD - denies Device Orders - n/a Rep Notified - n/a  Chest x-ray - 09-28-23 EKG - 09-28-23 Stress Test -  ECHO - 08-31-23 Cardiac Cath - 10-10-23  Sleep Study - denies CPAP - n/a   DM -denies  Blood Thinner Instructions:denies Aspirin  Instructions:denies  ERAS Protcol - NPO   COVID TEST- n/a   Anesthesia review: yes hx of stroke  Patient denies shortness of breath, fever, cough and chest pain at PAT appointment   All instructions explained to the patient, with a verbal understanding of the material. Patient agrees to go over the instructions while at home for a better understanding. Patient also instructed to self quarantine after being tested for COVID-19. The opportunity to ask questions was provided.

## 2023-09-29 ENCOUNTER — Inpatient Hospital Stay (HOSPITAL_COMMUNITY)
Admission: RE | Disposition: A | Discharge status: Home / Self Care | Payer: Self-pay | Source: Home / Self Care | Attending: Surgery

## 2023-09-29 ENCOUNTER — Inpatient Hospital Stay (HOSPITAL_COMMUNITY)
Admission: RE | Admit: 2023-09-29 | Discharge: 2023-10-06 | DRG: 220 | Disposition: A | Discharge status: Home Health | Attending: Surgery | Admitting: Surgery

## 2023-09-29 ENCOUNTER — Ambulatory Visit: Payer: Self-pay | Admitting: Nurse Practitioner

## 2023-09-29 ENCOUNTER — Inpatient Hospital Stay (HOSPITAL_COMMUNITY): Payer: Self-pay | Admitting: Certified Registered Nurse Anesthetist

## 2023-09-29 ENCOUNTER — Inpatient Hospital Stay (HOSPITAL_COMMUNITY)

## 2023-09-29 ENCOUNTER — Other Ambulatory Visit: Payer: Self-pay

## 2023-09-29 DIAGNOSIS — Z79899 Other long term (current) drug therapy: Secondary | ICD-10-CM | POA: Diagnosis not present

## 2023-09-29 DIAGNOSIS — I459 Conduction disorder, unspecified: Principal | ICD-10-CM

## 2023-09-29 DIAGNOSIS — R54 Age-related physical debility: Secondary | ICD-10-CM | POA: Diagnosis present

## 2023-09-29 DIAGNOSIS — Z8614 Personal history of Methicillin resistant Staphylococcus aureus infection: Secondary | ICD-10-CM

## 2023-09-29 DIAGNOSIS — I361 Nonrheumatic tricuspid (valve) insufficiency: Secondary | ICD-10-CM | POA: Diagnosis not present

## 2023-09-29 DIAGNOSIS — I4892 Unspecified atrial flutter: Secondary | ICD-10-CM | POA: Diagnosis not present

## 2023-09-29 DIAGNOSIS — I959 Hypotension, unspecified: Secondary | ICD-10-CM | POA: Diagnosis not present

## 2023-09-29 DIAGNOSIS — Z8661 Personal history of infections of the central nervous system: Secondary | ICD-10-CM

## 2023-09-29 DIAGNOSIS — I330 Acute and subacute infective endocarditis: Principal | ICD-10-CM | POA: Diagnosis present

## 2023-09-29 DIAGNOSIS — Z7982 Long term (current) use of aspirin: Secondary | ICD-10-CM | POA: Diagnosis not present

## 2023-09-29 DIAGNOSIS — I4891 Unspecified atrial fibrillation: Secondary | ICD-10-CM | POA: Diagnosis not present

## 2023-09-29 DIAGNOSIS — I110 Hypertensive heart disease with heart failure: Secondary | ICD-10-CM

## 2023-09-29 DIAGNOSIS — F17210 Nicotine dependence, cigarettes, uncomplicated: Secondary | ICD-10-CM

## 2023-09-29 DIAGNOSIS — I359 Nonrheumatic aortic valve disorder, unspecified: Secondary | ICD-10-CM | POA: Diagnosis not present

## 2023-09-29 DIAGNOSIS — Z8673 Personal history of transient ischemic attack (TIA), and cerebral infarction without residual deficits: Secondary | ICD-10-CM | POA: Diagnosis not present

## 2023-09-29 DIAGNOSIS — I349 Nonrheumatic mitral valve disorder, unspecified: Secondary | ICD-10-CM | POA: Diagnosis not present

## 2023-09-29 DIAGNOSIS — Z801 Family history of malignant neoplasm of trachea, bronchus and lung: Secondary | ICD-10-CM

## 2023-09-29 DIAGNOSIS — I492 Junctional premature depolarization: Secondary | ICD-10-CM | POA: Diagnosis not present

## 2023-09-29 DIAGNOSIS — I2729 Other secondary pulmonary hypertension: Secondary | ICD-10-CM | POA: Diagnosis present

## 2023-09-29 DIAGNOSIS — I4589 Other specified conduction disorders: Secondary | ICD-10-CM | POA: Diagnosis not present

## 2023-09-29 DIAGNOSIS — I340 Nonrheumatic mitral (valve) insufficiency: Secondary | ICD-10-CM

## 2023-09-29 DIAGNOSIS — I5021 Acute systolic (congestive) heart failure: Secondary | ICD-10-CM | POA: Diagnosis not present

## 2023-09-29 DIAGNOSIS — I081 Rheumatic disorders of both mitral and tricuspid valves: Secondary | ICD-10-CM

## 2023-09-29 DIAGNOSIS — I442 Atrioventricular block, complete: Secondary | ICD-10-CM | POA: Diagnosis not present

## 2023-09-29 DIAGNOSIS — I5032 Chronic diastolic (congestive) heart failure: Secondary | ICD-10-CM | POA: Diagnosis not present

## 2023-09-29 DIAGNOSIS — Z9889 Other specified postprocedural states: Secondary | ICD-10-CM

## 2023-09-29 DIAGNOSIS — I272 Pulmonary hypertension, unspecified: Secondary | ICD-10-CM | POA: Diagnosis not present

## 2023-09-29 DIAGNOSIS — Z21 Asymptomatic human immunodeficiency virus [HIV] infection status: Secondary | ICD-10-CM | POA: Diagnosis present

## 2023-09-29 DIAGNOSIS — Z91148 Patient's other noncompliance with medication regimen for other reason: Secondary | ICD-10-CM | POA: Diagnosis not present

## 2023-09-29 DIAGNOSIS — Z952 Presence of prosthetic heart valve: Principal | ICD-10-CM

## 2023-09-29 DIAGNOSIS — R7303 Prediabetes: Secondary | ICD-10-CM | POA: Diagnosis present

## 2023-09-29 DIAGNOSIS — I50810 Right heart failure, unspecified: Secondary | ICD-10-CM | POA: Diagnosis present

## 2023-09-29 HISTORY — PX: TRICUSPID VALVE REPLACEMENT: SHX816

## 2023-09-29 HISTORY — PX: MITRAL VALVE REPLACEMENT: SHX147

## 2023-09-29 HISTORY — PX: CLIPPING OF ATRIAL APPENDAGE: SHX5773

## 2023-09-29 HISTORY — PX: TEE WITHOUT CARDIOVERSION: SHX5443

## 2023-09-29 LAB — POCT I-STAT 7, (LYTES, BLD GAS, ICA,H+H)
Acid-Base Excess: 1 mmol/L (ref 0.0–2.0)
Acid-Base Excess: 0 mmol/L (ref 0.0–2.0)
Acid-base deficit: 5 mmol/L — ABNORMAL HIGH (ref 0.0–2.0)
Acid-base deficit: 1 mmol/L (ref 0.0–2.0)
Acid-base deficit: 2 mmol/L (ref 0.0–2.0)
Acid-base deficit: 2 mmol/L (ref 0.0–2.0)
Acid-base deficit: 1 mmol/L (ref 0.0–2.0)
Bicarbonate: 21.4 mmol/L (ref 20.0–28.0)
Bicarbonate: 24.9 mmol/L (ref 20.0–28.0)
Bicarbonate: 27.2 mmol/L (ref 20.0–28.0)
Bicarbonate: 23.8 mmol/L (ref 20.0–28.0)
Bicarbonate: 24.9 mmol/L (ref 20.0–28.0)
Bicarbonate: 25 mmol/L (ref 20.0–28.0)
Bicarbonate: 24.8 mmol/L (ref 20.0–28.0)
Calcium, Ion: 1.21 mmol/L (ref 1.15–1.40)
Calcium, Ion: 0.98 mmol/L — ABNORMAL LOW (ref 1.15–1.40)
Calcium, Ion: 1.07 mmol/L — ABNORMAL LOW (ref 1.15–1.40)
Calcium, Ion: 1.03 mmol/L — ABNORMAL LOW (ref 1.15–1.40)
Calcium, Ion: 1.09 mmol/L — ABNORMAL LOW (ref 1.15–1.40)
Calcium, Ion: 1.11 mmol/L — ABNORMAL LOW (ref 1.15–1.40)
Calcium, Ion: 1.12 mmol/L — ABNORMAL LOW (ref 1.15–1.40)
HCT: 37 % — ABNORMAL LOW (ref 39.0–52.0)
HCT: 26 % — ABNORMAL LOW (ref 39.0–52.0)
HCT: 28 % — ABNORMAL LOW (ref 39.0–52.0)
HCT: 24 % — ABNORMAL LOW (ref 39.0–52.0)
HCT: 27 % — ABNORMAL LOW (ref 39.0–52.0)
HCT: 26 % — ABNORMAL LOW (ref 39.0–52.0)
HCT: 27 % — ABNORMAL LOW (ref 39.0–52.0)
Hemoglobin: 12.6 g/dL — ABNORMAL LOW (ref 13.0–17.0)
Hemoglobin: 8.8 g/dL — ABNORMAL LOW (ref 13.0–17.0)
Hemoglobin: 9.5 g/dL — ABNORMAL LOW (ref 13.0–17.0)
Hemoglobin: 8.2 g/dL — ABNORMAL LOW (ref 13.0–17.0)
Hemoglobin: 9.2 g/dL — ABNORMAL LOW (ref 13.0–17.0)
Hemoglobin: 8.8 g/dL — ABNORMAL LOW (ref 13.0–17.0)
Hemoglobin: 9.2 g/dL — ABNORMAL LOW (ref 13.0–17.0)
O2 Saturation: 100 %
O2 Saturation: 100 %
O2 Saturation: 100 %
O2 Saturation: 100 %
O2 Saturation: 100 %
O2 Saturation: 99 %
O2 Saturation: 98 %
Patient temperature: 35.7
Patient temperature: 37
Patient temperature: 37.3
Potassium: 4.4 mmol/L (ref 3.5–5.1)
Potassium: 4.2 mmol/L (ref 3.5–5.1)
Potassium: 4.4 mmol/L (ref 3.5–5.1)
Potassium: 3.8 mmol/L (ref 3.5–5.1)
Potassium: 3.8 mmol/L (ref 3.5–5.1)
Potassium: 4.1 mmol/L (ref 3.5–5.1)
Potassium: 4.1 mmol/L (ref 3.5–5.1)
Sodium: 142 mmol/L (ref 135–145)
Sodium: 141 mmol/L (ref 135–145)
Sodium: 140 mmol/L (ref 135–145)
Sodium: 140 mmol/L (ref 135–145)
Sodium: 143 mmol/L (ref 135–145)
Sodium: 143 mmol/L (ref 135–145)
Sodium: 145 mmol/L (ref 135–145)
TCO2: 23 mmol/L (ref 22–32)
TCO2: 26 mmol/L (ref 22–32)
TCO2: 29 mmol/L (ref 22–32)
TCO2: 25 mmol/L (ref 22–32)
TCO2: 26 mmol/L (ref 22–32)
TCO2: 26 mmol/L (ref 22–32)
TCO2: 26 mmol/L (ref 22–32)
pCO2 arterial: 42 mmHg (ref 32–48)
pCO2 arterial: 48.6 mmHg — ABNORMAL HIGH (ref 32–48)
pCO2 arterial: 47.3 mmHg (ref 32–48)
pCO2 arterial: 46.2 mmHg (ref 32–48)
pCO2 arterial: 50.1 mmHg — ABNORMAL HIGH (ref 32–48)
pCO2 arterial: 44.5 mmHg (ref 32–48)
pCO2 arterial: 43.2 mmHg (ref 32–48)
pH, Arterial: 7.315 — ABNORMAL LOW (ref 7.35–7.45)
pH, Arterial: 7.318 — ABNORMAL LOW (ref 7.35–7.45)
pH, Arterial: 7.367 (ref 7.35–7.45)
pH, Arterial: 7.32 — ABNORMAL LOW (ref 7.35–7.45)
pH, Arterial: 7.298 — ABNORMAL LOW (ref 7.35–7.45)
pH, Arterial: 7.357 (ref 7.35–7.45)
pH, Arterial: 7.37 (ref 7.35–7.45)
pO2, Arterial: 464 mmHg — ABNORMAL HIGH (ref 83–108)
pO2, Arterial: 337 mmHg — ABNORMAL HIGH (ref 83–108)
pO2, Arterial: 268 mmHg — ABNORMAL HIGH (ref 83–108)
pO2, Arterial: 466 mmHg — ABNORMAL HIGH (ref 83–108)
pO2, Arterial: 266 mmHg — ABNORMAL HIGH (ref 83–108)
pO2, Arterial: 172 mmHg — ABNORMAL HIGH (ref 83–108)
pO2, Arterial: 109 mmHg — ABNORMAL HIGH (ref 83–108)

## 2023-09-29 LAB — POCT I-STAT, CHEM 8
BUN: 13 mg/dL (ref 8–23)
BUN: 12 mg/dL (ref 8–23)
BUN: 11 mg/dL (ref 8–23)
BUN: 11 mg/dL (ref 8–23)
BUN: 11 mg/dL (ref 8–23)
Calcium, Ion: 1.23 mmol/L (ref 1.15–1.40)
Calcium, Ion: 1.22 mmol/L (ref 1.15–1.40)
Calcium, Ion: 1.04 mmol/L — ABNORMAL LOW (ref 1.15–1.40)
Calcium, Ion: 1.03 mmol/L — ABNORMAL LOW (ref 1.15–1.40)
Calcium, Ion: 1.02 mmol/L — ABNORMAL LOW (ref 1.15–1.40)
Chloride: 107 mmol/L (ref 98–111)
Chloride: 104 mmol/L (ref 98–111)
Chloride: 104 mmol/L (ref 98–111)
Chloride: 106 mmol/L (ref 98–111)
Chloride: 106 mmol/L (ref 98–111)
Creatinine, Ser: 0.9 mg/dL (ref 0.61–1.24)
Creatinine, Ser: 0.8 mg/dL (ref 0.61–1.24)
Creatinine, Ser: 0.9 mg/dL (ref 0.61–1.24)
Creatinine, Ser: 0.8 mg/dL (ref 0.61–1.24)
Creatinine, Ser: 0.8 mg/dL (ref 0.61–1.24)
Glucose, Bld: 105 mg/dL — ABNORMAL HIGH (ref 70–99)
Glucose, Bld: 186 mg/dL — ABNORMAL HIGH (ref 70–99)
Glucose, Bld: 177 mg/dL — ABNORMAL HIGH (ref 70–99)
Glucose, Bld: 127 mg/dL — ABNORMAL HIGH (ref 70–99)
Glucose, Bld: 198 mg/dL — ABNORMAL HIGH (ref 70–99)
HCT: 39 % (ref 39.0–52.0)
HCT: 36 % — ABNORMAL LOW (ref 39.0–52.0)
HCT: 29 % — ABNORMAL LOW (ref 39.0–52.0)
HCT: 26 % — ABNORMAL LOW (ref 39.0–52.0)
HCT: 27 % — ABNORMAL LOW (ref 39.0–52.0)
Hemoglobin: 13.3 g/dL (ref 13.0–17.0)
Hemoglobin: 12.2 g/dL — ABNORMAL LOW (ref 13.0–17.0)
Hemoglobin: 9.9 g/dL — ABNORMAL LOW (ref 13.0–17.0)
Hemoglobin: 8.8 g/dL — ABNORMAL LOW (ref 13.0–17.0)
Hemoglobin: 9.2 g/dL — ABNORMAL LOW (ref 13.0–17.0)
Potassium: 4.5 mmol/L (ref 3.5–5.1)
Potassium: 3.8 mmol/L (ref 3.5–5.1)
Potassium: 4.2 mmol/L (ref 3.5–5.1)
Potassium: 4.7 mmol/L (ref 3.5–5.1)
Potassium: 3.9 mmol/L (ref 3.5–5.1)
Sodium: 142 mmol/L (ref 135–145)
Sodium: 139 mmol/L (ref 135–145)
Sodium: 139 mmol/L (ref 135–145)
Sodium: 140 mmol/L (ref 135–145)
Sodium: 140 mmol/L (ref 135–145)
TCO2: 22 mmol/L (ref 22–32)
TCO2: 24 mmol/L (ref 22–32)
TCO2: 27 mmol/L (ref 22–32)
TCO2: 25 mmol/L (ref 22–32)
TCO2: 24 mmol/L (ref 22–32)

## 2023-09-29 LAB — BASIC METABOLIC PANEL WITH GFR
Anion gap: 8 (ref 5–15)
BUN: 10 mg/dL (ref 8–23)
CO2: 24 mmol/L (ref 22–32)
Calcium: 7.7 mg/dL — ABNORMAL LOW (ref 8.9–10.3)
Chloride: 110 mmol/L (ref 98–111)
Creatinine, Ser: 0.78 mg/dL (ref 0.61–1.24)
GFR, Estimated: >60 mL/min (ref >60)
Glucose, Bld: 123 mg/dL — ABNORMAL HIGH (ref 70–99)
Potassium: 4.1 mmol/L (ref 3.5–5.1)
Sodium: 142 mmol/L (ref 135–145)

## 2023-09-29 LAB — ECHO INTRAOPERATIVE TEE
AV Mean grad: 4.5 mmHg
AV Peak grad: 8.4 mmHg
Ao pk vel: 1.45 m/s
Height: 64 in
MV M vel: 4.4 m/s
MV Peak grad: 77.4 mmHg
Radius: 1.2 cm
Weight: 1681.6 [oz_av]

## 2023-09-29 LAB — CBC
HCT: 28.8 % — ABNORMAL LOW (ref 39.0–52.0)
Hemoglobin: 9.5 g/dL — ABNORMAL LOW (ref 13.0–17.0)
MCH: 32.1 pg (ref 26.0–34.0)
MCHC: 33 g/dL (ref 30.0–36.0)
MCV: 97.3 fL (ref 80.0–100.0)
Platelets: 105 K/uL — ABNORMAL LOW (ref 150–400)
RBC: 2.96 MIL/uL — ABNORMAL LOW (ref 4.22–5.81)
RDW: 13.4 % (ref 11.5–15.5)
WBC: 12.4 K/uL — ABNORMAL HIGH (ref 4.0–10.5)
nRBC: 0 % (ref 0.0–0.2)

## 2023-09-29 LAB — MAGNESIUM: Magnesium: 3.6 mg/dL — ABNORMAL HIGH (ref 1.7–2.4)

## 2023-09-29 LAB — CBC WITH DIFFERENTIAL/PLATELET
Abs Immature Granulocytes: 0.03 K/uL (ref 0.00–0.07)
Basophils Absolute: 0 K/uL (ref 0.0–0.1)
Basophils Relative: 0 %
Eosinophils Absolute: 0.1 K/uL (ref 0.0–0.5)
Eosinophils Relative: 1 %
HCT: 27.5 % — ABNORMAL LOW (ref 39.0–52.0)
Hemoglobin: 9.2 g/dL — ABNORMAL LOW (ref 13.0–17.0)
Immature Granulocytes: 0 %
Lymphocytes Relative: 18 %
Lymphs Abs: 1.5 K/uL (ref 0.7–4.0)
MCH: 32.2 pg (ref 26.0–34.0)
MCHC: 33.5 g/dL (ref 30.0–36.0)
MCV: 96.2 fL (ref 80.0–100.0)
Monocytes Absolute: 0.6 K/uL (ref 0.1–1.0)
Monocytes Relative: 7 %
Neutro Abs: 6.4 K/uL (ref 1.7–7.7)
Neutrophils Relative %: 74 %
Platelets: 100 K/uL — ABNORMAL LOW (ref 150–400)
RBC: 2.86 MIL/uL — ABNORMAL LOW (ref 4.22–5.81)
RDW: 13.5 % (ref 11.5–15.5)
WBC: 8.6 K/uL (ref 4.0–10.5)
nRBC: 0 % (ref 0.0–0.2)

## 2023-09-29 LAB — POCT I-STAT EG7
Acid-base deficit: 1 mmol/L (ref 0.0–2.0)
Bicarbonate: 23.2 mmol/L (ref 20.0–28.0)
Calcium, Ion: 1.02 mmol/L — ABNORMAL LOW (ref 1.15–1.40)
HCT: 26 % — ABNORMAL LOW (ref 39.0–52.0)
Hemoglobin: 8.8 g/dL — ABNORMAL LOW (ref 13.0–17.0)
O2 Saturation: 89 %
Potassium: 5.4 mmol/L — ABNORMAL HIGH (ref 3.5–5.1)
Sodium: 139 mmol/L (ref 135–145)
TCO2: 24 mmol/L (ref 22–32)
pCO2, Ven: 36 mmHg — ABNORMAL LOW (ref 44–60)
pH, Ven: 7.418 (ref 7.25–7.43)
pO2, Ven: 55 mmHg — ABNORMAL HIGH (ref 32–45)

## 2023-09-29 LAB — GLUCOSE, CAPILLARY
Glucose-Capillary: 182 mg/dL — ABNORMAL HIGH (ref 70–99)
Glucose-Capillary: 131 mg/dL — ABNORMAL HIGH (ref 70–99)
Glucose-Capillary: 147 mg/dL — ABNORMAL HIGH (ref 70–99)
Glucose-Capillary: 125 mg/dL — ABNORMAL HIGH (ref 70–99)
Glucose-Capillary: 127 mg/dL — ABNORMAL HIGH (ref 70–99)
Glucose-Capillary: 95 mg/dL (ref 70–99)
Glucose-Capillary: 138 mg/dL — ABNORMAL HIGH (ref 70–99)
Glucose-Capillary: 149 mg/dL — ABNORMAL HIGH (ref 70–99)

## 2023-09-29 LAB — APTT: aPTT: 45 s — ABNORMAL HIGH (ref 24–36)

## 2023-09-29 LAB — PROTIME-INR
INR: 1.5 — ABNORMAL HIGH (ref 0.8–1.2)
Prothrombin Time: 19.4 s — ABNORMAL HIGH (ref 11.4–15.2)

## 2023-09-29 LAB — HEMOGLOBIN AND HEMATOCRIT, BLOOD
HCT: 27.6 % — ABNORMAL LOW (ref 39.0–52.0)
Hemoglobin: 9.3 g/dL — ABNORMAL LOW (ref 13.0–17.0)

## 2023-09-29 LAB — PLATELET COUNT: Platelets: 80 K/uL — ABNORMAL LOW (ref 150–400)

## 2023-09-29 LAB — ABO/RH: ABO/RH(D): B POS

## 2023-09-29 SURGERY — REPLACEMENT, MITRAL VALVE
Anesthesia: General | Site: Chest

## 2023-09-29 MED ORDER — ACETAMINOPHEN 500 MG PO TABS
1000.0000 mg | ORAL_TABLET | Freq: Four times a day (QID) | ORAL | Status: AC
  Administered 2023-09-29 – 2023-10-04 (×18): 1000 mg via ORAL
  Filled 2023-09-29 (×18): qty 2

## 2023-09-29 MED ORDER — PROTAMINE SULFATE 10 MG/ML IV SOLN
INTRAVENOUS | Status: AC
  Filled 2023-09-29: qty 25

## 2023-09-29 MED ORDER — VASOPRESSIN 20 UNIT/ML IV SOLN
INTRAVENOUS | Status: DC | PRN
  Administered 2023-09-29 (×4): 1 [IU] via INTRAVENOUS

## 2023-09-29 MED ORDER — SODIUM BICARBONATE 8.4 % IV SOLN
50.0000 meq | Freq: Once | INTRAVENOUS | Status: AC
  Administered 2023-09-29: 50 meq via INTRAVENOUS

## 2023-09-29 MED ORDER — THROMBIN 20000 UNITS EX SOLR
OROMUCOSAL | Status: DC | PRN
  Administered 2023-09-29 (×3): 4 mL via TOPICAL

## 2023-09-29 MED ORDER — HEPARIN SODIUM (PORCINE) 1000 UNIT/ML IJ SOLN
INTRAMUSCULAR | Status: AC
  Filled 2023-09-29: qty 20

## 2023-09-29 MED ORDER — LACTATED RINGERS IV SOLN: INTRAVENOUS | Status: AC

## 2023-09-29 MED ORDER — VANCOMYCIN HCL IN DEXTROSE 1-5 GM/200ML-% IV SOLN
1000.0000 mg | Freq: Once | INTRAVENOUS | Status: AC
  Administered 2023-09-29: 1000 mg via INTRAVENOUS
  Filled 2023-09-29: qty 200

## 2023-09-29 MED ORDER — LACTATED RINGERS IV SOLN: INTRAVENOUS | Status: DC

## 2023-09-29 MED ORDER — MIDAZOLAM HCL 2 MG/2ML IJ SOLN
2.0000 mg | INTRAMUSCULAR | Status: DC | PRN
  Administered 2023-09-29: 2 mg via INTRAVENOUS
  Filled 2023-09-29: qty 2

## 2023-09-29 MED ORDER — EPINEPHRINE HCL 5 MG/250ML IV SOLN IN NS: 0.0000 ug/min | INTRAVENOUS | Status: DC

## 2023-09-29 MED ORDER — BISACODYL 5 MG PO TBEC
10.0000 mg | DELAYED_RELEASE_TABLET | Freq: Every day | ORAL | Status: DC
  Administered 2023-09-30 – 2023-10-06 (×2): 10 mg via ORAL
  Filled 2023-09-29 (×6): qty 2

## 2023-09-29 MED ORDER — PLASMA-LYTE A IV SOLN
INTRAVENOUS | Status: DC | PRN
  Administered 2023-09-29: 500 mL via INTRAVASCULAR

## 2023-09-29 MED ORDER — SODIUM CHLORIDE 0.9 % IV SOLN: 250.0000 mL | INTRAVENOUS | Status: AC

## 2023-09-29 MED ORDER — NOREPINEPHRINE 4 MG/250ML-% IV SOLN: 0.0000 ug/min | INTRAVENOUS | Status: DC

## 2023-09-29 MED ORDER — FENTANYL CITRATE (PF) 250 MCG/5ML IJ SOLN
INTRAMUSCULAR | Status: AC
  Filled 2023-09-29: qty 5

## 2023-09-29 MED ORDER — PROPOFOL 10 MG/ML IV BOLUS
INTRAVENOUS | Status: AC
  Filled 2023-09-29: qty 20

## 2023-09-29 MED ORDER — INSULIN REGULAR(HUMAN) IN NACL 100-0.9 UT/100ML-% IV SOLN: INTRAVENOUS | Status: DC

## 2023-09-29 MED ORDER — OXYCODONE HCL 5 MG PO TABS
5.0000 mg | ORAL_TABLET | ORAL | Status: DC | PRN
  Administered 2023-09-29: 10 mg via ORAL
  Filled 2023-09-29: qty 2

## 2023-09-29 MED ORDER — METOCLOPRAMIDE HCL 5 MG/ML IJ SOLN
10.0000 mg | Freq: Four times a day (QID) | INTRAMUSCULAR | Status: AC
  Administered 2023-09-29 – 2023-09-30 (×6): 10 mg via INTRAVENOUS
  Filled 2023-09-29 (×6): qty 2

## 2023-09-29 MED ORDER — EPINEPHRINE HCL 5 MG/250ML IV SOLN IN NS
0.0000 ug/min | INTRAVENOUS | Status: DC
  Administered 2023-09-30: 5 ug/min via INTRAVENOUS
  Filled 2023-09-29: qty 250

## 2023-09-29 MED ORDER — ACETAMINOPHEN 160 MG/5ML PO SOLN
650.0000 mg | Freq: Once | ORAL | Status: AC
  Administered 2023-09-29: 650 mg
  Filled 2023-09-29: qty 20.3

## 2023-09-29 MED ORDER — NITROGLYCERIN 0.2 MG/ML ON CALL CATH LAB
INTRAVENOUS | Status: DC | PRN
Start: 2023-09-29 — End: 2023-09-29
  Administered 2023-09-29: 40 ug via INTRAVENOUS

## 2023-09-29 MED ORDER — SUGAMMADEX SODIUM 200 MG/2ML IV SOLN
INTRAVENOUS | Status: DC | PRN
  Administered 2023-09-29: 90 mg via INTRAVENOUS

## 2023-09-29 MED ORDER — ALBUMIN HUMAN 5 % IV SOLN: INTRAVENOUS | Status: DC | PRN

## 2023-09-29 MED ORDER — INSULIN ASPART 100 UNIT/ML IJ SOLN: 0.0000 [IU] | INTRAMUSCULAR | Status: DC

## 2023-09-29 MED ORDER — SODIUM CHLORIDE 0.9 % IV SOLN
INTRAVENOUS | Status: DC | PRN
Start: 2023-09-29 — End: 2023-09-29

## 2023-09-29 MED ORDER — CHLORHEXIDINE GLUCONATE 0.12 % MT SOLN
OROMUCOSAL | Status: AC
  Administered 2023-09-29: 15 mL via OROMUCOSAL
  Filled 2023-09-29: qty 15

## 2023-09-29 MED ORDER — ASPIRIN 81 MG PO CHEW
324.0000 mg | CHEWABLE_TABLET | Freq: Every day | ORAL | Status: DC
  Filled 2023-09-29: qty 4

## 2023-09-29 MED ORDER — LIDOCAINE HCL (CARDIAC) PF 100 MG/5ML IV SOSY
PREFILLED_SYRINGE | INTRAVENOUS | Status: DC | PRN
  Administered 2023-09-29: 60 mg via INTRAVENOUS

## 2023-09-29 MED ORDER — MIDAZOLAM HCL (PF) 5 MG/ML IJ SOLN
INTRAMUSCULAR | Status: DC | PRN
  Administered 2023-09-29 (×2): 1 mg via INTRAVENOUS
  Administered 2023-09-29: 2 mg via INTRAVENOUS

## 2023-09-29 MED ORDER — VASOPRESSIN 20 UNIT/ML IV SOLN
INTRAVENOUS | Status: AC
  Filled 2023-09-29: qty 1

## 2023-09-29 MED ORDER — ASPIRIN 325 MG PO TBEC
325.0000 mg | DELAYED_RELEASE_TABLET | Freq: Every day | ORAL | Status: DC
  Administered 2023-09-30 – 2023-10-05 (×6): 325 mg via ORAL
  Filled 2023-09-29 (×6): qty 1

## 2023-09-29 MED ORDER — PROPOFOL 10 MG/ML IV BOLUS
INTRAVENOUS | Status: DC | PRN
  Administered 2023-09-29 (×2): 10 mg via INTRAVENOUS
  Administered 2023-09-29: 20 mg via INTRAVENOUS

## 2023-09-29 MED ORDER — DOCUSATE SODIUM 100 MG PO CAPS
200.0000 mg | ORAL_CAPSULE | Freq: Every day | ORAL | Status: DC
  Administered 2023-09-30 – 2023-10-06 (×2): 200 mg via ORAL
  Filled 2023-09-29 (×6): qty 2

## 2023-09-29 MED ORDER — SODIUM CHLORIDE 0.9 % IV SOLN: INTRAVENOUS | Status: AC

## 2023-09-29 MED ORDER — METOPROLOL TARTRATE 12.5 MG HALF TABLET: 12.5000 mg | ORAL_TABLET | Freq: Two times a day (BID) | ORAL | Status: DC

## 2023-09-29 MED ORDER — SODIUM CHLORIDE 0.45 % IV SOLN: INTRAVENOUS | Status: AC | PRN

## 2023-09-29 MED ORDER — ~~LOC~~ CARDIAC SURGERY, PATIENT & FAMILY EDUCATION
Freq: Once | Status: DC
  Filled 2023-09-29: qty 1

## 2023-09-29 MED ORDER — METOPROLOL TARTRATE 25 MG/10 ML ORAL SUSPENSION: 12.5000 mg | Freq: Two times a day (BID) | ORAL | Status: DC

## 2023-09-29 MED ORDER — CHLORHEXIDINE GLUCONATE 0.12 % MT SOLN: 15.0000 mL | Freq: Once | OROMUCOSAL | Status: AC

## 2023-09-29 MED ORDER — PANTOPRAZOLE SODIUM 40 MG PO TBEC
40.0000 mg | DELAYED_RELEASE_TABLET | Freq: Every day | ORAL | Status: DC
  Administered 2023-10-01 – 2023-10-06 (×6): 40 mg via ORAL
  Filled 2023-09-29 (×6): qty 1

## 2023-09-29 MED ORDER — ESCITALOPRAM OXALATE 20 MG PO TABS
20.0000 mg | ORAL_TABLET | Freq: Every day | ORAL | Status: DC
  Administered 2023-09-29 – 2023-10-06 (×8): 20 mg via ORAL
  Filled 2023-09-29: qty 2
  Filled 2023-09-29 (×4): qty 1
  Filled 2023-09-29 (×4): qty 2

## 2023-09-29 MED ORDER — BISACODYL 10 MG RE SUPP
10.0000 mg | Freq: Every day | RECTAL | Status: DC
  Filled 2023-09-29: qty 1

## 2023-09-29 MED ORDER — CHLORHEXIDINE GLUCONATE 4 % EX SOLN: 30.0000 mL | CUTANEOUS | Status: DC

## 2023-09-29 MED ORDER — INSULIN REGULAR(HUMAN) IN NACL 100-0.9 UT/100ML-% IV SOLN
INTRAVENOUS | Status: DC | PRN
  Administered 2023-09-29: 1.8 [IU]/h via INTRAVENOUS

## 2023-09-29 MED ORDER — PROTAMINE SULFATE 10 MG/ML IV SOLN
INTRAVENOUS | Status: DC | PRN
  Administered 2023-09-29: 160 mg via INTRAVENOUS

## 2023-09-29 MED ORDER — CEFAZOLIN SODIUM-DEXTROSE 2-4 GM/100ML-% IV SOLN
2.0000 g | Freq: Three times a day (TID) | INTRAVENOUS | Status: AC
  Administered 2023-09-29 – 2023-10-01 (×6): 2 g via INTRAVENOUS
  Filled 2023-09-29 (×6): qty 100

## 2023-09-29 MED ORDER — CHLORHEXIDINE GLUCONATE 0.12 % MT SOLN
15.0000 mL | OROMUCOSAL | Status: AC
  Filled 2023-09-29: qty 15

## 2023-09-29 MED ORDER — ACETAMINOPHEN 160 MG/5ML PO SOLN: 1000.0000 mg | Freq: Four times a day (QID) | ORAL | Status: AC

## 2023-09-29 MED ORDER — MORPHINE SULFATE (PF) 2 MG/ML IV SOLN
1.0000 mg | INTRAVENOUS | Status: DC | PRN
  Administered 2023-09-29: 4 mg via INTRAVENOUS
  Filled 2023-09-29: qty 1
  Filled 2023-09-29: qty 2

## 2023-09-29 MED ORDER — THROMBIN (RECOMBINANT) 20000 UNITS EX SOLR
CUTANEOUS | Status: AC
  Filled 2023-09-29: qty 20000

## 2023-09-29 MED ORDER — 0.9 % SODIUM CHLORIDE (POUR BTL) OPTIME
TOPICAL | Status: DC | PRN
  Administered 2023-09-29: 6000 mL

## 2023-09-29 MED ORDER — MIDAZOLAM HCL (PF) 10 MG/2ML IJ SOLN
INTRAMUSCULAR | Status: AC
Start: 2023-09-29 — End: 2023-09-29
  Filled 2023-09-29: qty 2

## 2023-09-29 MED ORDER — VASOPRESSIN 20 UNITS/100 ML INFUSION FOR SHOCK
INTRAVENOUS | Status: DC | PRN
Start: 2023-09-29 — End: 2023-09-29
  Administered 2023-09-29: .02 [IU]/min via INTRAVENOUS

## 2023-09-29 MED ORDER — SODIUM CHLORIDE 0.9% FLUSH
3.0000 mL | Freq: Two times a day (BID) | INTRAVENOUS | Status: DC
  Administered 2023-09-30 – 2023-10-06 (×12): 3 mL via INTRAVENOUS

## 2023-09-29 MED ORDER — ALBUTEROL SULFATE (2.5 MG/3ML) 0.083% IN NEBU: 3.0000 mL | INHALATION_SOLUTION | Freq: Four times a day (QID) | RESPIRATORY_TRACT | Status: DC | PRN

## 2023-09-29 MED ORDER — MILRINONE LACTATE IN DEXTROSE 20-5 MG/100ML-% IV SOLN
0.1250 ug/kg/min | INTRAVENOUS | Status: DC
  Administered 2023-09-30: 0.25 ug/kg/min via INTRAVENOUS
  Administered 2023-10-01: 0.125 ug/kg/min via INTRAVENOUS
  Filled 2023-09-29 (×2): qty 100

## 2023-09-29 MED ORDER — BICTEGRAVIR-EMTRICITAB-TENOFOV 50-200-25 MG PO TABS
1.0000 | ORAL_TABLET | Freq: Every day | ORAL | Status: DC
  Administered 2023-09-30 – 2023-10-06 (×7): 1 via ORAL
  Filled 2023-09-29 (×7): qty 1

## 2023-09-29 MED ORDER — ORAL CARE MOUTH RINSE: 15.0000 mL | OROMUCOSAL | Status: DC | PRN

## 2023-09-29 MED ORDER — SODIUM CHLORIDE 0.9% FLUSH
3.0000 mL | INTRAVENOUS | Status: DC | PRN
Start: 2023-09-30 — End: 2023-10-06

## 2023-09-29 MED ORDER — ALBUMIN HUMAN 5 % IV SOLN
250.0000 mL | INTRAVENOUS | Status: DC | PRN
  Administered 2023-09-29 (×4): 12.5 g via INTRAVENOUS
  Filled 2023-09-29 (×2): qty 250

## 2023-09-29 MED ORDER — METOPROLOL TARTRATE 12.5 MG HALF TABLET
ORAL_TABLET | ORAL | Status: AC
  Administered 2023-09-29: 12.5 mg via ORAL
  Filled 2023-09-29: qty 1

## 2023-09-29 MED ORDER — TRAMADOL HCL 50 MG PO TABS
50.0000 mg | ORAL_TABLET | ORAL | Status: DC | PRN
  Administered 2023-09-29 – 2023-09-30 (×2): 50 mg via ORAL
  Filled 2023-09-29 (×2): qty 1

## 2023-09-29 MED ORDER — CHLORHEXIDINE GLUCONATE 0.12 % MT SOLN
15.0000 mL | Freq: Once | OROMUCOSAL | Status: AC
  Administered 2023-09-29: 15 mL via OROMUCOSAL

## 2023-09-29 MED ORDER — HEMOSTATIC AGENTS (NO CHARGE) OPTIME
TOPICAL | Status: DC | PRN
  Administered 2023-09-29: 1 via TOPICAL

## 2023-09-29 MED ORDER — FENTANYL CITRATE (PF) 100 MCG/2ML IJ SOLN
INTRAMUSCULAR | Status: DC | PRN
  Administered 2023-09-29: 50 ug via INTRAVENOUS
  Administered 2023-09-29 (×2): 100 ug via INTRAVENOUS
  Administered 2023-09-29: 50 ug via INTRAVENOUS
  Administered 2023-09-29 (×2): 100 ug via INTRAVENOUS

## 2023-09-29 MED ORDER — ASPIRIN 81 MG PO CHEW
324.0000 mg | CHEWABLE_TABLET | Freq: Once | ORAL | Status: AC
  Administered 2023-09-29: 324 mg via ORAL
  Filled 2023-09-29: qty 4

## 2023-09-29 MED ORDER — HEPARIN SODIUM (PORCINE) 1000 UNIT/ML IJ SOLN
INTRAMUSCULAR | Status: DC | PRN
  Administered 2023-09-29: 16000 [IU] via INTRAVENOUS

## 2023-09-29 MED ORDER — MILRINONE LACTATE IN DEXTROSE 20-5 MG/100ML-% IV SOLN: 0.3000 ug/kg/min | INTRAVENOUS | Status: DC

## 2023-09-29 MED ORDER — DEXTROSE 50 % IV SOLN: 0.0000 mL | INTRAVENOUS | Status: DC | PRN

## 2023-09-29 MED ORDER — ROCURONIUM BROMIDE 100 MG/10ML IV SOLN
INTRAVENOUS | Status: DC | PRN
  Administered 2023-09-29 (×2): 30 mg via INTRAVENOUS
  Administered 2023-09-29: 60 mg via INTRAVENOUS
  Administered 2023-09-29: 20 mg via INTRAVENOUS

## 2023-09-29 MED ORDER — METOPROLOL TARTRATE 5 MG/5ML IV SOLN: 2.5000 mg | INTRAVENOUS | Status: DC | PRN

## 2023-09-29 MED ORDER — METOPROLOL TARTRATE 12.5 MG HALF TABLET: 12.5000 mg | ORAL_TABLET | Freq: Once | ORAL | Status: AC

## 2023-09-29 MED ORDER — ONDANSETRON HCL 4 MG/2ML IJ SOLN: 4.0000 mg | Freq: Four times a day (QID) | INTRAMUSCULAR | Status: DC | PRN

## 2023-09-29 MED ORDER — POTASSIUM CHLORIDE 10 MEQ/50ML IV SOLN
10.0000 meq | INTRAVENOUS | Status: AC
  Administered 2023-09-29 (×3): 10 meq via INTRAVENOUS

## 2023-09-29 MED ORDER — CHLORHEXIDINE GLUCONATE 0.12 % MT SOLN: 15.0000 mL | Freq: Once | OROMUCOSAL | Status: DC

## 2023-09-29 MED ORDER — LACTATED RINGERS IV SOLN: INTRAVENOUS | Status: DC | PRN

## 2023-09-29 MED ORDER — PANTOPRAZOLE SODIUM 40 MG IV SOLR
40.0000 mg | Freq: Every day | INTRAVENOUS | Status: AC
  Administered 2023-09-29 – 2023-09-30 (×2): 40 mg via INTRAVENOUS
  Filled 2023-09-29 (×2): qty 10

## 2023-09-29 MED ORDER — CHLORHEXIDINE GLUCONATE CLOTH 2 % EX PADS
6.0000 | MEDICATED_PAD | Freq: Every day | CUTANEOUS | Status: DC
  Administered 2023-09-29 – 2023-10-05 (×6): 6 via TOPICAL

## 2023-09-29 MED ORDER — EPINEPHRINE 1 MG/10ML IJ SOSY
PREFILLED_SYRINGE | INTRAMUSCULAR | Status: DC | PRN
  Administered 2023-09-29 (×3): 4 ug via INTRAVENOUS

## 2023-09-29 MED ORDER — DEXMEDETOMIDINE HCL IN NACL 400 MCG/100ML IV SOLN: 0.0000 ug/kg/h | INTRAVENOUS | Status: DC

## 2023-09-29 MED ORDER — MAGNESIUM SULFATE 4 GM/100ML IV SOLN
4.0000 g | Freq: Once | INTRAVENOUS | Status: AC
  Administered 2023-09-29: 4 g via INTRAVENOUS
  Filled 2023-09-29: qty 100

## 2023-09-29 SURGICAL SUPPLY — 82 items
ADAPTER CARDIO PERF ANTE/RETRO (ADAPTER) ×3 IMPLANT
ATTRACTOMAT 16X20 MAGNETIC DRP (DRAPES) ×3 IMPLANT
BAG DECANTER FOR FLEXI CONT (MISCELLANEOUS) ×3 IMPLANT
BLADE CLIPPER SURG (BLADE) ×3 IMPLANT
BLADE STERNUM SYSTEM 6 (BLADE) ×3 IMPLANT
BLADE SURG 11 STRL SS (BLADE) IMPLANT
BLADE SURG 15 STRL LF DISP TIS (BLADE) ×3 IMPLANT
CANISTER SUCTION 3000ML PPV (SUCTIONS) ×3 IMPLANT
CANNULA AORTIC ROOT 9FR (CANNULA) ×3 IMPLANT
CANNULA ARTERIAL VENT 3/8 20FR (CANNULA) IMPLANT
CANNULA GUNDRY RCSP 15FR (MISCELLANEOUS) ×3 IMPLANT
CANNULA PRFSN 3/8XCNCT RT ANG (MISCELLANEOUS) IMPLANT
CANNULA SUMP PERICARDIAL (CANNULA) ×3 IMPLANT
CANNULA VRC MALB SNGL STG 34FR (MISCELLANEOUS) IMPLANT
CATH ROBINSON RED A/P 18FR (CATHETERS) ×9 IMPLANT
CATH THORACIC 36FR (CATHETERS) ×3 IMPLANT
CATH THORACIC 36FR RT ANG (CATHETERS) ×3 IMPLANT
CNTNR URN SCR LID CUP LEK RST (MISCELLANEOUS) IMPLANT
CONN 1/2X1/2X1/2 BEN (MISCELLANEOUS) ×3 IMPLANT
CONN ST 3/8 X 1/2 (MISCELLANEOUS) ×6 IMPLANT
CONTAINER PROTECT SURGISLUSH (MISCELLANEOUS) ×6 IMPLANT
COUNTER NDL 20CT MAGNET RED (NEEDLE) IMPLANT
COVER SURGICAL LIGHT HANDLE (MISCELLANEOUS) ×6 IMPLANT
DEVICE ATRICLIP LAA PRCLPII 45 (Clip) IMPLANT
DEVICE SUT CK QUICK LOAD MINI (Prosthesis & Implant Heart) IMPLANT
DRAPE SRG 135X102X78XABS (DRAPES) ×3 IMPLANT
DRAPE WARM FLUID 44X44 (DRAPES) ×3 IMPLANT
DRSG COVADERM 4X14 (GAUZE/BANDAGES/DRESSINGS) ×3 IMPLANT
ELECT CAUTERY BLADE 6.4 (BLADE) ×3 IMPLANT
ELECTRODE REM PT RTRN 9FT ADLT (ELECTROSURGICAL) ×6 IMPLANT
FELT TEFLON 1X6 (MISCELLANEOUS) ×6 IMPLANT
GAUZE 4X4 16PLY ~~LOC~~+RFID DBL (SPONGE) ×3 IMPLANT
GAUZE SPONGE 4X4 12PLY STRL (GAUZE/BANDAGES/DRESSINGS) ×6 IMPLANT
GLOVE BIO SURGEON STRL SZ 6 (GLOVE) IMPLANT
GLOVE BIO SURGEON STRL SZ 6.5 (GLOVE) IMPLANT
GLOVE BIO SURGEON STRL SZ7 (GLOVE) IMPLANT
GLOVE BIO SURGEON STRL SZ7.5 (GLOVE) IMPLANT
GLOVE SURG MICRO LTX SZ7 (GLOVE) ×6 IMPLANT
GOWN STRL REUS W/ TWL LRG LVL3 (GOWN DISPOSABLE) ×12 IMPLANT
GOWN STRL REUS W/ TWL XL LVL3 (GOWN DISPOSABLE) ×3 IMPLANT
HEMOSTAT POWDER SURGIFOAM 1G (HEMOSTASIS) ×9 IMPLANT
HEMOSTAT SURGICEL 2X14 (HEMOSTASIS) ×3 IMPLANT
INSERT FOGARTY XLG (MISCELLANEOUS) IMPLANT
KIT BASIN OR (CUSTOM PROCEDURE TRAY) ×3 IMPLANT
KIT SUCTION CATH 14FR (SUCTIONS) ×3 IMPLANT
KIT SUT CK MINI COMBO 4X17 (Prosthesis & Implant Heart) IMPLANT
KIT TURNOVER KIT B (KITS) ×3 IMPLANT
LINE VENT (MISCELLANEOUS) IMPLANT
LOOP VASCLR EXTRA MAXI WHITE (MISCELLANEOUS) ×3 IMPLANT
LOOPS VASCLR EXTRA MAXI WHITE (MISCELLANEOUS) ×3 IMPLANT
NS IRRIG 1000ML POUR BTL (IV SOLUTION) ×15 IMPLANT
PACK E OPEN HEART (SUTURE) ×3 IMPLANT
PACK OPEN HEART (CUSTOM PROCEDURE TRAY) ×3 IMPLANT
PAD ARMBOARD POSITIONER FOAM (MISCELLANEOUS) ×6 IMPLANT
POSITIONER HEAD DONUT 9IN (MISCELLANEOUS) ×3 IMPLANT
RING TRICUSPID T30 (Prosthesis & Implant Heart) IMPLANT
SENSOR MYOCARDIAL TEMP (MISCELLANEOUS) IMPLANT
SET MPS 3-ND DEL (MISCELLANEOUS) IMPLANT
SPONGE T-LAP 18X18 ~~LOC~~+RFID (SPONGE) ×12 IMPLANT
SPONGE T-LAP 4X18 ~~LOC~~+RFID (SPONGE) ×3 IMPLANT
SUT BONE WAX W31G (SUTURE) ×3 IMPLANT
SUT ETHIBOND 2 0 SH (SUTURE) IMPLANT
SUT ETHIBOND 2 0 SH 36X2 (SUTURE) ×3 IMPLANT
SUT ETHIBOND 2 0 V4 (SUTURE) IMPLANT
SUT ETHIBOND 2 0V4 GREEN (SUTURE) IMPLANT
SUT PROLENE 3 0 SH 1 (SUTURE) ×3 IMPLANT
SUT PROLENE 3 0 SH DA (SUTURE) IMPLANT
SUT PROLENE 4-0 RB1 .5 CRCL 36 (SUTURE) ×6 IMPLANT
SUT SILK 2 0 SH CR/8 (SUTURE) IMPLANT
SUT STEEL 6MS V (SUTURE) IMPLANT
SUT VIC AB 1 CTX 27 (SUTURE) ×6 IMPLANT
SUTURE EB EXC GRN/WHT 2-0 D/A (SUTURE) IMPLANT
SYSTEM SAHARA CHEST DRAIN ATS (WOUND CARE) ×3 IMPLANT
TAPE CLOTH SURG 4X10 WHT LF (GAUZE/BANDAGES/DRESSINGS) IMPLANT
TAPE PAPER 2X10 WHT MICROPORE (GAUZE/BANDAGES/DRESSINGS) IMPLANT
TOWEL GREEN STERILE (TOWEL DISPOSABLE) ×3 IMPLANT
TOWEL GREEN STERILE FF (TOWEL DISPOSABLE) ×3 IMPLANT
TRAY FOLEY SLVR 16FR TEMP STAT (SET/KITS/TRAYS/PACK) ×3 IMPLANT
TUBE SUCT INTRACARD DLP 20F (MISCELLANEOUS) ×3 IMPLANT
UNDERPAD 30X36 HEAVY ABSORB (UNDERPADS AND DIAPERS) ×3 IMPLANT
VALVE MITRAL SZ 31 (Prosthesis & Implant Heart) IMPLANT
WATER STERILE IRR 1000ML POUR (IV SOLUTION) ×6 IMPLANT

## 2023-09-29 NOTE — Brief Op Note (Signed)
 09/29/2023  8:12 AM  PATIENT:  Christopher Farrell  63 y.o. male  PRE-OPERATIVE DIAGNOSIS:  severe Mitral Regurgitation and Tricuspid Regurgitation  POST-OPERATIVE DIAGNOSIS:  severe Mitral Regurgitation and Tricuspid Regurgitation  PROCEDURE:  Procedure(s):  MITRAL VALVE ENDOCARDITIS -31 mm Medtronic Mosaic Valve  TRICUSPID VALVE REPAIR  -30 mm Edwards MC3 Ring  CLIPPING OF LEFT ATRIAL APPENDAGE -45 mm Atricure Clip Pro 2  ECHOCARDIOGRAM, TRANSESOPHAGEAL (N/A)  SURGEON:  Surgeons and Role:    * Lucas Dorise POUR, MD - Primary  PHYSICIAN ASSISTANT: Rocky Shad PA-C  ASSISTANTS: Jerel Fees RNFA   ANESTHESIA:   general  EBL:  Per Record  BLOOD ADMINISTERED:  CC CELLSAVER and 1 pack PLTS  DRAINS: Mediastinal Chest Drains   LOCAL MEDICATIONS USED:  NONE  SPECIMEN:  Source of Specimen:  Mitral Valve Leaflets  DISPOSITION OF SPECIMEN:  Microbiology, Pathology  COUNTS:  YES  TOURNIQUET:  * No tourniquets in log *  DICTATION: .Dragon Dictation  PLAN OF CARE: Admit to inpatient   PATIENT DISPOSITION:  ICU - intubated and hemodynamically stable.   Delay start of Pharmacological VTE agent (>24hrs) due to surgical blood loss or risk of bleeding: yes

## 2023-09-29 NOTE — Progress Notes (Signed)
 Patient ID: Christopher Farrell, male   DOB: 1960-06-03, 63 y.o.   MRN: [id number]   TCTS Evening Rounds:   Hemodynamically stable in sinus rhythm. CI = 2.7 on milrinone  0.25 and epi 4  Has started to wake up on vent. Weaning.   Urine output good  CT output low  CBC    Component Value Date/Time   WBC 12.4 (H) 09/29/2023 1310   RBC 2.96 (L) 09/29/2023 1310   HGB 9.2 (L) 09/29/2023 1312   HGB 13.4 05/02/2023 1005   HCT 27.0 (L) 09/29/2023 1312   HCT 40.4 05/02/2023 1005   PLT 105 (L) 09/29/2023 1310   PLT 148 (L) 05/02/2023 1005   MCV 97.3 09/29/2023 1310   MCV 96 05/02/2023 1005   MCH 32.1 09/29/2023 1310   MCHC 33.0 09/29/2023 1310   RDW 13.4 09/29/2023 1310   RDW 13.0 05/02/2023 1005   LYMPHSABS 3.1 10/01/2022 0408   LYMPHSABS 1.7 06/29/2021 1010   MONOABS 0.7 10/01/2022 0408   EOSABS 0.3 10/01/2022 0408   EOSABS 0.0 06/29/2021 1010   BASOSABS 0.1 10/01/2022 0408   BASOSABS 0.1 06/29/2021 1010     BMET    Component Value Date/Time   NA 143 09/29/2023 1312   NA 144 05/02/2023 1005   K 3.8 09/29/2023 1312   CL 106 09/29/2023 1156   CO2 26 09/28/2023 1015   GLUCOSE 198 (H) 09/29/2023 1156   GLUCOSE 107 07/31/2008 0000   BUN 11 09/29/2023 1156   BUN 14 05/02/2023 1005   CREATININE 0.80 09/29/2023 1156   CREATININE 0.85 12/16/2022 1047   CALCIUM  9.5 09/28/2023 1015   EGFR 97 05/02/2023 1005   GFRNONAA >60 09/28/2023 1015   GFRNONAA 97 05/21/2020 1414     A/P:  Stable postop course. Continue current plans

## 2023-09-29 NOTE — Anesthesia Procedure Notes (Signed)
 Central Venous Catheter Insertion Performed by: Erma Thom SAUNDERS, MD, anesthesiologist Start/End7/25/2025 6:45 AM, 09/29/2023 7:00 AM Patient location: Pre-op. Preanesthetic checklist: patient identified, IV checked, site marked, risks and benefits discussed, surgical consent, monitors and equipment checked, pre-op evaluation, timeout performed and anesthesia consent Position: Trendelenburg Lidocaine  1% used for infiltration and patient sedated Hand hygiene performed , maximum sterile barriers used  and Seldinger technique used Catheter size: 8.5 Fr Total catheter length 10. Central line was placed.MAC introducer Swan type:thermodilution PA Cath depth:50 Procedure performed using ultrasound guided technique. Ultrasound Notes:anatomy identified, needle tip was noted to be adjacent to the nerve/plexus identified, no ultrasound evidence of intravascular and/or intraneural injection and image(s) printed for medical record Attempts: 1 Following insertion, line sutured and dressing applied. Post procedure assessment: blood return through all ports, free fluid flow and no air  Patient tolerated the procedure well with no immediate complications.

## 2023-09-29 NOTE — Op Note (Signed)
 CARDIOVASCULAR SURGERY OPERATIVE NOTE  09/29/23  Surgeon:  Dorise LOIS Fellers, MD  First Assistant: Kyla Donald, PA-C: An experienced assistant was required given the complexity of this surgery and the standard of surgical care. The assistant was needed for exposure, dissection, suctioning, retraction of delicate tissues and sutures, instrument exchange and for overall help during this procedure.    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  Postoperative Diagnosis:  Same   Procedure:    Median Sternotomy Extracorporeal circulation Mitral valve replacement using a 31 mm Medtronic Mosaic porcine valve.   4.    Tricuspid valve repair using a 30 mm Edwards MC3 annuloplasty ring.   5.    Clipping of left atrial appendage.    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 Cardiac catheterization in August 2024 showed severe pulmonary hypertension and elevated CVP. He was seen by Dr. Maryjane at that time and plans were made for outpatient followup after antibiotic completion. He apparently no-showed several times. He had an echocardiogram on 01/19/2023 showing severe mitral regurgitation due to prolapse/flail of the anterior mitral leaflet. There is also moderate to severe tricuspid valve regurgitation with hepatic vein flow reversal. Right ventricular systolic function was at least moderately reduced with moderate RV enlargement. PA systolic pressure was estimated at 68 mmHg. Left ventricular ejection fraction was 60 to 65%. An echocardiogram in March 2025 and showed normal left ventricular systolic function but moderate RV dilation and moderate systolic dysfunction. He has evidence of right heart failure on exam with moderate  bilateral lower extremity edema. I saw him in the office for the first time on 08/02/2023 and thought he would require mitral and tricuspid valve replacement using bioprosthetic valves. A repeat echo on 08/31/2023 showed an EF of 55-60% with moderate RV enlargement and mild to moderate systolic dysfunction with evidence of severe pulmonary HTN. There was severe MR and severe TR.  I discussed the operative procedure with the patient and his mother including alternatives, benefits and risks; including but not limited to bleeding, blood transfusion, infection, stroke, myocardial infarction, graft failure, heart block requiring a permanent pacemaker, organ dysfunction, and death.  Vinie ONEIDA Pouch understands and agrees to proceed.    Preparation:  The patient was seen in the preoperative holding area and the correct patient, correct operation were confirmed with the patient after reviewing the medical record and catheterization. The consent was signed by me. Preoperative antibiotics were given. A pulmonary arterial line and radial arterial line were placed by the anesthesia team. PA pressure was systemic. The patient was taken back to the operating room and positioned supine on the operating room table. After being placed under general endotracheal anesthesia by the anesthesia team a foley catheter was placed. The neck, chest, abdomen, and both legs were prepped with betadine soap and solution and draped in the usual sterile manner. A surgical time-out was taken and the correct patient and operative procedure were confirmed with the nursing and anesthesia staff.   TEE:  Performed by Dr. Norleen Peoples. This showed severe MR with a thickened and shaggy appearing anterior leaflet with prolapse/flail segment. LV systolic function normal. There was moderate to severe TR with annular dilation and moderate RV enlargement and systolic dysfunction. The aortic valve was fine.   Cardiopulmonary Bypass:  The patient was  hypotensive this morning  with a systolic BP in the 80's and systemic PA pressure in the 80's/40's. A median sternotomy was performed. The pericardium was opened in the midline. Right ventricular function appeared moderately depressed.  The ascending aorta was of normal size and had no palpable plaque. There were no contraindications to aortic cannulation or cross-clamping. The patient was fully systemically heparinized and the ACT was maintained > 400 sec. The proximal aortic arch was cannulated with a 20 F aortic cannula for arterial inflow. Venous outflow was performed via bicaval cannulation with a 28 F metal tipped cannula in the SVC and a 34 F malleable plastic cannula in the IVC.  The cavae were encircled with tapes. An antegrade cardioplegia/vent cannula was inserted into the mid-ascending aorta. Aortic occlusion was performed with a single cross-clamp. Systemic cooling to 32 degrees Centigrade and topical cooling of the heart with iced saline were used. Cold antegrade KBC cardioplegia was used to induce diastolic arrest and was then given retrograde at about 60 minute intervals throughout the period of arrest to maintain myocardial temperature at or below 10 degrees centigrade. A temperature probe was inserted into the interventricular septum and an insulating pad was placed in the pericardium. CO2 was insufflated into the pericardium to minimize intracardiac air.   Mitral Valve Replacement: Assisted by Kyla Donald, PA-C  The left atrium was opened through a vertical incision in the interatrial groove. Exposure was good. Valve inspection showed a thickened, deformed and retracted anterior leaflet with some vegetation present.  The posterior leaflet was unremarkable. The anterior leaflet was excised. A piece with vegetation was sent for routine culture. The posterior leaflet was left intact.  A series of pledgetted 2-0 Ethibond sutures were placed around the mitral annulus. A 31mm Medtronic Mosaic  porcine valve was chosen ( model 310, [place] [id number]). The sutures were placed through the sewing ring and it was lowered into place. The sutures were tied using CorKnots. The valve was tested with saline and there was no central regurgitation. The atrium was closed with 2 layers of continuous 3-0 prolene suture.    Ligation of left atrial appendage: Assisted by Kyla Donald, PA-C  The base of the appendage was measured and a 45 mm Atricure Atriclip Pro2 was chosen. This was placed across the base of the LAA without difficulty.     Tricuspid Valve Repair: Assisted by Kyla Donald, PA-C  The caval tapes were tightened. The right atrium was opened obliquely. A Koros retractor was used to expose the valve and exposure was excellent. The valve leaflets looked normal with no vegetation. The annulus was dilated and it appeared to be lack of coaptation causing the regurgitation. The anterior leaflet was sized and a 30 mm Edwards MC3 ring chosen. This had model number T30 and serial number [id number]. Then  2-0 Ethibond sutures were placed around the annulus. The sutures were placed through the ring and the valve lowered into place. The sutures were tied using CorKnots.  The atrium was closed in two layers with 4-0 prolene suture.   Completion:  The patient was rewarmed to 37 degrees Centigrade. A dose of warm antegrade reanimation cardioplegia was given. Deairing maneuvers were performed and the head placed in trendelenburg position. The crossclamp was removed with a time of 126 minutes. There was spontaneous return of slow junctional rhythm. Two temporary epicardial pacing wires were placed on the right atrium and two on the right ventricle. The patient was weaned from CPB without difficulty on milrinone  and epi.  CPB time  was 154 minutes. TEE showed a normally functioning tricuspid valve with trivial central regurgitation around the PA catheter. The mitral valve prosthesis was functioning normally with  no paravalvular leak or central regurgitation. Heparin  was fully reversed with protamine and the aortic and venous cannulas removed. Hemostasis was achieved. Mediastinal drainage tube was placed. The sternum was closed with #6 stainless steel wires. The fascia was closed with continuous # 1 vicryl suture. The subcutaneous tissue was closed with 2-0 vicryl continuous suture. The skin was closed with 3-0 vicryl subcuticular suture. All sponge, needle, and instrument counts were reported correct at the end of the case. Dry sterile dressings were placed over the incisions and around the chest tubes which were connected to pleurevac suction. The patient was then transported to the surgical intensive care unit in stable condition.

## 2023-09-29 NOTE — Procedures (Signed)
 Extubation Procedure Note  Patient Details:   Name: Christopher Farrell DOB: 02/23/1961 MRN: [id number]   Airway Documentation:    Vent end date: 09/29/23 Vent end time: 1755   Evaluation  O2 sats: stable throughout Complications: No apparent complications Patient did tolerate procedure well. Bilateral Breath Sounds: Clear, Diminished   Yes  Pt extubated per rapid wean protocol to 2L [name]. Pt passed all parameters with a NIF -24 and a VC 1.4L. Pt had a positive cuff leak, able to state name, good cough and no stridor to be noted.   Christopher Farrell 09/29/2023, 6:01 PM

## 2023-09-29 NOTE — Anesthesia Procedure Notes (Signed)
 Procedure Name: Intubation Date/Time: 09/29/2023 7:42 AM  Performed by: Lamar Lucie DASEN, CRNAPre-anesthesia Checklist: Patient identified, Emergency Drugs available, Suction available and Patient being monitored Patient Re-evaluated:Patient Re-evaluated prior to induction Oxygen Delivery Method: Circle system utilized Preoxygenation: Pre-oxygenation with 100% oxygen Induction Type: IV induction Ventilation: Mask ventilation without difficulty Laryngoscope Size: Mac and 3 Grade View: Grade I Tube type: Oral Tube size: 8.0 mm Number of attempts: 1 Airway Equipment and Method: Stylet and Oral airway Placement Confirmation: ETT inserted through vocal cords under direct vision, positive ETCO2 and breath sounds checked- equal and bilateral Secured at: 21 cm Tube secured with: Tape Dental Injury: Teeth and Oropharynx as per pre-operative assessment

## 2023-09-29 NOTE — Hospital Course (Addendum)
 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  TEE showed a large mitral valve vegetation with severe mitral regurgitation.  He was seen by Dr. Maryjane at that time and plans were made for outpatient follow-up after completing his antibiotics.  He apparently no showed several times.  He had an echocardiogram on 01/19/2023 showing severe mitral regurgitation due to prolapse/flail of the anterior mitral leaflet.  There is also moderate to severe tricuspid valve regurgitation with hepatic vein flow reversal.  Right ventricular systolic function was at least moderately reduced with moderate RV enlargement.  PA systolic pressure was estimated at 68 mmHg.  Left ventricular ejection fraction was 60 to 65%.  His most recent echo on 05/16/2023 showed a thickened and shaggy anterior mitral valve leaflet with prolapse of the anterior leaflet and severe mitral regurgitation.  There was also severe tricuspid regurgitation.  Right ventricular systolic function was moderately reduced.  Left ventricular ejection fraction was estimated at 58%.  There was grade 3 diastolic dysfunction.  There was no aortic valve disease.   He recently had his remaining maxillary teeth extracted by Dr. Celena.  He reports continued exertional shortness of breath and fatigue as well as some orthopnea.  He has had lower extremity edema.  He has not been eating very well and lost some weight due to his extractions.  Dr. Lucas reviewed the patient's diagnostic studies and determined he would benefit from surgical intervention. He reviewed the treatment options as well as the risks and benefits of surgery with the patient. Mr. Nauta was agreeable to proceed with surgery.  Hospital Course: Mr. Dunlevy presented to the  [facility] on 09/29/23 and underwent Mitral Valve Replacement with a 31 mm Mosaic Bioprosthetic Valve and Tricuspid Valve Repair with a 30 mm MC3 Ring Annuloplasty.  He tolerated the procedure well and was transferred to the SICU in stable condition. He was weaned off Milrinone  and Epinephrine  drips. Norva Purl, a line and foley were removed. He was above his pre op weight prior to surgery and was diuresed accordingly. Advanced heart failure was consulted for pulmonary hypertension. He appeared to have junctional rhythm. He was not put on a beta blocker. Per Dr. Lucas, Coumadin will not be used as patient likely will not be reliable but he will be on ec asa. He was transitioned off the Insulin  drip. His pre op HGA1C was 5.7. He likely has pre diabetes.

## 2023-09-29 NOTE — Interval H&P Note (Signed)
 History and Physical Interval Note:  09/29/2023 6:39 AM  Christopher Farrell  has presented today for surgery, with the diagnosis of severe MR/TR.  The various methods of treatment have been discussed with the patient and family. After consideration of risks, benefits and other options for treatment, the patient has consented to  Procedure(s) with comments: REPLACEMENT, MITRAL VALVE (N/A) TRICUSPID VALVE REPAIR (N/A) REPLACEMENT, TRICUSPID VALVE (N/A) - POSSIBLE ECHOCARDIOGRAM, TRANSESOPHAGEAL (N/A) as a surgical intervention.  The patient's history has been reviewed, patient examined, no change in status, stable for surgery.  I have reviewed the patient's chart and labs.  Questions were answered to the patient's satisfaction.     Yuta Cipollone K Giorgia Wahler

## 2023-09-29 NOTE — Anesthesia Procedure Notes (Signed)
 Arterial Line Insertion Start/End7/25/2025 7:00 AM, 09/29/2023 7:10 AM Performed by: Erma Thom SAUNDERS, MD, anesthesiologist  Patient location: Pre-op. Preanesthetic checklist: patient identified, IV checked, site marked, risks and benefits discussed, surgical consent, monitors and equipment checked, pre-op evaluation, timeout performed and anesthesia consent Lidocaine  1% used for infiltration Left, radial was placed Catheter size: 20 G Hand hygiene performed  and maximum sterile barriers used   Attempts: 1 Procedure performed using ultrasound guided technique. Ultrasound Notes:anatomy identified, needle tip was noted to be adjacent to the nerve/plexus identified and no ultrasound evidence of intravascular and/or intraneural injection Following insertion, dressing applied. Post procedure assessment: normal and unchanged  Patient tolerated the procedure well with no immediate complications.

## 2023-09-29 NOTE — Anesthesia Postprocedure Evaluation (Signed)
 Anesthesia Post Note  Patient: Christopher Farrell  Procedure(s) Performed: REPLACEMENT, MITRAL VALVE USING MEDTRONIC MOSAIC PORCINE HEART VALVE (Chest) TRICUSPID VALVE REPAIR USING EDWARDS MC3 ANNULOPLASTY RING (Chest) ECHOCARDIOGRAM, TRANSESOPHAGEAL CLIPPING, LEFT ATRIAL APPENDAGE USING ATRICURE ATRICLIP PRO2     Patient location during evaluation: SICU Anesthesia Type: General Level of consciousness: sedated Pain management: pain level controlled Vital Signs Assessment: post-procedure vital signs reviewed and stable Respiratory status: patient remains intubated per anesthesia plan Cardiovascular status: stable Postop Assessment: no apparent nausea or vomiting Anesthetic complications: no   No notable events documented.  Last Vitals:  Vitals:   09/29/23 0714 09/29/23 0715  BP:    Pulse:    Resp: (!) 21 16  Temp:    SpO2:      Last Pain:  Vitals:   09/29/23 0548  TempSrc: Oral                 Nissan Frazzini R Malaney Mcbean

## 2023-09-29 NOTE — Transfer of Care (Signed)
 Immediate Anesthesia Transfer of Care Note  Patient: Christopher Farrell  Procedure(s) Performed: REPLACEMENT, MITRAL VALVE USING MEDTRONIC MOSAIC PORCINE HEART VALVE (Chest) TRICUSPID VALVE REPAIR USING EDWARDS MC3 ANNULOPLASTY RING (Chest) ECHOCARDIOGRAM, TRANSESOPHAGEAL CLIPPING, LEFT ATRIAL APPENDAGE USING ATRICURE ATRICLIP PRO2  Patient Location: ICU  Anesthesia Type:General  Level of Consciousness: sedated and Patient remains intubated per anesthesia plan  Airway & Oxygen Therapy: Patient remains intubated per anesthesia plan and Patient placed on Ventilator (see vital sign flow sheet for setting)  Post-op Assessment: Report given to RN and Post -op Vital signs reviewed and stable  Post vital signs: Reviewed and stable  Last Vitals:  Vitals Value Taken Time  BP 102/68   Temp 36.1 C 09/29/23 13:09  Pulse 85 09/29/23 13:09  Resp 12 09/29/23 13:09  SpO2 100 % 09/29/23 13:09  Vitals shown include unfiled device data.  Last Pain:  Vitals:   09/29/23 0548  TempSrc: Oral         Complications: No notable events documented.  Patient transported to ICU with standard monitors (HR, BP, SPO2, RR) and emergency drugs/equipment. Controlled ventilation maintained via ambu bag. Report given to bedside RN and respiratory therapist. Pt connected to ICU monitor and ventilator. All questions answered and vital signs stable before leaving

## 2023-09-29 NOTE — Progress Notes (Signed)
  Echocardiogram Echocardiogram Transesophageal has been performed.  Devora Ellouise SAUNDERS 09/29/2023, 10:23 AM

## 2023-09-30 ENCOUNTER — Inpatient Hospital Stay (HOSPITAL_COMMUNITY)

## 2023-09-30 DIAGNOSIS — Z952 Presence of prosthetic heart valve: Secondary | ICD-10-CM

## 2023-09-30 DIAGNOSIS — I340 Nonrheumatic mitral (valve) insufficiency: Secondary | ICD-10-CM | POA: Diagnosis not present

## 2023-09-30 DIAGNOSIS — I272 Pulmonary hypertension, unspecified: Secondary | ICD-10-CM | POA: Diagnosis not present

## 2023-09-30 DIAGNOSIS — Z9889 Other specified postprocedural states: Secondary | ICD-10-CM

## 2023-09-30 DIAGNOSIS — Z954 Presence of other heart-valve replacement: Secondary | ICD-10-CM

## 2023-09-30 DIAGNOSIS — I359 Nonrheumatic aortic valve disorder, unspecified: Secondary | ICD-10-CM | POA: Diagnosis not present

## 2023-09-30 LAB — CBC WITH DIFFERENTIAL/PLATELET
Abs Immature Granulocytes: 0.03 K/uL (ref 0.00–0.07)
Basophils Absolute: 0 K/uL (ref 0.0–0.1)
Basophils Relative: 1 %
Eosinophils Absolute: 0 K/uL (ref 0.0–0.5)
Eosinophils Relative: 0 %
HCT: 24.9 % — ABNORMAL LOW (ref 39.0–52.0)
Hemoglobin: 8.2 g/dL — ABNORMAL LOW (ref 13.0–17.0)
Immature Granulocytes: 0 %
Lymphocytes Relative: 20 %
Lymphs Abs: 1.7 K/uL (ref 0.7–4.0)
MCH: 32.5 pg (ref 26.0–34.0)
MCHC: 32.9 g/dL (ref 30.0–36.0)
MCV: 98.8 fL (ref 80.0–100.0)
Monocytes Absolute: 0.8 K/uL (ref 0.1–1.0)
Monocytes Relative: 9 %
Neutro Abs: 5.7 K/uL (ref 1.7–7.7)
Neutrophils Relative %: 70 %
Platelets: 79 K/uL — ABNORMAL LOW (ref 150–400)
RBC: 2.52 MIL/uL — ABNORMAL LOW (ref 4.22–5.81)
RDW: 13.5 % (ref 11.5–15.5)
WBC: 8.3 K/uL (ref 4.0–10.5)
nRBC: 0 % (ref 0.0–0.2)

## 2023-09-30 LAB — BASIC METABOLIC PANEL WITH GFR
Anion gap: 6 (ref 5–15)
Anion gap: 11 (ref 5–15)
BUN: 10 mg/dL (ref 8–23)
BUN: 11 mg/dL (ref 8–23)
CO2: 22 mmol/L (ref 22–32)
CO2: 24 mmol/L (ref 22–32)
Calcium: 7.6 mg/dL — ABNORMAL LOW (ref 8.9–10.3)
Calcium: 9.1 mg/dL (ref 8.9–10.3)
Chloride: 110 mmol/L (ref 98–111)
Chloride: 102 mmol/L (ref 98–111)
Creatinine, Ser: 0.57 mg/dL — ABNORMAL LOW (ref 0.61–1.24)
Creatinine, Ser: 0.56 mg/dL — ABNORMAL LOW (ref 0.61–1.24)
GFR, Estimated: >60 mL/min (ref >60)
GFR, Estimated: >60 mL/min (ref >60)
Glucose, Bld: 103 mg/dL — ABNORMAL HIGH (ref 70–99)
Glucose, Bld: 141 mg/dL — ABNORMAL HIGH (ref 70–99)
Potassium: 3.9 mmol/L (ref 3.5–5.1)
Potassium: 4.2 mmol/L (ref 3.5–5.1)
Sodium: 138 mmol/L (ref 135–145)
Sodium: 137 mmol/L (ref 135–145)

## 2023-09-30 LAB — CBC
HCT: 30 % — ABNORMAL LOW (ref 39.0–52.0)
Hemoglobin: 10.3 g/dL — ABNORMAL LOW (ref 13.0–17.0)
MCH: 32.6 pg (ref 26.0–34.0)
MCHC: 34.3 g/dL (ref 30.0–36.0)
MCV: 94.9 fL (ref 80.0–100.0)
Platelets: 89 K/uL — ABNORMAL LOW (ref 150–400)
RBC: 3.16 MIL/uL — ABNORMAL LOW (ref 4.22–5.81)
RDW: 13.4 % (ref 11.5–15.5)
WBC: 12.8 K/uL — ABNORMAL HIGH (ref 4.0–10.5)
nRBC: 0 % (ref 0.0–0.2)

## 2023-09-30 LAB — BPAM PLATELET PHERESIS
Blood Product Expiration Date: 202507272359
ISSUE DATE / TIME: 202507251135
Unit Type and Rh: 7300

## 2023-09-30 LAB — GLUCOSE, CAPILLARY
Glucose-Capillary: 107 mg/dL — ABNORMAL HIGH (ref 70–99)
Glucose-Capillary: 116 mg/dL — ABNORMAL HIGH (ref 70–99)
Glucose-Capillary: 123 mg/dL — ABNORMAL HIGH (ref 70–99)
Glucose-Capillary: 130 mg/dL — ABNORMAL HIGH (ref 70–99)
Glucose-Capillary: 154 mg/dL — ABNORMAL HIGH (ref 70–99)
Glucose-Capillary: 155 mg/dL — ABNORMAL HIGH (ref 70–99)
Glucose-Capillary: 157 mg/dL — ABNORMAL HIGH (ref 70–99)
Glucose-Capillary: 183 mg/dL — ABNORMAL HIGH (ref 70–99)
Glucose-Capillary: 190 mg/dL — ABNORMAL HIGH (ref 70–99)
Glucose-Capillary: 211 mg/dL — ABNORMAL HIGH (ref 70–99)
Glucose-Capillary: 217 mg/dL — ABNORMAL HIGH (ref 70–99)
Glucose-Capillary: 90 mg/dL (ref 70–99)
Glucose-Capillary: 93 mg/dL (ref 70–99)

## 2023-09-30 LAB — MAGNESIUM
Magnesium: 2.3 mg/dL (ref 1.7–2.4)
Magnesium: 1.9 mg/dL (ref 1.7–2.4)

## 2023-09-30 LAB — COOXEMETRY PANEL
Carboxyhemoglobin: 1.4 % (ref 0.5–1.5)
Methemoglobin: <0.7 % (ref 0.0–1.5)
O2 Saturation: 56 %
Total hemoglobin: 10.5 g/dL — ABNORMAL LOW (ref 12.0–16.0)

## 2023-09-30 LAB — TYPE AND SCREEN
ABO/RH(D): B POS
Antibody Screen: NEGATIVE

## 2023-09-30 LAB — PREPARE PLATELET PHERESIS: Unit division: 0

## 2023-09-30 MED ORDER — INSULIN GLARGINE-YFGN 100 UNIT/ML ~~LOC~~ SOLN
20.0000 [IU] | Freq: Every day | SUBCUTANEOUS | Status: DC
  Administered 2023-09-30: 20 [IU] via SUBCUTANEOUS
  Filled 2023-09-30 (×2): qty 0.2

## 2023-09-30 MED ORDER — FE FUM-VIT C-VIT B12-FA 460-60-0.01-1 MG PO CAPS
1.0000 | ORAL_CAPSULE | Freq: Every day | ORAL | Status: DC
  Administered 2023-09-30 – 2023-10-06 (×7): 1 via ORAL
  Filled 2023-09-30 (×7): qty 1

## 2023-09-30 MED ORDER — POTASSIUM CHLORIDE CRYS ER 20 MEQ PO TBCR
20.0000 meq | EXTENDED_RELEASE_TABLET | Freq: Three times a day (TID) | ORAL | Status: AC
  Administered 2023-09-30 (×3): 20 meq via ORAL
  Filled 2023-09-30 (×3): qty 1

## 2023-09-30 MED ORDER — DIGOXIN 125 MCG PO TABS
0.1250 mg | ORAL_TABLET | Freq: Every day | ORAL | Status: DC
  Administered 2023-09-30: 0.125 mg via ORAL
  Filled 2023-09-30: qty 1

## 2023-09-30 MED ORDER — FUROSEMIDE 10 MG/ML IJ SOLN
40.0000 mg | Freq: Two times a day (BID) | INTRAMUSCULAR | Status: AC
  Administered 2023-09-30 (×2): 40 mg via INTRAVENOUS
  Filled 2023-09-30 (×2): qty 4

## 2023-09-30 MED ORDER — INSULIN ASPART 100 UNIT/ML IJ SOLN
0.0000 [IU] | INTRAMUSCULAR | Status: DC
  Administered 2023-09-30 (×2): 2 [IU] via SUBCUTANEOUS

## 2023-09-30 NOTE — Progress Notes (Signed)
 1 Day Post-Op Procedure(s) (LRB): REPLACEMENT, MITRAL VALVE USING MEDTRONIC MOSAIC PORCINE HEART VALVE (N/A) TRICUSPID VALVE REPAIR USING EDWARDS MC3 ANNULOPLASTY RING (N/A) ECHOCARDIOGRAM, TRANSESOPHAGEAL (N/A) CLIPPING, LEFT ATRIAL APPENDAGE USING ATRICURE ATRICLIP PRO2 Subjective:  Complains of back pain.  Objective: Vital signs in last 24 hours: Temp:  [94.8 F (34.9 C)-99.5 F (37.5 C)] 98.8 F (37.1 C) (07/26 0715) Pulse Rate:  [79-97] 95 (07/26 0715) Cardiac Rhythm: A-V Sequential paced (07/25 2345) Resp:  [10-37] 16 (07/26 0715) BP: (81-128)/(60-96) 117/95 (07/26 0715) SpO2:  [92 %-100 %] 96 % (07/26 0715) Arterial Line BP: (82-143)/(45-86) 116/68 (07/26 0715) FiO2 (%):  [40 %-50 %] 40 % (07/25 1729) Weight:  [53.2 kg] 53.2 kg (07/26 0709)  Hemodynamic parameters for last 24 hours: PAP: (27-93)/(9-28) 77/22 CVP:  [0 mmHg-13 mmHg] 9 mmHg CO:  [3.4 L/min-7.8 L/min] 5.8 L/min CI:  [2.3 L/min/m2-5.2 L/min/m2] 3.9 L/min/m2  Intake/Output from previous day: 07/25 0701 - 07/26 0700 In: 6466.4 [P.O.:500; I.V.:2419.3; Blood:750; NG/GT:50; IV Piggyback:2677.1] Out: 3415 [Urine:2775; Chest Tube:640] Intake/Output this shift: No intake/output data recorded.  General appearance: alert and cooperative Neurologic: intact Heart: regular rate and rhythm Lungs: clear to auscultation bilaterally Abdomen: soft, non-tender; bowel sounds normal Extremities: edema mild Wound: dressing dry  Lab Results: Recent Labs    09/29/23 1844 09/29/23 1846 09/30/23 0454  WBC 8.6  --  8.3  HGB 9.2* 9.2* 8.2*  HCT 27.5* 27.0* 24.9*  PLT 100*  --  79*   BMET:  Recent Labs    09/29/23 1844 09/29/23 1846 09/30/23 0454  NA 142 145 138  K 4.1 4.1 3.9  CL 110  --  110  CO2 24  --  22  GLUCOSE 123*  --  103*  BUN 10  --  10  CREATININE 0.78  --  0.57*  CALCIUM  7.7*  --  7.6*    PT/INR:  Recent Labs    09/29/23 1310  LABPROT 19.4*  INR 1.5*   ABG    Component  Value Date/Time   PHART 7.370 09/29/2023 1846   HCO3 24.8 09/29/2023 1846   TCO2 26 09/29/2023 1846   ACIDBASEDEF 1.0 09/29/2023 1747   O2SAT 98 09/29/2023 1846   CBG (last 3)  Recent Labs    09/30/23 0416 09/30/23 0452 09/30/23 0545  GLUCAP 93 90 154*   CXR: mild pulmonary vascular congestion. Small amount of air under right hemidiaphragm ( small opening made into peritoneum at lower end of sternotomy and closed)  ECG: says accelerated junctional but looks sinus on monitor.  Rate 95.  Assessment/Plan: S/P Procedure(s) (LRB): REPLACEMENT, MITRAL VALVE USING MEDTRONIC MOSAIC PORCINE HEART VALVE (N/A) TRICUSPID VALVE REPAIR USING EDWARDS MC3 ANNULOPLASTY RING (N/A) ECHOCARDIOGRAM, TRANSESOPHAGEAL (N/A) CLIPPING, LEFT ATRIAL APPENDAGE USING ATRICURE ATRICLIP PRO2  POD 1 Hemodynamically stable on milrinone  0.25 and epi 5. CI 3.9 with CVP 9. Pulmonary HTN present and PA pressures have risen postop but were systemic preop. Continue milrinone , and wean epi. Start diuresis. Wt is 12 lbs over preop.  Keep pacing wires and chest tubes for now.  Monitor rhythm.   Transition insulin  drip to SSI and SEMGLEE .  OOB, IS.  Will consult heart failure team since Dr. Rolan was following him previously with RV dysfunction.   LOS: 1 day    Christopher Farrell 09/30/2023

## 2023-09-30 NOTE — Plan of Care (Signed)
  Problem: Education: Goal: Knowledge of General Education information will improve Description: Including pain rating scale, medication(s)/side effects and non-pharmacologic comfort measures Outcome: Progressing   Problem: Clinical Measurements: Goal: Ability to maintain clinical measurements within normal limits will improve Outcome: Progressing Goal: Will remain free from infection Outcome: Progressing Goal: Diagnostic test results will improve Outcome: Progressing Goal: Respiratory complications will improve Outcome: Progressing Goal: Cardiovascular complication will be avoided Outcome: Progressing   Problem: Activity: Goal: Risk for activity intolerance will decrease Outcome: Progressing   Problem: Nutrition: Goal: Adequate nutrition will be maintained Outcome: Progressing   Problem: Elimination: Goal: Will not experience complications related to bowel motility Outcome: Progressing Goal: Will not experience complications related to urinary retention Outcome: Progressing   Problem: Pain Managment: Goal: General experience of comfort will improve and/or be controlled Outcome: Progressing   Problem: Safety: Goal: Ability to remain free from injury will improve Outcome: Progressing   Problem: Skin Integrity: Goal: Risk for impaired skin integrity will decrease Outcome: Progressing

## 2023-09-30 NOTE — Progress Notes (Signed)
 Patient ID: Christopher Farrell, male   DOB: [birth year], 63 y.o.   MRN: [id number]  TCTS Evening Rounds:  Hemodynamically stable off epi. Continuing milrinone  0.25 for PA hypertension and RV dysfunction.  Rhythm looks junctional 70. I see p-waves but marching through separating from QRS. Keep pacing wires and observe.  Diuresing well today.  Minimal chest tube output so will remove.
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
## 2023-10-01 ENCOUNTER — Inpatient Hospital Stay (HOSPITAL_COMMUNITY)

## 2023-10-01 LAB — CBC
HCT: 32.2 % — ABNORMAL LOW (ref 39.0–52.0)
Hemoglobin: 10.8 g/dL — ABNORMAL LOW (ref 13.0–17.0)
MCH: 32.1 pg (ref 26.0–34.0)
MCHC: 33.5 g/dL (ref 30.0–36.0)
MCV: 95.8 fL (ref 80.0–100.0)
Platelets: 69 K/uL — ABNORMAL LOW (ref 150–400)
RBC: 3.36 MIL/uL — ABNORMAL LOW (ref 4.22–5.81)
RDW: 13.6 % (ref 11.5–15.5)
WBC: 9.9 K/uL (ref 4.0–10.5)
nRBC: 0 % (ref 0.0–0.2)

## 2023-10-01 LAB — COOXEMETRY PANEL
Carboxyhemoglobin: 2.3 % — ABNORMAL HIGH (ref 0.5–1.5)
Methemoglobin: 1 % (ref 0.0–1.5)
O2 Saturation: 77.8 %
Total hemoglobin: 9.6 g/dL — ABNORMAL LOW (ref 12.0–16.0)

## 2023-10-01 LAB — BASIC METABOLIC PANEL WITH GFR
Anion gap: 8 (ref 5–15)
BUN: 12 mg/dL (ref 8–23)
CO2: 26 mmol/L (ref 22–32)
Calcium: 8.7 mg/dL — ABNORMAL LOW (ref 8.9–10.3)
Chloride: 103 mmol/L (ref 98–111)
Creatinine, Ser: 0.75 mg/dL (ref 0.61–1.24)
GFR, Estimated: >60 mL/min (ref >60)
Glucose, Bld: 106 mg/dL — ABNORMAL HIGH (ref 70–99)
Potassium: 4.5 mmol/L (ref 3.5–5.1)
Sodium: 137 mmol/L (ref 135–145)

## 2023-10-01 LAB — GLUCOSE, CAPILLARY
Glucose-Capillary: 111 mg/dL — ABNORMAL HIGH (ref 70–99)
Glucose-Capillary: 139 mg/dL — ABNORMAL HIGH (ref 70–99)
Glucose-Capillary: 156 mg/dL — ABNORMAL HIGH (ref 70–99)
Glucose-Capillary: 101 mg/dL — ABNORMAL HIGH (ref 70–99)
Glucose-Capillary: 85 mg/dL (ref 70–99)
Glucose-Capillary: 101 mg/dL — ABNORMAL HIGH (ref 70–99)
Glucose-Capillary: 99 mg/dL (ref 70–99)

## 2023-10-01 MED ORDER — INSULIN ASPART 100 UNIT/ML IJ SOLN: 0.0000 [IU] | Freq: Three times a day (TID) | INTRAMUSCULAR | Status: DC

## 2023-10-01 NOTE — Progress Notes (Signed)
 Advanced Heart Failure Team Consult Note   Primary Physician: Paseda, Folashade R, FNP Cardiologist:  Annabella Scarce, MD Consulting AHF MD: Dr. Gardenia Interval hx  - Junctional rhythm on telemetry, off dig now.  - Mixed venous 77%, sCr 0.75 - 3.6L urine output yesterday  Objective:   Vital Signs:   Temp:  [98.4 F (36.9 C)-99.3 F (37.4 C)] 98.4 F (36.9 C) (07/27 1120) Pulse Rate:  [59-94] 59 (07/27 0830) Resp:  [14-31] 15 (07/27 0830) BP: (90-123)/(64-90) 100/69 (07/27 0830) SpO2:  [90 %-100 %] 99 % (07/27 0830) Arterial Line BP: (90-132)/(58-81) 116/74 (07/26 1830) Weight:  [48.7 kg] 48.7 kg (07/27 0600) Last BM Date : 10/01/23  Weight change: Filed Weights   09/29/23 0548 09/30/23 0709 10/01/23 0600  Weight: 47.7 kg 53.2 kg 48.7 kg    Intake/Output:   Intake/Output Summary (Last 24 hours) at 10/01/2023 1140 Last data filed at 10/01/2023 1138 Gross per 24 hour  Intake 200.5 ml  Output 1815 ml  Net -1614.5 ml     Physical Exam  Neck: R-internal jugular PA catheter in place Cor: PMI nondisplaced. Regular rate & rhythm. No rubs, gallops or murmurs. Lungs: clear Extremities: no cyanosis, clubbing, rash, edema  Neuro: alert & oriented x 3. Moves all 4 extremities w/o difficulty. Affect pleasant.   Telemetry   NSR  EKG    09/29/23: ST  Labs  Basic Metabolic Panel: Recent Labs  Lab 09/28/23 1015 09/29/23 0749 09/29/23 1156 09/29/23 1312 09/29/23 1844 09/29/23 1846 09/30/23 0454 09/30/23 1700 10/01/23 0425  NA 139   < > 140   < > 142 145 138 137 137  K 4.7   < > 3.9   < > 4.1 4.1 3.9 4.2 4.5  CL 103   < > 106  --  110  --  110 102 103  CO2 26  --   --   --  24  --  22 24 26   GLUCOSE 84   < > 198*  --  123*  --  103* 141* 106*  BUN 5*   < > 11  --  10  --  10 11 12   CREATININE 0.69   < > 0.80  --  0.78  --  0.57* 0.56* 0.75  CALCIUM  9.5  --   --   --  7.7*  --  7.6* 9.1 8.7*  MG  --   --   --   --  3.6*  --  2.3 1.9  --    < > = values in  this interval not displayed.    Liver Function Tests: Recent Labs  Lab 09/28/23 1015  AST 37  ALT 21  ALKPHOS 87  BILITOT 1.1  PROT 7.7  ALBUMIN  3.9   No results for input(s): LIPASE, AMYLASE in the last 168 hours. No results for input(s): AMMONIA in the last 168 hours.  CBC: Recent Labs  Lab 09/29/23 1310 09/29/23 1312 09/29/23 1844 09/29/23 1846 09/30/23 0454 09/30/23 1700 10/01/23 0425  WBC 12.4*  --  8.6  --  8.3 12.8* 9.9  NEUTROABS  --   --  6.4  --  5.7  --   --   HGB 9.5*   < > 9.2* 9.2* 8.2* 10.3* 10.8*  HCT 28.8*   < > 27.5* 27.0* 24.9* 30.0* 32.2*  MCV 97.3  --  96.2  --  98.8 94.9 95.8  PLT 105*  --  100*  --  79* 89* 69*   < > =  values in this interval not displayed.    Cardiac Enzymes: No results for input(s): CKTOTAL, CKMB, CKMBINDEX, TROPONINI in the last 168 hours.  BNP: BNP (last 3 results) Recent Labs    12/16/22 1047 12/27/22 1114 05/02/23 1005  BNP 1,671* 682.4* 733.1*    Coagulation Studies: Recent Labs    09/29/23 1310  LABPROT 19.4*  INR 1.5*    Imaging   DG Chest Port 1 View Result Date: 10/01/2023 EXAM: 1 VIEW XRAY OF THE CHEST 10/01/2023 05:22:00 AM COMPARISON: None available. CLINICAL HISTORY: [id number] S/P MVR (mitral valve repair) [id number]. S/p MVR S/P MVR (mitral valve repair) S/p MVR FINDINGS: LUNGS AND PLEURA: Minimal bibasilar atelectasis is present. Previously suspected pneumoperitoneum is not evident on today's study. HEART AND MEDIASTINUM: The heart size is mildly enlarged. Atherosclerotic changes are again noted at the aortic arch. BONES AND SOFT TISSUES: No acute osseous abnormality. LINES AND TUBES: Norva purl catheter was removed. A right IJ sheath remains. The mediastinal drain was removed. IMPRESSION: 1. No acute findings. 2. Mildly enlarged heart size and atherosclerotic changes at the aortic arch. Electronically signed by: Lonni Necessary MD 10/01/2023 09:12 AM EDT RP Workstation: [id number]     Medications:   Current Medications:  acetaminophen   1,000 mg Oral Q6H   Or   acetaminophen  (TYLENOL ) oral liquid 160 mg/5 mL  1,000 mg Per Tube Q6H   aspirin  EC  325 mg Oral Daily   Or   aspirin   324 mg Per Tube Daily   bictegravir-emtricitabine -tenofovir  AF  1 tablet Oral Daily   bisacodyl   10 mg Oral Daily   Or   bisacodyl   10 mg Rectal Daily   Chlorhexidine  Gluconate Cloth  6 each Topical Daily   docusate sodium   200 mg Oral Daily   escitalopram   20 mg Oral Daily   Fe Fum-Vit C-Vit B12-FA  1 capsule Oral QPC lunch   insulin  aspart  0-9 Units Subcutaneous TID WC   pantoprazole   40 mg Oral Daily   sodium chloride  flush  3 mL Intravenous Q12H    Infusions:  milrinone  0.25 mcg/kg/min (09/30/23 1700)    Patient Profile   Christopher Farrell is a 63 y.o. M w/ HIV, mitral valve endocarditis, pulmonary hypertension s/p MVR and tricuspid valve repair.   Assessment/Plan  Severe mitral regurgitation s/p MVR + tricuspid valve repair  - Mitral endocarditis diagnosed in 7/24 s/p IV antibiotics  - MVR on 09/29/23 with 31mm Medtronic Mosaic porcine valve - Tricuspid ring repair with 30mm Edwards MC3 annuloplastly ring - Patient did well intra-op and post-operatively ;extubated without complications - Reviewed intra-op TEE images: LVEF 40% due to pacing/ectopy & septal dyssynchrony; RV function mod-severely reduced. LVOT VTI 17.6cm.  - sCr 0.57 , COOX pending; CVP 3, PA 52/15 - CXR clear - IV lasix  40mg  x 1 today; 2L urine output thus far. Will continue. 12lbs above pre-op weight.   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  - start digoxin  125mcg daily - COOX pending  - Wean off epi  - start digoxin  125mcg daily. Discussed with pharmD.  - Continue milrinone  0.25mcg/kg/min; PA pressures  have improved significantly post-operatively. - Can plan to D/C PA catheter later today.   Medication concerns reviewed with patient and pharmacy team.   Length of Stay: 2  [name]  [name], DO  10/01/2023, 11:40 AM  Advanced Heart Failure Team Pager [phone] (M-F; 7a - 5p)   Please visit [email or web address]: For overnight coverage please call cardiology fellow first. If fellow is not available call Shock/ECMO MD on call. For ECMO / Mechanical Support (Impella, IABP, LVAD) issues call Shock/ ECMO MD on call   CRITICAL CARE Performed by: Ria Commander   Total critical care time: 45 minutes  Critical care time was exclusive of separately billable procedures and treating other patients.  Critical care was necessary to treat or prevent imminent or life-threatening deterioration.  Critical care was time spent personally by me on the following activities: development of treatment plan with patient and/or surrogate as well as nursing, discussions with consultants, evaluation of patient's response to treatment, examination of patient, obtaining history from patient or surrogate, ordering and performing treatments and interventions, ordering and review of laboratory studies, ordering and review of radiographic studies, pulse oximetry and re-evaluation of patient's condition.

## 2023-10-01 NOTE — Plan of Care (Signed)
  Problem: Education: Goal: Knowledge of General Education information will improve Description: Including pain rating scale, medication(s)/side effects and non-pharmacologic comfort measures Outcome: Progressing   Problem: Clinical Measurements: Goal: Ability to maintain clinical measurements within normal limits will improve Outcome: Progressing Goal: Will remain free from infection Outcome: Progressing Goal: Diagnostic test results will improve Outcome: Progressing Goal: Respiratory complications will improve Outcome: Progressing Goal: Cardiovascular complication will be avoided Outcome: Progressing   Problem: Activity: Goal: Risk for activity intolerance will decrease Outcome: Progressing   Problem: Skin Integrity: Goal: Risk for impaired skin integrity will decrease Outcome: Progressing

## 2023-10-01 NOTE — Plan of Care (Signed)

## 2023-10-01 NOTE — Progress Notes (Signed)
 Patient ID: Christopher Farrell, male   DOB: Oct 20, 1960, 63 y.o.   MRN: [id number]  TCTS Evening Rounds:  Hemodynamically stable today on milrinone  0.125.  UO good.  Rhythm remains junctional 65 with narrow complex.  Ambulated.

## 2023-10-01 NOTE — Progress Notes (Signed)
 2 Days Post-Op Procedure(s) (LRB): REPLACEMENT, MITRAL VALVE USING MEDTRONIC MOSAIC PORCINE HEART VALVE (N/A) TRICUSPID VALVE REPAIR USING EDWARDS MC3 ANNULOPLASTY RING (N/A) ECHOCARDIOGRAM, TRANSESOPHAGEAL (N/A) CLIPPING, LEFT ATRIAL APPENDAGE USING ATRICURE ATRICLIP PRO2 Subjective: No complaints. Feels better with tubes out.  Objective: Vital signs in last 24 hours: Temp:  [98.1 F (36.7 C)-99.3 F (37.4 C)] 99.3 F (37.4 C) (07/26 1830) Pulse Rate:  [64-97] 73 (07/26 2000) Cardiac Rhythm: Junctional rhythm (07/26 2000) Resp:  [14-31] 24 (07/26 2000) BP: (88-123)/(57-92) 123/88 (07/26 2000) SpO2:  [90 %-100 %] 100 % (07/26 2000) Arterial Line BP: (90-133)/(58-81) 116/74 (07/26 1830) Weight:  [48.7 kg] 48.7 kg (07/27 0600)  Hemodynamic parameters for last 24 hours: PAP: (43-83)/(12-34) 81/34 CVP:  [2 mmHg-27 mmHg] 27 mmHg CO:  [4.4 L/min-4.6 L/min] 4.4 L/min CI:  [2.9 L/min/m2-3.1 L/min/m2] 2.9 L/min/m2  Intake/Output from previous day: 07/26 0701 - 07/27 0700 In: 272.7 [I.V.:172.8; IV Piggyback:99.9] Out: 3665 [Urine:3635; Chest Tube:30] Intake/Output this shift: No intake/output data recorded.  General appearance: alert and cooperative Neurologic: intact Heart: regular rate and rhythm Lungs: clear to auscultation bilaterally Extremities: no edema Wound: dressing dry  Lab Results: Recent Labs    09/30/23 1700 10/01/23 0425  WBC 12.8* 9.9  HGB 10.3* 10.8*  HCT 30.0* 32.2*  PLT 89* 69*   BMET:  Recent Labs    09/30/23 1700 10/01/23 0425  NA 137 137  K 4.2 4.5  CL 102 103  CO2 24 26  GLUCOSE 141* 106*  BUN 11 12  CREATININE 0.56* 0.75  CALCIUM  9.1 8.7*    PT/INR:  Recent Labs    09/29/23 1310  LABPROT 19.4*  INR 1.5*   ABG    Component Value Date/Time   PHART 7.370 09/29/2023 1846   HCO3 24.8 09/29/2023 1846   TCO2 26 09/29/2023 1846   ACIDBASEDEF 1.0 09/29/2023 1747   O2SAT 77.8 10/01/2023 0423   CBG (last 3)  Recent  Labs    09/30/23 2012 10/01/23 0000 10/01/23 0344  GLUCAP 116* 139* 111*   CXR: clear  ECG: junctional rhythm 60's. Some p-waves present.  Assessment/Plan: S/P Procedure(s) (LRB): REPLACEMENT, MITRAL VALVE USING MEDTRONIC MOSAIC PORCINE HEART VALVE (N/A) TRICUSPID VALVE REPAIR USING EDWARDS MC3 ANNULOPLASTY RING (N/A) ECHOCARDIOGRAM, TRANSESOPHAGEAL (N/A) CLIPPING, LEFT ATRIAL APPENDAGE USING ATRICURE ATRICLIP PRO2  POD 2  Hemodynamically stable in junctional rhythm on milrinone  0.25. Co-ox 78%. AHF team will decide about weaning milrinone . Moderate RV dysfunction on echo but had severe pulmonary HTN due to severe MR preop. DC digoxin  with junctional rhythm. Not sure if this is sinus node dysfunction or AV node dysfunction. Continue observation. No beta blocker.  -3400 cc yesterday  and wt down 10 lbs if accurate. Now 2 lbs over preop and no edema.   DC foley.  Continue IS, ambulate in halls.  I am not planning to use Coumadin in this patient with a tissue mitral valve and left atrial clip. I don't think he is reliable enough to trust anticoagulation.    LOS: 2 days    Christopher Farrell 10/01/2023

## 2023-10-02 ENCOUNTER — Encounter (HOSPITAL_COMMUNITY): Payer: Self-pay | Admitting: Surgery

## 2023-10-02 DIAGNOSIS — I492 Junctional premature depolarization: Secondary | ICD-10-CM

## 2023-10-02 DIAGNOSIS — I442 Atrioventricular block, complete: Secondary | ICD-10-CM

## 2023-10-02 LAB — CBC
HCT: 27.5 % — ABNORMAL LOW (ref 39.0–52.0)
Hemoglobin: 9.1 g/dL — ABNORMAL LOW (ref 13.0–17.0)
MCH: 31.9 pg (ref 26.0–34.0)
MCHC: 33.1 g/dL (ref 30.0–36.0)
MCV: 96.5 fL (ref 80.0–100.0)
Platelets: 104 K/uL — ABNORMAL LOW (ref 150–400)
RBC: 2.85 MIL/uL — ABNORMAL LOW (ref 4.22–5.81)
RDW: 13.5 % (ref 11.5–15.5)
WBC: 12.8 K/uL — ABNORMAL HIGH (ref 4.0–10.5)
nRBC: 0 % (ref 0.0–0.2)

## 2023-10-02 LAB — GLUCOSE, CAPILLARY
Glucose-Capillary: 97 mg/dL (ref 70–99)
Glucose-Capillary: 114 mg/dL — ABNORMAL HIGH (ref 70–99)
Glucose-Capillary: 127 mg/dL — ABNORMAL HIGH (ref 70–99)

## 2023-10-02 LAB — SURGICAL PATHOLOGY

## 2023-10-02 LAB — BASIC METABOLIC PANEL WITH GFR
Anion gap: 10 (ref 5–15)
BUN: 12 mg/dL (ref 8–23)
CO2: 25 mmol/L (ref 22–32)
Calcium: 8.6 mg/dL — ABNORMAL LOW (ref 8.9–10.3)
Chloride: 97 mmol/L — ABNORMAL LOW (ref 98–111)
Creatinine, Ser: 0.63 mg/dL (ref 0.61–1.24)
GFR, Estimated: >60 mL/min (ref >60)
Glucose, Bld: 170 mg/dL — ABNORMAL HIGH (ref 70–99)
Potassium: 3.8 mmol/L (ref 3.5–5.1)
Sodium: 132 mmol/L — ABNORMAL LOW (ref 135–145)

## 2023-10-02 LAB — COOXEMETRY PANEL
Carboxyhemoglobin: 1.6 % — ABNORMAL HIGH (ref 0.5–1.5)
Carboxyhemoglobin: 1.7 % — ABNORMAL HIGH (ref 0.5–1.5)
Methemoglobin: <0.7 % (ref 0.0–1.5)
Methemoglobin: <0.7 % (ref 0.0–1.5)
O2 Saturation: 50.4 %
O2 Saturation: 61.4 %
Total hemoglobin: 9.2 g/dL — ABNORMAL LOW (ref 12.0–16.0)
Total hemoglobin: 9.4 g/dL — ABNORMAL LOW (ref 12.0–16.0)

## 2023-10-02 MED ORDER — POTASSIUM CHLORIDE CRYS ER 20 MEQ PO TBCR
40.0000 meq | EXTENDED_RELEASE_TABLET | Freq: Once | ORAL | Status: AC
  Administered 2023-10-02: 40 meq via ORAL
  Filled 2023-10-02: qty 2

## 2023-10-02 MED ORDER — FUROSEMIDE 10 MG/ML IJ SOLN
INTRAMUSCULAR | Status: AC
  Administered 2023-10-02: 40 mg via INTRAVENOUS
  Filled 2023-10-02: qty 4

## 2023-10-02 MED ORDER — FUROSEMIDE 10 MG/ML IJ SOLN
40.0000 mg | Freq: Two times a day (BID) | INTRAMUSCULAR | Status: AC
  Administered 2023-10-02: 40 mg via INTRAVENOUS
  Filled 2023-10-02 (×2): qty 4

## 2023-10-02 MED ORDER — FUROSEMIDE 10 MG/ML IJ SOLN: 40.0000 mg | Freq: Once | INTRAMUSCULAR | Status: DC

## 2023-10-02 MED FILL — Thrombin (Recombinant) For Soln 20000 Unit: CUTANEOUS | Qty: 1 | Status: AC

## 2023-10-02 NOTE — Progress Notes (Addendum)
 Advanced Heart Failure Rounding Note  Cardiologist: Annabella Scarce, MD  Consulting AHF MD: Dr. Gardenia Chief Complaint: s/p MVR & tricuspid repair Subjective:    - Stable on milrinone  .125 - Remains in junctional rhythm. Off digoxin  - Mixed venous 50%, no labs this morning.  - UOP documented  Feels ok this morning just tired and complaining of cough. Has been ambulating intermittently. Denies CP/SOB.   Objective:   Weight Range: 49.4 kg Body mass index is 18.71 kg/m.   Vital Signs:   Temp:  [98.4 F (36.9 C)-98.7 F (37.1 C)] 98.6 F (37 C) (07/28 0744) Pulse Rate:  [58-65] 61 (07/28 0630) Resp:  [13-32] 28 (07/28 0630) BP: (86-118)/(59-89) 96/74 (07/28 0630) SpO2:  [90 %-100 %] 92 % (07/28 0630) Weight:  [49.4 kg] 49.4 kg (07/28 0622) Last BM Date : 10/01/23  Weight change: Filed Weights   09/30/23 0709 10/01/23 0600 10/02/23 0622  Weight: 53.2 kg 48.7 kg 49.4 kg   Intake/Output:   Intake/Output Summary (Last 24 hours) at 10/02/2023 9191 Last data filed at 10/02/2023 0700 Gross per 24 hour  Intake 532.16 ml  Output 940 ml  Net -407.84 ml    Physical Exam    General:  elderly / frail appearing.  No respiratory difficulty Neck: JVD ~8 cm.  Cor: Regular rate & rhythm. No murmurs. Lungs: clear Extremities: no edema  Neuro: alert & oriented x 3. Affect flat.   Telemetry   Junctional 60s   Labs    CBC Recent Labs    09/29/23 1844 09/29/23 1846 09/30/23 0454 09/30/23 1700 10/01/23 0425  WBC 8.6  --  8.3 12.8* 9.9  NEUTROABS 6.4  --  5.7  --   --   HGB 9.2*   < > 8.2* 10.3* 10.8*  HCT 27.5*   < > 24.9* 30.0* 32.2*  MCV 96.2  --  98.8 94.9 95.8  PLT 100*  --  79* 89* 69*   < > = values in this interval not displayed.   Basic Metabolic Panel Recent Labs    92/73/74 0454 09/30/23 1700 10/01/23 0425  NA 138 137 137  K 3.9 4.2 4.5  CL 110 102 103  CO2 22 24 26   GLUCOSE 103* 141* 106*  BUN 10 11 12   CREATININE 0.57* 0.56* 0.75   CALCIUM  7.6* 9.1 8.7*  MG 2.3 1.9  --    Liver Function Tests No results for input(s): AST, ALT, ALKPHOS, BILITOT, PROT, ALBUMIN  in the last 72 hours. No results for input(s): LIPASE, AMYLASE in the last 72 hours. Cardiac Enzymes No results for input(s): CKTOTAL, CKMB, CKMBINDEX, TROPONINI in the last 72 hours.  BNP: BNP (last 3 results) Recent Labs    12/16/22 1047 12/27/22 1114 05/02/23 1005  BNP 1,671* 682.4* 733.1*    ProBNP (last 3 results) No results for input(s): PROBNP in the last 8760 hours.   D-Dimer No results for input(s): DDIMER in the last 72 hours. Hemoglobin A1C No results for input(s): HGBA1C in the last 72 hours. Fasting Lipid Panel No results for input(s): CHOL, HDL, LDLCALC, TRIG, CHOLHDL, LDLDIRECT in the last 72 hours. Thyroid Function Tests No results for input(s): TSH, T4TOTAL, T3FREE, THYROIDAB in the last 72 hours.  Invalid input(s): FREET3  Other results:   Imaging    No results found.   Medications:     Scheduled Medications:  acetaminophen   1,000 mg Oral Q6H   Or   acetaminophen  (TYLENOL ) oral liquid 160 mg/5 mL  1,000 mg Per Tube Q6H   aspirin  EC  325 mg Oral Daily   Or   aspirin   324 mg Per Tube Daily   bictegravir-emtricitabine -tenofovir  AF  1 tablet Oral Daily   bisacodyl   10 mg Oral Daily   Or   bisacodyl   10 mg Rectal Daily   Chlorhexidine  Gluconate Cloth  6 each Topical Daily   docusate sodium   200 mg Oral Daily   escitalopram   20 mg Oral Daily   Fe Fum-Vit C-Vit B12-FA  1 capsule Oral QPC lunch   pantoprazole   40 mg Oral Daily   sodium chloride  flush  3 mL Intravenous Q12H    Infusions:  milrinone  0.125 mcg/kg/min (10/02/23 0700)    PRN Medications: albuterol , dextrose , ondansetron  (ZOFRAN ) IV, mouth rinse, oxyCODONE , sodium chloride  flush, traMADol     Patient Profile   Christopher Farrell is a 63 y.o. with history of HIV, mitral valve endocarditis  diagnosed in July 2024, CVA, history of bacterial meningitis due to strep pneumonia currently in the CVICU postoperatively after mitral valve replacement (31 mm Medtronic Mosaic porcine valve), tricuspid valve repair by Dr. Lucas.   Assessment/Plan  Severe mitral regurgitation s/p MVR + tricuspid valve repair  - Mitral endocarditis diagnosed in 7/24 s/p IV antibiotics  - MVR on 09/29/23 with 31mm Medtronic Mosaic porcine valve - Tricuspid ring repair with 30mm Edwards MC3 annuloplastly ring - Patient did well intra-op and post-operatively ;extubated without complications - Reviewed intra-op TEE images: LVEF 40% due to pacing/ectopy & septal dyssynchrony; RV function mod-severely reduced. LVOT VTI 17.6cm.  - No labs this morning, BMET ordered. Co-ox this morning 50. Stat repeat ordered. If co-ox higher will plan to stop milrinone  today.  - CXR clear - Volume slightly elevated, still up 4lbs from pre-op weight. Will give lasix  40 mg x1.    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  - Stable off Epi - Off digoxin  2/2 rhythm  - Continue milrinone  0.125mcg/kg/min. Plan to turn off later today.   3. Junctional rhythm - now off digoxin  - Per TCTS, EP will be consulted today - Will keep pacing wires in for now per TCTS.   Length of Stay: 3  Christopher LITTIE Coe, NP  10/02/2023, 8:08 AM  Advanced Heart Failure Team Pager [phone] (M-F; 7a - 5p)  Please contact [facility] for night-coverage after hours (5p -7a ) and weekends on [email or web address]   Patient seen with NP, I formulated the plan and agree with the above note.   He remains on milrinone  0.125, co-ox 61% today.    HR in 60s but appears to be in complete heart block probably with high junctional escape.   General: NAD, frail.  Neck: JVP 9-10 cm,  no thyromegaly or thyroid nodule.  Lungs: Clear to auscultation bilaterally with normal respiratory effort. CV: Nondisplaced PMI.  Heart regular S1/S2, no S3/S4, no murmur.  No peripheral edema.   Abdomen: Soft, nontender, no hepatosplenomegaly, no distention.  Skin: Intact without lesions or rashes.  Neurologic: Alert and oriented x 3.  Psych: Normal affect. Extremities: No clubbing or cyanosis.  HEENT: Normal.   I will stop milrinone  today.  Will get echo tomorrow once off inotrope.  Mild volume overload on exam, will give Lasix  40 mg IV bid today and replace K.  Suspect complete heart block with high junctional escape and HR stable in 60s.  Will ask EP to see.   Mobilize.   CRITICAL CARE Performed by: Ezra Shuck  Total critical care time: 35 minutes  Critical care time was exclusive of separately billable procedures and treating other patients.  Critical care was necessary to treat or prevent imminent or life-threatening deterioration.  Critical care was time spent personally by me on the following activities: development of treatment plan with patient and/or surrogate as well as nursing, discussions with consultants, evaluation of patient's response to treatment, examination of patient, obtaining history from patient or surrogate, ordering and performing treatments and interventions, ordering and review of laboratory studies, ordering and review of radiographic studies, pulse oximetry and re-evaluation of patient's condition.  Ezra Shuck 10/02/2023 9:06 AM

## 2023-10-02 NOTE — Progress Notes (Signed)
 3 Days Post-Op Procedure(s) (LRB): REPLACEMENT, MITRAL VALVE USING MEDTRONIC MOSAIC PORCINE HEART VALVE (N/A) TRICUSPID VALVE REPAIR USING EDWARDS MC3 ANNULOPLASTY RING (N/A) ECHOCARDIOGRAM, TRANSESOPHAGEAL (N/A) CLIPPING, LEFT ATRIAL APPENDAGE USING ATRICURE ATRICLIP PRO2 Subjective: No complaints. Walked yesterday but not today yet.  No problems overnight.   Still on milrinone  0.125 with Co-ox 50.4 this am. It was 77.8 yesterday am on 0.25.  Objective: Vital signs in last 24 hours: Temp:  [98.4 F (36.9 C)-99 F (37.2 C)] 98.6 F (37 C) (07/27 2300) Pulse Rate:  [58-65] 61 (07/28 0630) Cardiac Rhythm: Junctional rhythm (07/27 2000) Resp:  [13-32] 28 (07/28 0630) BP: (86-118)/(59-89) 96/74 (07/28 0630) SpO2:  [90 %-100 %] 92 % (07/28 0630) Weight:  [49.4 kg] 49.4 kg (07/28 0622)  Hemodynamic parameters for last 24 hours:    Intake/Output from previous day: 07/27 0701 - 07/28 0700 In: 569 [P.O.:477; I.V.:92] Out: 815 [Urine:815] Intake/Output this shift: Total I/O In: 21.5 [I.V.:21.5] Out: 450 [Urine:450]  General appearance: alert and cooperative Neurologic: intact Heart: regular rate and rhythm, S1, S2 normal, no murmur Lungs: clear to auscultation bilaterally Extremities: no edema Wound: incision healing well  Lab Results: Recent Labs    09/30/23 1700 10/01/23 0425  WBC 12.8* 9.9  HGB 10.3* 10.8*  HCT 30.0* 32.2*  PLT 89* 69*   BMET:  Recent Labs    09/30/23 1700 10/01/23 0425  NA 137 137  K 4.2 4.5  CL 102 103  CO2 24 26  GLUCOSE 141* 106*  BUN 11 12  CREATININE 0.56* 0.75  CALCIUM  9.1 8.7*    PT/INR:  Recent Labs    09/29/23 1310  LABPROT 19.4*  INR 1.5*   ABG    Component Value Date/Time   PHART 7.370 09/29/2023 1846   HCO3 24.8 09/29/2023 1846   TCO2 26 09/29/2023 1846   ACIDBASEDEF 1.0 09/29/2023 1747   O2SAT 50.4 10/02/2023 0623   CBG (last 3)  Recent Labs    10/01/23 1545 10/01/23 1958 10/02/23 0614   GLUCAP 85 99 97    Assessment/Plan: S/P Procedure(s) (LRB): REPLACEMENT, MITRAL VALVE USING MEDTRONIC MOSAIC PORCINE HEART VALVE (N/A) TRICUSPID VALVE REPAIR USING EDWARDS MC3 ANNULOPLASTY RING (N/A) ECHOCARDIOGRAM, TRANSESOPHAGEAL (N/A) CLIPPING, LEFT ATRIAL APPENDAGE USING ATRICURE ATRICLIP PRO2  Hemodynamically stable with low normal BP although higher than he was preop. AHF team will decide about milrinone .  Rhythm continue to look like narrow complex junctional in the 60's with intermittent p-waves. I am not sure if this is sinus node dysfunction or CHB. I think he will be better off with this junctional rhythm than AV paced with RV dysfunction. Will ask EP to see him to get their opinion. Keep pacing wires in for now.  Continue IS, ambulation.  I don't think he is a candidate for Coumadin with history of noncompliance with followup.   LOS: 3 days    Christopher Farrell 10/02/2023

## 2023-10-02 NOTE — Progress Notes (Signed)
      [address]       [place] [address]             [phone]      POD # 3 mitral replacement, TV repair  BP 95/67 (BP Location: Right Arm)   Pulse 60   Temp 98.7 F (37.1 C) (Oral)   Resp 20   Ht 5' 4 (1.626 m)   Wt 49.4 kg   SpO2 94%   BMI 18.71 kg/m    Intake/Output Summary (Last 24 hours) at 10/02/2023 1656 Last data filed at 10/02/2023 1100 Gross per 24 hour  Intake 507.5 ml  Output 915 ml  Net -407.5 ml   EP now following  Elspeth C. Kerrin, MD [facility] [phone]
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
## 2023-10-02 NOTE — Plan of Care (Signed)

## 2023-10-03 ENCOUNTER — Inpatient Hospital Stay (HOSPITAL_COMMUNITY)

## 2023-10-03 DIAGNOSIS — I4891 Unspecified atrial fibrillation: Secondary | ICD-10-CM

## 2023-10-03 DIAGNOSIS — I5021 Acute systolic (congestive) heart failure: Secondary | ICD-10-CM | POA: Diagnosis not present

## 2023-10-03 LAB — COOXEMETRY PANEL
Carboxyhemoglobin: 1.3 % (ref 0.5–1.5)
Methemoglobin: <0.7 % (ref 0.0–1.5)
O2 Saturation: 58.2 %
Total hemoglobin: 9.7 g/dL — ABNORMAL LOW (ref 12.0–16.0)

## 2023-10-03 LAB — ECHOCARDIOGRAM COMPLETE
Area-P 1/2: 2.14 cm2
Height: 64 in
S' Lateral: 3 cm
Weight: 1724.88 [oz_av]

## 2023-10-03 LAB — BASIC METABOLIC PANEL WITH GFR
Anion gap: 10 (ref 5–15)
BUN: 17 mg/dL (ref 8–23)
CO2: 28 mmol/L (ref 22–32)
Calcium: 8.7 mg/dL — ABNORMAL LOW (ref 8.9–10.3)
Chloride: 101 mmol/L (ref 98–111)
Creatinine, Ser: 0.63 mg/dL (ref 0.61–1.24)
GFR, Estimated: >60 mL/min (ref >60)
Glucose, Bld: 121 mg/dL — ABNORMAL HIGH (ref 70–99)
Potassium: 3.7 mmol/L (ref 3.5–5.1)
Sodium: 139 mmol/L (ref 135–145)

## 2023-10-03 MED ORDER — SODIUM CHLORIDE 0.9% FLUSH: 3.0000 mL | INTRAVENOUS | Status: DC | PRN

## 2023-10-03 MED ORDER — ~~LOC~~ CARDIAC SURGERY, PATIENT & FAMILY EDUCATION: Freq: Once | Status: AC

## 2023-10-03 MED ORDER — FUROSEMIDE 10 MG/ML IJ SOLN
40.0000 mg | Freq: Once | INTRAMUSCULAR | Status: AC
  Filled 2023-10-03: qty 4

## 2023-10-03 MED ORDER — SODIUM CHLORIDE 0.9 % IV SOLN: 250.0000 mL | INTRAVENOUS | Status: AC | PRN

## 2023-10-03 MED ORDER — FUROSEMIDE 10 MG/ML IJ SOLN
INTRAMUSCULAR | Status: AC
  Administered 2023-10-03: 40 mg via INTRAVENOUS
  Filled 2023-10-03: qty 4

## 2023-10-03 MED ORDER — POTASSIUM CHLORIDE CRYS ER 20 MEQ PO TBCR
20.0000 meq | EXTENDED_RELEASE_TABLET | Freq: Three times a day (TID) | ORAL | Status: AC
Start: 2023-10-03 — End: 2023-10-03
  Administered 2023-10-03 (×3): 20 meq via ORAL
  Filled 2023-10-03 (×3): qty 1

## 2023-10-03 MED ORDER — SPIRONOLACTONE 12.5 MG HALF TABLET
12.5000 mg | ORAL_TABLET | Freq: Every day | ORAL | Status: DC
  Administered 2023-10-03 – 2023-10-06 (×4): 12.5 mg via ORAL
  Filled 2023-10-03 (×4): qty 1

## 2023-10-03 MED ORDER — SODIUM CHLORIDE 0.9% FLUSH
3.0000 mL | Freq: Two times a day (BID) | INTRAVENOUS | Status: DC
  Administered 2023-10-03 – 2023-10-06 (×6): 3 mL via INTRAVENOUS

## 2023-10-03 MED FILL — Heparin Sodium (Porcine) Inj 1000 Unit/ML: Qty: 1000 | Status: AC

## 2023-10-03 MED FILL — Potassium Chloride Inj 2 mEq/ML: INTRAVENOUS | Qty: 40 | Status: AC

## 2023-10-03 MED FILL — Lidocaine HCl Local Preservative Free (PF) Inj 2%: INTRAMUSCULAR | Qty: 14 | Status: AC

## 2023-10-03 NOTE — Progress Notes (Addendum)
      [address] [employer] [address]             [phone]         4 Days Post-Op Procedure(s) (LRB): REPLACEMENT, MITRAL VALVE USING MEDTRONIC MOSAIC PORCINE HEART VALVE (N/A) TRICUSPID VALVE REPAIR USING EDWARDS MC3 ANNULOPLASTY RING (N/A) ECHOCARDIOGRAM, TRANSESOPHAGEAL (N/A) CLIPPING, LEFT ATRIAL APPENDAGE USING ATRICURE ATRICLIP PRO2  Subjective:  Patient flat affect.  Overall without complaints.   Objective: Vital signs in last 24 hours: Temp:  [98.4 F (36.9 C)-98.7 F (37.1 C)] 98.6 F (37 C) (07/28 2000) Pulse Rate:  [58-84] 61 (07/29 0800) Cardiac Rhythm: Junctional rhythm (07/29 0800) Resp:  [11-30] 11 (07/29 0800) BP: (81-142)/(57-84) 95/65 (07/29 0800) SpO2:  [88 %-97 %] 97 % (07/29 0800) Weight:  [48.9 kg] 48.9 kg (07/29 0600)  Hemodynamic parameters for last 24 hours: CVP:  [9 mmHg] 9 mmHg  Intake/Output from previous day: 07/28 0701 - 07/29 0700 In: 723.7 [P.O.:720; I.V.:3.7] Out: 1750 [Urine:1750] Intake/Output this shift: Total I/O In: 240 [P.O.:240] Out: -   General appearance: alert, cooperative, and no distress Heart: irregularly irregular rhythm Lungs: clear to auscultation bilaterally Abdomen: soft, non-tender; bowel sounds normal; no masses,  no organomegaly Extremities: edema none present Wound: clean and dry  Lab Results: Recent Labs    10/01/23 0425 10/02/23 1120  WBC 9.9 12.8*  HGB 10.8* 9.1*  HCT 32.2* 27.5*  PLT 69* 104*   BMET:  Recent Labs    10/02/23 0838 10/03/23 0423  NA 132* 139  K 3.8 3.7  CL 97* 101  CO2 25 28  GLUCOSE 170* 121*  BUN 12 17  CREATININE 0.63 0.63  CALCIUM  8.6* 8.7*    PT/INR: No results for input(s): LABPROT, INR in the last 72 hours. ABG    Component Value Date/Time   PHART 7.370 09/29/2023 1846   HCO3 24.8 09/29/2023 1846   TCO2 26 09/29/2023 1846   ACIDBASEDEF 1.0 09/29/2023 1747   O2SAT 58.2 10/03/2023 0423   CBG (last 3)  Recent  Labs    10/02/23 0614 10/02/23 1126 10/02/23 1607  GLUCAP 97 114* 127*    Assessment/Plan: S/P Procedure(s) (LRB): REPLACEMENT, MITRAL VALVE USING MEDTRONIC MOSAIC PORCINE HEART VALVE (N/A) TRICUSPID VALVE REPAIR USING EDWARDS MC3 ANNULOPLASTY RING (N/A) ECHOCARDIOGRAM, TRANSESOPHAGEAL (N/A) CLIPPING, LEFT ATRIAL APPENDAGE USING ATRICURE ATRICLIP PRO2  CV- CHB, with junctional rate, not a candidate for coumadin.. he does have a LA Clip in place.. Milrinone  has been stopped.. AHF following. Previously having some junctional vs. CHB... EP is also following, can hopefully avoid PPM Pulm-no acute issues, continue IS Renal-creatinine has been stable, has been receiving IV diuretics, will defer to Dr. Lucas, AHF ID- H/O Endocarditis, OR cultures remain negative  HIV- continue home antivirals Dispo- patient stable, off Milrinone .. in rate controlled A. Fib today, not a candidate for anticoagulation clip in place.. AHF following, EP following, possibly ready for transfer to 4E   LOS: 4 days    Rocky Shad, PA-C 10/03/2023 8:29 AM

## 2023-10-03 NOTE — Progress Notes (Signed)
  Patient Name: Christopher Farrell Date of Encounter: 10/03/2023  Primary Cardiologist: Annabella Scarce, MD Electrophysiologist: None  Interval Summary   NAEON.  He denies chest pain, chest pressure, palpitations, SOB or dizziness.    Vital Signs    Vitals:   10/03/23 0400 10/03/23 0500 10/03/23 0600 10/03/23 0800  BP: 110/84 105/72 104/73 95/65  Pulse: 76 84 63 61  Resp: 19 19 18 11   Temp:    (!) 97 F (36.1 C)  TempSrc:    Axillary  SpO2: 90% 96% 91% 97%  Weight:   48.9 kg   Height:        Intake/Output Summary (Last 24 hours) at 10/03/2023 1006 Last data filed at 10/03/2023 0800 Gross per 24 hour  Intake 960 ml  Output 1750 ml  Net -790 ml   Filed Weights   10/01/23 0600 10/02/23 0622 10/03/23 0600  Weight: 48.7 kg 49.4 kg 48.9 kg    Physical Exam    GEN- thin man lying in bed NAD, Alert and oriented  Lungs- Clear to ausculation bilaterally, normal work of breathing Cardiac- Regular rate and rhythm, no murmurs, rubs or gallops GI- soft, NT, ND, + BS Extremities- no clubbing or cyanosis. No edema  Telemetry    CHB 60-70s (personally reviewed)  Hospital Course    Christopher Farrell is a 63 y.o. 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 EP consulted for CHB post MVR, TVr  Assessment & Plan    #) CHB #) s/p MVR, TVr  Remains in CHB. Asymptomatic with narrow QRS and variable rates with activity Anticipate needing PPM, will discuss further with CTS Pending updated TTE today     For questions or updates, please contact [facility] Please consult [email or web address] for contact info under     Signed, Ridgely Anastacio, NP  10/03/2023, 10:06 AM

## 2023-10-03 NOTE — Progress Notes (Addendum)
 Advanced Heart Failure Rounding Note  Cardiologist: Annabella Scarce, MD  Consulting AHF MD: Dr. Gardenia Chief Complaint: s/p MVR & tricuspid repair Subjective:    - Milrinone  turned off yesterday. Co-ox 58% - Remains in junctional rhythm. Off digoxin  - -1.7L UOP with 40 IV lasix  yesterday. Renal function WNL  Feels fine. Has been ambulating and using incentive spirometer.   Objective:   Weight Range: 48.9 kg Body mass index is 18.5 kg/m.   Vital Signs:   Temp:  [98.4 F (36.9 C)-98.7 F (37.1 C)] 98.6 F (37 C) (07/28 2000) Pulse Rate:  [58-84] 61 (07/29 0800) Resp:  [11-30] 11 (07/29 0800) BP: (81-142)/(57-84) 95/65 (07/29 0800) SpO2:  [88 %-97 %] 97 % (07/29 0800) Weight:  [48.9 kg] 48.9 kg (07/29 0600) Last BM Date : 10/03/23  Weight change: Filed Weights   10/01/23 0600 10/02/23 0622 10/03/23 0600  Weight: 48.7 kg 49.4 kg 48.9 kg   Intake/Output:   Intake/Output Summary (Last 24 hours) at 10/03/2023 0843 Last data filed at 10/03/2023 0800 Gross per 24 hour  Intake 961.89 ml  Output 1750 ml  Net -788.11 ml    Physical Exam   General:  elderly / frail appearing.  No respiratory difficulty Neck: JVD ~7 cm.  Cor: Regular rate & rhythm. No murmurs. Lungs: clear Extremities: no edema  Neuro: alert & oriented x 3. Affect flat.   Telemetry   CHB with junctional escape rhythm 70s (Personally reviewed)    Labs    CBC Recent Labs    10/01/23 0425 10/02/23 1120  WBC 9.9 12.8*  HGB 10.8* 9.1*  HCT 32.2* 27.5*  MCV 95.8 96.5  PLT 69* 104*   Basic Metabolic Panel Recent Labs    92/73/74 1700 10/01/23 0425 10/02/23 0838 10/03/23 0423  NA 137   < > 132* 139  K 4.2   < > 3.8 3.7  CL 102   < > 97* 101  CO2 24   < > 25 28  GLUCOSE 141*   < > 170* 121*  BUN 11   < > 12 17  CREATININE 0.56*   < > 0.63 0.63  CALCIUM  9.1   < > 8.6* 8.7*  MG 1.9  --   --   --    < > = values in this interval not displayed.   Liver Function Tests No results  for input(s): AST, ALT, ALKPHOS, BILITOT, PROT, ALBUMIN  in the last 72 hours. No results for input(s): LIPASE, AMYLASE in the last 72 hours. Cardiac Enzymes No results for input(s): CKTOTAL, CKMB, CKMBINDEX, TROPONINI in the last 72 hours.  BNP: BNP (last 3 results) Recent Labs    12/16/22 1047 12/27/22 1114 05/02/23 1005  BNP 1,671* 682.4* 733.1*    ProBNP (last 3 results) No results for input(s): PROBNP in the last 8760 hours.   D-Dimer No results for input(s): DDIMER in the last 72 hours. Hemoglobin A1C No results for input(s): HGBA1C in the last 72 hours. Fasting Lipid Panel No results for input(s): CHOL, HDL, LDLCALC, TRIG, CHOLHDL, LDLDIRECT in the last 72 hours. Thyroid Function Tests No results for input(s): TSH, T4TOTAL, T3FREE, THYROIDAB in the last 72 hours.  Invalid input(s): FREET3  Other results:   Imaging    No results found.   Medications:     Scheduled Medications:  acetaminophen   1,000 mg Oral Q6H   Or   acetaminophen  (TYLENOL ) oral liquid 160 mg/5 mL  1,000 mg Per Tube Q6H   aspirin   EC  325 mg Oral Daily   Or   aspirin   324 mg Per Tube Daily   bictegravir-emtricitabine -tenofovir  AF  1 tablet Oral Daily   bisacodyl   10 mg Oral Daily   Or   bisacodyl   10 mg Rectal Daily   Chlorhexidine  Gluconate Cloth  6 each Topical Daily   docusate sodium   200 mg Oral Daily   escitalopram   20 mg Oral Daily   Fe Fum-Vit C-Vit B12-FA  1 capsule Oral QPC lunch   pantoprazole   40 mg Oral Daily   potassium chloride   20 mEq Oral TID   sodium chloride  flush  3 mL Intravenous Q12H    Infusions:    PRN Medications: albuterol , dextrose , ondansetron  (ZOFRAN ) IV, mouth rinse, oxyCODONE , sodium chloride  flush, traMADol     Patient Profile   Christopher Farrell is a 63 y.o. with history of HIV, mitral valve endocarditis diagnosed in July 2024, CVA, history of bacterial meningitis due to strep pneumonia  currently in the CVICU postoperatively after mitral valve replacement (31 mm Medtronic Mosaic porcine valve), tricuspid valve repair by Dr. Lucas.   Assessment/Plan  Severe mitral regurgitation s/p MVR + tricuspid valve repair  - Mitral endocarditis diagnosed in 7/24 s/p IV antibiotics  - MVR on 09/29/23 with 31mm Medtronic Mosaic porcine valve - Tricuspid ring repair with 30mm Edwards MC3 annuloplastly ring - Patient did well intra-op and post-operatively ;extubated without complications - Reviewed intra-op TEE images: LVEF 40% due to pacing/ectopy & septal dyssynchrony; RV function mod-severely reduced. LVOT VTI 17.6cm.  - Co-ox 58%.  - Volume slightly elevated, still up 2lbs from pre-op weight. Will give lasix  40 mg x1 again today.    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  - Stable off Epi - Off digoxin  2/2 rhythm  - Milrinone  turned off 7/28, stable.   3. CHB Accelerated junctional rhythm - now off digoxin  - Per TCTS, EP consulted 7/28. Following. No immediate plans to treat at this time.  - Still with pacing wires. Plan to transfer to PCU when wires are out.    Length of Stay: 4  Beckey LITTIE Coe, NP  10/03/2023, 8:43 AM  Advanced Heart Failure Team Pager [phone] (M-F; 7a - 5p)  Please contact [facility] for night-coverage after hours (5p -7a ) and weekends on [email or web address]   Patient seen with NP, I formulated the plan and agree with the above note.   Patient was initially in sinus rhythm with complete heart block and accelerated junctional escape in 60s as he was yesterday.  However, later this morning he has gone in to atrial fibrillation with regular ventricular rate around 60.   No dyspnea.  I/Os net negative 1026 and weight down.   General: Thin, NAD Neck: JVP 8 cm, no  thyromegaly or thyroid nodule.  Lungs: Clear to auscultation bilaterally with normal respiratory effort. CV: Nondisplaced PMI.  Heart regular S1/S2, no S3/S4, no murmur.  No peripheral edema.    Abdomen: Soft, nontender, no hepatosplenomegaly, no distention.  Skin: Intact without lesions or rashes.  Neurologic: Alert and oriented x 3.  Psych: Normal affect. Extremities: No clubbing or cyanosis.  HEENT: Normal.   Volume status mildly elevated, will give Lasix  40 mg IV x 1 today and add spironolactone  12.5 mg daily. Co-ox 58% off milrinone .  Now that he is off milrinone , would like to get complete echocardiogram to assess LV and valve function.   Patient had been in NSR with CHB and accelerated junctional escape.  This morning, he went into atrial fibrillation with regular ventricular rate in 60s.  He is off nodal blockade.  - Continue to follow, hemodynamically stable.  EP consulting, following for PPM need.  - We had not planned to anticoagulate him with bioprosthetic valve due to concerns about compliance.  However, now with atrial fibrillation, would favor anticoagulation with Eliquis .  Discuss with Dr. Lucas.    Mobilize.   CRITICAL CARE Performed by: Ezra Shuck  Total critical care time: 35 minutes  Critical care time was exclusive of separately billable procedures and treating other patients.  Critical care was necessary to treat or prevent imminent or life-threatening deterioration.  Critical care was time spent personally by me on the following activities: development of treatment plan with patient and/or surrogate as well as nursing, discussions with consultants, evaluation of patient's response to treatment, examination of patient, obtaining history from patient or surrogate, ordering and performing treatments and interventions, ordering and review of laboratory studies, ordering and review of radiographic studies, pulse oximetry and re-evaluation of patient's  condition.  Ezra Shuck 10/03/2023 10:42 AM

## 2023-10-03 NOTE — Progress Notes (Signed)
 EVENING ROUNDS NOTE :     [address]       [place] [address]             [phone]                 4 Days Post-Op Procedure(s) (LRB): REPLACEMENT, MITRAL VALVE USING MEDTRONIC MOSAIC PORCINE HEART VALVE (N/A) TRICUSPID VALVE REPAIR USING EDWARDS MC3 ANNULOPLASTY RING (N/A) ECHOCARDIOGRAM, TRANSESOPHAGEAL (N/A) CLIPPING, LEFT ATRIAL APPENDAGE USING ATRICURE ATRICLIP PRO2   Total Length of Stay:  LOS: 4 days  Events:   No events Remains junctional, but good escape EP on board    BP 96/76   Pulse 92   Temp (!) 97 F (36.1 C) (Axillary)   Resp 17   Ht 5' 4 (1.626 m)   Wt 48.9 kg   SpO2 99%   BMI 18.50 kg/m   CVP:  [2 mmHg-9 mmHg] 2 mmHg      sodium chloride       I/O last 3 completed shifts: In: 746.9 [P.O.:720; I.V.:26.9] Out: 2325 [Urine:2325]      Latest Ref Rng & Units 10/02/2023   11:20 AM 10/01/2023    4:25 AM 09/30/2023    5:00 PM  CBC  WBC 4.0 - 10.5 K/uL 12.8  9.9  12.8   Hemoglobin 13.0 - 17.0 g/dL 9.1  89.1  89.6   Hematocrit 39.0 - 52.0 % 27.5  32.2  30.0   Platelets 150 - 400 K/uL 104  69  89        Latest Ref Rng & Units 10/03/2023    4:23 AM 10/02/2023    8:38 AM 10/01/2023    4:25 AM  BMP  Glucose 70 - 99 mg/dL 878  829  893   BUN 8 - 23 mg/dL 17  12  12    Creatinine 0.61 - 1.24 mg/dL 9.36  9.36  9.24   Sodium 135 - 145 mmol/L 139  132  137   Potassium 3.5 - 5.1 mmol/L 3.7  3.8  4.5   Chloride 98 - 111 mmol/L 101  97  103   CO2 22 - 32 mmol/L 28  25  26    Calcium  8.9 - 10.3 mg/dL 8.7  8.6  8.7     ABG    Component Value Date/Time   PHART 7.370 09/29/2023 1846   PCO2ART 43.2 09/29/2023 1846   PO2ART 109 (H) 09/29/2023 1846   HCO3 24.8 09/29/2023 1846   TCO2 26 09/29/2023 1846   ACIDBASEDEF 1.0 09/29/2023 1747   O2SAT 58.2 10/03/2023 0423       Linnie Rayas, MD 10/03/2023 4:35 PM  ]

## 2023-10-03 NOTE — Progress Notes (Signed)
  Echocardiogram 2D Echocardiogram has been performed.  Tinnie FORBES Gosling RDCS 10/03/2023, 2:53 PM

## 2023-10-04 ENCOUNTER — Inpatient Hospital Stay (HOSPITAL_COMMUNITY)

## 2023-10-04 DIAGNOSIS — I361 Nonrheumatic tricuspid (valve) insufficiency: Secondary | ICD-10-CM

## 2023-10-04 DIAGNOSIS — I959 Hypotension, unspecified: Secondary | ICD-10-CM

## 2023-10-04 LAB — GLUCOSE, CAPILLARY
Glucose-Capillary: 145 mg/dL — ABNORMAL HIGH (ref 70–99)
Glucose-Capillary: 106 mg/dL — ABNORMAL HIGH (ref 70–99)

## 2023-10-04 LAB — CBC
HCT: 27.6 % — ABNORMAL LOW (ref 39.0–52.0)
Hemoglobin: 9.4 g/dL — ABNORMAL LOW (ref 13.0–17.0)
MCH: 32.4 pg (ref 26.0–34.0)
MCHC: 34.1 g/dL (ref 30.0–36.0)
MCV: 95.2 fL (ref 80.0–100.0)
Platelets: 191 K/uL (ref 150–400)
RBC: 2.9 MIL/uL — ABNORMAL LOW (ref 4.22–5.81)
RDW: 13.3 % (ref 11.5–15.5)
WBC: 8.5 K/uL (ref 4.0–10.5)
nRBC: 0 % (ref 0.0–0.2)

## 2023-10-04 LAB — AEROBIC/ANAEROBIC CULTURE W GRAM STAIN (SURGICAL/DEEP WOUND)
Culture: NO GROWTH
Gram Stain: NONE SEEN

## 2023-10-04 LAB — BASIC METABOLIC PANEL WITH GFR
Anion gap: 8 (ref 5–15)
BUN: 22 mg/dL (ref 8–23)
CO2: 26 mmol/L (ref 22–32)
Calcium: 9.1 mg/dL (ref 8.9–10.3)
Chloride: 103 mmol/L (ref 98–111)
Creatinine, Ser: 0.54 mg/dL — ABNORMAL LOW (ref 0.61–1.24)
GFR, Estimated: >60 mL/min (ref >60)
Glucose, Bld: 142 mg/dL — ABNORMAL HIGH (ref 70–99)
Potassium: 4.5 mmol/L (ref 3.5–5.1)
Sodium: 137 mmol/L (ref 135–145)

## 2023-10-04 NOTE — Progress Notes (Signed)
 Mobility Specialist Progress Note:    10/04/23 1010  Mobility  Activity Pivoted/transferred from bed to chair  Level of Assistance Standby assist, set-up cues, supervision of patient - no hands on  Assistive Device Front wheel walker  Distance Ambulated (ft) 5 ft  Range of Motion/Exercises Active  Activity Response Tolerated well  Mobility Referral Yes  Mobility visit 1 Mobility  Mobility Specialist Start Time (ACUTE ONLY) 1010  Mobility Specialist Stop Time (ACUTE ONLY) 1018  Mobility Specialist Time Calculation (min) (ACUTE ONLY) 8 min   Pt reluctant and agreeable to session. Pt appreciated a break down on the importance of getting up. Pt able to keep w/ his precautions very well. No c/o any symptoms. Pt left in recliner w/ all needs met.   Venetia Keel Mobility Specialist Please Neurosurgeon or Rehab Office at [phone]

## 2023-10-04 NOTE — Progress Notes (Signed)
  Patient Name: Christopher Farrell Date of Encounter: 10/04/2023  Primary Cardiologist: Annabella Scarce, MD Electrophysiologist: None  Interval Summary   NAEON.  Transferred to the floor overnight. Continues to walk without dizziness, LH, presyncope.    Vital Signs    Vitals:   10/03/23 2019 10/03/23 2316 10/04/23 0824 10/04/23 1231  BP: 96/80 90/67 96/72  99/73  Pulse: 97 99 95 99  Resp: 20 20 (!) 21 18  Temp: 98.7 F (37.1 C) 99.1 F (37.3 C)  98.3 F (36.8 C)  TempSrc: Oral Oral  Oral  SpO2: 100% 97% 97%   Weight:      Height:        Intake/Output Summary (Last 24 hours) at 10/04/2023 1613 Last data filed at 10/04/2023 1500 Gross per 24 hour  Intake 240 ml  Output 720 ml  Net -480 ml   Filed Weights   10/01/23 0600 10/02/23 0622 10/03/23 0600  Weight: 48.7 kg 49.4 kg 48.9 kg    Physical Exam    GEN- thin man lying in bed NAD, Alert and oriented  Lungs- Clear to ausculation bilaterally, normal work of breathing Cardiac- central chest incision appears c/d/I. Regular rate and rhythm, no murmurs, rubs or gallops GI- soft, NT, ND, + BS Extremities- no clubbing or cyanosis. No edema  Telemetry    Sinus with 1st deg HB rates 90s Intermittent wenckebach with rates in 70s (personally reviewed)  Hospital Course    Christopher Farrell is a 63 y.o. 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 EP consulted for CHB post MVR, TVr  Assessment & Plan    #) intermittent wenckeback, asymptomatic #) sinus 1st deg HB #) s/p MVR, TVr  Conduction system continues to improve Vast majority of time he is in sinus with 1st deg HB with rates in 90s Rare wenkebach He remains asymptomatic with walking and activity  EP will continue to follow     For questions or updates, please contact [facility] Please consult [email or web address] for contact info under     Signed, Gaila Engebretsen, NP  10/04/2023, 4:13  PM

## 2023-10-04 NOTE — Progress Notes (Signed)
 Patient transferring out of the ICU. EF recovered. AHF team will not continue to follow and patient does not need to follow up with us  in clinic. [facility] to take over moving forward.   Beckey Coe, AGACNP-BC  Advanced Heart Failure Team

## 2023-10-04 NOTE — Progress Notes (Addendum)
 Rounding Note   Patient Name: Christopher Farrell Date of Encounter: 10/04/2023  [facility] Cardiologist: Annabella Scarce, MD   Subjective Doing okay today.  Denied lightheadedness/dizziness, shortness of breath, orthopnea, PND,nausea, and peripheral edema   Scheduled Meds:  acetaminophen   1,000 mg Oral Q6H   Or   acetaminophen  (TYLENOL ) oral liquid 160 mg/5 mL  1,000 mg Per Tube Q6H   aspirin  EC  325 mg Oral Daily   Or   aspirin   324 mg Per Tube Daily   bictegravir-emtricitabine -tenofovir  AF  1 tablet Oral Daily   bisacodyl   10 mg Oral Daily   Or   bisacodyl   10 mg Rectal Daily   Chlorhexidine  Gluconate Cloth  6 each Topical Daily   docusate sodium   200 mg Oral Daily   escitalopram   20 mg Oral Daily   Fe Fum-Vit C-Vit B12-FA  1 capsule Oral QPC lunch   pantoprazole   40 mg Oral Daily   sodium chloride  flush  3 mL Intravenous Q12H   sodium chloride  flush  3 mL Intravenous Q12H   spironolactone   12.5 mg Oral Daily   Continuous Infusions:  sodium chloride      PRN Meds: sodium chloride , albuterol , dextrose , ondansetron  (ZOFRAN ) IV, mouth rinse, oxyCODONE , sodium chloride  flush, sodium chloride  flush, traMADol    Vital Signs  Vitals:   10/03/23 1900 10/03/23 2019 10/03/23 2316 10/04/23 0824  BP: 96/80 96/80 90/67  96/72  Pulse: 69 97 99 95  Resp: 20 20 20  (!) 21  Temp: 98.7 F (37.1 C) 98.7 F (37.1 C) 99.1 F (37.3 C)   TempSrc: Oral Oral Oral   SpO2: 96% 100% 97% 97%  Weight:      Height:        Intake/Output Summary (Last 24 hours) at 10/04/2023 0952 Last data filed at 10/04/2023 0927 Gross per 24 hour  Intake 10 ml  Output 1720 ml  Net -1710 ml      10/03/2023    6:00 AM 10/02/2023    6:22 AM 10/01/2023    6:00 AM  Last 3 Weights  Weight (lbs) 107 lb 12.9 oz 109 lb 107 lb 4.8 oz  Weight (kg) 48.9 kg 49.442 kg 48.671 kg      Telemetry Accelerated Junctional tachycardia vs atrial flutter HR 95 since 0538 - Personally Reviewed  12 lead  pending  Physical Exam GEN: No acute distress.   Neck: Unable to assess JVD given dressing from recently removed IJ Cardiac: RRR, no murmurs, rubs, or gallops.  Respiratory: Diminished at bases MS: No edema; No deformity. Neuro:  Nonfocal  Psych: Normal affect   Labs High Sensitivity Troponin:  No results for input(s): TROPONINIHS in the last 720 hours.   Chemistry Recent Labs  Lab 09/28/23 1015 09/29/23 0749 09/29/23 1844 09/29/23 1846 09/30/23 0454 09/30/23 1700 10/01/23 0425 10/02/23 0838 10/03/23 0423 10/04/23 0314  NA 139   < > 142   < > 138 137   < > 132* 139 137  K 4.7   < > 4.1   < > 3.9 4.2   < > 3.8 3.7 4.5  CL 103   < > 110  --  110 102   < > 97* 101 103  CO2 26  --  24  --  22 24   < > 25 28 26   GLUCOSE 84   < > 123*  --  103* 141*   < > 170* 121* 142*  BUN 5*   < > 10  --  10 11   < > 12 17 22   CREATININE 0.69   < > 0.78  --  0.57* 0.56*   < > 0.63 0.63 0.54*  CALCIUM  9.5  --  7.7*  --  7.6* 9.1   < > 8.6* 8.7* 9.1  MG  --   --  3.6*  --  2.3 1.9  --   --   --   --   PROT 7.7  --   --   --   --   --   --   --   --   --   ALBUMIN  3.9  --   --   --   --   --   --   --   --   --   AST 37  --   --   --   --   --   --   --   --   --   ALT 21  --   --   --   --   --   --   --   --   --   ALKPHOS 87  --   --   --   --   --   --   --   --   --   BILITOT 1.1  --   --   --   --   --   --   --   --   --   GFRNONAA >60  --  >60  --  >60 >60   < > >60 >60 >60  ANIONGAP 10  --  8  --  6 11   < > 10 10 8    < > = values in this interval not displayed.    Lipids No results for input(s): CHOL, TRIG, HDL, LABVLDL, LDLCALC, CHOLHDL in the last 168 hours.  Hematology Recent Labs  Lab 10/01/23 0425 10/02/23 1120 10/04/23 0314  WBC 9.9 12.8* 8.5  RBC 3.36* 2.85* 2.90*  HGB 10.8* 9.1* 9.4*  HCT 32.2* 27.5* 27.6*  MCV 95.8 96.5 95.2  MCH 32.1 31.9 32.4  MCHC 33.5 33.1 34.1  RDW 13.6 13.5 13.3  PLT 69* 104* 191   Thyroid No results for input(s): TSH,  FREET4 in the last 168 hours.  BNPNo results for input(s): BNP, PROBNP in the last 168 hours.  DDimer No results for input(s): DDIMER in the last 168 hours.   Radiology  DG Chest 2 View Result Date: 10/04/2023 CLINICAL DATA:  [id number] S/P MVR (mitral valve replacement) [id number] EXAM: CHEST - 2 VIEW COMPARISON:  10/01/2023. FINDINGS: Interval removal of right IJ sheath. Stable cardiomediastinal contours. Mitral and tricuspid valve repairs. Intact median sternotomy. Minimal right basilar atelectasis. No focal consolidation, pleural effusion, or pneumothorax. No acute osseous abnormality. IMPRESSION: Minimal right basilar atelectasis. Otherwise, no acute cardiopulmonary findings. Electronically Signed   By: Harrietta Sherry M.D.   On: 10/04/2023 08:22   ECHOCARDIOGRAM COMPLETE Result Date: 10/03/2023    ECHOCARDIOGRAM REPORT   Patient Name:   Christopher Farrell Date of Exam: 10/03/2023 Medical Rec #:  [id number]          Height:       64.0 in Accession #:    [id number]         Weight:       107.8 lb Date of Birth:  04/20/1960          BSA:          1.504  m Patient Age:    62 years           BP:           105/69 mmHg Patient Gender: M                  HR:           65 bpm. Exam Location:  Inpatient Procedure: 2D Echo, Color Doppler and Cardiac Doppler (Both Spectral and Color            Flow Doppler were utilized during procedure). Indications:     CHF-Acute Systolic I50.21  History:         Patient has prior history of Echocardiogram examinations, most                  recent 09/29/2023.  Sonographer:     Tinnie Gosling RDCS Referring Phys:  [id number] EZRA GORMAN SHUCK Diagnosing Phys: Stanly Leavens MD IMPRESSIONS  1. Small neo-LVOT with flow acceleration without evidence of Spectral Doppler gradient. Left ventricular ejection fraction, by estimation, is 50 to 55%. The left ventricle has low normal function. The left ventricle has no regional wall motion abnormalities. There is mild concentric left ventricular  hypertrophy. Left ventricular diastolic parameters are indeterminate.  2. Right ventricular systolic function is moderately reduced. The right ventricular size is moderately enlarged.  3. PHT < 130 ms. TVI ratio greater than 2.2 but without visual evidence of pathologic regurgitation; 31 mm Medtronic Mosaic porcine valve. The mitral valve has been repaired/replaced. No evidence of mitral valve regurgitation. The mean mitral valve gradient is 3.8 mmHg with average heart rate of 63 bpm.  4. The aortic valve was not well visualized. Aortic valve regurgitation is not visualized. No aortic stenosis is present.  5. The inferior vena cava is dilated in size with <50% respiratory variability, suggesting right atrial pressure of 15 mmHg. Comparison(s): Prior images reviewed side by side. Mitral valve has been been replaced with improved tricuspid regurgitation as well; post surgery the right ventricle is less vigorous. FINDINGS  Left Ventricle: Small neo-LVOT with flow acceleration without evidence of Spectral Doppler gradient. Left ventricular ejection fraction, by estimation, is 50 to 55%. The left ventricle has low normal function. The left ventricle has no regional wall motion abnormalities. The left ventricular internal cavity size was normal in size. There is mild concentric left ventricular hypertrophy. Left ventricular diastolic parameters are indeterminate. Right Ventricle: The right ventricular size is moderately enlarged. No increase in right ventricular wall thickness. Right ventricular systolic function is moderately reduced. Left Atrium: Left atrial size was normal in size. Right Atrium: Right atrial size was normal in size. Pericardium: There is no evidence of pericardial effusion. Mitral Valve: PHT < 130 ms. TVI ratio greater than 2.2 but without visual evidence of pathologic regurgitation; 31 mm Medtronic Mosaic porcine valve. The mitral valve has been repaired/replaced. No evidence of mitral valve  regurgitation. There is a 31 mm  present in the mitral position. The mean mitral valve gradient is 3.8 mmHg with average heart rate of 63 bpm. Tricuspid Valve: The tricuspid valve is normal in structure. Tricuspid valve regurgitation is not demonstrated. No evidence of tricuspid stenosis. Aortic Valve: The aortic valve was not well visualized. Aortic valve regurgitation is not visualized. No aortic stenosis is present. Pulmonic Valve: The pulmonic valve was not well visualized. Pulmonic valve regurgitation is not visualized. No evidence of pulmonic stenosis. Aorta: The aortic root and ascending aorta are structurally normal, with no  evidence of dilitation. Venous: The inferior vena cava is dilated in size with less than 50% respiratory variability, suggesting right atrial pressure of 15 mmHg. IAS/Shunts: The atrial septum is grossly normal.  LEFT VENTRICLE PLAX 2D LVIDd:         3.80 cm   Diastology LVIDs:         3.00 cm   LV e' medial:    4.90 cm/s LV PW:         1.20 cm   LV E/e' medial:  25.3 LV IVS:        1.20 cm   LV e' lateral:   5.22 cm/s LVOT diam:     1.70 cm   LV E/e' lateral: 23.8 LV SV:         32 LV SV Index:   21 LVOT Area:     2.27 cm  IVC IVC diam: 2.00 cm LEFT ATRIUM           Index LA diam:      3.70 cm 2.46 cm/m LA Vol (A4C): 38.7 ml 25.72 ml/m  AORTIC VALVE LVOT Vmax:   90.30 cm/s LVOT Vmean:  65.000 cm/s LVOT VTI:    0.139 m  AORTA Ao Root diam: 2.60 cm Ao Asc diam:  3.20 cm MITRAL VALVE MV Area (PHT): 2.14 cm     SHUNTS MV Mean grad:  3.8 mmHg     Systemic VTI:  0.14 m MV Decel Time: 354 msec     Systemic Diam: 1.70 cm MV E velocity: 124.00 cm/s MV A velocity: 62.30 cm/s MV E/A ratio:  1.99 Stanly Leavens MD Electronically signed by Stanly Leavens MD Signature Date/Time: 10/03/2023/3:19:39 PM    Final (Updated)    Patient Profile   63 y.o. 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 Lucas.   Assessment & Plan  Severe mitral regurgitation s/p MV replacement Moderate tricuspid regurgitation s/p TV repair MVR on 09/29/23 with a 31 mm Medtronic Mosaic porcine valve Tricuspid valve repair with 30 mm Edwards MC3 annuloplasty ring  LAA clipping Post echocardiogram shows TVI ratio > 2.2 without visual evidence of mitral regurgitation, mean mitral valve gradient 3.8 mmHg. No tricuspid regurgitation. CT surgery following  Continue on ASA 325mg   Hx Hypotension Prior to admission patient was on midodrine  5 mg TID BP 96/72, over the last 24 hours BP variable 89/73 -> 142/76 Patient asymptomatic and mainly sitting in chair/laying in bed Will hold off on restarting midodrine  at this time  Pulmonary Hypertension RV failure Elevated systemic PA pressures noted intraoperatively [ PASP ~80 mmHg] Suspected to be Type II 2/2 severe MR Post echocardiogram shows LVEF 50-55% with no RWMA. Moderately enlarged RV with reduced systolic function.  CXR this am showed minimal right basilar atelectasis, no evidence of vascular congestion. Currently net - 3.1L this admission On exam patient does not appear volume up.   Would hold off on IV diuresis Continue spironolactone  12.5 mg with hold parameters for BP Continue to monitor K, requiring repletion x 3 yesterday (7/29)  CHB vs High grade AV block Atrial Flutter [HR 95] EP following, plan to watch him for 24 hours, and if he remains stable will puruse zio patch  at discharge No plans to pursue PPM  Per primary HIV Chronic normocytic anemia   For questions or updates, please contact [facility] Please consult [email or web address] for contact info under     Signed, Leontine LOISE Salen, PA-C   10/04/2023, 9:52 AM    Personally seen and examined. Agree with above.  Sitting in chair.  Fairly tired.  Heart rate currently in the 90s.  No further heart block.  HIV, prior mitral valve endocarditis after stopping his medications.  No pacemaker needs at this point.  Dr. Cindie has been following with EP.  Zio monitor on discharge.  May need baseline midodrine  5 mg 3 times a day.  Pressure is usually soft.  Last clinic visit in February rate was 95 systolic similar to what we are seeing today.  Oneil Parchment, MD

## 2023-10-04 NOTE — Progress Notes (Signed)
 CARDIAC REHAB PHASE I   PRE:  Rate/Rhythm: 96 JR    BP: sitting 99/78    SpO2: 96 RA  MODE:  Ambulation: 470 ft   POST:  Rate/Rhythm: 100 JR    BP: sitting 98/80     SpO2: 100 RA   Pt tolerated well, no c/o. Able to stand independently with verbal cues for sternal precautions. Ambulated with standby assist, no AD. Pt could walk more independently if he didn't have equipment to carry.   Discussed with pt and mother IS use and sternal precautions. Encouraged x3 walks a day and sitting up. Gave pt Ensure at request. Will f/u for education closer to d/c.  [phone]  Aliene Aris BS, ACSM-CEP 10/04/2023 11:13 AM

## 2023-10-04 NOTE — Progress Notes (Addendum)
      [address]       [place] [address]             [phone]      5 Days Post-Op Procedure(s) (LRB): REPLACEMENT, MITRAL VALVE USING MEDTRONIC MOSAIC PORCINE HEART VALVE (N/A) TRICUSPID VALVE REPAIR USING EDWARDS MC3 ANNULOPLASTY RING (N/A) ECHOCARDIOGRAM, TRANSESOPHAGEAL (N/A) CLIPPING, LEFT ATRIAL APPENDAGE USING ATRICURE ATRICLIP PRO2  Subjective:  Patient without complaints.  He has not yet ambulated in the hallways since transferring.  He has moved his bowels since surgery.  Has help at home without DME request.  Objective: Vital signs in last 24 hours: Temp:  [98.7 F (37.1 C)-99.1 F (37.3 C)] 99.1 F (37.3 C) (07/29 2316) Pulse Rate:  [65-99] 99 (07/29 2316) Cardiac Rhythm: Atrial flutter (07/30 0343) Resp:  [13-22] 20 (07/29 2316) BP: (89-107)/(63-86) 90/67 (07/29 2316) SpO2:  [94 %-100 %] 97 % (07/29 2316)  Hemodynamic parameters for last 24 hours: CVP:  [2 mmHg] 2 mmHg  Intake/Output from previous day: 07/29 0701 - 07/30 0700 In: 250 [P.O.:240; I.V.:10] Out: 1300 [Urine:1300]  General appearance: alert, cooperative, and no distress Heart: irregular rate and rhythm Lungs: clear to auscultation bilaterally Abdomen: soft, non-tender; bowel sounds normal Extremities: no edema Wound: clean and dry  Lab Results: Recent Labs    10/02/23 1120 10/04/23 0314  WBC 12.8* 8.5  HGB 9.1* 9.4*  HCT 27.5* 27.6*  PLT 104* 191   BMET:  Recent Labs    10/03/23 0423 10/04/23 0314  NA 139 137  K 3.7 4.5  CL 101 103  CO2 28 26  GLUCOSE 121* 142*  BUN 17 22  CREATININE 0.63 0.54*  CALCIUM  8.7* 9.1    PT/INR: No results for input(s): LABPROT, INR in the last 72 hours. ABG    Component Value Date/Time   PHART 7.370 09/29/2023 1846   HCO3 24.8 09/29/2023 1846   TCO2 26 09/29/2023 1846   ACIDBASEDEF 1.0 09/29/2023 1747   O2SAT 58.2 10/03/2023 0423   CBG (last 3)  Recent Labs    10/02/23 0614 10/02/23 1126  10/02/23 1607  GLUCAP 97 114* 127*    Assessment/Plan: S/P Procedure(s) (LRB): REPLACEMENT, MITRAL VALVE USING MEDTRONIC MOSAIC PORCINE HEART VALVE (N/A) TRICUSPID VALVE REPAIR USING EDWARDS MC3 ANNULOPLASTY RING (N/A) ECHOCARDIOGRAM, TRANSESOPHAGEAL (N/A) CLIPPING, LEFT ATRIAL APPENDAGE USING ATRICURE ATRICLIP PRO2  CV- post operative CHB, now in atrial flutter 90's. EP wants to continue observation for another day or two before deciding about need for PPM. Will plan to send home on Eliquis  for a while but wait to start until decision is made about need for PPM. Pulm- no acute issues, not requiring oxygen, continue IS Renal- creatinine has been stable, weight not done yet today. -1050 for 24 hrs. 4.  HIV- continue home antivirals 5.  Previously treated Endocarditis-- OR cultures remain NGTD  Await EP Recs, needs to ambulate, continue current care  LOS: 5 days    Rocky Shad, PA-C 10/04/2023

## 2023-10-05 ENCOUNTER — Other Ambulatory Visit (HOSPITAL_COMMUNITY): Payer: Self-pay

## 2023-10-05 ENCOUNTER — Telehealth (HOSPITAL_COMMUNITY): Payer: Self-pay | Admitting: Pharmacy Technician

## 2023-10-05 DIAGNOSIS — I349 Nonrheumatic mitral valve disorder, unspecified: Secondary | ICD-10-CM

## 2023-10-05 LAB — GLUCOSE, CAPILLARY
Glucose-Capillary: 148 mg/dL — ABNORMAL HIGH (ref 70–99)
Glucose-Capillary: 153 mg/dL — ABNORMAL HIGH (ref 70–99)
Glucose-Capillary: 117 mg/dL — ABNORMAL HIGH (ref 70–99)
Glucose-Capillary: 132 mg/dL — ABNORMAL HIGH (ref 70–99)
Glucose-Capillary: 112 mg/dL — ABNORMAL HIGH (ref 70–99)

## 2023-10-05 LAB — CBC WITH DIFFERENTIAL/PLATELET
Abs Immature Granulocytes: 0.1 K/uL — ABNORMAL HIGH (ref 0.00–0.07)
Basophils Absolute: 0.1 K/uL (ref 0.0–0.1)
Basophils Relative: 1 %
Eosinophils Absolute: 0.3 K/uL (ref 0.0–0.5)
Eosinophils Relative: 4 %
HCT: 27.5 % — ABNORMAL LOW (ref 39.0–52.0)
Hemoglobin: 9.1 g/dL — ABNORMAL LOW (ref 13.0–17.0)
Immature Granulocytes: 1 %
Lymphocytes Relative: 28 %
Lymphs Abs: 2 K/uL (ref 0.7–4.0)
MCH: 31.8 pg (ref 26.0–34.0)
MCHC: 33.1 g/dL (ref 30.0–36.0)
MCV: 96.2 fL (ref 80.0–100.0)
Monocytes Absolute: 0.9 K/uL (ref 0.1–1.0)
Monocytes Relative: 13 %
Neutro Abs: 3.9 K/uL (ref 1.7–7.7)
Neutrophils Relative %: 53 %
Platelets: 248 K/uL (ref 150–400)
RBC: 2.86 MIL/uL — ABNORMAL LOW (ref 4.22–5.81)
RDW: 13.3 % (ref 11.5–15.5)
WBC: 7.2 K/uL (ref 4.0–10.5)
nRBC: 0 % (ref 0.0–0.2)

## 2023-10-05 LAB — BASIC METABOLIC PANEL WITH GFR
Anion gap: 9 (ref 5–15)
BUN: 20 mg/dL (ref 8–23)
CO2: 27 mmol/L (ref 22–32)
Calcium: 9.2 mg/dL (ref 8.9–10.3)
Chloride: 101 mmol/L (ref 98–111)
Creatinine, Ser: 0.59 mg/dL — ABNORMAL LOW (ref 0.61–1.24)
GFR, Estimated: >60 mL/min (ref >60)
Glucose, Bld: 107 mg/dL — ABNORMAL HIGH (ref 70–99)
Potassium: 4.5 mmol/L (ref 3.5–5.1)
Sodium: 137 mmol/L (ref 135–145)

## 2023-10-05 LAB — MAGNESIUM: Magnesium: 1.8 mg/dL (ref 1.7–2.4)

## 2023-10-05 LAB — TSH: TSH: 1.876 u[IU]/mL (ref 0.350–4.500)

## 2023-10-05 NOTE — Progress Notes (Signed)
   Rounding Note    Patient Name: Christopher Farrell Date of Encounter: 10/05/2023  [facility] Cardiologist: Annabella Scarce, MD   Subjective   NAEO. Pacing was turned on overnight for unclear reasons and he was tracking atrium. Now box is OFF.  Vital Signs    Vitals:   10/04/23 2018 10/04/23 2331 10/05/23 0411 10/05/23 0807  BP: 103/80 90/70 108/79 102/70  Pulse: 98 71 92 (!) 105  Resp: 20 20 20 16   Temp: 98.1 F (36.7 C) 98.7 F (37.1 C) 98.8 F (37.1 C) 98.2 F (36.8 C)  TempSrc: Oral Oral Oral Oral  SpO2: 98% 93% 93% 99%  Weight:      Height:        Intake/Output Summary (Last 24 hours) at 10/05/2023 0834 Last data filed at 10/05/2023 0618 Gross per 24 hour  Intake 340 ml  Output 1120 ml  Net -780 ml      10/03/2023    6:00 AM 10/02/2023    6:22 AM 10/01/2023    6:00 AM  Last 3 Weights  Weight (lbs) 107 lb 12.9 oz 109 lb 107 lb 4.8 oz  Weight (kg) 48.9 kg 49.442 kg 48.671 kg      Telemetry    Personally Reviewed  ECG    Sinus. Conduction appears intact. There was 1 episode of what appears to be wenckebach. -  Personally Reviewed  Physical Exam   GEN: No acute distress.   Cardiac: RRR, no murmurs, rubs, or gallops.  Respiratory: Clear to auscultation bilaterally. Psych: Normal affect   Assessment & Plan    #s/p MVr/TVr #post op conduction system disease Improving post op.  OK to d/c pacing wires. Anticipate discharge tomorrow with Zio monitor.  Discussed with Dr Lucas this AM who is in agreement.      Ole T. Cindie, MD, [facility], [facility] Cardiac Electrophysiology

## 2023-10-05 NOTE — Progress Notes (Signed)
 6 Days Post-Op Procedure(s) (LRB): REPLACEMENT, MITRAL VALVE USING MEDTRONIC MOSAIC PORCINE HEART VALVE (N/A) TRICUSPID VALVE REPAIR USING EDWARDS MC3 ANNULOPLASTY RING (N/A) ECHOCARDIOGRAM, TRANSESOPHAGEAL (N/A) CLIPPING, LEFT ATRIAL APPENDAGE USING ATRICURE ATRICLIP PRO2 Subjective: No complaints.  Someone turned his pacer on overnight to DDD 80 and he was sensing atrium and pacing ventricle at 100. I turned pacer off and he appeared to be in sinus rhythm with 1:1 conduction at 100 but some periods that look like heart block. I left pacer off.  Objective: Vital signs in last 24 hours: Temp:  [98.1 F (36.7 C)-98.8 F (37.1 C)] 98.8 F (37.1 C) (07/31 0411) Pulse Rate:  [71-99] 92 (07/31 0411) Cardiac Rhythm: Atrial fibrillation (07/30 0854) Resp:  [18-21] 20 (07/31 0411) BP: (90-108)/(70-80) 108/79 (07/31 0411) SpO2:  [93 %-99 %] 93 % (07/31 0411)  Hemodynamic parameters for last 24 hours:    Intake/Output from previous day: 07/30 0701 - 07/31 0700 In: 340 [P.O.:340] Out: 1120 [Urine:1120] Intake/Output this shift: Total I/O In: -  Out: 400 [Urine:400]  General appearance: alert and cooperative Neurologic: intact Heart: regular rate and rhythm Lungs: clear to auscultation bilaterally Extremities: no edema Wound: incision healing well  Lab Results: Recent Labs    10/04/23 0314 10/05/23 0308  WBC 8.5 7.2  HGB 9.4* 9.1*  HCT 27.6* 27.5*  PLT 191 248   BMET:  Recent Labs    10/04/23 0314 10/05/23 0308  NA 137 137  K 4.5 4.5  CL 103 101  CO2 26 27  GLUCOSE 142* 107*  BUN 22 20  CREATININE 0.54* 0.59*  CALCIUM  9.1 9.2    PT/INR: No results for input(s): LABPROT, INR in the last 72 hours. ABG    Component Value Date/Time   PHART 7.370 09/29/2023 1846   HCO3 24.8 09/29/2023 1846   TCO2 26 09/29/2023 1846   ACIDBASEDEF 1.0 09/29/2023 1747   O2SAT 58.2 10/03/2023 0423   CBG (last 3)  Recent Labs    10/02/23 1607 10/04/23 0817  10/04/23 1257  GLUCAP 127* 145* 106*    Assessment/Plan: S/P Procedure(s) (LRB): REPLACEMENT, MITRAL VALVE USING MEDTRONIC MOSAIC PORCINE HEART VALVE (N/A) TRICUSPID VALVE REPAIR USING EDWARDS MC3 ANNULOPLASTY RING (N/A) ECHOCARDIOGRAM, TRANSESOPHAGEAL (N/A) CLIPPING, LEFT ATRIAL APPENDAGE USING ATRICURE ATRICLIP PRO2  POD 6  Hemodynamically stable.  Conduction seems to be improving. Will see what EP thinks.  He is doing well otherwise.   Plan to start Eliquis  at discharge.   LOS: 6 days    Christopher Farrell 10/05/2023

## 2023-10-05 NOTE — Discharge Instructions (Addendum)
 Information on my medicine - ELIQUIS  (apixaban )  This medication education was reviewed with me or my healthcare representative as part of my discharge preparation.  Why was Eliquis  prescribed for you? Eliquis  was prescribed for you to reduce the risk of forming blood clots that can cause a stroke if you have a medical condition called atrial fibrillation (a type of irregular heartbeat) OR to reduce the risk of a blood clots forming after orthopedic surgery.  What do You need to know about Eliquis  ? Take your Eliquis  TWICE DAILY - one tablet in the morning and one tablet in the evening with or without food.  It would be best to take the doses about the same time each day.  If you have difficulty swallowing the tablet whole please discuss with your pharmacist how to take the medication safely.  Take Eliquis  exactly as prescribed by your doctor and DO NOT stop taking Eliquis  without talking to the doctor who prescribed the medication.  Stopping may increase your risk of developing a new clot or stroke.  Refill your prescription before you run out.  After discharge, you should have regular check-up appointments with your healthcare provider that is prescribing your Eliquis .  In the future your dose may need to be changed if your kidney function or weight changes by a significant amount or as you get older.  What do you do if you miss a dose? If you miss a dose, take it as soon as you remember on the same day and resume taking twice daily.  Do not take more than one dose of ELIQUIS  at the same time.  Important Safety Information A possible side effect of Eliquis  is bleeding. You should call your healthcare provider right away if you experience any of the following: Bleeding from an injury or your nose that does not stop. Unusual colored urine (red or dark brown) or unusual colored stools (red or black). Unusual bruising for unknown reasons. A serious fall or if you hit your head (even if  there is no bleeding).  Some medicines may interact with Eliquis  and might increase your risk of bleeding or clotting while on Eliquis . To help avoid this, consult your healthcare provider or pharmacist prior to using any new prescription or non-prescription medications, including herbals, vitamins, non-steroidal anti-inflammatory drugs (NSAIDs) and supplements.  This website has more information on Eliquis  (apixaban ): http://www.eliquis .com/eliquis dena

## 2023-10-05 NOTE — TOC Progression Note (Signed)
 Transition of Care [facility]) - Progression Note    Patient Details  Name: Christopher Farrell MRN: [id number] Date of Birth: 1960-04-27  [facility] of [facility]) CM/SW Contact  Justina Delcia Czar, RN Phone Number: [phone] 10/05/2023, 1:24 PM  Clinical Narrative:     Spoke to pt and offered choice for [facility] Medicare.gov listing with ratings given to patient and placed on chart. Pt states no DME needed.   Family will provide transportation home. Pt agreeable to [employer] for [facility] RN.   Expected Discharge Plan: Home/Self Care Barriers to Discharge: Continued Medical Work up     Expected Discharge Plan and Services   Discharge Planning Services: CM Consult Post Acute Care Choice: Home Health                                         Social Drivers of Health (SDOH) Interventions SDOH Screenings   Food Insecurity: No Food Insecurity (09/13/2022)  Housing: Patient Declined (09/13/2022)  Transportation Needs: No Transportation Needs (09/13/2022)  Utilities: Not At Risk (09/13/2022)  Depression (PHQ2-9): Low Risk  (02/01/2023)  Tobacco Use: High Risk (09/28/2023)    Readmission Risk Interventions    10/06/2022   11:54 AM  Readmission Risk Prevention Plan  Transportation Screening Complete  PCP or Specialist Appt within 5-7 Days Complete  Home Care Screening Complete  Medication Review (RN CM) Complete

## 2023-10-05 NOTE — Progress Notes (Signed)
 EP brief note   Tele reviewed, Aflutter in 90s When in sinus, appears SR with 1st deg HB   EP will sign off at this time, but remains available  Please reconsult if needed.    Shamaya Kauer, NP Electrophysiology 10/05/23 3:27 PM

## 2023-10-05 NOTE — Progress Notes (Signed)
 Mobility Specialist Progress Note:    10/05/23 1011  Mobility  Activity Ambulated independently  Level of Assistance Independent  Assistive Device None  Distance Ambulated (ft) 410 ft  RUE Weight Bearing Per Provider Order NWB  LUE Weight Bearing Per Provider Order NWB  Activity Response Tolerated well  Mobility Referral Yes  Mobility visit 1 Mobility  Mobility Specialist Start Time (ACUTE ONLY) 1011  Mobility Specialist Stop Time (ACUTE ONLY) 1021  Mobility Specialist Time Calculation (min) (ACUTE ONLY) 10 min   Pt received in chair, agreeable to mobility session. Ambulated in hallway, no AD required. Max HR 107 bpm. Tolerated well, asx throughout. Returned pt to room, sitting up in chair with all needs met.  Malin Cervini Mobility Specialist Please contact via Special educational needs teacher or  Rehab office at [phone]

## 2023-10-05 NOTE — Plan of Care (Signed)
  Problem: Education: Goal: Knowledge of General Education information will improve Description: Including pain rating scale, medication(s)/side effects and non-pharmacologic comfort measures Outcome: Progressing   Problem: Clinical Measurements: Goal: Ability to maintain clinical measurements within normal limits will improve Outcome: Progressing Goal: Will remain free from infection Outcome: Progressing Goal: Diagnostic test results will improve Outcome: Progressing Goal: Respiratory complications will improve Outcome: Progressing Goal: Cardiovascular complication will be avoided Outcome: Progressing   Problem: Activity: Goal: Risk for activity intolerance will decrease Outcome: Progressing   Problem: Nutrition: Goal: Adequate nutrition will be maintained Outcome: Progressing   Problem: Coping: Goal: Level of anxiety will decrease Outcome: Progressing   Problem: Elimination: Goal: Will not experience complications related to bowel motility Outcome: Progressing Goal: Will not experience complications related to urinary retention Outcome: Progressing   Problem: Pain Managment: Goal: General experience of comfort will improve and/or be controlled Outcome: Progressing   Problem: Safety: Goal: Ability to remain free from injury will improve Outcome: Progressing   Problem: Skin Integrity: Goal: Risk for impaired skin integrity will decrease Outcome: Progressing   Problem: Education: Goal: Will demonstrate proper wound care and an understanding of methods to prevent future damage Outcome: Progressing Goal: Knowledge of the prescribed therapeutic regimen will improve Outcome: Progressing   Problem: Activity: Goal: Risk for activity intolerance will decrease Outcome: Progressing   Problem: Cardiac: Goal: Will achieve and/or maintain hemodynamic stability Outcome: Progressing   Problem: Clinical Measurements: Goal: Postoperative complications will be avoided or  minimized Outcome: Progressing   Problem: Respiratory: Goal: Respiratory status will improve Outcome: Progressing   Problem: Skin Integrity: Goal: Wound healing without signs and symptoms of infection Outcome: Progressing   Problem: Urinary Elimination: Goal: Ability to achieve and maintain adequate renal perfusion and functioning will improve Outcome: Progressing   Problem: Coping: Goal: Ability to adjust to condition or change in health will improve Outcome: Progressing   Problem: Fluid Volume: Goal: Ability to maintain a balanced intake and output will improve Outcome: Progressing   Problem: Metabolic: Goal: Ability to maintain appropriate glucose levels will improve Outcome: Progressing   Problem: Nutritional: Goal: Maintenance of adequate nutrition will improve Outcome: Progressing   Problem: Skin Integrity: Goal: Risk for impaired skin integrity will decrease Outcome: Progressing   Problem: Tissue Perfusion: Goal: Adequacy of tissue perfusion will improve Outcome: Progressing

## 2023-10-05 NOTE — Telephone Encounter (Signed)
Patient Product/process development scientist completed.    The patient is insured through [facility] MEDICAID.     Ran test claim for Eliquis 5 mg and the current 30 day co-pay is $4.00.   This test claim was processed through [facility]- copay amounts may vary at other pharmacies due to pharmacy/plan contracts, or as the patient moves through the different stages of their insurance plan.     Roland Earl, CPHT Pharmacy Technician III Certified Patient Advocate [facility] Patient Advocate Team Direct Number: [phone]  Fax: [phone]

## 2023-10-05 NOTE — Progress Notes (Addendum)
 Rounding Note   Patient Name: Christopher Farrell Date of Encounter: 10/05/2023  [facility] Cardiologist: Annabella Scarce, MD   Subjective Doing fine today.  Denied lightheadedness/dizziness, palpitations, shortness of breath, orthopnea, PND, and peripheral edema. Chest pain from surgery is tolerable.   Scheduled Meds:  aspirin  EC  325 mg Oral Daily   Or   aspirin   324 mg Per Tube Daily   bictegravir-emtricitabine -tenofovir  AF  1 tablet Oral Daily   bisacodyl   10 mg Oral Daily   Or   bisacodyl   10 mg Rectal Daily   Chlorhexidine  Gluconate Cloth  6 each Topical Daily   docusate sodium   200 mg Oral Daily   escitalopram   20 mg Oral Daily   Fe Fum-Vit C-Vit B12-FA  1 capsule Oral QPC lunch   pantoprazole   40 mg Oral Daily   sodium chloride  flush  3 mL Intravenous Q12H   sodium chloride  flush  3 mL Intravenous Q12H   spironolactone   12.5 mg Oral Daily   Continuous Infusions:  PRN Meds: albuterol , dextrose , ondansetron  (ZOFRAN ) IV, mouth rinse, oxyCODONE , sodium chloride  flush, sodium chloride  flush, traMADol    Vital Signs  Vitals:   10/04/23 2018 10/04/23 2331 10/05/23 0411 10/05/23 0807  BP: 103/80 90/70 108/79 102/70  Pulse: 98 71 92 (!) 105  Resp: 20 20 20 16   Temp: 98.1 F (36.7 C) 98.7 F (37.1 C) 98.8 F (37.1 C) 98.2 F (36.8 C)  TempSrc: Oral Oral Oral Oral  SpO2: 98% 93% 93% 99%  Weight:      Height:        Intake/Output Summary (Last 24 hours) at 10/05/2023 0923 Last data filed at 10/05/2023 0618 Gross per 24 hour  Intake 340 ml  Output 1120 ml  Net -780 ml      10/03/2023    6:00 AM 10/02/2023    6:22 AM 10/01/2023    6:00 AM  Last 3 Weights  Weight (lbs) 107 lb 12.9 oz 109 lb 107 lb 4.8 oz  Weight (kg) 48.9 kg 49.442 kg 48.671 kg      Telemetry Accelerated junctional ~HR 95-99 - Personally Reviewed  ECG  Accelerated junctional  VR 98, p wave appearing after QRS seen in V1 and V2- Personally Reviewed  Physical Exam GEN: No  acute distress.   Cardiac: RRR, no murmurs, rubs, or gallops.  Respiratory: Diminished bilaterally MS: No edema; No deformity. Neuro:  Nonfocal  Psych: Normal affect   Labs High Sensitivity Troponin:  No results for input(s): TROPONINIHS in the last 720 hours.   Chemistry Recent Labs  Lab 09/28/23 1015 09/29/23 0749 09/30/23 0454 09/30/23 1700 10/01/23 0425 10/03/23 0423 10/04/23 0314 10/05/23 0308  NA 139   < > 138 137   < > 139 137 137  K 4.7   < > 3.9 4.2   < > 3.7 4.5 4.5  CL 103   < > 110 102   < > 101 103 101  CO2 26   < > 22 24   < > 28 26 27   GLUCOSE 84   < > 103* 141*   < > 121* 142* 107*  BUN 5*   < > 10 11   < > 17 22 20   CREATININE 0.69   < > 0.57* 0.56*   < > 0.63 0.54* 0.59*  CALCIUM  9.5   < > 7.6* 9.1   < > 8.7* 9.1 9.2  MG  --    < > 2.3 1.9  --   --   --  1.8  PROT 7.7  --   --   --   --   --   --   --   ALBUMIN  3.9  --   --   --   --   --   --   --   AST 37  --   --   --   --   --   --   --   ALT 21  --   --   --   --   --   --   --   ALKPHOS 87  --   --   --   --   --   --   --   BILITOT 1.1  --   --   --   --   --   --   --   GFRNONAA >60   < > >60 >60   < > >60 >60 >60  ANIONGAP 10   < > 6 11   < > 10 8 9    < > = values in this interval not displayed.    Lipids No results for input(s): CHOL, TRIG, HDL, LABVLDL, LDLCALC, CHOLHDL in the last 168 hours.  Hematology Recent Labs  Lab 10/02/23 1120 10/04/23 0314 10/05/23 0308  WBC 12.8* 8.5 7.2  RBC 2.85* 2.90* 2.86*  HGB 9.1* 9.4* 9.1*  HCT 27.5* 27.6* 27.5*  MCV 96.5 95.2 96.2  MCH 31.9 32.4 31.8  MCHC 33.1 34.1 33.1  RDW 13.5 13.3 13.3  PLT 104* 191 248   Thyroid  Recent Labs  Lab 10/05/23 0308  TSH 1.876    BNPNo results for input(s): BNP, PROBNP in the last 168 hours.  DDimer No results for input(s): DDIMER in the last 168 hours.   Radiology  DG Chest 2 View Result Date: 10/04/2023 CLINICAL DATA:  [id number] S/P MVR (mitral valve replacement) [id number] EXAM: CHEST - 2  VIEW COMPARISON:  10/01/2023. FINDINGS: Interval removal of right IJ sheath. Stable cardiomediastinal contours. Mitral and tricuspid valve repairs. Intact median sternotomy. Minimal right basilar atelectasis. No focal consolidation, pleural effusion, or pneumothorax. No acute osseous abnormality. IMPRESSION: Minimal right basilar atelectasis. Otherwise, no acute cardiopulmonary findings. Electronically Signed   By: Harrietta Sherry M.D.   On: 10/04/2023 08:22   ECHOCARDIOGRAM COMPLETE Result Date: 10/03/2023    ECHOCARDIOGRAM REPORT   Patient Name:   Christopher Farrell Date of Exam: 10/03/2023 Medical Rec #:  [id number]          Height:       64.0 in Accession #:    [id number]         Weight:       107.8 lb Date of Birth:  06-12-1960          BSA:          1.504 m Patient Age:    62 years           BP:           105/69 mmHg Patient Gender: M                  HR:           65 bpm. Exam Location:  Inpatient Procedure: 2D Echo, Color Doppler and Cardiac Doppler (Both Spectral and Color            Flow Doppler were utilized during procedure). Indications:     CHF-Acute Systolic I50.21  History:  Patient has prior history of Echocardiogram examinations, most                  recent 09/29/2023.  Sonographer:     Tinnie Gosling RDCS Referring Phys:  [id number] EZRA GORMAN SHUCK Diagnosing Phys: Stanly Leavens MD IMPRESSIONS  1. Small neo-LVOT with flow acceleration without evidence of Spectral Doppler gradient. Left ventricular ejection fraction, by estimation, is 50 to 55%. The left ventricle has low normal function. The left ventricle has no regional wall motion abnormalities. There is mild concentric left ventricular hypertrophy. Left ventricular diastolic parameters are indeterminate.  2. Right ventricular systolic function is moderately reduced. The right ventricular size is moderately enlarged.  3. PHT < 130 ms. TVI ratio greater than 2.2 but without visual evidence of pathologic regurgitation; 31 mm Medtronic  Mosaic porcine valve. The mitral valve has been repaired/replaced. No evidence of mitral valve regurgitation. The mean mitral valve gradient is 3.8 mmHg with average heart rate of 63 bpm.  4. The aortic valve was not well visualized. Aortic valve regurgitation is not visualized. No aortic stenosis is present.  5. The inferior vena cava is dilated in size with <50% respiratory variability, suggesting right atrial pressure of 15 mmHg. Comparison(s): Prior images reviewed side by side. Mitral valve has been been replaced with improved tricuspid regurgitation as well; post surgery the right ventricle is less vigorous. FINDINGS  Left Ventricle: Small neo-LVOT with flow acceleration without evidence of Spectral Doppler gradient. Left ventricular ejection fraction, by estimation, is 50 to 55%. The left ventricle has low normal function. The left ventricle has no regional wall motion abnormalities. The left ventricular internal cavity size was normal in size. There is mild concentric left ventricular hypertrophy. Left ventricular diastolic parameters are indeterminate. Right Ventricle: The right ventricular size is moderately enlarged. No increase in right ventricular wall thickness. Right ventricular systolic function is moderately reduced. Left Atrium: Left atrial size was normal in size. Right Atrium: Right atrial size was normal in size. Pericardium: There is no evidence of pericardial effusion. Mitral Valve: PHT < 130 ms. TVI ratio greater than 2.2 but without visual evidence of pathologic regurgitation; 31 mm Medtronic Mosaic porcine valve. The mitral valve has been repaired/replaced. No evidence of mitral valve regurgitation. There is a 31 mm  present in the mitral position. The mean mitral valve gradient is 3.8 mmHg with average heart rate of 63 bpm. Tricuspid Valve: The tricuspid valve is normal in structure. Tricuspid valve regurgitation is not demonstrated. No evidence of tricuspid stenosis. Aortic Valve: The  aortic valve was not well visualized. Aortic valve regurgitation is not visualized. No aortic stenosis is present. Pulmonic Valve: The pulmonic valve was not well visualized. Pulmonic valve regurgitation is not visualized. No evidence of pulmonic stenosis. Aorta: The aortic root and ascending aorta are structurally normal, with no evidence of dilitation. Venous: The inferior vena cava is dilated in size with less than 50% respiratory variability, suggesting right atrial pressure of 15 mmHg. IAS/Shunts: The atrial septum is grossly normal.  LEFT VENTRICLE PLAX 2D LVIDd:         3.80 cm   Diastology LVIDs:         3.00 cm   LV e' medial:    4.90 cm/s LV PW:         1.20 cm   LV E/e' medial:  25.3 LV IVS:        1.20 cm   LV e' lateral:   5.22 cm/s LVOT diam:  1.70 cm   LV E/e' lateral: 23.8 LV SV:         32 LV SV Index:   21 LVOT Area:     2.27 cm  IVC IVC diam: 2.00 cm LEFT ATRIUM           Index LA diam:      3.70 cm 2.46 cm/m LA Vol (A4C): 38.7 ml 25.72 ml/m  AORTIC VALVE LVOT Vmax:   90.30 cm/s LVOT Vmean:  65.000 cm/s LVOT VTI:    0.139 m  AORTA Ao Root diam: 2.60 cm Ao Asc diam:  3.20 cm MITRAL VALVE MV Area (PHT): 2.14 cm     SHUNTS MV Mean grad:  3.8 mmHg     Systemic VTI:  0.14 m MV Decel Time: 354 msec     Systemic Diam: 1.70 cm MV E velocity: 124.00 cm/s MV A velocity: 62.30 cm/s MV E/A ratio:  1.99 Stanly Leavens MD Electronically signed by Stanly Leavens MD Signature Date/Time: 10/03/2023/3:19:39 PM    Final (Updated)     Patient Profile   63 y.o. 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 Lucas   Assessment & Plan  Severe mitral regurgitation s/p MV  replacement Moderate tricuspid regurgitation s/p TV repair MVR on 09/29/23 with a 31 mm Medtronic Mosaic porcine valve Tricuspid valve repair with 30 mm Edwards MC3 annuloplasty ring  LAA clipping Post echocardiogram shows TVI ratio > 2.2 without visual evidence of mitral regurgitation, mean mitral valve gradient 3.8 mmHg. No tricuspid regurgitation. CT surgery following, anticipate dc tomorrow   Continue on ASA 325mg  CT surgery plan to start eliquis  at discharge   Hx Hypotension Prior to admission patient was on midodrine  5 mg TID BP 102/70  Patient asymptomatic & maintaining a blood pressure ~ 100/70 He is mainly sitting in chair/laying in bed Would hold off on restarting his midodrine    Pulmonary Hypertension RV failure Elevated systemic PA pressures noted intraoperatively [ PASP ~80 mmHg] Suspected to be Type II 2/2 severe MR Post echocardiogram shows LVEF 50-55% with no RWMA. Moderately enlarged RV with reduced systolic function.  CXR this am showed minimal right basilar atelectasis, no evidence of vascular congestion. Currently net - 6.6 L this admission On exam patient does not appear volume up.    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 Agree with above.  -Holding on diuresis. -Continuing with spironolactone  12.5 mg given reduced EF -Pacing wires were removed.  Zio patch outpatient -BPs in the low 100s, compatible with outpatient blood pressures which were even in the 90s at times.  I am fine with holding off on midodrine  5 mg 3 times daily.  Oneil Parchment, MD

## 2023-10-05 NOTE — TOC Initial Note (Addendum)
 Transition of Care [facility]) - Initial/Assessment Note    Patient Details  Name: Christopher Farrell MRN: [id number] Date of Birth: [birth year]  [facility] CM/SW Contact:    Justina Delcia Czar, RN Phone Number: [phone] 10/05/2023, 9:21 AM  Clinical Narrative:                 HF IP CM spoke to pt and mother at bedside. States he was independent pta. Denies HH/DME at home. Lives at home with his mother. Discussed HH needs at dc, will need PT/OT evaluation and recommendations.   Will continue to follow for dc needs.   Expected Discharge Plan: Home/Self Care Barriers to Discharge: Continued Medical Work up   Patient Goals and CMS Choice Patient states their goals for this hospitalization and ongoing recovery are:: wants to recover CMS Medicare.gov Compare Post Acute Care list provided to:: Patient   [place] ownership interest in [facility].provided to:: Patient    Expected Discharge Plan and Services   Discharge Planning Services: CM Consult Post Acute Care Choice: Home Health                                        Prior Living Arrangements/Services   Lives with:: Self Patient language and need for interpreter reviewed:: Yes Do you feel safe going back to the place where you live?: Yes      Need for Family Participation in Patient Care: No (Comment) Care giver support system in place?: Yes (comment)   Criminal Activity/Legal Involvement Pertinent to Current Situation/Hospitalization: No - Comment as needed  Activities of Daily Living      Permission Sought/Granted Permission sought to share information with : Case Manager, Family Supports, PCP Permission granted to share information with : Yes, Verbal Permission Granted  Share Information with NAME: Reason Helzer  Permission granted to share info w AGENCY: Home Health, PCP, DME  Permission granted to share info w Relationship: mother  Permission granted to share info w Contact  Information: [phone]  Emotional Assessment Appearance:: Appears stated age Attitude/Demeanor/Rapport: Engaged Affect (typically observed): Accepting Orientation: : Oriented to Self, Oriented to Place, Oriented to  Time, Oriented to Situation   Psych Involvement: No (comment)  Admission diagnosis:  Severe mitral regurgitation [I34.0] Severe tricuspid regurgitation [I07.1] S/P MVR (mitral valve replacement) [Z95.2] Patient Active Problem List   Diagnosis Date Noted   S/P MVR (mitral valve replacement) 09/29/2023   S/P TVR (tricuspid valve repair) 09/29/2023   S/P left atrial appendage ligation 09/29/2023   Impacted cerumen of both ears 05/30/2023   CVA (cerebral vascular accident) (HCC) 01/30/2023   SOB (shortness of breath) 01/30/2023   Chronic diastolic CHF (congestive heart failure) (HCC) 01/30/2023   Acute cerebral ischemia 10/26/2022   Tobacco use disorder 10/26/2022   Hospital discharge follow-up 10/26/2022   HIV disease (HCC) 10/16/2022   Need for assessment by dentistry for poor dentition 10/10/2022   Severe mitral regurgitation 10/10/2022   History of endocarditis 10/10/2022   Pulmonary hypertension (HCC) 10/10/2022   Anemia, chronic disease 10/01/2022   Hypotension 09/23/2022   Acute bacterial endocarditis 09/09/2022   Meningitis due to Streptococcus pneumoniae 09/09/2022   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 [place]  [address] Phone: [phone] Fax: [phone]  [facility] [phone] @ [address] [place], [place] - [address] [place] [address] Phone: [phone] Fax: [phone]     Social Drivers of Health (SDOH) Social History: SDOH Screenings   Food Insecurity: No Food Insecurity (09/13/2022)  Housing: Patient Declined (09/13/2022)  Transportation Needs: No Transportation Needs (09/13/2022)  Utilities: Not At Risk (09/13/2022)  Depression (PHQ2-9): Low Risk  (02/01/2023)  Tobacco Use: High Risk (09/28/2023)   SDOH Interventions:     Readmission Risk Interventions    10/06/2022   11:54 AM  Readmission Risk Prevention Plan  Transportation Screening Complete  PCP or Specialist Appt within 5-7 Days Complete  Home Care Screening Complete  Medication Review (RN CM) Complete

## 2023-10-05 NOTE — Progress Notes (Signed)
 Epicardial pacing wires removed per MD order without difficulty.  Patient educated on one hour bedrest and frequent BP checks.

## 2023-10-06 ENCOUNTER — Inpatient Hospital Stay (HOSPITAL_COMMUNITY): Admit: 2023-10-06 | Discharge: 2023-10-06 | Disposition: A | Discharge status: Home / Self Care

## 2023-10-06 ENCOUNTER — Other Ambulatory Visit (HOSPITAL_COMMUNITY): Payer: Self-pay

## 2023-10-06 DIAGNOSIS — I459 Conduction disorder, unspecified: Secondary | ICD-10-CM

## 2023-10-06 LAB — CBC WITH DIFFERENTIAL/PLATELET
Abs Immature Granulocytes: 0.13 K/uL — ABNORMAL HIGH (ref 0.00–0.07)
Basophils Absolute: 0.1 K/uL (ref 0.0–0.1)
Basophils Relative: 1 %
Eosinophils Absolute: 0.2 K/uL (ref 0.0–0.5)
Eosinophils Relative: 3 %
HCT: 27.9 % — ABNORMAL LOW (ref 39.0–52.0)
Hemoglobin: 9.4 g/dL — ABNORMAL LOW (ref 13.0–17.0)
Immature Granulocytes: 2 %
Lymphocytes Relative: 24 %
Lymphs Abs: 2.2 K/uL (ref 0.7–4.0)
MCH: 32.5 pg (ref 26.0–34.0)
MCHC: 33.7 g/dL (ref 30.0–36.0)
MCV: 96.5 fL (ref 80.0–100.0)
Monocytes Absolute: 1.1 K/uL — ABNORMAL HIGH (ref 0.1–1.0)
Monocytes Relative: 13 %
Neutro Abs: 5.2 K/uL (ref 1.7–7.7)
Neutrophils Relative %: 57 %
Platelets: 309 K/uL (ref 150–400)
RBC: 2.89 MIL/uL — ABNORMAL LOW (ref 4.22–5.81)
RDW: 13.4 % (ref 11.5–15.5)
WBC: 8.9 K/uL (ref 4.0–10.5)
nRBC: 0 % (ref 0.0–0.2)

## 2023-10-06 LAB — BASIC METABOLIC PANEL WITH GFR
Anion gap: 10 (ref 5–15)
BUN: 21 mg/dL (ref 8–23)
CO2: 27 mmol/L (ref 22–32)
Calcium: 9.2 mg/dL (ref 8.9–10.3)
Chloride: 101 mmol/L (ref 98–111)
Creatinine, Ser: 0.55 mg/dL — ABNORMAL LOW (ref 0.61–1.24)
GFR, Estimated: >60 mL/min (ref >60)
Glucose, Bld: 116 mg/dL — ABNORMAL HIGH (ref 70–99)
Potassium: 4.1 mmol/L (ref 3.5–5.1)
Sodium: 138 mmol/L (ref 135–145)

## 2023-10-06 LAB — GLUCOSE, CAPILLARY
Glucose-Capillary: 102 mg/dL — ABNORMAL HIGH (ref 70–99)
Glucose-Capillary: 111 mg/dL — ABNORMAL HIGH (ref 70–99)

## 2023-10-06 LAB — MAGNESIUM: Magnesium: 1.8 mg/dL (ref 1.7–2.4)

## 2023-10-06 MED ORDER — APIXABAN 5 MG PO TABS
5.0000 mg | ORAL_TABLET | Freq: Two times a day (BID) | ORAL | Status: DC
  Administered 2023-10-06: 5 mg via ORAL
  Filled 2023-10-06: qty 1

## 2023-10-06 MED ORDER — TRAMADOL HCL 50 MG PO TABS
50.0000 mg | ORAL_TABLET | Freq: Four times a day (QID) | ORAL | 0 refills | Status: DC | PRN
  Filled 2023-10-06: qty 20, 5d supply, fill #0

## 2023-10-06 MED ORDER — ASPIRIN 81 MG PO TBEC
81.0000 mg | DELAYED_RELEASE_TABLET | Freq: Every day | ORAL | Status: DC
  Administered 2023-10-06: 81 mg via ORAL
  Filled 2023-10-06: qty 1

## 2023-10-06 MED ORDER — ASPIRIN 81 MG PO TBEC
81.0000 mg | DELAYED_RELEASE_TABLET | Freq: Every day | ORAL | 12 refills | Status: DC
  Filled 2023-10-06: qty 30, 30d supply, fill #0

## 2023-10-06 MED ORDER — MAGNESIUM SULFATE 2 GM/50ML IV SOLN
2.0000 g | Freq: Once | INTRAVENOUS | Status: AC
  Administered 2023-10-06: 2 g via INTRAVENOUS
  Filled 2023-10-06: qty 50

## 2023-10-06 MED ORDER — APIXABAN 5 MG PO TABS
5.0000 mg | ORAL_TABLET | Freq: Two times a day (BID) | ORAL | 3 refills | Status: DC
  Filled 2023-10-06: qty 60, 30d supply, fill #0

## 2023-10-06 MED ORDER — MAGNESIUM SULFATE 2 GM/50ML IV SOLN: 2.0000 g | Freq: Once | INTRAVENOUS | Status: DC

## 2023-10-06 MED ORDER — SPIRONOLACTONE 25 MG PO TABS
12.5000 mg | ORAL_TABLET | Freq: Every day | ORAL | 3 refills | Status: DC
  Filled 2023-10-06: qty 30, 60d supply, fill #0

## 2023-10-06 NOTE — Progress Notes (Signed)
 RN to complete discharge due to need to complete education with caregiver because of patient has issues understanding.(cognitive impairment) per RN

## 2023-10-06 NOTE — Plan of Care (Signed)
 Problem: Education: Goal: Knowledge of General Education information will improve Description: Including pain rating scale, medication(s)/side effects and non-pharmacologic comfort measures 10/06/2023 1532 by Gary Michaelis, RN Outcome: Adequate for Discharge 10/06/2023 1105 by Gary Michaelis, RN Outcome: Progressing   Problem: Health Behavior/Discharge Planning: Goal: Ability to manage health-related needs will improve 10/06/2023 1532 by Gary Michaelis, RN Outcome: Adequate for Discharge 10/06/2023 1105 by Gary Michaelis, RN Outcome: Progressing   Problem: Clinical Measurements: Goal: Ability to maintain clinical measurements within normal limits will improve 10/06/2023 1532 by Gary Michaelis, RN Outcome: Adequate for Discharge 10/06/2023 1105 by Gary Michaelis, RN Outcome: Progressing Goal: Will remain free from infection 10/06/2023 1532 by Gary Michaelis, RN Outcome: Adequate for Discharge 10/06/2023 1105 by Gary Michaelis, RN Outcome: Progressing Goal: Diagnostic test results will improve 10/06/2023 1532 by Gary Michaelis, RN Outcome: Adequate for Discharge 10/06/2023 1105 by Gary Michaelis, RN Outcome: Progressing Goal: Respiratory complications will improve 10/06/2023 1532 by Gary Michaelis, RN Outcome: Adequate for Discharge 10/06/2023 1105 by Gary Michaelis, RN Outcome: Progressing Goal: Cardiovascular complication will be avoided 10/06/2023 1532 by Gary Michaelis, RN Outcome: Adequate for Discharge 10/06/2023 1105 by Gary Michaelis, RN Outcome: Progressing   Problem: Activity: Goal: Risk for activity intolerance will decrease 10/06/2023 1532 by Gary Michaelis, RN Outcome: Adequate for Discharge 10/06/2023 1105 by Gary Michaelis, RN Outcome: Progressing   Problem: Nutrition: Goal: Adequate nutrition will be maintained 10/06/2023 1532 by Gary Michaelis, RN Outcome: Adequate for Discharge 10/06/2023 1105 by Gary Michaelis, RN Outcome:  Progressing   Problem: Coping: Goal: Level of anxiety will decrease 10/06/2023 1532 by Gary Michaelis, RN Outcome: Adequate for Discharge 10/06/2023 1105 by Gary Michaelis, RN Outcome: Progressing   Problem: Elimination: Goal: Will not experience complications related to bowel motility 10/06/2023 1532 by Gary Michaelis, RN Outcome: Adequate for Discharge 10/06/2023 1105 by Gary Michaelis, RN Outcome: Progressing Goal: Will not experience complications related to urinary retention 10/06/2023 1532 by Gary Michaelis, RN Outcome: Adequate for Discharge 10/06/2023 1105 by Gary Michaelis, RN Outcome: Progressing   Problem: Pain Managment: Goal: General experience of comfort will improve and/or be controlled 10/06/2023 1532 by Gary Michaelis, RN Outcome: Adequate for Discharge 10/06/2023 1105 by Gary Michaelis, RN Outcome: Progressing   Problem: Safety: Goal: Ability to remain free from injury will improve 10/06/2023 1532 by Gary Michaelis, RN Outcome: Adequate for Discharge 10/06/2023 1105 by Gary Michaelis, RN Outcome: Progressing   Problem: Skin Integrity: Goal: Risk for impaired skin integrity will decrease 10/06/2023 1532 by Gary Michaelis, RN Outcome: Adequate for Discharge 10/06/2023 1105 by Gary Michaelis, RN Outcome: Progressing   Problem: Education: Goal: Will demonstrate proper wound care and an understanding of methods to prevent future damage 10/06/2023 1532 by Gary Michaelis, RN Outcome: Adequate for Discharge 10/06/2023 1105 by Gary Michaelis, RN Outcome: Progressing Goal: Knowledge of disease or condition will improve 10/06/2023 1532 by Gary Michaelis, RN Outcome: Adequate for Discharge 10/06/2023 1105 by Gary Michaelis, RN Outcome: Progressing Goal: Knowledge of the prescribed therapeutic regimen will improve 10/06/2023 1532 by Gary Michaelis, RN Outcome: Adequate for Discharge 10/06/2023 1105 by Gary Michaelis, RN Outcome:  Progressing Goal: Individualized Educational Video(s) 10/06/2023 1532 by Gary Michaelis, RN Outcome: Adequate for Discharge 10/06/2023 1105 by Gary Michaelis, RN Outcome: Progressing   Problem: Activity: Goal: Risk for activity intolerance will decrease 10/06/2023 1532 by Gary Michaelis, RN Outcome: Adequate for Discharge 10/06/2023 1105 by Gary Michaelis, RN Outcome: Progressing   Problem: Cardiac: Goal: Will achieve and/or maintain hemodynamic stability 10/06/2023 1532 by Gary Michaelis, RN Outcome: Adequate  for Discharge 10/06/2023 1105 by Gary Michaelis, RN Outcome: Progressing   Problem: Clinical Measurements: Goal: Postoperative complications will be avoided or minimized 10/06/2023 1532 by Gary Michaelis, RN Outcome: Adequate for Discharge 10/06/2023 1105 by Gary Michaelis, RN Outcome: Progressing   Problem: Respiratory: Goal: Respiratory status will improve 10/06/2023 1532 by Gary Michaelis, RN Outcome: Adequate for Discharge 10/06/2023 1105 by Gary Michaelis, RN Outcome: Progressing   Problem: Skin Integrity: Goal: Wound healing without signs and symptoms of infection 10/06/2023 1532 by Gary Michaelis, RN Outcome: Adequate for Discharge 10/06/2023 1105 by Gary Michaelis, RN Outcome: Progressing Goal: Risk for impaired skin integrity will decrease 10/06/2023 1532 by Gary Michaelis, RN Outcome: Adequate for Discharge 10/06/2023 1105 by Gary Michaelis, RN Outcome: Progressing   Problem: Urinary Elimination: Goal: Ability to achieve and maintain adequate renal perfusion and functioning will improve 10/06/2023 1532 by Gary Michaelis, RN Outcome: Adequate for Discharge 10/06/2023 1105 by Gary Michaelis, RN Outcome: Progressing   Problem: Education: Goal: Ability to describe self-care measures that may prevent or decrease complications (Diabetes Survival Skills Education) will improve 10/06/2023 1532 by Gary Michaelis, RN Outcome: Adequate  for Discharge 10/06/2023 1105 by Gary Michaelis, RN Outcome: Progressing Goal: Individualized Educational Video(s) 10/06/2023 1532 by Gary Michaelis, RN Outcome: Adequate for Discharge 10/06/2023 1105 by Gary Michaelis, RN Outcome: Progressing   Problem: Coping: Goal: Ability to adjust to condition or change in health will improve 10/06/2023 1532 by Gary Michaelis, RN Outcome: Adequate for Discharge 10/06/2023 1105 by Gary Michaelis, RN Outcome: Progressing   Problem: Fluid Volume: Goal: Ability to maintain a balanced intake and output will improve 10/06/2023 1532 by Gary Michaelis, RN Outcome: Adequate for Discharge 10/06/2023 1105 by Gary Michaelis, RN Outcome: Progressing   Problem: Health Behavior/Discharge Planning: Goal: Ability to identify and utilize available resources and services will improve 10/06/2023 1532 by Gary Michaelis, RN Outcome: Adequate for Discharge 10/06/2023 1105 by Gary Michaelis, RN Outcome: Progressing Goal: Ability to manage health-related needs will improve 10/06/2023 1532 by Gary Michaelis, RN Outcome: Adequate for Discharge 10/06/2023 1105 by Gary Michaelis, RN Outcome: Progressing   Problem: Metabolic: Goal: Ability to maintain appropriate glucose levels will improve 10/06/2023 1532 by Gary Michaelis, RN Outcome: Adequate for Discharge 10/06/2023 1105 by Gary Michaelis, RN Outcome: Progressing   Problem: Nutritional: Goal: Maintenance of adequate nutrition will improve 10/06/2023 1532 by Gary Michaelis, RN Outcome: Adequate for Discharge 10/06/2023 1105 by Gary Michaelis, RN Outcome: Progressing Goal: Progress toward achieving an optimal weight will improve 10/06/2023 1532 by Gary Michaelis, RN Outcome: Adequate for Discharge 10/06/2023 1105 by Gary Michaelis, RN Outcome: Progressing   Problem: Skin Integrity: Goal: Risk for impaired skin integrity will decrease 10/06/2023 1532 by Gary Michaelis, RN Outcome:  Adequate for Discharge 10/06/2023 1105 by Gary Michaelis, RN Outcome: Progressing   Problem: Tissue Perfusion: Goal: Adequacy of tissue perfusion will improve 10/06/2023 1532 by Gary Michaelis, RN Outcome: Adequate for Discharge 10/06/2023 1105 by Gary Michaelis, RN Outcome: Progressing

## 2023-10-06 NOTE — Progress Notes (Addendum)
      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  He is ready to go home.  Objective: Vital signs in last 24 hours: Temp:  [98.2 F (36.8 C)-98.5 F (36.9 C)] 98.2 F (36.8 C) (08/01 0300) Pulse Rate:  [94-106] 102 (08/01 0307) Cardiac Rhythm: Heart block (07/31 0745) Resp:  [16-20] 18 (08/01 0307) BP: (88-107)/(66-84) 107/69 (08/01 0307) SpO2:  [97 %-100 %] 97 % (08/01 0307) Weight:  [47.8 kg] 47.8 kg (08/01 0307)  Intake/Output from previous day: 07/31 0701 - 08/01 0700 In: 770 [P.O.:720; IV Piggyback:50] Out: 400 [Urine:400]  General appearance: alert, cooperative, and no distress Heart: regular rate and rhythm Lungs: clear to auscultation bilaterally Abdomen: soft, non-tender; bowel sounds normal; no masses,  no organomegaly Extremities: extremities normal, atraumatic, no cyanosis or edema Wound: clean and dry  Lab Results: Recent Labs    10/05/23 0308 10/06/23 0334  WBC 7.2 8.9  HGB 9.1* 9.4*  HCT 27.5* 27.9*  PLT 248 309   BMET:  Recent Labs    10/05/23 0308 10/06/23 0334  NA 137 138  K 4.5 4.1  CL 101 101  CO2 27 27  GLUCOSE 107* 116*  BUN 20 21  CREATININE 0.59* 0.55*  CALCIUM  9.2 9.2    PT/INR: No results for input(s): LABPROT, INR in the last 72 hours. 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 continue to hold off on BB.. will place ZIO patch per EP recommendations.. start Eliquis  Pulm- no acute issues, continue IS ID- OR cultures remain negative Patient is doing well, in ST.. will d/c home today   LOS: 7 days    Rocky Shad, PA-C 10/06/2023   Chart reviewed, patient examined, agree with above.  He is in NSR this am. Will plan to send home.

## 2023-10-06 NOTE — Plan of Care (Signed)
  Problem: Education: Goal: Knowledge of General Education information will improve Description: Including pain rating scale, medication(s)/side effects and non-pharmacologic comfort measures Outcome: Progressing   Problem: Health Behavior/Discharge Planning: Goal: Ability to manage health-related needs will improve Outcome: Progressing   Problem: Clinical Measurements: Goal: Ability to maintain clinical measurements within normal limits will improve Outcome: Progressing Goal: Will remain free from infection Outcome: Progressing Goal: Diagnostic test results will improve Outcome: Progressing Goal: Respiratory complications will improve Outcome: Progressing Goal: Cardiovascular complication will be avoided Outcome: Progressing   Problem: Activity: Goal: Risk for activity intolerance will decrease Outcome: Progressing   Problem: Nutrition: Goal: Adequate nutrition will be maintained Outcome: Progressing   Problem: Coping: Goal: Level of anxiety will decrease Outcome: Progressing   Problem: Elimination: Goal: Will not experience complications related to bowel motility Outcome: Progressing Goal: Will not experience complications related to urinary retention Outcome: Progressing   Problem: Pain Managment: Goal: General experience of comfort will improve and/or be controlled Outcome: Progressing   Problem: Safety: Goal: Ability to remain free from injury will improve Outcome: Progressing   Problem: Skin Integrity: Goal: Risk for impaired skin integrity will decrease Outcome: Progressing   Problem: Education: Goal: Will demonstrate proper wound care and an understanding of methods to prevent future damage Outcome: Progressing Goal: Knowledge of disease or condition will improve Outcome: Progressing Goal: Knowledge of the prescribed therapeutic regimen will improve Outcome: Progressing Goal: Individualized Educational Video(s) Outcome: Progressing   Problem:  Activity: Goal: Risk for activity intolerance will decrease Outcome: Progressing   Problem: Cardiac: Goal: Will achieve and/or maintain hemodynamic stability Outcome: Progressing   Problem: Clinical Measurements: Goal: Postoperative complications will be avoided or minimized Outcome: Progressing   Problem: Respiratory: Goal: Respiratory status will improve Outcome: Progressing   Problem: Skin Integrity: Goal: Wound healing without signs and symptoms of infection Outcome: Progressing Goal: Risk for impaired skin integrity will decrease Outcome: Progressing   Problem: Urinary Elimination: Goal: Ability to achieve and maintain adequate renal perfusion and functioning will improve Outcome: Progressing   Problem: Education: Goal: Ability to describe self-care measures that may prevent or decrease complications (Diabetes Survival Skills Education) will improve Outcome: Progressing Goal: Individualized Educational Video(s) Outcome: Progressing   Problem: Coping: Goal: Ability to adjust to condition or change in health will improve Outcome: Progressing   Problem: Fluid Volume: Goal: Ability to maintain a balanced intake and output will improve Outcome: Progressing   Problem: Health Behavior/Discharge Planning: Goal: Ability to identify and utilize available resources and services will improve Outcome: Progressing Goal: Ability to manage health-related needs will improve Outcome: Progressing   Problem: Metabolic: Goal: Ability to maintain appropriate glucose levels will improve Outcome: Progressing   Problem: Nutritional: Goal: Maintenance of adequate nutrition will improve Outcome: Progressing Goal: Progress toward achieving an optimal weight will improve Outcome: Progressing   Problem: Skin Integrity: Goal: Risk for impaired skin integrity will decrease Outcome: Progressing   Problem: Tissue Perfusion: Goal: Adequacy of tissue perfusion will improve Outcome:  Progressing

## 2023-10-06 NOTE — Progress Notes (Signed)
 Attempted to wait on mother for education however ultimately discussed with pt only. Discussed IS, sternal precautions, exercise, and CRPII. Pt voiced reception. Will refer to [place] CRPII.  [phone] Aliene Aris BS, ACSM-CEP 10/06/2023 2:11 PM

## 2023-10-06 NOTE — Progress Notes (Signed)
 Patient discharged home via wheelchair/private auto, d/c education completed with patient and his  mom. Voiced understanding.

## 2023-10-06 NOTE — Progress Notes (Signed)
   Rounding Note    Patient Name: Christopher Farrell Date of Encounter: 10/06/2023  [facility] Cardiologist: Annabella Scarce, MD   Subjective   NAEON  Pacing wires removed. He has no complaints this AM  Vital Signs    Vitals:   10/05/23 2259 10/06/23 0300 10/06/23 0307 10/06/23 0817  BP: 94/70 (!) 88/69 107/69 105/73  Pulse: (!) 101 100 (!) 102 94  Resp: 20 17 18  (!) 24  Temp: 98.5 F (36.9 C) 98.2 F (36.8 C)  98.1 F (36.7 C)  TempSrc: Oral Oral  Oral  SpO2: 100% 100% 97% 99%  Weight:   47.8 kg   Height:        Intake/Output Summary (Last 24 hours) at 10/06/2023 0929 Last data filed at 10/06/2023 0555 Gross per 24 hour  Intake 770 ml  Output 400 ml  Net 370 ml      10/06/2023    3:07 AM 10/03/2023    6:00 AM 10/02/2023    6:22 AM  Last 3 Weights  Weight (lbs) 105 lb 6.1 oz 107 lb 12.9 oz 109 lb  Weight (kg) 47.8 kg 48.9 kg 49.442 kg      Telemetry    SR with long 1st deg HB, p-waves appear to march throughout, rates 90-100  Personally Reviewed  ECG    No new  Physical Exam   GEN: No acute distress.   Cardiac: RRR, no murmurs, rubs, or gallops; sternal incision healing well Respiratory: Clear to auscultation bilaterally. Psych: Normal affect   Assessment & Plan    #s/p MVr/TVr #post op conduction system disease Conduction system appears to have recovered. Tele with SR with in 90-100s.  Discussed with CTS PA, discharging today with 2 week live zio to monitor Outpatient follow-up with EP team is being scheduled   Michiah Mudry, NP Electrophysiology 10/06/23 9:33 AM

## 2023-10-06 NOTE — TOC Transition Note (Signed)
 Transition of Care (TOC) - Discharge Note Rayfield Gobble RN, BSN Inpatient Care Management Unit 4E- RN Case Manager See Treatment Team for direct phone #   Patient Details  Name: Christopher Farrell MRN: [id number] Date of Birth: 1960-09-04  Transition [facility] Care [facility]) CM/SW Contact:  Gobble Rayfield Hurst, RN Phone Number: 10/06/2023, 1:36 PM   Clinical Narrative:    Pt stable for transition home today, HH has been set up w/ [facility] per TCTS office referral. CM has confirmed with liaison that they will follow with [facility].  Liaison notified for start of care.   Family to transport home  Cards to place Zio patch prior to discharge.   No further CM needs noted.    Final next level of care: Home w Home Health Services Barriers to Discharge: Barriers Resolved   Patient Goals and CMS Choice Patient states their goals for this hospitalization and ongoing recovery are:: wants to recover CMS Medicare.gov Compare Post Acute Care list provided to:: Patient   Brielle ownership interest in [facility].provided to:: Patient    Discharge Placement               Home w/ [facility]        Discharge Plan and Services Additional resources added to the After Visit Summary for     Discharge Planning Services: CM Consult Post Acute Care Choice: Home Health          DME Arranged: N/A DME Agency: NA       HH Arranged: RN, Disease Management HH Agency: [facility] Date HH Agency Contacted: 10/05/23   Representative spoke with at [facility] Agency: [place]  Social Drivers of Health (SDOH) Interventions SDOH Screenings   Food Insecurity: No Food Insecurity (09/13/2022)  Housing: Patient Declined (09/13/2022)  Transportation Needs: No Transportation Needs (09/13/2022)  Utilities: Not At Risk (09/13/2022)  Depression (PHQ2-9): Low Risk  (02/01/2023)  Tobacco Use: High Risk (09/28/2023)     Readmission Risk Interventions    10/06/2023    1:36 PM 10/06/2022   11:54 AM   Readmission Risk Prevention Plan  Post Dischage Appt Complete   Medication Screening Complete   Transportation Screening Complete Complete  PCP or Specialist Appt within 5-7 Days  Complete  Home Care Screening  Complete  Medication Review (RN CM)  Complete

## 2023-10-06 NOTE — Discharge Summary (Signed)
 [address] [place] [address]             [phone]        Physician Discharge Summary  Patient ID: Christopher Farrell MRN: [id number] DOB/AGE: Aug 31, 1960 62 y.o.  Admit date: 09/29/2023 Discharge date: 10/06/2023  Admission Diagnoses:  Patient Active Problem List   Diagnosis Date Noted   Impacted cerumen of both ears 05/30/2023   CVA (cerebral vascular accident) (HCC) 01/30/2023   SOB (shortness of breath) 01/30/2023   Chronic diastolic CHF (congestive heart failure) (HCC) 01/30/2023   Acute cerebral ischemia 10/26/2022   Tobacco use disorder 10/26/2022   Hospital discharge follow-up 10/26/2022   HIV disease (HCC) 10/16/2022   Need for assessment by dentistry for poor dentition 10/10/2022   Severe mitral regurgitation 10/10/2022   History of endocarditis 10/10/2022   Pulmonary hypertension (HCC) 10/10/2022   Anemia, chronic disease 10/01/2022   Hypotension 09/23/2022   Acute bacterial endocarditis 09/09/2022   Meningitis due to Streptococcus pneumoniae 09/09/2022   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 pylori infection 10/23/2020   Herpes simplex virus (HSV) infection 08/25/2008   Discharge Diagnoses:  Patient Active Problem List   Diagnosis Date Noted   S/P MVR (mitral valve replacement) 09/29/2023   S/P TVR (tricuspid valve repair) 09/29/2023   S/P left atrial appendage ligation 09/29/2023   Impacted cerumen of both ears 05/30/2023   CVA (cerebral vascular accident) (HCC) 01/30/2023   SOB (shortness of breath) 01/30/2023   Chronic diastolic CHF (congestive heart failure) (HCC) 01/30/2023   Acute cerebral ischemia 10/26/2022   Tobacco use disorder 10/26/2022   Hospital discharge follow-up 10/26/2022   HIV disease (HCC) 10/16/2022   Need for assessment  by dentistry for poor dentition 10/10/2022   Severe mitral regurgitation 10/10/2022   History of endocarditis 10/10/2022   Pulmonary hypertension (HCC) 10/10/2022   Anemia, chronic disease 10/01/2022   Hypotension 09/23/2022   Acute bacterial endocarditis 09/09/2022   Meningitis due to Streptococcus pneumoniae 09/09/2022   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  TEE showed a large mitral valve vegetation with severe mitral regurgitation.  He was seen by Dr. Maryjane at that time and plans were made for outpatient follow-up after completing his antibiotics.  He apparently no showed several times.  He had an echocardiogram on 01/19/2023 showing severe mitral regurgitation due to prolapse/flail of the anterior mitral leaflet.  There is also moderate to severe tricuspid valve regurgitation with hepatic vein flow reversal.  Right ventricular systolic function was at least moderately reduced with moderate RV enlargement.  PA systolic pressure was estimated at 68 mmHg.  Left ventricular ejection fraction was 60 to 65%.  His most recent echo on 05/16/2023 showed a thickened and shaggy anterior mitral valve leaflet  with prolapse of the anterior leaflet and severe mitral regurgitation.  There was also severe tricuspid regurgitation.  Right ventricular systolic function was moderately reduced.  Left ventricular ejection fraction was estimated at 58%.  There was  grade 3 diastolic dysfunction.  There was no aortic valve disease.   He recently had his remaining maxillary teeth extracted by Dr. Celena.  He reports continued exertional shortness of breath and fatigue as well as some orthopnea.  He has had lower extremity edema.  He has not been eating very well and lost some weight due to his extractions.  Dr. Lucas reviewed the patient's diagnostic studies and determined he would benefit from surgical intervention. He reviewed the treatment options as well as the risks and benefits of surgery with the patient. Christopher Farrell was agreeable to proceed with surgery.  Hospital Course: Christopher Farrell presented to the [facility] on 09/29/23 and underwent Mitral Valve Replacement with a 31 mm Mosaic Bioprosthetic Valve and Tricuspid Valve Repair with a 30 mm MC3 Ring Annuloplasty.  He tolerated the procedure well and was transferred to the SICU in stable condition. He was weaned off Milrinone  and Epinephrine  drips. Norva Purl, a line and foley were removed. He was above his pre op weight prior to surgery and was diuresed accordingly. Advanced heart failure was consulted for pulmonary hypertension. He appeared to have junctional rhythm. He was not put on a beta blocker. Per Dr. Lucas, Coumadin will not be used as patient likely will not be reliable but he will be on ec asa. He was transitioned off the Insulin  drip. His pre op HGA1C was 5.7. He likely has pre diabetes.  He continued to be in CHB with variable rates. He was felt medically stable for transfer to the progressive care unit on 10/03/2023. The patient's heart rhythm  improved and he converted to NSR.    EP did not feel PPM would be indicated at this time.  They did recommend ZIO patch at discharge.  The patient's incisions are healing without evidence of infection.  He is stable for discharge home today.  Consults: cardiology  Significant Diagnostic Studies: cardiac graphics:   Echocardiogram:     IMPRESSIONS    1. Left ventricular ejection fraction, by estimation, is 55 to 60%. Left  ventricular ejection fraction by 3D volume is 58 %. The left ventricle has  normal function. The left ventricle has no regional wall motion  abnormalities. Left ventricular diastolic   parameters are consistent with Grade III diastolic dysfunction  (restrictive). There is the interventricular septum is flattened in  systole, consistent with right ventricular pressure overload. The average  left ventricular global longitudinal strain is  -11.9 %. The global longitudinal strain is abnormal.   2. Right ventricular systolic function is normal. The right ventricular  size is enlarged. There is severely elevated pulmonary artery systolic  pressure. The estimated right ventricular systolic pressure is 71.2 mmHg.   3. Left atrial size was moderately dilated.   4. Native mitral valve, likely degenerative, no mitral stenosis, severe  mitral regurgitation likely due to flail anterior leaflet (best seen in  image 57), A2/A3 scallop, eccentric jet directed posteriorly wraps the  left atrial wall with systolic flow  reversal in the pulmonary vein. PISA radius 1.4cm, ERO 0.72cm2, MR  regurgitant volume 93cc.   5. Tricuspid valve regurgitation is moderate to severe.   6. The aortic valve is tricuspid. Aortic valve regurgitation is not  visualized. No aortic stenosis is present.  7. The inferior vena cava is dilated in size with <50% respiratory  variability, suggesting right atrial pressure of 15 mmHg.   8. Cannot exclude a small PFO.   Treatments: surgery:   CARDIOVASCULAR SURGERY OPERATIVE NOTE   09/29/23   Surgeon:  Dorise LOIS Fellers, MD   First Assistant: Kyla Donald, PA-C: An experienced assistant was required given the complexity of this surgery and the standard of surgical care. The assistant was needed for exposure, dissection, suctioning, retraction of delicate tissues and sutures,  instrument exchange and for overall help during this procedure.      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   Postoperative Diagnosis:  Same     Procedure:    Median Sternotomy Extracorporeal circulation Mitral valve replacement using a 31 mm Medtronic Mosaic porcine valve.   4.    Tricuspid valve repair using a 30 mm Edwards MC3 annuloplasty ring.   5.    Clipping of left atrial appendage.   Discharge Exam: Blood pressure 107/69, pulse (!) 102, temperature 98.2 F (36.8 C), temperature source Oral, resp. rate 18, height 5' 4 (1.626 m), weight 47.8 kg, SpO2 97%.  General appearance: alert, cooperative, and no distress Heart: regular rate and rhythm Lungs: clear to auscultation bilaterally Abdomen: soft, non-tender; bowel sounds normal; no masses,  no organomegaly Extremities: extremities normal, atraumatic, no cyanosis or edema Wound: clean and dry   Discharge Medications:  The patient has been discharged on:   1.Beta Blocker:  Yes [   ]                              No   [ X  ]                              If No, reason: post op CHB  2.Ace Inhibitor/ARB: Yes [   ]                                     No  [  X  ]                                     If No, reason: labile BP  3.Statin:   Yes [   ]                  No  [ x  ]                  If No, reason: no HLD  4.Ecasa:  Yes  [  X ]                  No   [   ]                  If No, reason:  Patient had ACS upon admission: No   Plavix /P2Y12 inhibitor: Yes [   ]                                      No  [ X  ]  Allergies  as of 10/06/2023   No Known Allergies      Medication List     STOP taking these medications    furosemide  40 MG tablet Commonly known as: Lasix    midodrine  5 MG tablet Commonly known as: PROAMATINE    potassium chloride  SA 20 MEQ tablet Commonly known as: KLOR-CON  M       TAKE these  medications    acetaminophen  500 MG tablet Commonly known as: TYLENOL  Take 500 mg by mouth every 6 (six) hours as needed for moderate pain.   albuterol  108 (90 Base) MCG/ACT inhaler Commonly known as: VENTOLIN  HFA Inhale 2 puffs into the lungs every 6 (six) hours as needed for wheezing or shortness of breath.   apixaban  5 MG Tabs tablet Commonly known as: ELIQUIS  Take 1 tablet (5 mg total) by mouth 2 (two) times daily.   aspirin  EC 81 MG tablet Take 1 tablet (81 mg total) by mouth daily. Swallow whole.   bictegravir-emtricitabine -tenofovir  AF 50-200-25 MG Tabs tablet Commonly known as: BIKTARVY  Take 1 tablet by mouth daily.   escitalopram  20 MG tablet Commonly known as: LEXAPRO  TAKE 1 TABLET(20 MG) BY MOUTH DAILY   spironolactone  25 MG tablet Commonly known as: ALDACTONE  Take 0.5 tablets (12.5 mg total) by mouth daily.   traMADol  50 MG tablet Commonly known as: ULTRAM  Take 1 tablet (50 mg total) by mouth every 6 (six) hours as needed for moderate pain (pain score 4-6).        Follow-up Information     [facility] Follow up.   Why: HHRN to follow under [facility] office protocol- they will contact you to schedule Contact information: [address] [place], [place] [address] Phone: [phone]                Signed:  Rocky Shad, PA-C  10/06/2023, 8:09 AM

## 2023-10-07 DIAGNOSIS — Z48812 Encounter for surgical aftercare following surgery on the circulatory system: Secondary | ICD-10-CM | POA: Diagnosis not present

## 2023-10-09 ENCOUNTER — Ambulatory Visit: Payer: Self-pay | Attending: Surgery | Admitting: *Deleted

## 2023-10-09 DIAGNOSIS — Z4802 Encounter for removal of sutures: Secondary | ICD-10-CM

## 2023-10-10 MED FILL — Sodium Chloride IV Soln 0.9%: INTRAVENOUS | Qty: 1000 | Status: AC

## 2023-10-10 MED FILL — Electrolyte-R (PH 7.4) Solution: INTRAVENOUS | Qty: 3000 | Status: AC

## 2023-10-10 MED FILL — Heparin Sodium (Porcine) Inj 1000 Unit/ML: INTRAMUSCULAR | Qty: 20 | Status: AC

## 2023-10-10 MED FILL — Mannitol IV Soln 20%: INTRAVENOUS | Qty: 500 | Status: AC

## 2023-10-10 MED FILL — Sodium Bicarbonate IV Soln 8.4%: INTRAVENOUS | Qty: 50 | Status: AC

## 2023-10-12 LAB — GLUCOSE, CAPILLARY
Glucose-Capillary: 112 mg/dL — ABNORMAL HIGH (ref 70–99)
Glucose-Capillary: 113 mg/dL — ABNORMAL HIGH (ref 70–99)

## 2023-10-25 ENCOUNTER — Other Ambulatory Visit: Payer: Self-pay | Admitting: Surgery

## 2023-10-25 DIAGNOSIS — I340 Nonrheumatic mitral (valve) insufficiency: Secondary | ICD-10-CM

## 2023-10-30 ENCOUNTER — Other Ambulatory Visit: Payer: Self-pay | Admitting: Surgery

## 2023-10-30 ENCOUNTER — Ambulatory Visit: Payer: Self-pay

## 2023-10-30 ENCOUNTER — Ambulatory Visit (HOSPITAL_COMMUNITY)
Admission: RE | Admit: 2023-10-30 | Discharge: 2023-10-30 | Disposition: A | Discharge status: Home / Self Care | Source: Ambulatory Visit | Attending: Cardiology | Admitting: Cardiology

## 2023-10-30 VITALS — BP 107/74 | HR 95 | Resp 20 | Wt 124.0 lb

## 2023-10-30 DIAGNOSIS — Z9889 Other specified postprocedural states: Secondary | ICD-10-CM

## 2023-10-30 DIAGNOSIS — Z952 Presence of prosthetic heart valve: Secondary | ICD-10-CM

## 2023-10-30 DIAGNOSIS — I340 Nonrheumatic mitral (valve) insufficiency: Secondary | ICD-10-CM | POA: Insufficient documentation

## 2023-10-30 NOTE — Progress Notes (Signed)
 [address] [name] [address]             [phone]       HPI: [name] is a 63 year old male with medical history of bacterial endocarditis, cerebral septic emboli, severe mitral regurgitation, pulmonary hypertension, CVA, meningitis due to Streptococcus pneumoniae, HIV, anxiety, depression and tobacco use disorder who  returns for routine postoperative follow-up having undergone mitral valve replacement using a 31 mm Medtronic Mosaic porcine valve, Tricuspid valve repair using a 30 mm Edwards MC3 annuloplasty ring and Clipping of left atrial appendage on 09/29/2023 with Dr. Lucas.  The patient's early postoperative recovery while in the hospital was notable for routine diuresis due to being above his preoperative weight.  Heart failure was consulted due to pulmonary hypertension.  He had junctional rhythm on EKG but was not started on Coumadin due to the possibility of him being unreliable with follow-up.  He was started on aspirin .  His heart rhythm improved and he converted to normal sinus rhythm and electrophysiology did not feel that a permanent pacemaker would be indicated at this time.  He was discharged with a Zio patch for continued monitoring of rhythm.  He was discharged home on 10/06/2023 in stable condition . Since hospital discharge the patient reports has been doing well.  He presents the clinic with his wife.  He wore his Zio patch for 2 weeks and was sent back with cardiology.  He has an appointment with them on 11/15/2023.  He did not have to to report any episodes of feeling palpitations, abnormal rhythm, or symptoms with the Zio patch.  He has been attempting to walk for exercise.  He does still have some fatigue.  He has been using Tylenol  for pain and is no longer taking narcotics.  His incisions have been healing well.  He denies chest pain, shortness of breath, lower leg swelling.    Current Outpatient Medications  Medication  Sig Dispense Refill   acetaminophen  (TYLENOL ) 500 MG tablet Take 500 mg by mouth every 6 (six) hours as needed for moderate pain.     albuterol  (VENTOLIN  HFA) 108 (90 Base) MCG/ACT inhaler Inhale 2 puffs into the lungs every 6 (six) hours as needed for wheezing or shortness of breath. 8 g 2   apixaban  (ELIQUIS ) 5 MG TABS tablet Take 1 tablet (5 mg total) by mouth 2 (two) times daily. 60 tablet 3   aspirin  EC 81 MG tablet Take 1 tablet (81 mg total) by mouth daily. Swallow whole. 30 tablet 12   bictegravir-emtricitabine -tenofovir  AF (BIKTARVY ) 50-200-25 MG TABS tablet Take 1 tablet by mouth daily. 30 tablet 11   escitalopram  (LEXAPRO ) 20 MG tablet TAKE 1 TABLET(20 MG) BY MOUTH DAILY 90 tablet 1   spironolactone  (ALDACTONE ) 25 MG tablet Take 0.5 tablets (12.5 mg total) by mouth daily. 30 tablet 3   traMADol  (ULTRAM ) 50 MG tablet Take 1 tablet (50 mg total) by mouth every 6 (six) hours as needed for moderate pain (pain score 4-6). 30 tablet 0   No current facility-administered medications for this visit.   Vitals:   10/30/23 0930  BP: 107/74  Pulse: 95  Resp: 20  SpO2: 97%   Review of Systems  Constitutional:  Positive for malaise/fatigue.  Respiratory: Negative.  Negative for cough and shortness of breath.   Cardiovascular: Negative.  Negative for chest pain, palpitations and leg swelling.   Physical Exam Constitutional:  Appearance: Normal appearance.  HENT:     Head: Normocephalic and atraumatic.  Cardiovascular:     Rate and Rhythm: Normal rate and regular rhythm.     Heart sounds: Normal heart sounds, S1 normal and S2 normal.  Pulmonary:     Effort: Pulmonary effort is normal.     Breath sounds: Normal breath sounds.  Skin:    General: Skin is warm and dry.      Neurological:     General: No focal deficit present.     Mental Status: He is alert and oriented to person, place, and time.      Diagnostic Tests: CLINICAL DATA:  Mitral valve repair   EXAM: CHEST - 2  VIEW   COMPARISON:  10/04/2023   FINDINGS: Sternotomy wires normal cardiac silhouette. Atrial clip noted. Mitral valve repair noted.   Normal cardiac silhouette. Normal pulmonary vasculature. No evidence of effusion, infiltrate, or pneumothorax. No acute bony abnormality.   IMPRESSION: No acute cardiopulmonary findings.     Electronically Signed   By: Jackquline Boxer M.D.   On: 10/30/2023 10:45   Plan: S/P MVR (mitral valve replacement), S/P TVR (tricuspid valve repair), S/P left atrial appendage ligation   We reviewed today's chest x ray. We discussed driving and he is able to do so at this time but he reports that he was not driving prior to surgery.  We discussed participation in cardiac rehab and he is hesitant at this time.  Will have them reach out with information so we can think it over before declining.  From a surgical standpoint he is cleared to participate if he wishes.  Discussed that he should continue sternal precautions for a full 6 weeks from surgery.  He can slowly increase his activity and exercise as tolerated.  Discussed that he would need prophylactic antibiotics for future dental procedures or minor surgeries.  He had dental procedure before heart surgery and is now being fitted for dentures.  He has not been seen by cardiology yet and appointment is scheduled for 11/15/2023.  He did send Zio patch back as directed.  Discussed that if he started to have symptoms of palpitations, racing heart, chest pain he would need to be evaluated.   [name], PA-C [facility] [phone]

## 2023-10-30 NOTE — Patient Instructions (Signed)
 You may continue to gradually increase your physical activity as tolerated.  Refrain from any heavy lifting or strenuous use of your arms and shoulders until at least 8 weeks from the time of your surgery, and avoid activities that cause increased pain in your chest on the side of your surgical incision.  Otherwise you may continue to increase activities without any particular limitations.  Increase the intensity and duration of physical activity gradually.  Endocarditis is a potentially serious infection of heart valves or inside lining of the heart.  It occurs more commonly in patients with diseased heart valves (such as patient's with aortic or mitral valve disease) and in patients who have undergone heart valve repair or replacement.  Certain surgical and dental procedures may put you at risk, such as dental cleaning, other dental procedures, or any surgery involving the respiratory, urinary, gastrointestinal tract, gallbladder or prostate gland.   To minimize your chances for develooping endocarditis, maintain good oral health and seek prompt medical attention for any infections involving the mouth, teeth, gums, skin or urinary tract.    Always notify your doctor or dentist about your underlying heart valve condition before having any invasive procedures. You will need to take antibiotics before certain procedures, including all routine dental cleanings or other dental procedures.  Your cardiologist or dentist should prescribe these antibiotics for you to be taken ahead of time.

## 2023-11-07 ENCOUNTER — Ambulatory Visit: Admitting: Nurse Practitioner

## 2023-11-14 NOTE — Progress Notes (Unsigned)
 Cardiology Office Note    Date:  11/14/2023  ID:  Christopher Farrell, DOB Jul 26, 1960, MRN [id number] PCP:  Paseda, Folashade R, FNP  Cardiologist:  Annabella Scarce, MD  Electrophysiologist:  OLE ONEIDA HOLTS, MD   Chief Complaint: ***  History of Present Illness: Christopher Farrell is a 63 y.o. 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 This is my first time meeting him for a visit.  Complex PMH as noted. Had prolonged admission 7-10/2022 for severe sepsis secondary to multifocal pneumonia and bacterial meningitis due to streptococcus pneumoniae after presenting with fever and cough. Had not been taking his antiretrovirals for HIV. Had seizure while in the ED. Brain MRI with multifocal acute/subacute ischemia in bilateral cerebral hemispheres (R>L) in L cerebellar. He was found to have mitral valve endocarditis with associated severe MR as well as significant tricuspid regurgitation and RV dysfunction. EF was normal. Imaging also showed dental abscess. He was treated with IV abx for several weeks. Hospitalization complicated by hypotension treated with midodrine . He also required Lasix  for volume overload as well. At 04/2023 with [facility], he had missed his follow-up with CT surgery, and had also not yet seen dentist so was referred back for both. He eventually had dental surgery and then saw CT surgery in 07/2023 who scheduled additional testing before proceeding with surgery. Repeat echo 08/2023 showed EF 58%, G3DD, flattened IVS suggestive of RV pressure overload, enlarged RV, severely elevated PASP, severe MR, moderate-severe TR, dilated IVC, cannot exclude small  PFO. He underwent bioprosthetic MVR and TV repair with clipping of LAA on 09/29/23. The AHF team followed him after surgery for his pulmonary HTN/RV failure which was felt driven by severe MR. He required epi, digoxin , milrinone  post-operatively. Hospitalization was also complicated by conduction system disease requiring EP involvement (documented to have had CHB, accelerated junctional, intermittent wenckebach, and first degree AVB). Heart failure note 10/03/23 indicates development of atrial flutter and EP APP note 7/31 noted atrial flutter. Therefore patient discharged on Eliquis . 2 week Zio was placed ***. Post-op CXR 10/30/23 was stable.  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    Please see the history of present illness. Otherwise, review of systems is positive for ***.  All other systems are reviewed and otherwise negative.  Studies Reviewed: SABRA    EKG:  EKG is ordered today, personally reviewed, demonstrating ***  CV Studies: Cardiac studies reviewed are outlined and summarized above. Otherwise please see EMR for full report.   Current Reported Medications:.    No outpatient medications have been marked as taking for the 11/15/23 encounter (Appointment) with Gorden Stthomas N, PA-C.    Physical Exam:    VS:  There were no vitals taken for this visit.   Wt Readings from Last 3 Encounters:  10/30/23 124 lb (56.2 kg)  10/06/23 105 lb 6.1 oz (47.8 kg)  09/28/23 105 lb  1.6 oz (47.7 kg)    GEN: Well nourished, well developed in no acute distress NECK: No JVD; No carotid bruits CARDIAC: ***RRR, no murmurs, rubs,  gallops RESPIRATORY:  Clear to auscultation without rales, wheezing or rhonchi  ABDOMEN: Soft, non-tender, non-distended EXTREMITIES:  No edema; No acute deformity   Asessement and Plan:.     ***  {The patient has an active order for outpatient cardiac rehabilitation.   Please indicate if the patient is ready to start. Do NOT delete this.  It will auto delete.  Refresh note, then sign.              Click here to document readiness and see contraindications.  :1}  Cardiac Rehabilitation Eligibility Assessment      Disposition: F/u with ***  Signed, Asaiah Scarber N Rejeana Fadness, PA-C

## 2023-11-15 ENCOUNTER — Ambulatory Visit: Attending: Cardiovascular Disease | Admitting: Physician Assistant

## 2023-11-15 ENCOUNTER — Encounter: Payer: Self-pay | Admitting: Physician Assistant

## 2023-11-15 VITALS — BP 120/70 | HR 101 | Resp 16 | Ht 64.0 in | Wt 131.0 lb

## 2023-11-15 DIAGNOSIS — I2510 Atherosclerotic heart disease of native coronary artery without angina pectoris: Secondary | ICD-10-CM | POA: Insufficient documentation

## 2023-11-15 DIAGNOSIS — I059 Rheumatic mitral valve disease, unspecified: Secondary | ICD-10-CM | POA: Diagnosis present

## 2023-11-15 DIAGNOSIS — I50810 Right heart failure, unspecified: Secondary | ICD-10-CM | POA: Insufficient documentation

## 2023-11-15 DIAGNOSIS — Z9889 Other specified postprocedural states: Secondary | ICD-10-CM | POA: Diagnosis present

## 2023-11-15 DIAGNOSIS — I480 Paroxysmal atrial fibrillation: Secondary | ICD-10-CM | POA: Insufficient documentation

## 2023-11-15 DIAGNOSIS — I4892 Unspecified atrial flutter: Secondary | ICD-10-CM | POA: Insufficient documentation

## 2023-11-15 DIAGNOSIS — I349 Nonrheumatic mitral valve disorder, unspecified: Secondary | ICD-10-CM | POA: Diagnosis not present

## 2023-11-15 DIAGNOSIS — I442 Atrioventricular block, complete: Secondary | ICD-10-CM | POA: Insufficient documentation

## 2023-11-15 DIAGNOSIS — I272 Pulmonary hypertension, unspecified: Secondary | ICD-10-CM | POA: Insufficient documentation

## 2023-11-15 DIAGNOSIS — Z952 Presence of prosthetic heart valve: Secondary | ICD-10-CM | POA: Insufficient documentation

## 2023-11-15 MED ORDER — AMOXICILLIN 500 MG PO TABS: ORAL_TABLET | ORAL | 3 refills | Status: DC

## 2023-11-15 NOTE — Progress Notes (Unsigned)
  Electrophysiology Office Follow up Visit Note:    Date:  11/16/2023   ID:  Christopher Farrell, DOB [birth year], MRN [id number]  PCP:  Paseda, Folashade R, FNP  [facility] Cardiologist:  Annabella Scarce, MD  [facility] Electrophysiologist:  OLE ONEIDA HOLTS, MD    Interval History:     Christopher Farrell is a 63 y.o. male who presents for a follow up visit.   I saw the patient when he was hospitalized after mitral valve repair.  During the hospitalization he had heart block early after surgery but this improved.  He was discharged with a 2-week ZIO monitor.  Per an office note with Lonell Bring, PA-C there was no evidence of significant AV block on the heart monitor. He presents today for follow-up and to discuss the results of his heart monitor.  He is doing well.  No syncope or presyncope.  His strength is coming back quickly.  He is walking regularly for exercise.      Past medical, surgical, social and family history were reviewed.  ROS:   Please see the history of present illness.    All other systems reviewed and are negative.  EKGs/Labs/Other Studies Reviewed:    The following studies were reviewed today:   ZIO monitor personally reviewed No evidence of high degree AV block No prolonged pauses 1 nonsustained VT episode lasting 16 beats Rare ectopy Intermittent first-degree AV delay  EKG Interpretation Date/Time:  Thursday November 16 2023 09:53:12 EDT Ventricular Rate:  99 PR Interval:  154 QRS Duration:  68 QT Interval:  366 QTC Calculation: 469 R Axis:   8  Text Interpretation: Normal sinus rhythm Normal ECG Confirmed by HOLTS OLE [phone]) on 11/16/2023 9:55:36 AM    Physical Exam:    VS:  BP 120/70 (BP Location: Left Arm, Patient Position: Sitting, Cuff Size: Normal)   Pulse 97   Ht 5' 4 (1.626 m)   Wt 130 lb 11.2 oz (59.3 kg)   SpO2 97%   BMI 22.43 kg/m     Wt Readings from Last 3 Encounters:  11/16/23 130 lb 11.2 oz (59.3 kg)   11/15/23 131 lb (59.4 kg)  10/30/23 124 lb (56.2 kg)     GEN: no distress CARD: RRR, No MRG RESP: No IWOB. CTAB.      ASSESSMENT:    1. Endocarditis of mitral valve   2. S/P MVR (mitral valve replacement)   3. Complete heart block (HCC)   4. PAF (paroxysmal atrial fibrillation) (HCC)   5. Paroxysmal atrial flutter (HCC)    PLAN:    In order of problems listed above:  #Postop heart block #Post mitral valve replacement and tricuspid valve repair Now resolved.  Recent heart monitor showed no evidence of advanced heart block.  He is asymptomatic.  I do not think there is any indication for permanent pacing at this time.  #Perioperative atrial fibrillation/flutter Is on Eliquis  but suspect this will be needed long-term given mitral valve replacement.  I will send a note to the surgical team regarding duration of anticoagulation.  Follow-up with EP APP in 6 months.  Signed, OLE HOLTS, MD, [facility], [facility] 11/16/2023 9:55 AM    Electrophysiology [facility]

## 2023-11-15 NOTE — Patient Instructions (Signed)
 Medication Instructions:  Your physician recommends that you continue on your current medications as directed. Please refer to the Current Medication list given to you today.  *If you need a refill on your cardiac medications before your next appointment, please call your pharmacy*  Lab Work: TODAY- BMET, MG2, CBC If you have labs (blood work) drawn today and your tests are completely normal, you will receive your results only by: MyChart Message (if you have MyChart) OR A paper copy in the mail If you have any lab test that is abnormal or we need to change your treatment, we will call you to review the results.   Follow-Up: At [facility], you and your health needs are our priority.  As part of our continuing mission to provide you with exceptional heart care, our providers are all part of one team.  This team includes your primary Cardiologist (physician) and Advanced Practice Providers or APPs (Physician Assistants and Nurse Practitioners) who all work together to provide you with the care you need, when you need it.  Your next appointment:   2 month(s)  Provider:   Annabella Scarce, MD or Reche Finder, NP    We recommend signing up for the patient portal called MyChart.  Sign up information is provided on this After Visit Summary.  MyChart is used to connect with patients for Virtual Visits (Telemedicine).  Patients are able to view lab/test results, encounter notes, upcoming appointments, etc.  Non-urgent messages can be sent to your provider as well.   To learn more about what you can do with MyChart, go to [email or web address].   Other Instructions Keep upcoming appointments

## 2023-11-16 ENCOUNTER — Encounter: Payer: Self-pay | Admitting: Cardiology

## 2023-11-16 ENCOUNTER — Ambulatory Visit: Payer: Self-pay | Admitting: Physician Assistant

## 2023-11-16 ENCOUNTER — Ambulatory Visit: Attending: Cardiology | Admitting: Cardiology

## 2023-11-16 VITALS — BP 120/70 | HR 97 | Ht 64.0 in | Wt 130.7 lb

## 2023-11-16 DIAGNOSIS — I442 Atrioventricular block, complete: Secondary | ICD-10-CM | POA: Insufficient documentation

## 2023-11-16 DIAGNOSIS — I480 Paroxysmal atrial fibrillation: Secondary | ICD-10-CM | POA: Insufficient documentation

## 2023-11-16 DIAGNOSIS — I349 Nonrheumatic mitral valve disorder, unspecified: Secondary | ICD-10-CM

## 2023-11-16 DIAGNOSIS — I059 Rheumatic mitral valve disease, unspecified: Secondary | ICD-10-CM | POA: Insufficient documentation

## 2023-11-16 DIAGNOSIS — Z952 Presence of prosthetic heart valve: Secondary | ICD-10-CM | POA: Diagnosis present

## 2023-11-16 DIAGNOSIS — E875 Hyperkalemia: Secondary | ICD-10-CM

## 2023-11-16 DIAGNOSIS — I4892 Unspecified atrial flutter: Secondary | ICD-10-CM | POA: Diagnosis present

## 2023-11-16 LAB — CBC
Hematocrit: 33.5 % — ABNORMAL LOW (ref 37.5–51.0)
Hemoglobin: 11 g/dL — ABNORMAL LOW (ref 13.0–17.7)
MCH: 32 pg (ref 26.6–33.0)
MCHC: 32.8 g/dL (ref 31.5–35.7)
MCV: 97 fL (ref 79–97)
Platelets: 263 x10E3/uL (ref 150–450)
RBC: 3.44 x10E6/uL — ABNORMAL LOW (ref 4.14–5.80)
RDW: 12.8 % (ref 11.6–15.4)
WBC: 8.5 x10E3/uL (ref 3.4–10.8)

## 2023-11-16 LAB — BASIC METABOLIC PANEL WITH GFR
BUN/Creatinine Ratio: 22 (ref 10–24)
BUN: 19 mg/dL (ref 8–27)
CO2: 22 mmol/L (ref 20–29)
Calcium: 10.3 mg/dL — ABNORMAL HIGH (ref 8.6–10.2)
Chloride: 100 mmol/L (ref 96–106)
Creatinine, Ser: 0.87 mg/dL (ref 0.76–1.27)
Glucose: 116 mg/dL — ABNORMAL HIGH (ref 70–99)
Potassium: 4.8 mmol/L (ref 3.5–5.2)
Sodium: 140 mmol/L (ref 134–144)
eGFR: 98 mL/min/1.73 (ref >59)

## 2023-11-16 LAB — MAGNESIUM: Magnesium: 2.1 mg/dL (ref 1.6–2.3)

## 2023-11-16 NOTE — Patient Instructions (Addendum)
 Medication Instructions:  Your physician recommends that you continue on your current medications as directed. Please refer to the Current Medication list given to you today.  *If you need a refill on your cardiac medications before your next appointment, please call your pharmacy*  Follow-Up: At [facility], you and your health needs are our priority.  As part of our continuing mission to provide you with exceptional heart care, our providers are all part of one team.  This team includes your primary Cardiologist (physician) and Advanced Practice Providers or APPs (Physician Assistants and Nurse Practitioners) who all work together to provide you with the care you need, when you need it.  Your next appointment:   6 months with EP APP

## 2023-11-20 ENCOUNTER — Telehealth (HOSPITAL_COMMUNITY): Payer: Self-pay

## 2023-11-20 NOTE — Telephone Encounter (Signed)
 Pt insurance is active and benefits verified through [facility] Co-pay $0, DED $0/$0 met, out of pocket $0/$0 met, co-insurance 0%. No pre-authorization required for 9 codes. 12/03/2023 @ 8:40am, spoke with Kathrine, REF# [id number].  TCR/ICR? TCR Visit(date of service)limitation? 36 Can multiple codes be used on the same date of service/visit?(IF ITS A LIMIT) No  Is this a lifetime maximum or an annual maximum? Annual Has the member used any of these services to date? No Is there a time limit (weeks/months) on start of program and/or program completion? 6 months from 7/25

## 2023-11-20 NOTE — Telephone Encounter (Signed)
 Called patient regarding cardiac rehab, patient states he walks daily and is not interested at this time. Informed him he can call us  back to reopen the referral if he changes his mind.  Closing referral.

## 2023-12-05 ENCOUNTER — Other Ambulatory Visit: Payer: Self-pay | Admitting: Nurse Practitioner

## 2023-12-05 ENCOUNTER — Ambulatory Visit (INDEPENDENT_AMBULATORY_CARE_PROVIDER_SITE_OTHER): Payer: Self-pay | Admitting: Nurse Practitioner

## 2023-12-05 ENCOUNTER — Encounter: Payer: Self-pay | Admitting: Nurse Practitioner

## 2023-12-05 VITALS — BP 97/76 | HR 104 | Wt 137.0 lb

## 2023-12-05 DIAGNOSIS — H6122 Impacted cerumen, left ear: Secondary | ICD-10-CM | POA: Diagnosis not present

## 2023-12-05 DIAGNOSIS — Z0000 Encounter for general adult medical examination without abnormal findings: Secondary | ICD-10-CM | POA: Insufficient documentation

## 2023-12-05 DIAGNOSIS — J432 Centrilobular emphysema: Secondary | ICD-10-CM

## 2023-12-05 DIAGNOSIS — F17200 Nicotine dependence, unspecified, uncomplicated: Secondary | ICD-10-CM

## 2023-12-05 DIAGNOSIS — Z13220 Encounter for screening for lipoid disorders: Secondary | ICD-10-CM | POA: Insufficient documentation

## 2023-12-05 DIAGNOSIS — B20 Human immunodeficiency virus [HIV] disease: Secondary | ICD-10-CM

## 2023-12-05 DIAGNOSIS — D649 Anemia, unspecified: Secondary | ICD-10-CM | POA: Insufficient documentation

## 2023-12-05 DIAGNOSIS — Z125 Encounter for screening for malignant neoplasm of prostate: Secondary | ICD-10-CM

## 2023-12-05 DIAGNOSIS — Z23 Encounter for immunization: Secondary | ICD-10-CM

## 2023-12-05 DIAGNOSIS — Z1211 Encounter for screening for malignant neoplasm of colon: Secondary | ICD-10-CM

## 2023-12-05 DIAGNOSIS — Z0001 Encounter for general adult medical examination with abnormal findings: Secondary | ICD-10-CM | POA: Diagnosis not present

## 2023-12-05 DIAGNOSIS — I2510 Atherosclerotic heart disease of native coronary artery without angina pectoris: Secondary | ICD-10-CM

## 2023-12-05 DIAGNOSIS — Z952 Presence of prosthetic heart valve: Secondary | ICD-10-CM

## 2023-12-05 DIAGNOSIS — I4891 Unspecified atrial fibrillation: Secondary | ICD-10-CM

## 2023-12-05 DIAGNOSIS — E559 Vitamin D deficiency, unspecified: Secondary | ICD-10-CM | POA: Insufficient documentation

## 2023-12-05 MED ORDER — DEBROX 6.5 % OT SOLN: 5.0000 [drp] | Freq: Two times a day (BID) | OTIC | 0 refills | Status: DC

## 2023-12-05 NOTE — Assessment & Plan Note (Signed)
On aspirin 81mg daily.  
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
## 2023-12-05 NOTE — Assessment & Plan Note (Addendum)
  Annual physical exam completed. Intermittent chest pain with movement, no significant shortness of breath. Fatigue persists. - Encourage moderate exercise, 30 minutes, 5 days a week. - Recommend heart-healthy, low-salt, low-fat diet. - Ensure adequate hydration with at least 64 ounces of water  daily. - Recommend 7-8 hours of sleep nightly. - Administer flu shot today. - Order CBC, calcium , and lipid panel. - Encourage use of seatbelts when riding in a car. Due for colon cancer screening. Discussed screening options. - Refer for colonoscopy as he has previously undergone this procedure. - get Shingles vaccine at pharmacy. - Encourage staying up to date with vaccinations.

## 2023-12-05 NOTE — Assessment & Plan Note (Signed)
 Noted on CT Encouraged to continue to abstain from smoking cigarettes Continue albuterol  inhaler 2 puffs every 6 hours as needed

## 2023-12-05 NOTE — Assessment & Plan Note (Signed)
 HIV infection on antiretroviral therapy Managed with Biktarvy . Two full bottles of medication available. - Ensure continuation of Biktarvy . - Follow up with infectious disease specialist. - Provide contact information for infectious disease specialist.

## 2023-12-05 NOTE — Assessment & Plan Note (Signed)
 Advised to take Tylenol  500 mg every 6 hours as needed chest pain

## 2023-12-05 NOTE — Assessment & Plan Note (Signed)
 Advised use of Debrox as needed

## 2023-12-05 NOTE — Patient Instructions (Addendum)
 OK to use Debrox (peroxide) in the ear to loosen up wax. Do not use Q-tips as this can impact wax further.      1. Annual physical exam (Primary)   2. Vitamin D deficiency  - VITAMIN D 25 Hydroxy (Vit-D Deficiency, Fractures)  3. Need for influenza vaccination  - Flu vaccine trivalent PF, 6mos and older(Flulaval,Afluria,Fluarix,Fluzone)  4. Encounter for lipid screening for cardiovascular disease  - Lipid panel  5. Hypercalcemia  - Calcium , ionized  6. Anemia, unspecified type  - CBC  7. Impacted cerumen, left ear  - carbamide peroxide (DEBROX) 6.5 % OTIC solution; Place 5 drops into the left ear 2 (two) times daily.  Dispense: 15 mL; Refill: 0  8. Screening for colon cancer  - Ambulatory referral to Gastroenterology     It is important that you exercise regularly at least 30 minutes 5 times a week as tolerated  Think about what you will eat, plan ahead. Choose  clean, green, fresh or frozen over canned, processed or packaged foods which are more sugary, salty and fatty. 70 to 75% of food eaten should be vegetables and fruit. Three meals at set times with snacks allowed between meals, but they must be fruit or vegetables. Aim to eat over a 12 hour period , example 7 am to 7 pm, and STOP after  your last meal of the day. Drink water ,generally about 64 ounces per day, no other drink is as healthy. Fruit juice is best enjoyed in a healthy way, by EATING the fruit.  Thanks for choosing [facility] we consider it a privelige to serve you.

## 2023-12-05 NOTE — Assessment & Plan Note (Signed)
 Lab Results  Component Value Date   WBC 8.5 11/15/2023   HGB 11.0 (L) 11/15/2023   HCT 33.5 (L) 11/15/2023   MCV 97 11/15/2023   PLT 263 11/15/2023  Rechecking CBC

## 2023-12-05 NOTE — Assessment & Plan Note (Signed)
 Perioperative atrial fibrillation on anticoagulation Managed with Eliquis  5 mg twice daily. - Ensure continuation of Eliquis  with refills available at pharmacy.

## 2023-12-05 NOTE — Assessment & Plan Note (Signed)
 Last vitamin D Lab Results  Component Value Date   VD25OH 18 (L) 02/13/2020  Checking labs today

## 2023-12-05 NOTE — Assessment & Plan Note (Signed)
 Nicotine  dependence in remission Quit smoking in September brief relapse on birthday. - Encourage continued abstinence from smoking.

## 2023-12-05 NOTE — Assessment & Plan Note (Signed)
 Lab Results  Component Value Date   NA 140 11/15/2023   K 4.8 11/15/2023   CO2 22 11/15/2023   GLUCOSE 116 (H) 11/15/2023   BUN 19 11/15/2023   CREATININE 0.87 11/15/2023   CALCIUM  10.3 (H) 11/15/2023   EGFR 98 11/15/2023   GFRNONAA >60 10/06/2023  Checking ionized calcium 

## 2023-12-07 LAB — CBC
Hematocrit: 40.6 % (ref 37.5–51.0)
Hemoglobin: 13.1 g/dL (ref 13.0–17.7)
MCH: 32 pg (ref 26.6–33.0)
MCHC: 32.3 g/dL (ref 31.5–35.7)
MCV: 99 fL — ABNORMAL HIGH (ref 79–97)
Platelets: 272 x10E3/uL (ref 150–450)
RBC: 4.1 x10E6/uL — ABNORMAL LOW (ref 4.14–5.80)
RDW: 13.8 % (ref 11.6–15.4)
WBC: 10.9 x10E3/uL — ABNORMAL HIGH (ref 3.4–10.8)

## 2023-12-07 LAB — SPECIMEN STATUS REPORT

## 2023-12-07 LAB — LIPID PANEL
Chol/HDL Ratio: 1.7 ratio (ref 0.0–5.0)
Cholesterol, Total: 123 mg/dL (ref 100–199)
HDL: 73 mg/dL (ref >39)
LDL Chol Calc (NIH): 33 mg/dL (ref 0–99)
Triglycerides: 87 mg/dL (ref 0–149)
VLDL Cholesterol Cal: 17 mg/dL (ref 5–40)

## 2023-12-07 LAB — PSA: Prostate Specific Ag, Serum: 0.3 ng/mL (ref 0.0–4.0)

## 2023-12-07 LAB — CALCIUM, IONIZED: Calcium, Ion: 4.9 mg/dL (ref 4.5–5.6)

## 2023-12-07 LAB — VITAMIN D 25 HYDROXY (VIT D DEFICIENCY, FRACTURES): Vit D, 25-Hydroxy: 31.2 ng/mL (ref 30.0–100.0)

## 2023-12-08 ENCOUNTER — Ambulatory Visit: Payer: Self-pay | Admitting: Nurse Practitioner

## 2024-01-01 ENCOUNTER — Ambulatory Visit (HOSPITAL_COMMUNITY)
Admission: RE | Admit: 2024-01-01 | Discharge: 2024-01-01 | Disposition: A | Discharge status: Home / Self Care | Source: Ambulatory Visit | Attending: Cardiology | Admitting: Cardiology

## 2024-01-01 DIAGNOSIS — Z952 Presence of prosthetic heart valve: Secondary | ICD-10-CM | POA: Insufficient documentation

## 2024-01-01 LAB — ECHOCARDIOGRAM COMPLETE
Area-P 1/2: 3.85 cm2
MV VTI: 2.54 cm2
S' Lateral: 2.8 cm

## 2024-01-03 ENCOUNTER — Ambulatory Visit: Admitting: Surgery

## 2024-01-10 ENCOUNTER — Ambulatory Visit: Attending: Surgery | Admitting: Surgery

## 2024-01-10 ENCOUNTER — Encounter: Payer: Self-pay | Admitting: Surgery

## 2024-01-10 VITALS — BP 113/84 | HR 100 | Resp 20 | Ht 64.0 in | Wt 125.0 lb

## 2024-01-10 DIAGNOSIS — Z9889 Other specified postprocedural states: Secondary | ICD-10-CM

## 2024-01-10 DIAGNOSIS — Z952 Presence of prosthetic heart valve: Secondary | ICD-10-CM

## 2024-01-10 NOTE — Progress Notes (Signed)
 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  He had some postoperative junctional rhythm which improved and returned to normal sinus rhythm.  He was seen by EP and sent home with a Zio patch.  This showed no significant AV block.  He had perioperative atrial fibrillation/flutter and was sent home on Eliquis .  He is here today with his mother.  He said that he feels well overall and is walking daily without chest pain or shortness of breath.  His stamina continues to improve.  Current Outpatient Medications  Medication Sig Dispense Refill   acetaminophen  (TYLENOL ) 500 MG tablet Take 500 mg by mouth every 6 (six) hours as needed for moderate pain.     albuterol  (VENTOLIN  HFA) 108 (90 Base) MCG/ACT inhaler Inhale 2 puffs into the lungs every 6 (six) hours as needed for wheezing or shortness of breath. 8 g 2   apixaban  (ELIQUIS ) 5 MG TABS tablet Take 1 tablet (5 mg total) by mouth 2 (two) times daily. 60 tablet 3   aspirin  EC 81 MG tablet Take 1 tablet (81 mg total) by mouth daily. Swallow whole. 30 tablet 12   carbamide peroxide (DEBROX) 6.5 % OTIC solution Place 5 drops into the left ear 2 (two) times daily. 15 mL 0   escitalopram  (LEXAPRO ) 20 MG tablet TAKE 1 TABLET(20 MG) BY MOUTH DAILY 90 tablet 1   spironolactone  (ALDACTONE ) 25 MG tablet Take 0.5 tablets (12.5 mg total) by mouth daily. 30 tablet 3   traMADol  (ULTRAM ) 50 MG tablet Take 1 tablet (50 mg total) by mouth every 6 (six) hours as needed for moderate pain (pain score 4-6). 30 tablet 0   bictegravir-emtricitabine -tenofovir  AF  (BIKTARVY ) 50-200-25 MG TABS tablet Take 1 tablet by mouth daily. (Patient not taking: Reported on 01/10/2024) 30 tablet 11   No current facility-administered medications for this visit.     Physical Exam: BP 113/84   Pulse 100   Resp 20   Ht 5' 4 (1.626 m)   Wt 125 lb (56.7 kg)   SpO2 96% Comment: RA  BMI 21.46 kg/m  He looks well. Cardiac exam shows a regular rate and rhythm with normal heart sounds.  There is no murmur. Lungs are clear. The chest incision is well-healed and the sternum is stable. There is no peripheral edema.  Diagnostic Tests:  ECHOCARDIOGRAM REPORT       Patient Name:   Christopher Farrell Date of Exam: 01/01/2024  Medical Rec #:  [id number]          Height:       64.0 in  Accession #:    [id number]         Weight:       137.0 lb  Date of Birth:  1960-11-13          BSA:          1.666 m  Patient Age:    63 years           BP:  120/70 mmHg  Patient Gender: M                  HR:           107 bpm.  Exam Location:  [address]   Procedure: 2D Echo, Cardiac Doppler and Color Doppler (Both Spectral and  Color            Flow Doppler were utilized during procedure).   Indications:    Z95.2 S/p MVR    History:        Patient has prior history of Echocardiogram examinations,  most                 recent 10/03/2023. H/o endocarditis, S/p MVR;  Arrythmias:Atrial                 Flutter and Paroxysmal atrial fibrillation.                    Mitral Valve: 31 mm Medtronic Mosaic bioprosthetic valve  valve                 is present in the mitral position. Procedure Date:  09/29/23.    Sonographer:   Christopher Farrell RDCS  Referring Phys: [id number] Christopher Farrell     Sonographer Comments: Technically difficult study due to poor echo  windows. Image acquisition challenging due to patient body habitus and  Image acquisition challenging due to respiratory motion.  IMPRESSIONS     1. Left ventricular ejection fraction, by estimation, is 60 to 65%.  The  left ventricle has normal function. The left ventricle has no regional  wall motion abnormalities. There is moderate left ventricular hypertrophy.  Left ventricular diastolic  parameters are indeterminate.   2. Right ventricular systolic function is moderately reduced. The right  ventricular size is mildly enlarged. Tricuspid regurgitation signal is  inadequate for assessing PA pressure.   3. Left atrial size was mildly dilated.   4. The mitral valve has been repaired/replaced. Trace mitral valve  regurgitation. The mean mitral valve gradient is 4.0 mmHg with average  heart rate of 102 bpm. There is a 31 mm Medtronic Mosaic bioprosthetic  valve present in the mitral position.  Procedure Date: 09/29/23. Echo findings are consistent with normal  structure and function of the mitral valve prosthesis.   5. The aortic valve is grossly normal. Aortic valve regurgitation is not  visualized. No aortic stenosis is present.   6. The inferior vena cava is normal in size with greater than 50%  respiratory variability, suggesting right atrial pressure of 3 mmHg.   FINDINGS   Left Ventricle: Left ventricular ejection fraction, by estimation, is 60  to 65%. The left ventricle has normal function. The left ventricle has no  regional wall motion abnormalities. The left ventricular internal cavity  size was normal in size. There is   moderate left ventricular hypertrophy. Left ventricular diastolic  parameters are indeterminate.   Right Ventricle: The right ventricular size is mildly enlarged. No  increase in right ventricular wall thickness. Right ventricular systolic  function is moderately reduced. Tricuspid regurgitation signal is  inadequate for assessing PA pressure.   Left Atrium: Left atrial size was mildly dilated.   Right Atrium: Right atrial size was normal in size.   Pericardium: There is no evidence of pericardial effusion.   Mitral Valve: The mitral valve has been  repaired/replaced. Trace mitral  valve regurgitation. There is a 31 mm Medtronic Mosaic bioprosthetic valve  present in the mitral position. Procedure Date: 09/29/23. Echo findings are  consistent with normal structure   and function of the mitral valve prosthesis. MV peak gradient, 7.4 mmHg.  The mean mitral valve gradient is 4.0 mmHg with average heart rate of 102  bpm.   Tricuspid Valve: The tricuspid valve is normal in structure. Tricuspid  valve regurgitation is trivial. No evidence of tricuspid stenosis.   Aortic Valve: The aortic valve is grossly normal. Aortic valve  regurgitation is not visualized. No aortic stenosis is present.   Pulmonic Valve: The pulmonic valve was normal in structure. Pulmonic valve  regurgitation is trivial. No evidence of pulmonic stenosis.   Aorta: The aortic root is normal in size and structure.   Venous: The inferior vena cava is normal in size with greater than 50%  respiratory variability, suggesting right atrial pressure of 3 mmHg.   IAS/Shunts: No atrial level shunt detected by color flow Doppler.     LEFT VENTRICLE  PLAX 2D  LVIDd:         3.90 cm   Diastology  LVIDs:         2.80 cm   LV e' medial:    5.98 cm/s  LV PW:         1.00 cm   LV E/e' medial:  17.4  LV IVS:        1.40 cm   LV e' lateral:   6.53 cm/s  LVOT diam:     2.00 cm   LV E/e' lateral: 15.9  LV SV:         56  LV SV Index:   34  LVOT Area:     3.14 cm     RIGHT VENTRICLE            IVC  RV S prime:     7.94 cm/s  IVC diam: 0.60 cm  TAPSE (M-mode): 0.8 cm   LEFT ATRIUM             Index        RIGHT ATRIUM           Index  LA diam:        3.70 cm 2.22 cm/m   RA Pressure: 3.00 mmHg  LA Vol (A2C):   32.7 ml 19.63 ml/m  RA Area:     13.20 cm  LA Vol (A4C):   37.1 ml 22.27 ml/m  RA Volume:   32.30 ml  19.39 ml/m  LA Biplane Vol: 35.1 ml 21.07 ml/m   AORTIC VALVE  LVOT Vmax:   136.00 cm/s  LVOT Vmean:  77.800 cm/s  LVOT VTI:    0.179 m    AORTA  Ao Root  diam: 3.40 cm  Ao Asc diam:  3.10 cm   MITRAL VALVE                TRICUSPID VALVE  MV Area (PHT): 3.85 cm     Estimated RAP:  3.00 mmHg  MV Area VTI:   2.54 cm  MV Peak grad:  7.4 mmHg     SHUNTS  MV Mean grad:  4.0 mmHg     Systemic VTI:  0.18 m  MV Vmax:       1.36 m/s     Systemic Diam: 2.00 cm  MV Vmean:      92.8 cm/s  MV Decel Time: 197 msec  MV E velocity: 104.00 cm/s  MV A velocity: 122.00 cm/s  MV E/A ratio:  0.85   Soyla Merck MD  Electronically signed by Soyla Merck MD  Signature Date/Time: 01/01/2024/3:15:59 PM        Final     Impression:  He has made a good recovery from his surgery.  Follow-up echocardiogram shows a normally functioning prosthetic mitral valve with a mean gradient of 4 mmHg.  There is no paravalvular leak.  There is trivial tricuspid regurgitation with no evidence of stenosis after repair.  Left ventricular ejection fraction is normal with moderate RV systolic dysfunction as noted preoperatively.  I encouraged him to remain active.  He is currently on Eliquis  for perioperative atrial fibrillation/flutter.  I will leave the decision about continuing this to cardiology.  Plan:  He will continue to follow-up with cardiology and his PCP and I will be happy to see him back if the need arises.     Christopher MARLA Fellers, MD [facility] [phone]

## 2024-01-16 ENCOUNTER — Ambulatory Visit (HOSPITAL_BASED_OUTPATIENT_CLINIC_OR_DEPARTMENT_OTHER): Admitting: Family

## 2024-01-29 ENCOUNTER — Ambulatory Visit: Payer: Self-pay

## 2024-01-29 ENCOUNTER — Other Ambulatory Visit: Payer: Self-pay | Admitting: Nurse Practitioner

## 2024-01-29 ENCOUNTER — Encounter: Payer: Self-pay | Admitting: Nurse Practitioner

## 2024-01-29 ENCOUNTER — Ambulatory Visit: Admitting: Nurse Practitioner

## 2024-01-29 ENCOUNTER — Telehealth: Payer: Self-pay

## 2024-01-29 ENCOUNTER — Encounter (HOSPITAL_COMMUNITY): Payer: Self-pay

## 2024-01-29 ENCOUNTER — Encounter (HOSPITAL_COMMUNITY): Payer: Self-pay | Admitting: *Deleted

## 2024-01-29 ENCOUNTER — Observation Stay (HOSPITAL_COMMUNITY)
Admission: EM | Admit: 2024-01-29 | Discharge: 2024-01-31 | Disposition: A | Discharge status: Home / Self Care | Attending: Internal Medicine | Admitting: Internal Medicine

## 2024-01-29 ENCOUNTER — Ambulatory Visit (HOSPITAL_COMMUNITY): Admission: EM | Admit: 2024-01-29 | Discharge: 2024-01-29 | Disposition: A | Discharge status: Home / Self Care

## 2024-01-29 VITALS — BP 94/62 | HR 113 | Ht 64.0 in | Wt 116.0 lb

## 2024-01-29 DIAGNOSIS — B20 Human immunodeficiency virus [HIV] disease: Secondary | ICD-10-CM | POA: Diagnosis present

## 2024-01-29 DIAGNOSIS — I959 Hypotension, unspecified: Secondary | ICD-10-CM

## 2024-01-29 DIAGNOSIS — J690 Pneumonitis due to inhalation of food and vomit: Secondary | ICD-10-CM | POA: Diagnosis not present

## 2024-01-29 DIAGNOSIS — I4891 Unspecified atrial fibrillation: Secondary | ICD-10-CM | POA: Diagnosis present

## 2024-01-29 DIAGNOSIS — F1920 Other psychoactive substance dependence, uncomplicated: Secondary | ICD-10-CM | POA: Diagnosis not present

## 2024-01-29 DIAGNOSIS — F17210 Nicotine dependence, cigarettes, uncomplicated: Secondary | ICD-10-CM | POA: Insufficient documentation

## 2024-01-29 DIAGNOSIS — Z1152 Encounter for screening for COVID-19: Secondary | ICD-10-CM | POA: Diagnosis not present

## 2024-01-29 DIAGNOSIS — Z8673 Personal history of transient ischemic attack (TIA), and cerebral infarction without residual deficits: Secondary | ICD-10-CM | POA: Diagnosis not present

## 2024-01-29 DIAGNOSIS — F1210 Cannabis abuse, uncomplicated: Secondary | ICD-10-CM | POA: Diagnosis not present

## 2024-01-29 DIAGNOSIS — F1990 Other psychoactive substance use, unspecified, uncomplicated: Secondary | ICD-10-CM | POA: Diagnosis present

## 2024-01-29 DIAGNOSIS — F1410 Cocaine abuse, uncomplicated: Secondary | ICD-10-CM | POA: Insufficient documentation

## 2024-01-29 DIAGNOSIS — F419 Anxiety disorder, unspecified: Secondary | ICD-10-CM | POA: Diagnosis not present

## 2024-01-29 DIAGNOSIS — F32A Depression, unspecified: Secondary | ICD-10-CM | POA: Diagnosis not present

## 2024-01-29 DIAGNOSIS — J432 Centrilobular emphysema: Secondary | ICD-10-CM | POA: Diagnosis present

## 2024-01-29 DIAGNOSIS — F1090 Alcohol use, unspecified, uncomplicated: Secondary | ICD-10-CM | POA: Diagnosis not present

## 2024-01-29 DIAGNOSIS — J439 Emphysema, unspecified: Secondary | ICD-10-CM | POA: Diagnosis not present

## 2024-01-29 DIAGNOSIS — R5383 Other fatigue: Secondary | ICD-10-CM | POA: Diagnosis present

## 2024-01-29 DIAGNOSIS — R059 Cough, unspecified: Secondary | ICD-10-CM | POA: Insufficient documentation

## 2024-01-29 DIAGNOSIS — I50812 Chronic right heart failure: Secondary | ICD-10-CM | POA: Diagnosis present

## 2024-01-29 DIAGNOSIS — Z9889 Other specified postprocedural states: Secondary | ICD-10-CM | POA: Diagnosis present

## 2024-01-29 DIAGNOSIS — R052 Subacute cough: Principal | ICD-10-CM

## 2024-01-29 DIAGNOSIS — I2510 Atherosclerotic heart disease of native coronary artery without angina pectoris: Secondary | ICD-10-CM | POA: Diagnosis present

## 2024-01-29 DIAGNOSIS — D649 Anemia, unspecified: Secondary | ICD-10-CM | POA: Insufficient documentation

## 2024-01-29 DIAGNOSIS — Z952 Presence of prosthetic heart valve: Secondary | ICD-10-CM | POA: Insufficient documentation

## 2024-01-29 DIAGNOSIS — D638 Anemia in other chronic diseases classified elsewhere: Secondary | ICD-10-CM | POA: Diagnosis present

## 2024-01-29 DIAGNOSIS — Z860100 Personal history of colon polyps, unspecified: Secondary | ICD-10-CM | POA: Diagnosis not present

## 2024-01-29 LAB — CBC WITH DIFFERENTIAL/PLATELET
Abs Immature Granulocytes: 0.09 K/uL — ABNORMAL HIGH (ref 0.00–0.07)
Basophils Absolute: 0 K/uL (ref 0.0–0.1)
Basophils Relative: 1 %
Eosinophils Absolute: 0 K/uL (ref 0.0–0.5)
Eosinophils Relative: 0 %
HCT: 43.4 % (ref 39.0–52.0)
Hemoglobin: 14.3 g/dL (ref 13.0–17.0)
Immature Granulocytes: 1 %
Lymphocytes Relative: 33 %
Lymphs Abs: 2.2 K/uL (ref 0.7–4.0)
MCH: 29 pg (ref 26.0–34.0)
MCHC: 32.9 g/dL (ref 30.0–36.0)
MCV: 88 fL (ref 80.0–100.0)
Monocytes Absolute: 0.6 K/uL (ref 0.1–1.0)
Monocytes Relative: 9 %
Neutro Abs: 3.6 K/uL (ref 1.7–7.7)
Neutrophils Relative %: 56 %
Platelets: 231 K/uL (ref 150–400)
RBC: 4.93 MIL/uL (ref 4.22–5.81)
RDW: 13.3 % (ref 11.5–15.5)
WBC: 6.5 K/uL (ref 4.0–10.5)
nRBC: 0 % (ref 0.0–0.2)

## 2024-01-29 LAB — URINALYSIS, ROUTINE W REFLEX MICROSCOPIC
Bilirubin Urine: NEGATIVE
Glucose, UA: NEGATIVE mg/dL
Hgb urine dipstick: NEGATIVE
Ketones, ur: NEGATIVE mg/dL
Leukocytes,Ua: NEGATIVE
Nitrite: NEGATIVE
Protein, ur: 30 mg/dL — AB
Specific Gravity, Urine: 1.023 (ref 1.005–1.030)
pH: 6 (ref 5.0–8.0)

## 2024-01-29 LAB — BASIC METABOLIC PANEL WITH GFR
Anion gap: 12 (ref 5–15)
BUN: 7 mg/dL — ABNORMAL LOW (ref 8–23)
CO2: 28 mmol/L (ref 22–32)
Calcium: 9.2 mg/dL (ref 8.9–10.3)
Chloride: 97 mmol/L — ABNORMAL LOW (ref 98–111)
Creatinine, Ser: 0.72 mg/dL (ref 0.61–1.24)
GFR, Estimated: >60 mL/min (ref >60)
Glucose, Bld: 101 mg/dL — ABNORMAL HIGH (ref 70–99)
Potassium: 3.7 mmol/L (ref 3.5–5.1)
Sodium: 137 mmol/L (ref 135–145)

## 2024-01-29 NOTE — ED Triage Notes (Signed)
 Pts mother is with pt today. Pt seen GI this morning his BP was 90/60 they advised him to be seen by his PCP. He called and advised to see mobile bus today and made him an appt in 02/2024. Pt denies any sx.

## 2024-01-29 NOTE — ED Notes (Signed)
 Patient is being discharged from the Urgent Care and sent to the Emergency Department via POV . Per Asberry Rising, PA-C, patient is in need of higher level of care due to hypotension, tachycardia. Patient is aware and verbalizes understanding of plan of care.  Vitals:   01/29/24 1614 01/29/24 1634  BP: (!) 93/57 103/74  Pulse: (!) 106 (!) 102  Resp: 16 18  Temp: 99.7 F (37.6 C) 98.1 F (36.7 C)  SpO2: 93% 95%

## 2024-01-29 NOTE — Telephone Encounter (Signed)
 FYI Only or Action Required?: FYI only for provider: appointment scheduled on 02/20/24. Advised mobile unit today to be evaluated in the interim until he can be seen by PCP in office  Patient was last seen in primary care on 12/05/2023 by Paseda, Folashade R, FNP.  Called Nurse Triage reporting Hypotension and Cough.  Symptoms began Pts mom unsure.  Interventions attempted: Nothing.  Symptoms are: stable.  Triage Disposition: See PCP When Office is Open (Within 3 Days)  Patient/caregiver understands and will follow disposition?:   Copied from CRM #[id number]. Topic: Clinical - Red Word Triage >> Jan 29, 2024  1:59 PM [place] MATSU wrote: Red Word that prompted transfer to Nurse Triage: Mother Inman Fettig low blood pressure with high pulse. Per patients mother seems normal but is concerned. Reason for Disposition  Patient wants doctor (or NP/PA) to measure BP  Answer Assessment - Initial Assessment Questions Spoke with pts mom Devere (on [place]). Reports pt went to GI office today and BP was 90/60 with HR of 114. Pts SBP noted to be 90-110's and HR 90-100's at past 2 office visits since 12/05/23. Does not take BP medication. Denies taking tramadol  listed on pts med list. Denies CP, SOB, dizziness or weakness. Does not monitor BP at home, encouraged to obtain BP cuff to monitor. Soonest appt with any provider in pt region 12/16. Scheduled soonest office appt and advised pt to be seen at mobile bus today to be evaluated in the meantime. Pts mom reports they will go today. Advised UC or ED for worsening symptoms.   1. BLOOD PRESSURE: What is your blood pressure? Did you take at least two measurements 5 minutes apart?     90/60, HR 114  2. ONSET: When did you take your blood pressure?     Today at GI providers office  3. HOW: How did you take your blood pressure? (e.g., visiting nurse, automatic home BP monitor)     GI providers office  4. HISTORY: Do you have a history of low blood  pressure? What is your blood pressure normally?     Denies  5. MEDICINES: Are you taking any medicines for blood pressure? If Yes, ask: Have they been changed recently?     Denies  6. PULSE RATE: Do you know what your pulse rate is?      HR 114  7. OTHER SYMPTOMS: Have you been sick recently? Have you had a recent injury?     Cough x4 days with clear sputum. Denies CP, fever, dizziness or coughing up blood. Mild SOB.  Protocols used: Blood Pressure - Low-A-AH

## 2024-01-29 NOTE — Telephone Encounter (Signed)
 [facility] Pre-operative Risk Assessment     Request for surgical clearance:     Endoscopy Procedure  What type of surgery is being performed?     Colonoscopy  When is this surgery scheduled?     TBD  What type of clearance is required ?   Pharmacy  Are there any medications that need to be held prior to surgery and how long? Eliquis  - 2 days plus GENERAL CARDIAC CLEARANCE.  Practice name and name of physician performing surgery?      [facility]  What is your office phone and fax number?      Phone- [phone]  Fax- [phone]  Anesthesia type (None, local, MAC, general) ?       MAC   Please route your response to [facility]

## 2024-01-29 NOTE — ED Triage Notes (Signed)
 Patient was sent by PCP for low blood pressure. He denies any other symptoms

## 2024-01-29 NOTE — Patient Instructions (Signed)
 Contact your primary care doctor regarding your cough.  Contact your cardiologist regarding low blood pressure and high pulse.    We will contact you to schedule a colonoscopy after we get clearance from your cardiologist.  _______________________________________________________  If your blood pressure at your visit was 140/90 or greater, please contact your primary care physician to follow up on this.  _______________________________________________________  If you are age 65 or older, your body mass index should be between 23-30. Your Body mass index is 19.91 kg/m. If this is out of the aforementioned range listed, please consider follow up with your Primary Care Provider.  If you are age 44 or younger, your body mass index should be between 19-25. Your Body mass index is 19.91 kg/m. If this is out of the aformentioned range listed, please consider follow up with your Primary Care Provider.   ________________________________________________________  The [facility] providers would like to encourage you to use MYCHART to communicate with providers for non-urgent requests or questions.  Due to long hold times on the telephone, sending your provider a message by [facility] may be a faster and more efficient way to get a response.  Please allow 48 business hours for a response.  Please remember that this is for non-urgent requests.  _______________________________________________________  [facility] is using a team-based approach to care.  Your team is made up of your doctor and two to three APPS. Our APPS (Nurse Practitioners and Physician Assistants) work with your physician to ensure care continuity for you. They are fully qualified to address your health concerns and develop a treatment plan. They communicate directly with your gastroenterologist to care for you. Seeing the Advanced Practice Practitioners on your physician's team can help you by facilitating care more promptly,  often allowing for earlier appointments, access to diagnostic testing, procedures, and other specialty referrals.
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
## 2024-01-29 NOTE — ED Triage Notes (Signed)
 Pt c/o fatigue x unknown amount of time.  Pt was seen at [facility] prior to arrival and sent to ED d/t hypotension.  Pt denies pain and GI/GU complaints.  Pt reports he has been eating and drinking well.

## 2024-01-30 ENCOUNTER — Other Ambulatory Visit: Payer: Self-pay

## 2024-01-30 ENCOUNTER — Emergency Department (HOSPITAL_COMMUNITY)

## 2024-01-30 DIAGNOSIS — J690 Pneumonitis due to inhalation of food and vomit: Secondary | ICD-10-CM | POA: Diagnosis present

## 2024-01-30 DIAGNOSIS — Z9889 Other specified postprocedural states: Secondary | ICD-10-CM | POA: Diagnosis not present

## 2024-01-30 DIAGNOSIS — I4891 Unspecified atrial fibrillation: Secondary | ICD-10-CM

## 2024-01-30 DIAGNOSIS — I959 Hypotension, unspecified: Secondary | ICD-10-CM

## 2024-01-30 DIAGNOSIS — D638 Anemia in other chronic diseases classified elsewhere: Secondary | ICD-10-CM

## 2024-01-30 DIAGNOSIS — Z952 Presence of prosthetic heart valve: Secondary | ICD-10-CM

## 2024-01-30 DIAGNOSIS — B20 Human immunodeficiency virus [HIV] disease: Secondary | ICD-10-CM

## 2024-01-30 DIAGNOSIS — F32A Depression, unspecified: Secondary | ICD-10-CM

## 2024-01-30 DIAGNOSIS — F1990 Other psychoactive substance use, unspecified, uncomplicated: Secondary | ICD-10-CM

## 2024-01-30 DIAGNOSIS — J432 Centrilobular emphysema: Secondary | ICD-10-CM

## 2024-01-30 DIAGNOSIS — F419 Anxiety disorder, unspecified: Secondary | ICD-10-CM

## 2024-01-30 DIAGNOSIS — I50812 Chronic right heart failure: Secondary | ICD-10-CM

## 2024-01-30 DIAGNOSIS — Z8673 Personal history of transient ischemic attack (TIA), and cerebral infarction without residual deficits: Secondary | ICD-10-CM

## 2024-01-30 LAB — PROCALCITONIN: Procalcitonin: <0.1 ng/mL

## 2024-01-30 LAB — CBC
HCT: 39.3 % (ref 39.0–52.0)
Hemoglobin: 13.1 g/dL (ref 13.0–17.0)
MCH: 29.6 pg (ref 26.0–34.0)
MCHC: 33.3 g/dL (ref 30.0–36.0)
MCV: 88.7 fL (ref 80.0–100.0)
Platelets: 196 K/uL (ref 150–400)
RBC: 4.43 MIL/uL (ref 4.22–5.81)
RDW: 13.5 % (ref 11.5–15.5)
WBC: 6.3 K/uL (ref 4.0–10.5)
nRBC: 0 % (ref 0.0–0.2)

## 2024-01-30 LAB — TROPONIN I (HIGH SENSITIVITY): Troponin I (High Sensitivity): 14 ng/L

## 2024-01-30 LAB — I-STAT VENOUS BLOOD GAS, ED
Acid-Base Excess: 6 mmol/L — ABNORMAL HIGH (ref 0.0–2.0)
Bicarbonate: 33.4 mmol/L — ABNORMAL HIGH (ref 20.0–28.0)
Calcium, Ion: 1.12 mmol/L — ABNORMAL LOW (ref 1.15–1.40)
HCT: 45 % (ref 39.0–52.0)
Hemoglobin: 15.3 g/dL (ref 13.0–17.0)
O2 Saturation: 79 %
Potassium: 4.1 mmol/L (ref 3.5–5.1)
Sodium: 138 mmol/L (ref 135–145)
TCO2: 35 mmol/L — ABNORMAL HIGH (ref 22–32)
pCO2, Ven: 57.2 mmHg (ref 44–60)
pH, Ven: 7.375 (ref 7.25–7.43)
pO2, Ven: 46 mmHg — ABNORMAL HIGH (ref 32–45)

## 2024-01-30 LAB — COMPREHENSIVE METABOLIC PANEL WITH GFR
ALT: 36 U/L (ref 0–44)
AST: 56 U/L — ABNORMAL HIGH (ref 15–41)
Albumin: 2.4 g/dL — ABNORMAL LOW (ref 3.5–5.0)
Alkaline Phosphatase: 63 U/L (ref 38–126)
Anion gap: 9 (ref 5–15)
BUN: 6 mg/dL — ABNORMAL LOW (ref 8–23)
CO2: 27 mmol/L (ref 22–32)
Calcium: 8.5 mg/dL — ABNORMAL LOW (ref 8.9–10.3)
Chloride: 97 mmol/L — ABNORMAL LOW (ref 98–111)
Creatinine, Ser: 0.78 mg/dL (ref 0.61–1.24)
GFR, Estimated: >60 mL/min (ref >60)
Glucose, Bld: 120 mg/dL — ABNORMAL HIGH (ref 70–99)
Potassium: 3.9 mmol/L (ref 3.5–5.1)
Sodium: 133 mmol/L — ABNORMAL LOW (ref 135–145)
Total Bilirubin: 0.3 mg/dL (ref 0.0–1.2)
Total Protein: 6.3 g/dL — ABNORMAL LOW (ref 6.5–8.1)

## 2024-01-30 LAB — T-HELPER CELLS (CD4) COUNT (NOT AT ARMC)
CD4 % Helper T Cell: 21 % — ABNORMAL LOW (ref 33–65)
CD4 T Cell Abs: 384 /uL — ABNORMAL LOW (ref 400–1790)

## 2024-01-30 LAB — RAPID URINE DRUG SCREEN, HOSP PERFORMED
Amphetamines: NOT DETECTED
Barbiturates: NOT DETECTED
Benzodiazepines: NOT DETECTED
Cocaine: POSITIVE — AB
Opiates: NOT DETECTED
Tetrahydrocannabinol: POSITIVE — AB

## 2024-01-30 LAB — D-DIMER, QUANTITATIVE: D-Dimer, Quant: 3.15 ug{FEU}/mL — ABNORMAL HIGH (ref 0.00–0.50)

## 2024-01-30 LAB — RESP PANEL BY RT-PCR (RSV, FLU A&B, COVID)  RVPGX2
Influenza A by PCR: NEGATIVE
Influenza B by PCR: NEGATIVE
Resp Syncytial Virus by PCR: NEGATIVE
SARS Coronavirus 2 by RT PCR: NEGATIVE

## 2024-01-30 LAB — T4, FREE: Free T4: 1.02 ng/dL (ref 0.61–1.12)

## 2024-01-30 LAB — TSH: TSH: 0.696 u[IU]/mL (ref 0.350–4.500)

## 2024-01-30 LAB — BRAIN NATRIURETIC PEPTIDE: B Natriuretic Peptide: 66.7 pg/mL (ref 0.0–100.0)

## 2024-01-30 MED ORDER — MIDODRINE HCL 5 MG PO TABS
5.0000 mg | ORAL_TABLET | Freq: Three times a day (TID) | ORAL | Status: DC
  Administered 2024-01-30 – 2024-01-31 (×3): 5 mg via ORAL
  Filled 2024-01-30 (×3): qty 1

## 2024-01-30 MED ORDER — ALBUTEROL SULFATE (2.5 MG/3ML) 0.083% IN NEBU: 3.0000 mL | INHALATION_SOLUTION | Freq: Four times a day (QID) | RESPIRATORY_TRACT | Status: DC | PRN

## 2024-01-30 MED ORDER — POLYETHYLENE GLYCOL 3350 17 G PO PACK: 17.0000 g | PACK | Freq: Every day | ORAL | Status: DC | PRN

## 2024-01-30 MED ORDER — SODIUM CHLORIDE 0.9 % IV SOLN
3.0000 g | Freq: Once | INTRAVENOUS | Status: AC
  Administered 2024-01-30: 3 g via INTRAVENOUS
  Filled 2024-01-30: qty 8

## 2024-01-30 MED ORDER — APIXABAN 5 MG PO TABS
5.0000 mg | ORAL_TABLET | Freq: Two times a day (BID) | ORAL | Status: DC
  Administered 2024-01-30 – 2024-01-31 (×2): 5 mg via ORAL
  Filled 2024-01-30 (×2): qty 1

## 2024-01-30 MED ORDER — LACTATED RINGERS IV BOLUS
1000.0000 mL | Freq: Once | INTRAVENOUS | Status: AC
  Administered 2024-01-30: 1000 mL via INTRAVENOUS

## 2024-01-30 MED ORDER — ACETAMINOPHEN 650 MG RE SUPP: 650.0000 mg | Freq: Four times a day (QID) | RECTAL | Status: DC | PRN

## 2024-01-30 MED ORDER — SODIUM CHLORIDE 0.9% FLUSH
3.0000 mL | Freq: Two times a day (BID) | INTRAVENOUS | Status: DC
  Administered 2024-01-30 – 2024-01-31 (×3): 3 mL via INTRAVENOUS

## 2024-01-30 MED ORDER — ASPIRIN 81 MG PO TBEC
81.0000 mg | DELAYED_RELEASE_TABLET | Freq: Every day | ORAL | Status: DC
  Administered 2024-01-30 – 2024-01-31 (×2): 81 mg via ORAL
  Filled 2024-01-30 (×2): qty 1

## 2024-01-30 MED ORDER — ACETAMINOPHEN 325 MG PO TABS
650.0000 mg | ORAL_TABLET | Freq: Four times a day (QID) | ORAL | Status: DC | PRN
Start: 2024-01-30 — End: 2024-01-31

## 2024-01-30 MED ORDER — IOHEXOL 350 MG/ML SOLN
75.0000 mL | Freq: Once | INTRAVENOUS | Status: AC | PRN
  Administered 2024-01-30: 75 mL via INTRAVENOUS

## 2024-01-30 MED ORDER — SODIUM CHLORIDE 0.9 % IV SOLN
1.5000 g | Freq: Three times a day (TID) | INTRAVENOUS | Status: DC
  Administered 2024-01-30 – 2024-01-31 (×3): 1.5 g via INTRAVENOUS
  Filled 2024-01-30 (×5): qty 4

## 2024-01-30 MED ORDER — BICTEGRAVIR-EMTRICITAB-TENOFOV 50-200-25 MG PO TABS
1.0000 | ORAL_TABLET | Freq: Every day | ORAL | Status: DC
  Administered 2024-01-31: 1 via ORAL
  Filled 2024-01-30: qty 1

## 2024-01-30 NOTE — ED Provider Notes (Signed)
 [place] EMERGENCY DEPARTMENT AT [facility] Provider Note   CSN: [id number] Arrival date & time: 01/29/24  8351     Patient presents with: Fatigue   Christopher Farrell is a 63 y.o. male.   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  The patient states that he has had a cough for unspecified amount of time, urgent care notes document a cough for the past 5 days.  The patient states he has chronically had a cough.  He seemed confused at urgent care and family at urgent care had noted that he seemed out of it and there had been some concern about resumption of drug use.  At urgent care and his GI office appointment visit earlier yesterday morning prior to his ER visit on arrival he was tachycardic to 114 and hypotensive 90/60.  He was sent to the emergency department from urgent care for further evaluation.   Prior to Admission medications   Medication Sig Start Date End Date Taking? Authorizing Provider  acetaminophen  (TYLENOL ) 500 MG tablet Take 500 mg by mouth every 6 (six) hours as needed for moderate pain.    [provider]  albuterol  (VENTOLIN  HFA) 108 (90 Base) MCG/ACT inhaler Inhale 2 puffs into the lungs every 6 (six) hours as needed for wheezing or shortness of breath. 01/30/23   Paseda, Folashade R, FNP  apixaban  (ELIQUIS ) 5 MG TABS tablet Take 1 tablet (5 mg total) by mouth 2 (two) times daily. 10/06/23   Barrett, Rocky SAUNDERS, PA-C  aspirin  EC 81 MG tablet Take 1 tablet (81 mg total) by mouth daily. Swallow whole. 10/06/23   Barrett, Erin R, PA-C  bictegravir-emtricitabine -tenofovir  AF (BIKTARVY ) 50-200-25 MG  TABS tablet Take 1 tablet by mouth daily. 11/10/22 01/28/26  Vu, [name], MD  carbamide peroxide (DEBROX) 6.5 % OTIC solution Place 5 drops into the left ear 2 (two) times daily. 12/05/23   Paseda, Folashade R, FNP  escitalopram  (LEXAPRO ) 20 MG tablet TAKE 1 TABLET(20 MG) BY MOUTH DAILY 03/02/23   Oley Bascom RAMAN, NP  spironolactone  (ALDACTONE ) 25 MG tablet Take 0.5 tablets (12.5 mg total) by mouth daily. 10/06/23   Barrett, Erin R, PA-C  traMADol  (ULTRAM ) 50 MG tablet Take 1 tablet (50 mg total) by mouth every 6 (six) hours as needed for moderate pain (pain score 4-6). 10/06/23   Barrett, Rocky SAUNDERS, PA-C    Allergies: Patient has no known allergies.    Review of Systems  All other systems reviewed and are negative.   Updated Vital Signs BP (!) 87/72 (BP Location: Right Arm)   Pulse (!) 101   Temp 98.1 F (36.7 C) (Oral)   Resp 14   Ht 5' 4 (1.626 m)   Wt 52.6 kg   SpO2 100%   BMI 19.91 kg/m   Physical Exam Vitals and nursing note reviewed.  Constitutional:      General: He is not in acute distress.    Appearance: He is well-developed.  HENT:     Head: Normocephalic and atraumatic.  Eyes:     Conjunctiva/sclera: Conjunctivae normal.  Cardiovascular:     Rate and Rhythm: Normal rate and regular rhythm.     Heart sounds: No  murmur heard. Pulmonary:     Effort: Pulmonary effort is normal. No respiratory distress.     Breath sounds: Normal breath sounds.  Abdominal:     Palpations: Abdomen is soft.     Tenderness: There is no abdominal tenderness.  Musculoskeletal:        General: No swelling.     Cervical back: Neck supple.  Skin:    General: Skin is warm and dry.     Capillary Refill: Capillary refill takes less than 2 seconds.  Neurological:     Mental Status: He is alert.  Psychiatric:        Mood and Affect: Mood normal.     (all labs ordered are listed, but only abnormal results are displayed) Labs Reviewed  CBC WITH DIFFERENTIAL/PLATELET - Abnormal; Notable for  the following components:      Result Value   Abs Immature Granulocytes 0.09 (*)    All other components within normal limits  BASIC METABOLIC PANEL WITH GFR - Abnormal; Notable for the following components:   Chloride 97 (*)    Glucose, Bld 101 (*)    BUN 7 (*)    All other components within normal limits  URINALYSIS, ROUTINE W REFLEX MICROSCOPIC - Abnormal; Notable for the following components:   Color, Urine AMBER (*)    APPearance HAZY (*)    Protein, ur 30 (*)    Bacteria, UA RARE (*)    All other components within normal limits  RAPID URINE DRUG SCREEN, HOSP PERFORMED - Abnormal; Notable for the following components:   Cocaine POSITIVE (*)    Tetrahydrocannabinol POSITIVE (*)    All other components within normal limits  T-HELPER CELLS (CD4) COUNT (NOT AT [facility]) - Abnormal; Notable for the following components:   CD4 T Cell Abs 384 (*)    CD4 % Helper T Cell 21 (*)    All other components within normal limits  D-DIMER, QUANTITATIVE - Abnormal; Notable for the following components:   D-Dimer, Quant 3.15 (*)    All other components within normal limits  I-STAT VENOUS BLOOD GAS, ED - Abnormal; Notable for the following components:   pO2, Ven 46 (*)    Bicarbonate 33.4 (*)    TCO2 35 (*)    Acid-Base Excess 6.0 (*)    Calcium , Ion 1.12 (*)    All other components within normal limits  TSH  T4, FREE  BRAIN NATRIURETIC PEPTIDE  HIV-1 RNA QUANT-NO REFLEX-BLD  TROPONIN I (HIGH SENSITIVITY)    EKG: EKG Interpretation Date/Time:  Tuesday January 30 2024 07:48:25 EST Ventricular Rate:  114 PR Interval:  130 QRS Duration:  68 QT Interval:  354 QTC Calculation: 487 R Axis:   -15  Text Interpretation: Sinus tachycardia No significant change since last tracing Confirmed by Jerrol Agent ([id number]) on 01/30/2024 7:52:59 AM  Radiology: CT Angio Chest PE W and/or Wo Contrast Result Date: 01/30/2024 EXAM: CTA CHEST AORTA 01/30/2024 10:55:53 AM TECHNIQUE: CTA of the chest was  performed after the administration of 75 mL of iohexol  (OMNIPAQUE ) 350 MG/ML intravenous contrast. Multiplanar reformatted images are provided for review. MIP images are provided for review. Automated exposure control, iterative reconstruction, and/or weight based adjustment of the mA/kV was utilized to reduce the radiation dose to as low as reasonably achievable. COMPARISON: CT of the chest dated 09/05/2022. CLINICAL HISTORY: Pulmonary embolism (PE) suspected, low to intermediate prob, positive D-dimer. FINDINGS: AORTA: The thoracic aorta demonstrates mild calcific atheromatous disease. No thoracic aortic dissection. No aneurysm. MEDIASTINUM:  The heart and pericardium demonstrate no acute abnormality. The patient is status post tricuspid and mitral valve repair and clipping of the left atrial appendage. No mediastinal lymphadenopathy. LYMPH NODES: No mediastinal, hilar or axillary lymphadenopathy. LUNGS AND PLEURA: There is no evidence of pulmonary embolus. There is mucus plugging and bronchial wall thickening present within the right lower lobe and to a lesser extent the right middle lobe, with patchy, streaky areas of peripheral peribronchovascular consolidation. A few patchy, streaky peripheral opacities are also present within the base of the lingula. There is mild-to-moderate central lobular emphysema. No pleural effusion or pneumothorax. UPPER ABDOMEN: Limited images of the upper abdomen are unremarkable. SOFT TISSUES AND BONES: No acute bone or soft tissue abnormality. IMPRESSION: 1. No evidence of pulmonary embolus. 2. Findings of mucus plugging, bronchial wall thickening, and patchy peripheral peribronchovascular consolidation in the right lower lobe (to a lesser extent right middle lobe) and lingular base, compatible with infectious/inflammatory bronchiolitis or aspiration pneumonitis; correlate clinically. 3. Mild-to-moderate centrilobular emphysema; if patient is 63 years old, consider evaluation for  eligibility for a low-dose CT lung cancer screening program, as emphysema is an independent risk factor for lung cancer. 4. Status post tricuspid and mitral valve repair and left atrial appendage clip. Electronically signed by: Evalene Coho MD 01/30/2024 11:13 AM EST RP Workstation: [id number]   DG Chest 2 View Result Date: 01/30/2024 EXAM: 2 VIEW(S) XRAY OF THE CHEST 01/30/2024 08:04:00 AM COMPARISON: 10/30/2023 CLINICAL HISTORY: cough, fatigue FINDINGS: LUNGS AND PLEURA: No focal pulmonary opacity. No pleural effusion. No pneumothorax. HEART AND MEDIASTINUM: Prosthetic cardiac valve. Left atrial clipping noted. CABG markers. Aortic atherosclerosis. BONES AND SOFT TISSUES: Sternal wires noted. IMPRESSION: 1. No acute cardiopulmonary process. Electronically signed by: Katheleen Faes MD 01/30/2024 09:00 AM EST RP Workstation: [id number]   CT Head Wo Contrast Result Date: 01/30/2024 EXAM: CT HEAD WITHOUT 01/30/2024 08:12:14 AM TECHNIQUE: CT of the head was performed without the administration of intravenous contrast. Automated exposure control, iterative reconstruction, and/or weight based adjustment of the mA/kV was utilized to reduce the radiation dose to as low as reasonably achievable. COMPARISON: Head CT 09/21/2022 and MRI 09/20/2022. CLINICAL HISTORY: Mental status change, unknown cause. FINDINGS: BRAIN AND VENTRICLES: There is no evidence of an acute infarct, intracranial hemorrhage, mass, midline shift, hydrocephalus, or extra-axial fluid collection. Cortical and subcortical encephalomalacia has developed in the right parietal lobe at the site of the previously shown infarct which was acute in 2024. Hypodensities elsewhere in the cerebral white matter bilaterally are nonspecific but compatible with mild chronic small vessel ischemic disease. Calcified atherosclerosis at the skull base. ORBITS: Remote bilateral medial orbital fractures. SINUSES AND MASTOIDS: Mild right ethmoid air cell opacification.  Clear mastoid air cells. SOFT TISSUES AND SKULL: No acute skull fracture. No acute soft tissue abnormality. IMPRESSION: 1. No acute intracranial abnormality. 2. Chronic right parietal infarct. 3. Mild chronic small vessel ischemic disease. Electronically signed by: Dasie Hamburg MD 01/30/2024 08:33 AM EST RP Workstation: [id number]     Procedures   Medications Ordered in the ED  Ampicillin -Sulbactam (UNASYN ) 3 g in sodium chloride  0.9 % 100 mL IVPB (has no administration in time range)  lactated ringers  bolus 1,000 mL (has no administration in time range)  lactated ringers  bolus 1,000 mL (0 mLs Intravenous Stopped 01/30/24 1035)  iohexol  (OMNIPAQUE ) 350 MG/ML injection 75 mL (75 mLs Intravenous Contrast Given 01/30/24 1053)  Medical Decision Making Amount and/or Complexity of Data Reviewed Labs: ordered. Radiology: ordered.  Risk Prescription drug management. Decision regarding hospitalization.    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  The patient states that he has had a cough for unspecified amount of time, urgent care notes document a cough for the past 5 days.  The patient states he has chronically had a cough.  He seemed confused at urgent care and family at urgent care had noted that he seemed out of it and there had been some concern about resumption of drug use.  At urgent care and his GI office appointment visit earlier yesterday morning prior to his ER visit on arrival he was tachycardic to 114 and hypotensive 90/60.  He was sent to the emergency department from urgent care for further  evaluation.   On arrival, the patient was afebrile, tachycardic heart rate 107, respiratory rate 18, BP 98/79, saturating 100% on room air.  Patient presenting with subacute cough, found to be tachycardic with soft blood pressures.  Considered pneumonia, considered PE, consider viral infectious etiology.  EKG: Sinus tachycardia, ventricular rate 114.  Chest x-ray: No acute pulmonary process.  CT head change in the with the patient's confusion noted yesterday: IMPRESSION:  1. No acute intracranial abnormality.  2. Chronic right parietal infarct.  3. Mild chronic small vessel ischemic disease.   Patient does not appear acutely confused or somnolent today, his mother is bedside and states that he is at his baseline mental status.  She was concerned about his blood pressure medication in the setting of his soft blood pressures.  Labs: Cardiac troponin 14, TSH normal, D-dimer elevated at 3.15, urinalysis negative for UTI, VBG without an acidosis or alkalosis, CD4 count 384, UDS positive for cocaine and THC, urinalysis negative for UTI, BMP generally unremarkable, CBC without a leukocytosis or anemia  CTA PE study obtained in the setting of an elevated D-dimer and subacute cough: IMPRESSION:  1. No evidence of pulmonary embolus.  2. Findings of mucus plugging, bronchial wall thickening, and patchy peripheral  peribronchovascular consolidation in the right lower lobe (to a lesser extent  right middle lobe) and lingular base, compatible with infectious/inflammatory  bronchiolitis or aspiration pneumonitis; correlate clinically.  3. Mild-to-moderate centrilobular emphysema; if patient is 63 years old,  consider evaluation for eligibility for a low-dose CT lung cancer screening  program, as emphysema is an independent risk factor for lung cancer.  4. Status post tricuspid and mitral valve repair and left atrial appendage clip.    Repeat blood pressures after initial IV fluid resuscitation 1 L  were borderline hypotensive and subsequently hypotensive 87/72.  He remains persistently tachycardic.  In the setting of the patient's findings on CT and hypotension, additional IV fluid bolus was ordered and the patient was started on IV Unasyn  for presumed aspiration pneumonia.  Given his soft blood pressures, medicine was consulted for admission for observation, Dr. Seena accepting.     Final diagnoses:  Subacute cough  Aspiration pneumonia, unspecified aspiration pneumonia type, unspecified laterality, unspecified part of lung (HCC)  Hypotension, unspecified hypotension type    ED Discharge Orders     None          Jerrol Agent,  MD 01/30/24 1256

## 2024-01-30 NOTE — Discharge Instructions (Addendum)
 Follow with Primary MD Paseda, Folashade R, FNP in 7 days   Get CBC, CMP, 2 view Chest X ray checked  by Primary MD next visit.    Activity: As tolerated with Full fall precautions use walker/cane & assistance as needed   Disposition Home    Diet: Heart Healthy    On your next visit with your primary care physician please Get Medicines reviewed and adjusted.   Please request your Prim.MD to go over all Hospital Tests and Procedure/Radiological results at the follow up, please get all Hospital records sent to your Prim MD by signing hospital release before you go home.   If you experience worsening of your admission symptoms, develop shortness of breath, life threatening emergency, suicidal or homicidal thoughts you must seek medical attention immediately by calling 911 or calling your MD immediately  if symptoms less severe.  You Must read complete instructions/literature along with all the possible adverse reactions/side effects for all the Medicines you take and that have been prescribed to you. Take any new Medicines after you have completely understood and accpet all the possible adverse reactions/side effects.   Do not drive, operating heavy machinery, perform activities at heights, swimming or participation in water  activities or provide baby sitting services if your were admitted for syncope or siezures until you have seen by Primary MD or a Neurologist and advised to do so again.  Do not drive when taking Pain medications.    Do not take more than prescribed Pain, Sleep and Anxiety Medications  Special Instructions: If you have smoked or chewed Tobacco  in the last 2 yrs please stop smoking, stop any regular Alcohol  and or any Recreational drug use.  Wear Seat belts while driving.   Please note  You were cared for by a hospitalist during your hospital stay. If you have any questions about your discharge medications or the care you received while you were in the hospital  after you are discharged, you can call the unit and asked to speak with the hospitalist on call if the hospitalist that took care of you is not available. Once you are discharged, your primary care physician will handle any further medical issues. Please note that NO REFILLS for any discharge medications will be authorized once you are discharged, as it is imperative that you return to your primary care physician (or establish a relationship with a primary care physician if you do not have one) for your aftercare needs so that they can reassess your need for medications and monitor your lab values.

## 2024-01-30 NOTE — ED Notes (Signed)
 Pt transported to xray
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
## 2024-01-31 ENCOUNTER — Other Ambulatory Visit (HOSPITAL_COMMUNITY): Payer: Self-pay

## 2024-01-31 DIAGNOSIS — J690 Pneumonitis due to inhalation of food and vomit: Secondary | ICD-10-CM | POA: Diagnosis not present

## 2024-01-31 LAB — COMPREHENSIVE METABOLIC PANEL WITH GFR
ALT: 29 U/L (ref 0–44)
AST: 42 U/L — ABNORMAL HIGH (ref 15–41)
Albumin: 2.2 g/dL — ABNORMAL LOW (ref 3.5–5.0)
Alkaline Phosphatase: 62 U/L (ref 38–126)
Anion gap: 9 (ref 5–15)
BUN: 9 mg/dL (ref 8–23)
CO2: 28 mmol/L (ref 22–32)
Calcium: 8.3 mg/dL — ABNORMAL LOW (ref 8.9–10.3)
Chloride: 100 mmol/L (ref 98–111)
Creatinine, Ser: 0.85 mg/dL (ref 0.61–1.24)
GFR, Estimated: >60 mL/min (ref >60)
Glucose, Bld: 92 mg/dL (ref 70–99)
Potassium: 3.6 mmol/L (ref 3.5–5.1)
Sodium: 137 mmol/L (ref 135–145)
Total Bilirubin: 0.6 mg/dL (ref 0.0–1.2)
Total Protein: 5.7 g/dL — ABNORMAL LOW (ref 6.5–8.1)

## 2024-01-31 LAB — RESPIRATORY PANEL BY PCR

## 2024-01-31 LAB — CBC
HCT: 36.5 % — ABNORMAL LOW (ref 39.0–52.0)
Hemoglobin: 12 g/dL — ABNORMAL LOW (ref 13.0–17.0)
MCH: 29.2 pg (ref 26.0–34.0)
MCHC: 32.9 g/dL (ref 30.0–36.0)
MCV: 88.8 fL (ref 80.0–100.0)
Platelets: 182 K/uL (ref 150–400)
RBC: 4.11 MIL/uL — ABNORMAL LOW (ref 4.22–5.81)
RDW: 13.5 % (ref 11.5–15.5)
WBC: 6.6 K/uL (ref 4.0–10.5)
nRBC: 0 % (ref 0.0–0.2)

## 2024-01-31 LAB — PROCALCITONIN: Procalcitonin: <0.1 ng/mL

## 2024-01-31 MED ORDER — AMOXICILLIN-POT CLAVULANATE 875-125 MG PO TABS
1.0000 | ORAL_TABLET | Freq: Two times a day (BID) | ORAL | 0 refills | Status: AC
  Filled 2024-01-31: qty 10, 5d supply, fill #0

## 2024-01-31 MED ORDER — PANTOPRAZOLE SODIUM 40 MG PO TBEC
40.0000 mg | DELAYED_RELEASE_TABLET | Freq: Every day | ORAL | 0 refills | Status: AC
  Filled 2024-01-31: qty 30, 30d supply, fill #0

## 2024-01-31 MED ORDER — PREDNISONE 10 MG (21) PO TBPK
ORAL_TABLET | ORAL | 0 refills | Status: DC
  Filled 2024-01-31: qty 21, 6d supply, fill #0

## 2024-01-31 MED ORDER — METHYLPREDNISOLONE SODIUM SUCC 125 MG IJ SOLR
80.0000 mg | Freq: Once | INTRAMUSCULAR | Status: AC
  Administered 2024-01-31: 80 mg via INTRAVENOUS
  Filled 2024-01-31: qty 2

## 2024-01-31 MED ORDER — FUROSEMIDE 40 MG PO TABS: 40.0000 mg | ORAL_TABLET | Freq: Every day | ORAL | Status: DC

## 2024-01-31 MED ORDER — MIDODRINE HCL 5 MG PO TABS
5.0000 mg | ORAL_TABLET | Freq: Three times a day (TID) | ORAL | 0 refills | Status: DC
  Filled 2024-01-31: qty 90, 30d supply, fill #0

## 2024-01-31 NOTE — Plan of Care (Signed)

## 2024-01-31 NOTE — Telephone Encounter (Signed)
 Patient currently admitted. Will hold off on clearance until he is d/c

## 2024-01-31 NOTE — TOC Progression Note (Signed)
 Transition of Care [facility]) - Progression Note    Patient Details  Name: Christopher Farrell MRN: [id number] Date of Birth: 1961/01/26  Transition of Care [facility]) CM/SW Contact  Bernardino Dean, [place] Phone Number: 01/31/2024, 11:55 AM  Clinical Narrative:    CSW met with patient at bedside to discuss substance use behavior. Patient endorses crack/cocaine and marijuana usage. States it is daily, amount varies. Denies other substances and IV use. Expresses motivation for behavior change, lists main motivational factor as his mother - states he doesn't want her to worry about him, knows it bothers her. States he had recently stopped for several months. Identifies situations that may contribute to his relapse and repeated use, including hanging around certain people and going out. States if he stayed in more, he would use less. States since it has been several days since last use, he will attempt to stay abstinent upon discharge. He wants to try to do this himself, has done it this way before, was not interested in exploring treatment providers. He was agreeable to receiving SUD treatment information, which was provided. Follow up was strongly encouraged. We discussed if he finds initial or continued abstinence difficult without treatment, it may be worth reaching out to a treatment provider. He was amenable to this plan. Education provided on different LOC of SUD treatment. Patient was given graham crackers and will be discharging later today, he shared no concerns with this.   Expected Discharge Plan: Home/Self Care Barriers to Discharge: No Barriers Identified               Expected Discharge Plan and Services         Expected Discharge Date: 01/31/24                                     Social Drivers of Health (SDOH) Interventions SDOH Screenings   Food Insecurity: No Food Insecurity (01/30/2024)  Housing: Low Risk  (01/30/2024)  Transportation Needs: Unmet Transportation  Needs (01/30/2024)  Utilities: Not At Risk (01/30/2024)  Depression (PHQ2-9): Low Risk  (12/05/2023)  Tobacco Use: High Risk (01/29/2024)    Readmission Risk Interventions    10/06/2023    1:36 PM 10/06/2022   11:54 AM  Readmission Risk Prevention Plan  Post Dischage Appt Complete   Medication Screening Complete   Transportation Screening Complete Complete  PCP or Specialist Appt within 5-7 Days  Complete  Home Care Screening  Complete  Medication Review (RN CM)  Complete

## 2024-01-31 NOTE — Discharge Summary (Signed)
 Physician Discharge Summary  Christopher Farrell FMW:[id number] DOB: Jun 29, 1960 DOA: 01/29/2024  PCP: Paseda, Folashade R, FNP  Admit date: 01/29/2024 Discharge date: 01/31/2024  Admitted From: (Home) Disposition:  (Home )  Recommendations for Outpatient Follow-up:  Follow up with PCP in 1-2 weeks Please obtain BMP/CBC in one week    Diet recommendation:  Regular    Christopher Farrell is a 63 y.o. 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   History obtained with assistance of chart review.  Patient was initially seen yesterday at his GI office and he was noted to have tachycardia to 114 and blood pressure in the 90s.  They recommended he be seen by his PCP.  Patient ended up being seen at urgent care and was accompanied by his mother.   Mother has had concern about patient being more out of it and more quiet than typical.  She was concerned that he had been using drugs.  Patient has reported feeling fine throughout the evaluation but has reported a cough for the past 4 to 5 days that has been intermittent as well as some fatigue.  After evaluation in urgent care due to patient's persistent hypotension he was sent to the ED for further evaluation.   While in the ED undergoing workup mother reports his mentation has improved to baseline.   Patient denies fevers, chills, chest pain, shortness of breath, abdominal pain, constipation, diarrhea, nausea, vomiting.   ED Course: Vital signs in the ED notable for heart rate in the 90s-110s, blood pressure in the 80s-90s with a single reading in the 120s systolic.   Lab workup included BMP yesterday with chloride 97.  CBC  yesterday normal.  Urinalysis yesterday with protein, rare bacteria only.  Further workup today included D-dimer elevated to 3.15, BNP pending, respiratory panel pending.  TSH normal.  T4 normal.  UDS positive for cocaine and THC.  CD4 count okay at 348.  HIV quantitative pending.  VBG with normal pH and normal pCO2.   Imaging included chest x-ray which showed no acute normality.  CT head which showed no acute abnormality.  CTA PE study was negative for PE but did show mucous plugging with bronchial wall thickening and patchy Perry bronchovascular consolidation concerning for aspiration pneumonia versus bronchiolitis.  Also noted was mild to moderate emphysema and status post mitral valve repair, aortic valve repair, atrial clip.   Patient started on Unasyn  and received 2 L IV fluid in the ED.  With persistently low blood pressures and tachycardia despite 2 L of fluid hospitalist team consulted for admission for these persistent abnormal vitals and concern for aspiration pneumonia.  Aspiration pneumonitis Hypotension Tachycardia - Patient initially presented to urgent care  with tachycardia and hypotension and mother was concerned the patient has been out of it and more quiet with concern that he was using drugs.   - Patient's UDS did come back positive for cocaine and THC  - Imaging showed evidence of aspiration pneumonia versus bronchiolitis.  Possibly could have aspirated while he was less alert. -Started on Unasyn  in the ED.  Not requiring oxygen.  Afebrile, leukocytosis, will discharge on Augmentin  . - Blood pressure has been soft, started on midodrine , ambulated in the hallway today, asymptomatic, he will be discharged on  midodrine , this can be tapered off as outpatient . -He was provided incentive spirometer and flutter valve, instructed to take him home and encouraged to keep use both at home.     Chronic right heart failure Status post mitral valve repair Status post tricuspid valve  repair Status post atrial appendage ligation - Last echo was in October of this year and showed EF 60-6 5%, indeterminate diastolic function, moderately reduced RV function, status post mitral valve and aortic valve repair. - Underwent operative repair of these in August of this year.  Also has history of endocarditis and septic emboli. - Continue home Lasix  and Aldactone    History of CVA - Secondary to septic emboli, no new focal deficits   CAD - Continue ASA, Eliquis    HIV disease > CD4 in the ED 387.  HIV quant pending. - Continue home Biktarvy    Anxiety Depression - Not on medication for this   Emphysema - Continue as needed albuterol    Anemia - Hemoglobin stable   Atrial fibrillation - Not on rate or rhythm control medication - Continue Eliquis    Substance use Cocaine abuse THC abuse - Mother was concerned that he may be using drugs and UDS positive for cocaine and THC in the ED.  - Counseled on cessation     Discharge Diagnoses:  Principal Problem:   Aspiration pneumonia (HCC) Active Problems:   Hypotension   Anxiety and depression   Polysubstance use disorder   Anemia, chronic disease   HIV disease (HCC)   History of CVA (cerebrovascular accident)   Chronic right heart failure (HCC)   S/P MVR (mitral valve replacement)   S/P TVR (tricuspid valve repair)   S/P left atrial appendage ligation   Afib (HCC)   Mild CAD   Centrilobular emphysema (HCC)    Discharge Instructions  Discharge Instructions     Diet - low sodium heart healthy   Complete by: As directed    Discharge instructions   Complete by: As directed    Follow with Primary MD Paseda, Folashade R, FNP in 7 days   Get CBC, CMP, 2 view Chest X ray checked  by Primary MD next visit.    Activity: As tolerated with Full fall precautions use walker/cane & assistance as needed   Disposition Home    Diet: Heart Healthy    On your next visit with your primary care physician please  Get Medicines reviewed and adjusted.   Please request your Prim.MD to go over all Hospital Tests and Procedure/Radiological results at the follow up, please get all Hospital records sent to your Prim MD by signing hospital release before you go home.   If you experience worsening of your admission symptoms, develop shortness of breath, life threatening emergency, suicidal or homicidal thoughts you must seek medical attention immediately by calling 911 or calling your MD immediately  if symptoms less severe.  You Must read complete instructions/literature along with all the possible adverse reactions/side effects for all the Medicines you take and that have been prescribed to you. Take any new Medicines after you have completely understood and accpet all the possible adverse reactions/side effects.   Do not drive, operating heavy machinery, perform activities at heights, swimming or participation in water  activities or provide baby sitting services if your were admitted for syncope or siezures until you have seen by Primary MD or a Neurologist and advised to do so again.  Do not drive when taking Pain medications.    Do not take  more than prescribed Pain, Sleep and Anxiety Medications  Special Instructions: If you have smoked or chewed Tobacco  in the last 2 yrs please stop smoking, stop any regular Alcohol  and or any Recreational drug use.  Wear Seat belts while driving.   Please note  You were cared for by a hospitalist during your hospital stay. If you have any questions about your discharge medications or the care you received while you were in the hospital after you are discharged, you can call the unit and asked to speak with the hospitalist on call if the hospitalist that took care of you is not available. Once you are discharged, your primary care physician will handle any further medical issues. Please note that NO REFILLS for any discharge medications will be authorized once you are  discharged, as it is imperative that you return to your primary care physician (or establish a relationship with a primary care physician if you do not have one) for your aftercare needs so that they can reassess your need for medications and monitor your lab values.   Increase activity slowly   Complete by: As directed       Allergies as of 01/31/2024   No Known Allergies      Medication List     TAKE these medications    acetaminophen  500 MG tablet Commonly known as: TYLENOL  Take 1,000 mg by mouth 2 (two) times daily as needed for headache (pain).   albuterol  108 (90 Base) MCG/ACT inhaler Commonly known as: VENTOLIN  HFA Inhale 2 puffs into the lungs every 6 (six) hours as needed for wheezing or shortness of breath.   amoxicillin -clavulanate 875-125 MG tablet Commonly known as: AUGMENTIN  Take 1 tablet by mouth 2 (two) times daily for 5 days.   Aspirin  Adult Low Strength 81 MG tablet Generic drug: aspirin  EC Take 1 tablet (81 mg total) by mouth daily. Swallow whole.   bictegravir-emtricitabine -tenofovir  AF 50-200-25 MG Tabs tablet Commonly known as: BIKTARVY  Take 1 tablet by mouth daily.   Eliquis  5 MG Tabs tablet Generic drug: apixaban  Take 1 tablet (5 mg total) by mouth 2 (two) times daily.   furosemide  40 MG tablet Commonly known as: LASIX  Take 1 tablet (40 mg total) by mouth daily. Start taking on: February 04, 2024 What changed:  how much to take These instructions start on February 04, 2024. If you are unsure what to do until then, ask your doctor or other care provider.   midodrine  5 MG tablet Commonly known as: PROAMATINE  Take 1 tablet (5 mg total) by mouth 3 (three) times daily with meals.   pantoprazole  40 MG tablet Commonly known as: Protonix  Take 1 tablet (40 mg total) by mouth daily.   potassium chloride  SA 20 MEQ tablet Commonly known as: KLOR-CON  M Take 20 mEq by mouth daily.   predniSONE  10 MG (21) Tbpk tablet Commonly known as: STERAPRED  UNI-PAK 21 TAB Use per package instructions   spironolactone  25 MG tablet Commonly known as: ALDACTONE  Take 0.5 tablets (12.5 mg total) by mouth daily.        No Known Allergies  Consultations: None  Procedures/Studies: CT Angio Chest PE W and/or Wo Contrast Result Date: 01/30/2024 EXAM: CTA CHEST AORTA 01/30/2024 10:55:53 AM TECHNIQUE: CTA of the chest was performed after the administration of 75 mL of iohexol  (OMNIPAQUE ) 350 MG/ML intravenous contrast. Multiplanar reformatted images are provided for review. MIP images are provided for review. Automated exposure control, iterative reconstruction, and/or weight based adjustment of the  mA/kV was utilized to reduce the radiation dose to as low as reasonably achievable. COMPARISON: CT of the chest dated 09/05/2022. CLINICAL HISTORY: Pulmonary embolism (PE) suspected, low to intermediate prob, positive D-dimer. FINDINGS: AORTA: The thoracic aorta demonstrates mild calcific atheromatous disease. No thoracic aortic dissection. No aneurysm. MEDIASTINUM: The heart and pericardium demonstrate no acute abnormality. The patient is status post tricuspid and mitral valve repair and clipping of the left atrial appendage. No mediastinal lymphadenopathy. LYMPH NODES: No mediastinal, hilar or axillary lymphadenopathy. LUNGS AND PLEURA: There is no evidence of pulmonary embolus. There is mucus plugging and bronchial wall thickening present within the right lower lobe and to a lesser extent the right middle lobe, with patchy, streaky areas of peripheral peribronchovascular consolidation. A few patchy, streaky peripheral opacities are also present within the base of the lingula. There is mild-to-moderate central lobular emphysema. No pleural effusion or pneumothorax. UPPER ABDOMEN: Limited images of the upper abdomen are unremarkable. SOFT TISSUES AND BONES: No acute bone or soft tissue abnormality. IMPRESSION: 1. No evidence of pulmonary embolus. 2. Findings of  mucus plugging, bronchial wall thickening, and patchy peripheral peribronchovascular consolidation in the right lower lobe (to a lesser extent right middle lobe) and lingular base, compatible with infectious/inflammatory bronchiolitis or aspiration pneumonitis; correlate clinically. 3. Mild-to-moderate centrilobular emphysema; if patient is 63 years old, consider evaluation for eligibility for a low-dose CT lung cancer screening program, as emphysema is an independent risk factor for lung cancer. 4. Status post tricuspid and mitral valve repair and left atrial appendage clip. Electronically signed by: Evalene Coho MD 01/30/2024 11:13 AM EST RP Workstation: [id number]   DG Chest 2 View Result Date: 01/30/2024 EXAM: 2 VIEW(S) XRAY OF THE CHEST 01/30/2024 08:04:00 AM COMPARISON: 10/30/2023 CLINICAL HISTORY: cough, fatigue FINDINGS: LUNGS AND PLEURA: No focal pulmonary opacity. No pleural effusion. No pneumothorax. HEART AND MEDIASTINUM: Prosthetic cardiac valve. Left atrial clipping noted. CABG markers. Aortic atherosclerosis. BONES AND SOFT TISSUES: Sternal wires noted. IMPRESSION: 1. No acute cardiopulmonary process. Electronically signed by: Katheleen Faes MD 01/30/2024 09:00 AM EST RP Workstation: [id number]   CT Head Wo Contrast Result Date: 01/30/2024 EXAM: CT HEAD WITHOUT 01/30/2024 08:12:14 AM TECHNIQUE: CT of the head was performed without the administration of intravenous contrast. Automated exposure control, iterative reconstruction, and/or weight based adjustment of the mA/kV was utilized to reduce the radiation dose to as low as reasonably achievable. COMPARISON: Head CT 09/21/2022 and MRI 09/20/2022. CLINICAL HISTORY: Mental status change, unknown cause. FINDINGS: BRAIN AND VENTRICLES: There is no evidence of an acute infarct, intracranial hemorrhage, mass, midline shift, hydrocephalus, or extra-axial fluid collection. Cortical and subcortical encephalomalacia has developed in the right  parietal lobe at the site of the previously shown infarct which was acute in 2024. Hypodensities elsewhere in the cerebral white matter bilaterally are nonspecific but compatible with mild chronic small vessel ischemic disease. Calcified atherosclerosis at the skull base. ORBITS: Remote bilateral medial orbital fractures. SINUSES AND MASTOIDS: Mild right ethmoid air cell opacification. Clear mastoid air cells. SOFT TISSUES AND SKULL: No acute skull fracture. No acute soft tissue abnormality. IMPRESSION: 1. No acute intracranial abnormality. 2. Chronic right parietal infarct. 3. Mild chronic small vessel ischemic disease. Electronically signed by: Dasie Hamburg MD 01/30/2024 08:33 AM EST RP Workstation: [id number]   ECHOCARDIOGRAM COMPLETE Result Date: 01/01/2024    ECHOCARDIOGRAM REPORT   Patient Name:   Christopher Farrell Date of Exam: 01/01/2024 Medical Rec #:  [id number]          Height:  64.0 in Accession #:    [id number]         Weight:       137.0 lb Date of Birth:  Aug 25, 1960          BSA:          1.666 m Patient Age:    63 years           BP:           120/70 mmHg Patient Gender: M                  HR:           107 bpm. Exam Location:  [address] Procedure: 2D Echo, Cardiac Doppler and Color Doppler (Both Spectral and Color            Flow Doppler were utilized during procedure). Indications:    Z95.2 S/p MVR  History:        Patient has prior history of Echocardiogram examinations, most                 recent 10/03/2023. H/o endocarditis, S/p MVR; Arrythmias:Atrial                 Flutter and Paroxysmal atrial fibrillation.                  Mitral Valve: 31 mm Medtronic Mosaic bioprosthetic valve valve                 is present in the mitral position. Procedure Date: 09/29/23.  Sonographer:    Elsie Bohr RDCS Referring Phys: [id number] [name]  Sonographer Comments: Technically difficult study due to poor echo windows. Image acquisition challenging due to patient body habitus and Image  acquisition challenging due to respiratory motion. IMPRESSIONS  1. Left ventricular ejection fraction, by estimation, is 60 to 65%. The left ventricle has normal function. The left ventricle has no regional wall motion abnormalities. There is moderate left ventricular hypertrophy. Left ventricular diastolic parameters are indeterminate.  2. Right ventricular systolic function is moderately reduced. The right ventricular size is mildly enlarged. Tricuspid regurgitation signal is inadequate for assessing PA pressure.  3. Left atrial size was mildly dilated.  4. The mitral valve has been repaired/replaced. Trace mitral valve regurgitation. The mean mitral valve gradient is 4.0 mmHg with average heart rate of 102 bpm. There is a 31 mm Medtronic Mosaic bioprosthetic valve present in the mitral position. Procedure Date: 09/29/23. Echo findings are consistent with normal structure and function of the mitral valve prosthesis.  5. The aortic valve is grossly normal. Aortic valve regurgitation is not visualized. No aortic stenosis is present.  6. The inferior vena cava is normal in size with greater than 50% respiratory variability, suggesting right atrial pressure of 3 mmHg. FINDINGS  Left Ventricle: Left ventricular ejection fraction, by estimation, is 60 to 65%. The left ventricle has normal function. The left ventricle has no regional wall motion abnormalities. The left ventricular internal cavity size was normal in size. There is  moderate left ventricular hypertrophy. Left ventricular diastolic parameters are indeterminate. Right Ventricle: The right ventricular size is mildly enlarged. No increase in right ventricular wall thickness. Right ventricular systolic function is moderately reduced. Tricuspid regurgitation signal is inadequate for assessing PA pressure. Left Atrium: Left atrial size was mildly dilated. Right Atrium: Right atrial size was normal in size. Pericardium: There is no evidence of pericardial effusion.  Mitral Valve: The mitral valve has been repaired/replaced.  Trace mitral valve regurgitation. There is a 31 mm Medtronic Mosaic bioprosthetic valve present in the mitral position. Procedure Date: 09/29/23. Echo findings are consistent with normal structure  and function of the mitral valve prosthesis. MV peak gradient, 7.4 mmHg. The mean mitral valve gradient is 4.0 mmHg with average heart rate of 102 bpm. Tricuspid Valve: The tricuspid valve is normal in structure. Tricuspid valve regurgitation is trivial. No evidence of tricuspid stenosis. Aortic Valve: The aortic valve is grossly normal. Aortic valve regurgitation is not visualized. No aortic stenosis is present. Pulmonic Valve: The pulmonic valve was normal in structure. Pulmonic valve regurgitation is trivial. No evidence of pulmonic stenosis. Aorta: The aortic root is normal in size and structure. Venous: The inferior vena cava is normal in size with greater than 50% respiratory variability, suggesting right atrial pressure of 3 mmHg. IAS/Shunts: No atrial level shunt detected by color flow Doppler.  LEFT VENTRICLE PLAX 2D LVIDd:         3.90 cm   Diastology LVIDs:         2.80 cm   LV e' medial:    5.98 cm/s LV PW:         1.00 cm   LV E/e' medial:  17.4 LV IVS:        1.40 cm   LV e' lateral:   6.53 cm/s LVOT diam:     2.00 cm   LV E/e' lateral: 15.9 LV SV:         56 LV SV Index:   34 LVOT Area:     3.14 cm  RIGHT VENTRICLE            IVC RV S prime:     7.94 cm/s  IVC diam: 0.60 cm TAPSE (M-mode): 0.8 cm LEFT ATRIUM             Index        RIGHT ATRIUM           Index LA diam:        3.70 cm 2.22 cm/m   RA Pressure: 3.00 mmHg LA Vol (A2C):   32.7 ml 19.63 ml/m  RA Area:     13.20 cm LA Vol (A4C):   37.1 ml 22.27 ml/m  RA Volume:   32.30 ml  19.39 ml/m LA Biplane Vol: 35.1 ml 21.07 ml/m  AORTIC VALVE LVOT Vmax:   136.00 cm/s LVOT Vmean:  77.800 cm/s LVOT VTI:    0.179 m  AORTA Ao Root diam: 3.40 cm Ao Asc diam:  3.10 cm MITRAL VALVE                 TRICUSPID VALVE MV Area (PHT): 3.85 cm     Estimated RAP:  3.00 mmHg MV Area VTI:   2.54 cm MV Peak grad:  7.4 mmHg     SHUNTS MV Mean grad:  4.0 mmHg     Systemic VTI:  0.18 m MV Vmax:       1.36 m/s     Systemic Diam: 2.00 cm MV Vmean:      92.8 cm/s MV Decel Time: 197 msec MV E velocity: 104.00 cm/s MV A velocity: 122.00 cm/s MV E/A ratio:  0.85 Soyla Merck MD Electronically signed by Soyla Merck MD Signature Date/Time: 01/01/2024/3:15:59 PM    Final    (Echo, Carotid, EGD, Colonoscopy, ERCP)   Subjective:  He denies dyspnea, dizziness or lightheadedness, ambulated in the hallway, no hypoxia or hypotension, he reports cough,  Discharge  Exam: Vitals:   01/31/24 0000 01/31/24 0401  BP: 101/69 94/78  Pulse: 93 94  Resp: (!) 22 19  Temp: 98.1 F (36.7 C) 98.4 F (36.9 C)  SpO2:  98%   Vitals:   01/30/24 2036 01/30/24 2102 01/31/24 0000 01/31/24 0401  BP: 92/75 95/72 101/69 94/78  Pulse: 63  93 94  Resp: 17  (!) 22 19  Temp: 98 F (36.7 C)  98.1 F (36.7 C) 98.4 F (36.9 C)  TempSrc: Oral  Oral Oral  SpO2:    98%  Weight:      Height:        General: Pt is alert, awake, not in acute distress Cardiovascular: RRR, S1/S2 +, no rubs, no gallops Respiratory: CTA bilaterally, no wheezing Abdominal: Soft, NT, ND, bowel sounds + Extremities: no edema, no cyanosis    The results of significant diagnostics from this hospitalization (including imaging, microbiology, ancillary and laboratory) are listed below for reference.     Microbiology: Recent Results (from the past 240 hours)  Resp panel by RT-PCR (RSV, Flu A&B, Covid) Anterior Nasal Swab     Status: None   Collection Time: 01/30/24 12:18 PM   Specimen: Anterior Nasal Swab  Result Value Ref Range Status   SARS Coronavirus 2 by RT PCR NEGATIVE NEGATIVE Final   Influenza A by PCR NEGATIVE NEGATIVE Final   Influenza B by PCR NEGATIVE NEGATIVE Final    Comment: (NOTE) The Xpert Xpress SARS-CoV-2/FLU/RSV plus assay  is intended as an aid in the diagnosis of influenza from Nasopharyngeal swab specimens and should not be used as a sole basis for treatment. Nasal washings and aspirates are unacceptable for Xpert Xpress SARS-CoV-2/FLU/RSV testing.  Fact Sheet for Patients: bloggercourse.com  Fact Sheet for Healthcare Providers: seriousbroker.it  This test is not yet approved or cleared by the [place]  FDA and has been authorized for detection and/or diagnosis of SARS-CoV-2 by FDA under an Emergency Use Authorization (EUA). This EUA will remain in effect (meaning this test can be used) for the duration of the COVID-19 declaration under Section 564(b)(1) of the Act, 21 U.S.C. section 360bbb-3(b)(1), unless the authorization is terminated or revoked.     Resp Syncytial Virus by PCR NEGATIVE NEGATIVE Final    Comment: (NOTE) Fact Sheet for Patients: bloggercourse.com  Fact Sheet for Healthcare Providers: seriousbroker.it  This test is not yet approved or cleared by the [place]  FDA and has been authorized for detection and/or diagnosis of SARS-CoV-2 by FDA under an Emergency Use Authorization (EUA). This EUA will remain in effect (meaning this test can be used) for the duration of the COVID-19 declaration under Section 564(b)(1) of the Act, 21 U.S.C. section 360bbb-3(b)(1), unless the authorization is terminated or revoked.  Performed at [facility], [address] [place], [place] [address]      Labs: BNP (last 3 results) Recent Labs    05/02/23 1005 01/30/24 1951  BNP 733.1* 66.7   Basic Metabolic Panel: Recent Labs  Lab 01/29/24 1842 01/30/24 0855 01/30/24 1500 01/31/24 0240  NA 137 138 133* 137  K 3.7 4.1 3.9 3.6  CL 97*  --  97* 100  CO2 28  --  27 28  GLUCOSE 101*  --  120* 92  BUN 7*  --  6* 9  CREATININE 0.72  --  0.78 0.85  CALCIUM  9.2  --  8.5* 8.3*    Liver Function Tests: Recent Labs  Lab 01/30/24 1500 01/31/24 0240  AST 56*  42*  ALT 36 29  ALKPHOS 63 62  BILITOT 0.3 0.6  PROT 6.3* 5.7*  ALBUMIN  2.4* 2.2*   No results for input(s): LIPASE, AMYLASE in the last 168 hours. No results for input(s): AMMONIA in the last 168 hours. CBC: Recent Labs  Lab 01/29/24 1842 01/30/24 0855 01/30/24 1500 01/31/24 0240  WBC 6.5  --  6.3 6.6  NEUTROABS 3.6  --   --   --   HGB 14.3 15.3 13.1 12.0*  HCT 43.4 45.0 39.3 36.5*  MCV 88.0  --  88.7 88.8  PLT 231  --  196 182   Cardiac Enzymes: No results for input(s): CKTOTAL, CKMB, CKMBINDEX, TROPONINI in the last 168 hours. BNP: Invalid input(s): POCBNP CBG: No results for input(s): GLUCAP in the last 168 hours. D-Dimer Recent Labs    01/30/24 0820  DDIMER 3.15*   Hgb A1c No results for input(s): HGBA1C in the last 72 hours. Lipid Profile No results for input(s): CHOL, HDL, LDLCALC, TRIG, CHOLHDL, LDLDIRECT in the last 72 hours. Thyroid  function studies Recent Labs    01/30/24 0820  TSH 0.696   Anemia work up No results for input(s): VITAMINB12, FOLATE, FERRITIN, TIBC, IRON, RETICCTPCT in the last 72 hours. Urinalysis    Component Value Date/Time   COLORURINE AMBER (A) 01/29/2024 1843   APPEARANCEUR HAZY (A) 01/29/2024 1843   LABSPEC 1.023 01/29/2024 1843   PHURINE 6.0 01/29/2024 1843   GLUCOSEU NEGATIVE 01/29/2024 1843   GLUCOSEU NEG mg/dL 93/78/7989 7752   HGBUR NEGATIVE 01/29/2024 1843   BILIRUBINUR NEGATIVE 01/29/2024 1843   KETONESUR NEGATIVE 01/29/2024 1843   PROTEINUR 30 (A) 01/29/2024 1843   UROBILINOGEN 0.2 08/25/2008 2247   NITRITE NEGATIVE 01/29/2024 1843   LEUKOCYTESUR NEGATIVE 01/29/2024 1843   Sepsis Labs Recent Labs  Lab 01/29/24 1842 01/30/24 1500 01/31/24 0240  WBC 6.5 6.3 6.6   Microbiology Recent Results (from the past 240 hours)  Resp panel by RT-PCR (RSV, Flu A&B, Covid) Anterior Nasal Swab      Status: None   Collection Time: 01/30/24 12:18 PM   Specimen: Anterior Nasal Swab  Result Value Ref Range Status   SARS Coronavirus 2 by RT PCR NEGATIVE NEGATIVE Final   Influenza A by PCR NEGATIVE NEGATIVE Final   Influenza B by PCR NEGATIVE NEGATIVE Final    Comment: (NOTE) The Xpert Xpress SARS-CoV-2/FLU/RSV plus assay is intended as an aid in the diagnosis of influenza from Nasopharyngeal swab specimens and should not be used as a sole basis for treatment. Nasal washings and aspirates are unacceptable for Xpert Xpress SARS-CoV-2/FLU/RSV testing.  Fact Sheet for Patients: bloggercourse.com  Fact Sheet for Healthcare Providers: seriousbroker.it  This test is not yet approved or cleared by the [place]  FDA and has been authorized for detection and/or diagnosis of SARS-CoV-2 by FDA under an Emergency Use Authorization (EUA). This EUA will remain in effect (meaning this test can be used) for the duration of the COVID-19 declaration under Section 564(b)(1) of the Act, 21 U.S.C. section 360bbb-3(b)(1), unless the authorization is terminated or revoked.     Resp Syncytial Virus by PCR NEGATIVE NEGATIVE Final    Comment: (NOTE) Fact Sheet for Patients: bloggercourse.com  Fact Sheet for Healthcare Providers: seriousbroker.it  This test is not yet approved or cleared by the [place]  FDA and has been authorized for detection and/or diagnosis of SARS-CoV-2 by FDA under an Emergency Use Authorization (EUA). This EUA will remain in effect (meaning this test can be used) for the  duration of the COVID-19 declaration under Section 564(b)(1) of the Act, 21 U.S.C. section 360bbb-3(b)(1), unless the authorization is terminated or revoked.  Performed at [facility], [address] [place], [place] [address]      Time coordinating discharge: Over 30  minutes  SIGNED:   Brayton Lye, MD  [facility] 01/31/2024, 11:17 AM Pager   If 7PM-7AM, please contact night-coverage

## 2024-01-31 NOTE — TOC Transition Note (Signed)
 Transition of Care [facility]) - Discharge Note   Patient Details  Name: Christopher Farrell MRN: [id number] Date of Birth: 05-30-1960  [facility] CM/SW Contact:  Landry DELENA Senters, RN Phone Number: 01/31/2024, 11:41 AM   Clinical Narrative:     Patient lives at home with mother. Reports she will be providing transportation home today at d/c. Patient has PCP, manages own medications, does not drive but always has someone available for transportation needs.   No needs identified by CM.  Final next level of care: Home/Self Care Barriers to Discharge: No Barriers Identified   Patient Goals and CMS Choice            Discharge Placement                       Discharge Plan and Services Additional resources added to the After Visit Summary for                                       Social Drivers of Health (SDOH) Interventions SDOH Screenings   Food Insecurity: No Food Insecurity (01/30/2024)  Housing: Low Risk  (01/30/2024)  Transportation Needs: Unmet Transportation Needs (01/30/2024)  Utilities: Not At Risk (01/30/2024)  Depression (PHQ2-9): Low Risk  (12/05/2023)  Tobacco Use: High Risk (01/29/2024)     Readmission Risk Interventions    10/06/2023    1:36 PM 10/06/2022   11:54 AM  Readmission Risk Prevention Plan  Post Dischage Appt Complete   Medication Screening Complete   Transportation Screening Complete Complete  PCP or Specialist Appt within 5-7 Days  Complete  Home Care Screening  Complete  Medication Review (RN CM)  Complete

## 2024-01-31 NOTE — Progress Notes (Addendum)
   01/31/24 1057  Mobility  Activity Ambulated with assistance  Level of Assistance Contact guard assist, steadying assist  Assistive Device None  Distance Ambulated (ft) 150 ft  Activity Response Tolerated fair  Mobility Referral Yes  Mobility visit 1 Mobility  Mobility Specialist Start Time (ACUTE ONLY) 1057  Mobility Specialist Stop Time (ACUTE ONLY) 1107  Mobility Specialist Time Calculation (min) (ACUTE ONLY) 10 min   Mobility Specialist: Progress Note  During Mobility: HR 106 Post-Mobility:    HR 97, SpO2 95% RA  Pt agreeable to mobility session - received in bed. Pt was asymptomatic throughout session with no complaints. Returned to bed with all needs met - call bell within reach.   Additional comments: Pt with drifting gait unsure if this is his baseline.   Christopher Farrell, BS Mobility Specialist Please contact via SecureChat or  Rehab office at [phone].

## 2024-02-01 LAB — HIV-1 RNA QUANT-NO REFLEX-BLD
HIV 1 RNA Quant: 38400 {copies}/mL
LOG10 HIV-1 RNA: 4.584 {Log_copies}/mL

## 2024-02-05 ENCOUNTER — Telehealth: Payer: Self-pay

## 2024-02-05 NOTE — Transitions of Care (Post Inpatient/ED Visit) (Signed)
   02/05/2024  Name: Christopher Farrell MRN: [id number] DOB: October 03, 1960  Today's TOC FU Call Status: Today's TOC FU Call Status:: Unsuccessful Call (1st Attempt) Unsuccessful Call (1st Attempt) Date: 02/05/24  Attempted to reach the patient regarding the most recent Inpatient/ED visit.  Follow Up Plan: Additional outreach attempts will be made to reach the patient to complete the Transitions of Care (Post Inpatient/ED visit) call.   Signature  Charmaine Bloodgood, LPN [facility] Advisor [facility] You Are. We Are. [facility] Direct Dial [phone]

## 2024-02-13 NOTE — Progress Notes (Signed)
 Agree with the assessment and plan as outlined by Elida Shawl, NP.  Patient was subsequently admitted to the hospital for suspected aspiration pneumonia.  UDS positive for cocaine and THC which may have contributed to his aspiration risk.  Ok to proceed with colonoscopy pending cardiology clearance.  Would wait at least 4-6 weeks following pneumonia.  Patient should be aware recent cocaine use will increase his risk of cardiopulmonary complications for sedation and his procedure may be cancelled if he has used cocaine in the period leading up to his procedure.  He should abstain from illicit drug use.   Zed Wanninger E. Stacia, MD [facility]

## 2024-02-15 NOTE — Telephone Encounter (Signed)
 Patient has since been discharged from the hospital - do you want me to go ahead and try to pursue cardiac clearance again?  Wasn't sure if he was in any shape to schedule a procedure yet.

## 2024-02-16 NOTE — Telephone Encounter (Signed)
 OV preop clearance appt now scheduled. Pt agreed to appt date, location and time.

## 2024-02-16 NOTE — Telephone Encounter (Signed)
° °  Name: Christopher Farrell  DOB: 04-10-1960  MRN: [id number]  Primary Cardiologist: Annabella Scarce, MD  Chart reviewed as part of pre-operative protocol coverage. Because of Christopher Farrell past medical history and time since last visit, he will require a follow-up in-office visit in order to better assess preoperative cardiovascular risk.   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   Pre-op covering staff: - Please schedule appointment and call patient to inform them. If patient already had an upcoming appointment within acceptable timeframe, please add pre-op clearance to the appointment notes so provider is aware. - Please contact requesting surgeon's office via preferred method (i.e, phone, fax) to inform them of need for appointment prior to surgery.  This message will also be routed to pharmacy pool for input on holding Eliquis  as requested below so that this information is available to the clearing provider at time of patient's appointment.   Lamarr Satterfield, NP  02/16/2024, 10:38 AM

## 2024-02-19 NOTE — Telephone Encounter (Signed)
 Please review the information we received regarding his Eliquis  and cardiac clearance and let me know if you are comfortable with me now scheduling his procedure.  Thanks!

## 2024-02-19 NOTE — Telephone Encounter (Signed)
 Patient with diagnosis of afib on Eliquis  for anticoagulation.    Procedure: colonoscopy Date of procedure: TBD   CHA2DS2-VASc Score = 3   This indicates a 3.2% annual risk of stroke. 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 He has had a left atrial appendage ligation.   CrCl 66 ml/min Platelet count 182  Patient has not had an Afib/aflutter ablation in the last 3 months, DCCV within the last 4 weeks or a watchman implanted in the last 45 days   Per office protocol, patient can hold Eliquis  for 2 days prior to procedure.    **This guidance is not considered finalized until pre-operative APP has relayed final recommendations.**

## 2024-02-19 NOTE — Telephone Encounter (Signed)
 SABRA

## 2024-02-20 ENCOUNTER — Ambulatory Visit: Payer: Self-pay | Admitting: Nurse Practitioner

## 2024-02-20 ENCOUNTER — Other Ambulatory Visit: Payer: Self-pay | Admitting: Nurse Practitioner

## 2024-02-20 ENCOUNTER — Encounter: Payer: Self-pay | Admitting: Nurse Practitioner

## 2024-02-20 ENCOUNTER — Ambulatory Visit
Admission: RE | Admit: 2024-02-20 | Discharge: 2024-02-20 | Disposition: A | Source: Ambulatory Visit | Attending: Nurse Practitioner

## 2024-02-20 VITALS — BP 106/86 | HR 102 | Wt 136.0 lb

## 2024-02-20 DIAGNOSIS — F17200 Nicotine dependence, unspecified, uncomplicated: Secondary | ICD-10-CM

## 2024-02-20 DIAGNOSIS — Z9889 Other specified postprocedural states: Secondary | ICD-10-CM

## 2024-02-20 DIAGNOSIS — B20 Human immunodeficiency virus [HIV] disease: Secondary | ICD-10-CM

## 2024-02-20 DIAGNOSIS — F1990 Other psychoactive substance use, unspecified, uncomplicated: Secondary | ICD-10-CM

## 2024-02-20 DIAGNOSIS — I4891 Unspecified atrial fibrillation: Secondary | ICD-10-CM

## 2024-02-20 DIAGNOSIS — I2510 Atherosclerotic heart disease of native coronary artery without angina pectoris: Secondary | ICD-10-CM

## 2024-02-20 DIAGNOSIS — I50812 Chronic right heart failure: Secondary | ICD-10-CM

## 2024-02-20 DIAGNOSIS — J690 Pneumonitis due to inhalation of food and vomit: Secondary | ICD-10-CM

## 2024-02-20 DIAGNOSIS — I959 Hypotension, unspecified: Secondary | ICD-10-CM

## 2024-02-20 DIAGNOSIS — J432 Centrilobular emphysema: Secondary | ICD-10-CM

## 2024-02-20 DIAGNOSIS — Z8673 Personal history of transient ischemic attack (TIA), and cerebral infarction without residual deficits: Secondary | ICD-10-CM

## 2024-02-20 MED ORDER — ASPIRIN 81 MG PO TBEC: 81.0000 mg | DELAYED_RELEASE_TABLET | Freq: Every day | ORAL | 12 refills | Status: AC

## 2024-02-20 MED ORDER — FUROSEMIDE 40 MG PO TABS: 40.0000 mg | ORAL_TABLET | Freq: Every day | ORAL | 1 refills | Status: AC

## 2024-02-20 MED ORDER — SPIRONOLACTONE 25 MG PO TABS: 12.5000 mg | ORAL_TABLET | Freq: Every day | ORAL | 1 refills | Status: AC

## 2024-02-20 MED ORDER — POTASSIUM CHLORIDE CRYS ER 20 MEQ PO TBCR: 20.0000 meq | EXTENDED_RELEASE_TABLET | Freq: Every day | ORAL | 1 refills | Status: AC

## 2024-02-20 MED ORDER — APIXABAN 5 MG PO TABS: 5.0000 mg | ORAL_TABLET | Freq: Two times a day (BID) | ORAL | 3 refills | Status: AC

## 2024-02-20 MED ORDER — BICTEGRAVIR-EMTRICITAB-TENOFOV 50-200-25 MG PO TABS: 1.0000 | ORAL_TABLET | Freq: Every day | ORAL | 1 refills | Status: AC

## 2024-02-20 MED ORDER — MIDODRINE HCL 5 MG PO TABS: 5.0000 mg | ORAL_TABLET | Freq: Three times a day (TID) | ORAL | 2 refills | Status: AC

## 2024-02-20 NOTE — Assessment & Plan Note (Signed)
 Lab Results  Component Value Date   CHOL 123 12/05/2023   HDL 73 12/05/2023   LDLCALC 33 12/05/2023   LDLDIRECT 39 10/26/2022   TRIG 87 12/05/2023   CHOLHDL 1.7 12/05/2023  Not on a statin but LDL is well-controlled Continue aspirin  81 mg daily

## 2024-02-20 NOTE — Assessment & Plan Note (Signed)
 BP Readings from Last 3 Encounters:  02/20/24 106/86  01/31/24 (!) 119/91  01/29/24 103/74  Continue furosemide  40 mg daily, spironolactone  12.5 mg daily Maintain close follow-up with cardiology

## 2024-02-20 NOTE — Progress Notes (Signed)
 Linda/Dottie:  Pls contact patient and let him know Dr. London recommendations in his addendum note below.  Await cardiac clearance before scheduling patient for a colonoscopy and colonoscopy date to be at least 4 to 6 weeks from his hospital discharged date.

## 2024-02-20 NOTE — Assessment & Plan Note (Signed)
°  Increased risk of recurrent stroke due to smoking and potential blood pressure issues. Emphasized smoking cessation and blood pressure control. - Encouraged smoking cessation. - Ensured blood pressure is well-controlled. Continue aspirin  81 mg daily

## 2024-02-20 NOTE — Assessment & Plan Note (Signed)
-   Continue midodrine 5 mg 3 times daily

## 2024-02-20 NOTE — Assessment & Plan Note (Signed)
 Tobacco use disorder Continues smoking. . Encouraged cessation.

## 2024-02-20 NOTE — Assessment & Plan Note (Signed)
 Need to avoid using illicit drugs discussed

## 2024-02-20 NOTE — Assessment & Plan Note (Addendum)
 A-fib Not on rate or rhythm control medication Continue Eliquis  5 mg twice daily, med managed by cardiology

## 2024-02-20 NOTE — Assessment & Plan Note (Signed)
°  Recent hospitalization treated with IV antibiotics. No current respiratory symptoms. - Ordered chest x-ray to assess resolution of pneumonia.

## 2024-02-20 NOTE — Patient Instructions (Addendum)
 Please call [phone] to schedule an appointment with the Infectious diseases specialist   Please call the office if your blood pressure is less than 100/60 before you take your medications   Around 3 times per week, check your blood pressure 2 times per day. once in the morning and once in the evening. The readings should be at least one minute apart. Write down these values and bring them to your next nurse visit/appointment.  When you check your BP, make sure you have been doing something calm/relaxing 5 minutes prior to checking. Both feet should be flat on the floor and you should be sitting. Use your left arm and make sure it is in a relaxed position (on a table), and that the cuff is at the approximate level/height of your heart.  Blood pressure goal is less than 130/80  Please get your x-ray done at [facility] Address: [address] [place], [place], [place] [address] Phone: [phone]    It is important that you exercise regularly at least 30 minutes 5 times a week as tolerated  Think about what you will eat, plan ahead. Choose  clean, green, fresh or frozen over canned, processed or packaged foods which are more sugary, salty and fatty. 70 to 75% of food eaten should be vegetables and fruit. Three meals at set times with snacks allowed between meals, but they must be fruit or vegetables. Aim to eat over a 12 hour period , example 7 am to 7 pm, and STOP after  your last meal of the day. Drink water ,generally about 64 ounces per day, no other drink is as healthy. Fruit juice is best enjoyed in a healthy way, by EATING the fruit.  Thanks for choosing [facility] we consider it a privelige to serve you.

## 2024-02-20 NOTE — Assessment & Plan Note (Signed)
 HIV infection Managed with Biktarvy . Uncertain medication adherence. Last follow-up over a year ago. Emphasized adherence to prevent progression. - Referred to clinical pharmacy for medication adherence support. - Instructed to schedule follow-up with infectious disease specialist. Refilled Biktarvy 
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
## 2024-02-20 NOTE — Addendum Note (Signed)
 Addended by: JUANICE THOMES SAUNDERS on: 02/20/2024 08:26 PM   Modules accepted: Orders

## 2024-02-20 NOTE — Assessment & Plan Note (Signed)
 Centrilobular emphysema Continues smoking. Advised on risks including lung cancer, COPD, heart disease. Encouraged cessation. - Encouraged smoking cessation.  Continue albuterol  inhaler 2 puffs every 6 hours as needed

## 2024-02-22 NOTE — Progress Notes (Signed)
 Await cardiac clearance, patient has cardiology appt on 02/27/2024. Once cardiac clearance received, patient will be contacted to schedule a colonoscopy at least 4 to 6 weeks from his hospital discharge date.

## 2024-02-22 NOTE — Telephone Encounter (Signed)
 Christopher Farrell, we do not have cardiac clearance therefore do not schedule patient for a colonoscopy at this time. Patient is scheduled to see his cardiologist on 02/27/2024.    Once cardiac clearance received, procedure will be scheduled at least 4 to 6 weeks following his recent hospital admission with pneumonia.  Refer to Dr. London addendum to lasts office visit note. THX.

## 2024-02-23 ENCOUNTER — Ambulatory Visit: Payer: Self-pay | Admitting: Nurse Practitioner

## 2024-02-26 NOTE — Progress Notes (Deleted)
 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  He completed the course of Augmentin  and prednisone  upon discharge but continues to have regular cough.  No shortness of breath, fever, chills, or wheezing.  History of low blood pressure was started on midodrine  5 mg 3 times daily during hospitalization.  He does not routinely check BP at home.  He reported continued drug use with recent urine test showing cocaine and cannabis.  Generally smokes 2 to 3 cigarettes/day.  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  Brain MRI at that time showed multifocal acute/subacute ischemia.  At follow-up visit 04/2023 he had missed his follow-up with CT surgery.  Repeat echo 08/2023 showed EF 58%, G3 DD, flattened IVS suggestive of RV pressure overload, enlarged RV, severely elevated PASP, severe MR, moderate to severe TR, dilated IVC, cannot exclude small PFO.  He underwent bioprosthetic MVR and TV repair with clipping of LAA on 09/29/2023.  Followed by AHF team after surgery who felt pulmonary HTN/RV failure was driven by severe MR.  Hospitalization was complicated by conduction system disease requiring EP involvement (documented to have had CHB, accelerated junctional,  intermittent Orest Elders, and first-degree AVB).  Development of atrial fibrillation noted 7/31.  Patient was discharged on Eliquis  and aspirin  and 2-week ZIO was placed.  Postop CXR 10/30/2023 was stable.  Seen in clinic on 11/15/2023 by Lonell Bring, PA.  ZIO report indicated mostly sinus rhythm, some first-degree AV block, SVE's and VE's.  4 runs of V. tach longest run 16 beats up to 207 bpm.  He was feeling well.  SBE prophylaxis was emphasized.  He was not having symptoms concerning for volume overload.  LDL historically low.  Seen by Dr. Holts 11/16/2023.  Cardiac monitor showed no evidence of advanced heart block and he remained asymptomatic.  He was advised to continue Eliquis  given mitral valve replacement.  TTE completed 01/01/2024 revealed normal LVEF 60 to 65%, no RWMA, moderate LVH, indeterminate diastolic parameters, moderately reduced RV function, mildly dilated LA, trace mitral valve regurgitation with mitral valve prosthesis functioning well.  History of Present Illness JADAN HINOJOS is a 63 y.o. male who is here for preoperative cardiac evaluation for upcoming colonoscopy  ROS: ***  Studies Reviewed      ***  No results found for: LIPOA  Risk Assessment/Calculations {Does this patient have ATRIAL FIBRILLATION?:[phone]} No BP recorded.  {Refresh Note OR Click here to enter BP  :1}***       Physical Exam VS:  There were no vitals taken for this visit.   Wt Readings from Last 3 Encounters:  02/20/24 136 lb (61.7 kg)  01/29/24 116 lb (52.6 kg)  01/29/24 116 lb (52.6 kg)    GEN: Well nourished, well developed in no acute distress NECK: No JVD; No carotid bruits CARDIAC: ***RRR, no murmurs, rubs, gallops RESPIRATORY:  Clear to  auscultation without rales, wheezing or rhonchi  ABDOMEN: Soft, non-tender, non-distended EXTREMITIES:  No edema; No deformity   ASSESSMENT AND PLAN ***    {Are you ordering a CV Procedure (e.g. stress test, cath, DCCV, TEE, etc)?    Press F2        :[phone]}  Dispo: ***  Signed, Rosaline Bane, NP-C "

## 2024-02-27 ENCOUNTER — Ambulatory Visit (HOSPITAL_BASED_OUTPATIENT_CLINIC_OR_DEPARTMENT_OTHER): Admitting: Nurse Practitioner

## 2024-02-27 NOTE — Telephone Encounter (Signed)
 Per cardiology will await for patient to contact them to reschedule pre-op appt.

## 2024-02-27 NOTE — Telephone Encounter (Signed)
 Patient has no-showed 02/27/24 preop clearance appt with Rosaline Bane NP and will need to call our office and reschedule in office appt. Thank you!! (Will also route to requesting surgeon's office)

## 2024-03-05 ENCOUNTER — Telehealth: Payer: Self-pay

## 2024-03-05 NOTE — Progress Notes (Signed)
 Care Guide Pharmacy Note  03/05/2024 Name: Christopher Farrell MRN: [id number] DOB: 1961/03/05  Referred By: Paseda, Folashade R, FNP Reason for referral: Complex Care Management and Call Attempt #1 (Successful initial outreach scheduled with PHARM D- Layna)   Christopher Farrell is a 63 y.o. year old male who is a primary care patient of Paseda, Folashade R, FNP.  Christopher Farrell was referred to the pharmacist for assistance related to: Heart Failure  04/23/24 @ 9 AM.  Christopher Farrell [facility], [facility] Guide  Direct Dial: [phone]  Fax [phone]

## 2024-04-05 ENCOUNTER — Ambulatory Visit: Payer: Self-pay | Admitting: Nurse Practitioner

## 2024-04-05 ENCOUNTER — Encounter: Payer: Self-pay | Admitting: Nurse Practitioner

## 2024-04-05 VITALS — BP 93/67 | HR 108 | Wt 143.0 lb

## 2024-04-05 DIAGNOSIS — I959 Hypotension, unspecified: Secondary | ICD-10-CM | POA: Diagnosis not present

## 2024-04-05 DIAGNOSIS — D638 Anemia in other chronic diseases classified elsewhere: Secondary | ICD-10-CM

## 2024-04-05 DIAGNOSIS — Z8673 Personal history of transient ischemic attack (TIA), and cerebral infarction without residual deficits: Secondary | ICD-10-CM

## 2024-04-05 DIAGNOSIS — F17200 Nicotine dependence, unspecified, uncomplicated: Secondary | ICD-10-CM

## 2024-04-05 DIAGNOSIS — B20 Human immunodeficiency virus [HIV] disease: Secondary | ICD-10-CM | POA: Diagnosis not present

## 2024-04-05 DIAGNOSIS — Z91199 Patient's noncompliance with other medical treatment and regimen due to unspecified reason: Secondary | ICD-10-CM | POA: Insufficient documentation

## 2024-04-05 DIAGNOSIS — Z125 Encounter for screening for malignant neoplasm of prostate: Secondary | ICD-10-CM

## 2024-04-05 DIAGNOSIS — I4891 Unspecified atrial fibrillation: Secondary | ICD-10-CM

## 2024-04-05 DIAGNOSIS — I50812 Chronic right heart failure: Secondary | ICD-10-CM | POA: Diagnosis not present

## 2024-04-05 NOTE — Assessment & Plan Note (Signed)
"   Smoking cessation encouraged      "

## 2024-04-05 NOTE — Assessment & Plan Note (Signed)
 Rechecking labs Lab Results  Component Value Date   WBC 6.6 01/31/2024   HGB 12.0 (L) 01/31/2024   HCT 36.5 (L) 01/31/2024   MCV 88.8 01/31/2024   PLT 182 01/31/2024

## 2024-04-05 NOTE — Assessment & Plan Note (Deleted)
 Take aspirin  81 mg daily Smoking cessation encouraged Not on a statin but LDL is well-controlled Lab Results  Component Value Date   CHOL 123 12/05/2023   HDL 73 12/05/2023   LDLCALC 33 12/05/2023   LDLDIRECT 39 10/26/2022   TRIG 87 12/05/2023   CHOLHDL 1.7 12/05/2023

## 2024-04-05 NOTE — Assessment & Plan Note (Addendum)
 Continue aspirin  81 mg daily Not on any statin blood LDL is well-controlled Smoking cessation encouraged Lab Results  Component Value Date   CHOL 123 12/05/2023   HDL 73 12/05/2023   LDLCALC 33 12/05/2023   LDLDIRECT 39 10/26/2022   TRIG 87 12/05/2023   CHOLHDL 1.7 12/05/2023

## 2024-04-05 NOTE — Assessment & Plan Note (Signed)
 Advised to take Eliquis  5 mg twice daily as ordered

## 2024-04-05 NOTE — Patient Instructions (Addendum)
 [facility] [employer] [facility] [employer]. [facility] [phone]   It is important that you exercise regularly at least 30 minutes 5 times a week as tolerated  Think about what you will eat, plan ahead. Choose  clean, green, fresh or frozen over canned, processed or packaged foods which are more sugary, salty and fatty. 70 to 75% of food eaten should be vegetables and fruit. Three meals at set times with snacks allowed between meals, but they must be fruit or vegetables. Aim to eat over a 12 hour period , example 7 am to 7 pm, and STOP after  your last meal of the day. Drink water ,generally about 64 ounces per day, no other drink is as healthy. Fruit juice is best enjoyed in a healthy way, by EATING the fruit.  Thanks for choosing [facility] we consider it a privelige to serve you.

## 2024-04-05 NOTE — Assessment & Plan Note (Signed)
 He has been referred to the clinical pharmacist

## 2024-04-05 NOTE — Assessment & Plan Note (Signed)
 Human immunodeficiency virus (HIV) infection HIV managed with Biktarvy . No recent infectious disease follow-up. Medication adherence uncertain. - Scheduled appointment with infectious disease specialist.  Advised to keep the appointment - Ensure adherence to Biktarvy  50-200-25 mg daily.

## 2024-04-05 NOTE — Assessment & Plan Note (Signed)
 Take midodrine  5 mg 3 times daily as ordered      04/05/2024    8:27 AM 02/20/2024    9:52 AM 01/31/2024   12:39 PM 01/31/2024    4:01 AM 01/31/2024   12:00 AM 01/30/2024    9:02 PM 01/30/2024    8:36 PM  BP/Weight  Systolic BP 93 106 119 94 101 95 92  Diastolic BP 67 86 91 78 69 72 75  Wt. (Lbs) 143 136       BMI 24.55 kg/m2 23.34 kg/m2

## 2024-04-05 NOTE — Assessment & Plan Note (Signed)
 Followed by cardiology . Missed his last appointment patient advised to reschedule the appointment Take spironolactone  12.5 mg daily, furosemide  40 mg daily as ordered
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
## 2024-04-06 LAB — BASIC METABOLIC PANEL WITH GFR
BUN/Creatinine Ratio: 15 (ref 10–24)
BUN: 15 mg/dL (ref 8–27)
CO2: 25 mmol/L (ref 20–29)
Calcium: 9.6 mg/dL (ref 8.6–10.2)
Chloride: 104 mmol/L (ref 96–106)
Creatinine, Ser: 1 mg/dL (ref 0.76–1.27)
Glucose: 85 mg/dL (ref 70–99)
Potassium: 3.8 mmol/L (ref 3.5–5.2)
Sodium: 144 mmol/L (ref 134–144)
eGFR: 85 mL/min/{1.73_m2}

## 2024-04-06 LAB — CBC
Hematocrit: 41.1 % (ref 37.5–51.0)
Hemoglobin: 13.7 g/dL (ref 13.0–17.7)
MCH: 30.4 pg (ref 26.6–33.0)
MCHC: 33.3 g/dL (ref 31.5–35.7)
MCV: 91 fL (ref 79–97)
Platelets: 190 10*3/uL (ref 150–450)
RBC: 4.5 x10E6/uL (ref 4.14–5.80)
RDW: 15 % (ref 11.6–15.4)
WBC: 7.6 10*3/uL (ref 3.4–10.8)

## 2024-04-06 LAB — PSA: Prostate Specific Ag, Serum: 0.4 ng/mL (ref 0.0–4.0)

## 2024-04-08 ENCOUNTER — Ambulatory Visit: Payer: Self-pay | Admitting: Nurse Practitioner

## 2024-04-08 ENCOUNTER — Telehealth: Payer: Self-pay

## 2024-04-08 ENCOUNTER — Other Ambulatory Visit (HOSPITAL_COMMUNITY): Payer: Self-pay

## 2024-04-08 NOTE — Telephone Encounter (Signed)
 Pharmacy Patient Advocate Encounter  Insurance verification completed.   The patient is insured through [employer]   Ran test claim for Biktarvy . Currently a quantity of 30 is a 30 day supply and the co-pay is 0.00 . [place] $0.00  This test claim was processed through [facility]- copay amounts may vary at other pharmacies due to pharmacy/plan contracts, or as the patient moves through the different stages of their insurance plan.

## 2024-04-11 ENCOUNTER — Ambulatory Visit: Payer: Self-pay | Admitting: Internal Medicine

## 2024-04-22 ENCOUNTER — Ambulatory Visit: Payer: Self-pay | Admitting: Nurse Practitioner

## 2024-04-23 ENCOUNTER — Ambulatory Visit: Payer: Self-pay

## 2024-07-05 ENCOUNTER — Ambulatory Visit: Payer: Self-pay | Admitting: Nurse Practitioner
